# Patient Record
Sex: Male | Born: 1935 | Race: Black or African American | Hispanic: No | Smoking: Never smoker
Health system: Southern US, Community
[De-identification: ages and names within clinical notes are randomized; demographics above are authoritative.]

## PROBLEM LIST (undated history)

## (undated) ENCOUNTER — Emergency Department (HOSPITAL_COMMUNITY): Admission: EM | Payer: Self-pay | Source: Home / Self Care

## (undated) ENCOUNTER — Emergency Department (HOSPITAL_COMMUNITY): Payer: Non-veteran care | Source: Home / Self Care

## (undated) DIAGNOSIS — J189 Pneumonia, unspecified organism: Secondary | ICD-10-CM

## (undated) DIAGNOSIS — I509 Heart failure, unspecified: Secondary | ICD-10-CM

## (undated) DIAGNOSIS — E7800 Pure hypercholesterolemia, unspecified: Secondary | ICD-10-CM

## (undated) DIAGNOSIS — I10 Essential (primary) hypertension: Secondary | ICD-10-CM

## (undated) DIAGNOSIS — N200 Calculus of kidney: Secondary | ICD-10-CM

## (undated) DIAGNOSIS — C8590 Non-Hodgkin lymphoma, unspecified, unspecified site: Secondary | ICD-10-CM

## (undated) DIAGNOSIS — I499 Cardiac arrhythmia, unspecified: Secondary | ICD-10-CM

## (undated) HISTORY — PX: BACK SURGERY: SHX140

## (undated) HISTORY — DX: Pneumonia, unspecified organism: J18.9

## (undated) HISTORY — PX: HERNIA REPAIR: SHX51

## (undated) HISTORY — PX: JOINT REPLACEMENT: SHX530

## (undated) HISTORY — DX: Non-Hodgkin lymphoma, unspecified, unspecified site: C85.90

---

## 1992-09-01 HISTORY — PX: PARTIAL GASTRECTOMY: SHX2172

## 1998-10-04 ENCOUNTER — Encounter (HOSPITAL_BASED_OUTPATIENT_CLINIC_OR_DEPARTMENT_OTHER): Payer: Self-pay | Admitting: General Surgery

## 1998-10-09 ENCOUNTER — Inpatient Hospital Stay (HOSPITAL_COMMUNITY): Admission: RE | Admit: 1998-10-09 | Discharge: 1998-10-14 | Payer: Self-pay | Admitting: General Surgery

## 1998-10-11 ENCOUNTER — Encounter (HOSPITAL_BASED_OUTPATIENT_CLINIC_OR_DEPARTMENT_OTHER): Payer: Self-pay | Admitting: General Surgery

## 1999-05-29 ENCOUNTER — Emergency Department (HOSPITAL_COMMUNITY): Admission: EM | Admit: 1999-05-29 | Discharge: 1999-05-29 | Payer: Self-pay | Admitting: Emergency Medicine

## 1999-05-29 ENCOUNTER — Encounter: Payer: Self-pay | Admitting: Emergency Medicine

## 2000-02-17 ENCOUNTER — Encounter: Payer: Self-pay | Admitting: Emergency Medicine

## 2000-02-17 ENCOUNTER — Emergency Department (HOSPITAL_COMMUNITY): Admission: EM | Admit: 2000-02-17 | Discharge: 2000-02-17 | Payer: Self-pay | Admitting: Emergency Medicine

## 2002-01-06 ENCOUNTER — Ambulatory Visit (HOSPITAL_COMMUNITY): Admission: RE | Admit: 2002-01-06 | Discharge: 2002-01-06 | Payer: Self-pay | Admitting: Pulmonary Disease

## 2002-01-06 ENCOUNTER — Encounter: Payer: Self-pay | Admitting: Pulmonary Disease

## 2002-02-07 ENCOUNTER — Emergency Department (HOSPITAL_COMMUNITY): Admission: EM | Admit: 2002-02-07 | Discharge: 2002-02-07 | Payer: Self-pay

## 2002-03-01 ENCOUNTER — Ambulatory Visit (HOSPITAL_COMMUNITY): Admission: RE | Admit: 2002-03-01 | Discharge: 2002-03-01 | Payer: Self-pay | Admitting: Pulmonary Disease

## 2003-02-12 ENCOUNTER — Emergency Department (HOSPITAL_COMMUNITY): Admission: EM | Admit: 2003-02-12 | Discharge: 2003-02-12 | Payer: Self-pay | Admitting: Emergency Medicine

## 2003-05-02 ENCOUNTER — Ambulatory Visit (HOSPITAL_COMMUNITY): Admission: RE | Admit: 2003-05-02 | Discharge: 2003-05-02 | Payer: Self-pay | Admitting: General Surgery

## 2003-05-02 ENCOUNTER — Encounter (HOSPITAL_BASED_OUTPATIENT_CLINIC_OR_DEPARTMENT_OTHER): Payer: Self-pay | Admitting: General Surgery

## 2003-05-21 ENCOUNTER — Encounter (HOSPITAL_BASED_OUTPATIENT_CLINIC_OR_DEPARTMENT_OTHER): Payer: Self-pay | Admitting: General Surgery

## 2003-05-21 ENCOUNTER — Inpatient Hospital Stay (HOSPITAL_COMMUNITY): Admission: RE | Admit: 2003-05-21 | Discharge: 2003-05-28 | Payer: Self-pay | Admitting: General Surgery

## 2003-05-24 ENCOUNTER — Encounter (HOSPITAL_BASED_OUTPATIENT_CLINIC_OR_DEPARTMENT_OTHER): Payer: Self-pay | Admitting: General Surgery

## 2003-05-25 ENCOUNTER — Encounter (HOSPITAL_BASED_OUTPATIENT_CLINIC_OR_DEPARTMENT_OTHER): Payer: Self-pay | Admitting: General Surgery

## 2003-06-22 ENCOUNTER — Emergency Department (HOSPITAL_COMMUNITY): Admission: EM | Admit: 2003-06-22 | Discharge: 2003-06-22 | Payer: Self-pay | Admitting: Emergency Medicine

## 2003-07-04 ENCOUNTER — Emergency Department (HOSPITAL_COMMUNITY): Admission: EM | Admit: 2003-07-04 | Discharge: 2003-07-04 | Payer: Self-pay | Admitting: Emergency Medicine

## 2003-07-11 ENCOUNTER — Encounter: Admission: RE | Admit: 2003-07-11 | Discharge: 2003-08-03 | Payer: Self-pay

## 2003-07-23 ENCOUNTER — Emergency Department (HOSPITAL_COMMUNITY): Admission: AD | Admit: 2003-07-23 | Discharge: 2003-07-24 | Payer: Self-pay | Admitting: Emergency Medicine

## 2003-08-14 ENCOUNTER — Emergency Department (HOSPITAL_COMMUNITY): Admission: EM | Admit: 2003-08-14 | Discharge: 2003-08-14 | Payer: Self-pay | Admitting: Emergency Medicine

## 2003-08-14 ENCOUNTER — Emergency Department (HOSPITAL_COMMUNITY): Admission: EM | Admit: 2003-08-14 | Discharge: 2003-08-15 | Payer: Self-pay | Admitting: *Deleted

## 2003-08-20 ENCOUNTER — Ambulatory Visit (HOSPITAL_COMMUNITY): Admission: RE | Admit: 2003-08-20 | Discharge: 2003-08-20 | Payer: Self-pay | Admitting: *Deleted

## 2003-11-16 ENCOUNTER — Emergency Department (HOSPITAL_COMMUNITY): Admission: EM | Admit: 2003-11-16 | Discharge: 2003-11-16 | Payer: Self-pay | Admitting: Emergency Medicine

## 2003-11-17 ENCOUNTER — Ambulatory Visit (HOSPITAL_COMMUNITY): Admission: RE | Admit: 2003-11-17 | Discharge: 2003-11-17 | Payer: Self-pay | Admitting: Pulmonary Disease

## 2003-11-30 ENCOUNTER — Ambulatory Visit (HOSPITAL_COMMUNITY): Admission: RE | Admit: 2003-11-30 | Discharge: 2003-11-30 | Payer: Self-pay | Admitting: *Deleted

## 2003-11-30 ENCOUNTER — Encounter (INDEPENDENT_AMBULATORY_CARE_PROVIDER_SITE_OTHER): Payer: Self-pay | Admitting: *Deleted

## 2003-12-22 ENCOUNTER — Emergency Department (HOSPITAL_COMMUNITY): Admission: EM | Admit: 2003-12-22 | Discharge: 2003-12-22 | Payer: Self-pay | Admitting: Family Medicine

## 2004-01-20 ENCOUNTER — Inpatient Hospital Stay (HOSPITAL_COMMUNITY): Admission: EM | Admit: 2004-01-20 | Discharge: 2004-01-24 | Payer: Self-pay | Admitting: *Deleted

## 2004-02-04 ENCOUNTER — Emergency Department (HOSPITAL_COMMUNITY): Admission: EM | Admit: 2004-02-04 | Discharge: 2004-02-04 | Payer: Self-pay | Admitting: *Deleted

## 2004-02-18 ENCOUNTER — Emergency Department (HOSPITAL_COMMUNITY): Admission: EM | Admit: 2004-02-18 | Discharge: 2004-02-18 | Payer: Self-pay | Admitting: Emergency Medicine

## 2004-05-06 ENCOUNTER — Emergency Department (HOSPITAL_COMMUNITY): Admission: EM | Admit: 2004-05-06 | Discharge: 2004-05-06 | Payer: Self-pay | Admitting: Family Medicine

## 2004-05-11 ENCOUNTER — Encounter: Payer: Self-pay | Admitting: Emergency Medicine

## 2004-05-11 ENCOUNTER — Inpatient Hospital Stay (HOSPITAL_COMMUNITY): Admission: AD | Admit: 2004-05-11 | Discharge: 2004-05-13 | Payer: Self-pay | Admitting: Urology

## 2004-05-14 ENCOUNTER — Ambulatory Visit (HOSPITAL_COMMUNITY): Admission: RE | Admit: 2004-05-14 | Discharge: 2004-05-14 | Payer: Self-pay | Admitting: Pulmonary Disease

## 2004-05-27 ENCOUNTER — Ambulatory Visit (HOSPITAL_COMMUNITY): Admission: RE | Admit: 2004-05-27 | Discharge: 2004-05-27 | Payer: Self-pay | Admitting: Urology

## 2004-05-27 ENCOUNTER — Ambulatory Visit (HOSPITAL_BASED_OUTPATIENT_CLINIC_OR_DEPARTMENT_OTHER): Admission: RE | Admit: 2004-05-27 | Discharge: 2004-05-27 | Payer: Self-pay | Admitting: Urology

## 2004-08-10 ENCOUNTER — Emergency Department (HOSPITAL_COMMUNITY): Admission: EM | Admit: 2004-08-10 | Discharge: 2004-08-10 | Payer: Self-pay | Admitting: Family Medicine

## 2004-09-02 ENCOUNTER — Emergency Department (HOSPITAL_COMMUNITY): Admission: EM | Admit: 2004-09-02 | Discharge: 2004-09-03 | Payer: Self-pay | Admitting: Emergency Medicine

## 2004-09-03 ENCOUNTER — Emergency Department (HOSPITAL_COMMUNITY): Admission: EM | Admit: 2004-09-03 | Discharge: 2004-09-03 | Payer: Self-pay | Admitting: Family Medicine

## 2004-10-11 ENCOUNTER — Emergency Department (HOSPITAL_COMMUNITY): Admission: EM | Admit: 2004-10-11 | Discharge: 2004-10-11 | Payer: Self-pay | Admitting: Family Medicine

## 2004-10-30 ENCOUNTER — Ambulatory Visit (HOSPITAL_COMMUNITY): Admission: RE | Admit: 2004-10-30 | Discharge: 2004-10-30 | Payer: Self-pay | Admitting: Gastroenterology

## 2004-11-11 ENCOUNTER — Emergency Department (HOSPITAL_COMMUNITY): Admission: EM | Admit: 2004-11-11 | Discharge: 2004-11-11 | Payer: Self-pay | Admitting: Family Medicine

## 2004-11-17 ENCOUNTER — Emergency Department (HOSPITAL_COMMUNITY): Admission: EM | Admit: 2004-11-17 | Discharge: 2004-11-17 | Payer: Self-pay | Admitting: Emergency Medicine

## 2004-12-30 ENCOUNTER — Emergency Department (HOSPITAL_COMMUNITY): Admission: EM | Admit: 2004-12-30 | Discharge: 2004-12-30 | Payer: Self-pay | Admitting: Family Medicine

## 2005-03-04 ENCOUNTER — Emergency Department (HOSPITAL_COMMUNITY): Admission: EM | Admit: 2005-03-04 | Discharge: 2005-03-04 | Payer: Self-pay | Admitting: Emergency Medicine

## 2005-03-05 ENCOUNTER — Emergency Department (HOSPITAL_COMMUNITY): Admission: EM | Admit: 2005-03-05 | Discharge: 2005-03-05 | Payer: Self-pay | Admitting: Family Medicine

## 2005-03-10 ENCOUNTER — Emergency Department (HOSPITAL_COMMUNITY): Admission: EM | Admit: 2005-03-10 | Discharge: 2005-03-10 | Payer: Self-pay | Admitting: Emergency Medicine

## 2005-03-13 ENCOUNTER — Emergency Department (HOSPITAL_COMMUNITY): Admission: EM | Admit: 2005-03-13 | Discharge: 2005-03-13 | Payer: Self-pay | Admitting: Emergency Medicine

## 2005-04-04 ENCOUNTER — Encounter: Admission: RE | Admit: 2005-04-04 | Discharge: 2005-04-04 | Payer: Self-pay | Admitting: Orthopedic Surgery

## 2005-04-25 ENCOUNTER — Encounter: Admission: RE | Admit: 2005-04-25 | Discharge: 2005-04-25 | Payer: Self-pay | Admitting: Orthopedic Surgery

## 2005-05-16 ENCOUNTER — Emergency Department (HOSPITAL_COMMUNITY): Admission: EM | Admit: 2005-05-16 | Discharge: 2005-05-16 | Payer: Self-pay | Admitting: Emergency Medicine

## 2005-08-15 ENCOUNTER — Encounter: Admission: RE | Admit: 2005-08-15 | Discharge: 2005-08-15 | Payer: Self-pay | Admitting: Orthopedic Surgery

## 2005-09-05 ENCOUNTER — Emergency Department (HOSPITAL_COMMUNITY): Admission: EM | Admit: 2005-09-05 | Discharge: 2005-09-05 | Payer: Self-pay | Admitting: Family Medicine

## 2005-09-12 ENCOUNTER — Emergency Department (HOSPITAL_COMMUNITY): Admission: EM | Admit: 2005-09-12 | Discharge: 2005-09-12 | Payer: Self-pay | Admitting: *Deleted

## 2005-09-15 ENCOUNTER — Emergency Department (HOSPITAL_COMMUNITY): Admission: EM | Admit: 2005-09-15 | Discharge: 2005-09-15 | Payer: Self-pay | Admitting: Emergency Medicine

## 2005-09-16 ENCOUNTER — Emergency Department (HOSPITAL_COMMUNITY): Admission: EM | Admit: 2005-09-16 | Discharge: 2005-09-16 | Payer: Self-pay | Admitting: Family Medicine

## 2005-10-13 ENCOUNTER — Emergency Department (HOSPITAL_COMMUNITY): Admission: EM | Admit: 2005-10-13 | Discharge: 2005-10-13 | Payer: Self-pay | Admitting: Family Medicine

## 2005-10-18 ENCOUNTER — Emergency Department (HOSPITAL_COMMUNITY): Admission: EM | Admit: 2005-10-18 | Discharge: 2005-10-18 | Payer: Self-pay | Admitting: Emergency Medicine

## 2005-10-25 ENCOUNTER — Emergency Department (HOSPITAL_COMMUNITY): Admission: EM | Admit: 2005-10-25 | Discharge: 2005-10-25 | Payer: Self-pay | Admitting: Emergency Medicine

## 2005-10-31 ENCOUNTER — Emergency Department (HOSPITAL_COMMUNITY): Admission: EM | Admit: 2005-10-31 | Discharge: 2005-10-31 | Payer: Self-pay | Admitting: Family Medicine

## 2005-11-16 ENCOUNTER — Emergency Department (HOSPITAL_COMMUNITY): Admission: EM | Admit: 2005-11-16 | Discharge: 2005-11-16 | Payer: Self-pay | Admitting: Emergency Medicine

## 2005-11-17 ENCOUNTER — Encounter: Admission: RE | Admit: 2005-11-17 | Discharge: 2005-11-17 | Payer: Self-pay | Admitting: Orthopedic Surgery

## 2005-12-01 ENCOUNTER — Encounter: Admission: RE | Admit: 2005-12-01 | Discharge: 2005-12-01 | Payer: Self-pay | Admitting: Orthopedic Surgery

## 2005-12-15 ENCOUNTER — Encounter: Admission: RE | Admit: 2005-12-15 | Discharge: 2005-12-15 | Payer: Self-pay | Admitting: Orthopedic Surgery

## 2005-12-25 ENCOUNTER — Emergency Department (HOSPITAL_COMMUNITY): Admission: EM | Admit: 2005-12-25 | Discharge: 2005-12-25 | Payer: Self-pay | Admitting: Emergency Medicine

## 2006-01-17 ENCOUNTER — Emergency Department (HOSPITAL_COMMUNITY): Admission: EM | Admit: 2006-01-17 | Discharge: 2006-01-17 | Payer: Self-pay | Admitting: Family Medicine

## 2006-01-19 ENCOUNTER — Emergency Department (HOSPITAL_COMMUNITY): Admission: EM | Admit: 2006-01-19 | Discharge: 2006-01-19 | Payer: Self-pay | Admitting: Emergency Medicine

## 2006-01-29 ENCOUNTER — Ambulatory Visit (HOSPITAL_COMMUNITY): Admission: RE | Admit: 2006-01-29 | Discharge: 2006-01-29 | Payer: Self-pay | Admitting: Pulmonary Disease

## 2006-03-27 ENCOUNTER — Emergency Department (HOSPITAL_COMMUNITY): Admission: EM | Admit: 2006-03-27 | Discharge: 2006-03-27 | Payer: Self-pay | Admitting: Emergency Medicine

## 2006-03-28 ENCOUNTER — Emergency Department (HOSPITAL_COMMUNITY): Admission: EM | Admit: 2006-03-28 | Discharge: 2006-03-28 | Payer: Self-pay | Admitting: Emergency Medicine

## 2006-04-01 ENCOUNTER — Emergency Department (HOSPITAL_COMMUNITY): Admission: EM | Admit: 2006-04-01 | Discharge: 2006-04-01 | Payer: Self-pay | Admitting: Emergency Medicine

## 2006-04-11 ENCOUNTER — Emergency Department (HOSPITAL_COMMUNITY): Admission: EM | Admit: 2006-04-11 | Discharge: 2006-04-11 | Payer: Self-pay | Admitting: Family Medicine

## 2006-05-04 ENCOUNTER — Emergency Department (HOSPITAL_COMMUNITY): Admission: EM | Admit: 2006-05-04 | Discharge: 2006-05-04 | Payer: Self-pay | Admitting: Emergency Medicine

## 2006-05-21 ENCOUNTER — Encounter: Admission: RE | Admit: 2006-05-21 | Discharge: 2006-05-21 | Payer: Self-pay | Admitting: Family Medicine

## 2006-06-05 ENCOUNTER — Emergency Department (HOSPITAL_COMMUNITY): Admission: EM | Admit: 2006-06-05 | Discharge: 2006-06-05 | Payer: Self-pay | Admitting: Family Medicine

## 2006-06-13 ENCOUNTER — Emergency Department (HOSPITAL_COMMUNITY): Admission: EM | Admit: 2006-06-13 | Discharge: 2006-06-14 | Payer: Self-pay | Admitting: Emergency Medicine

## 2006-06-22 ENCOUNTER — Encounter: Admission: RE | Admit: 2006-06-22 | Discharge: 2006-06-22 | Payer: Self-pay | Admitting: Family Medicine

## 2006-06-30 ENCOUNTER — Emergency Department (HOSPITAL_COMMUNITY): Admission: EM | Admit: 2006-06-30 | Discharge: 2006-06-30 | Payer: Self-pay | Admitting: Emergency Medicine

## 2006-08-04 ENCOUNTER — Emergency Department (HOSPITAL_COMMUNITY): Admission: EM | Admit: 2006-08-04 | Discharge: 2006-08-04 | Payer: Self-pay | Admitting: Emergency Medicine

## 2006-08-08 ENCOUNTER — Emergency Department (HOSPITAL_COMMUNITY): Admission: EM | Admit: 2006-08-08 | Discharge: 2006-08-08 | Payer: Self-pay | Admitting: Emergency Medicine

## 2006-08-22 ENCOUNTER — Emergency Department (HOSPITAL_COMMUNITY): Admission: EM | Admit: 2006-08-22 | Discharge: 2006-08-22 | Payer: Self-pay | Admitting: Emergency Medicine

## 2006-08-25 ENCOUNTER — Emergency Department (HOSPITAL_COMMUNITY): Admission: EM | Admit: 2006-08-25 | Discharge: 2006-08-25 | Payer: Self-pay | Admitting: Emergency Medicine

## 2006-09-17 ENCOUNTER — Ambulatory Visit (HOSPITAL_COMMUNITY): Admission: RE | Admit: 2006-09-17 | Discharge: 2006-09-17 | Payer: Self-pay | Admitting: Gastroenterology

## 2006-10-05 ENCOUNTER — Emergency Department (HOSPITAL_COMMUNITY): Admission: EM | Admit: 2006-10-05 | Discharge: 2006-10-05 | Payer: Self-pay | Admitting: Family Medicine

## 2006-10-22 ENCOUNTER — Emergency Department (HOSPITAL_COMMUNITY): Admission: EM | Admit: 2006-10-22 | Discharge: 2006-10-22 | Payer: Self-pay | Admitting: Family Medicine

## 2006-10-28 ENCOUNTER — Emergency Department (HOSPITAL_COMMUNITY): Admission: EM | Admit: 2006-10-28 | Discharge: 2006-10-28 | Payer: Self-pay | Admitting: Family Medicine

## 2006-11-01 ENCOUNTER — Emergency Department (HOSPITAL_COMMUNITY): Admission: EM | Admit: 2006-11-01 | Discharge: 2006-11-01 | Payer: Self-pay | Admitting: Family Medicine

## 2006-11-10 ENCOUNTER — Emergency Department (HOSPITAL_COMMUNITY): Admission: EM | Admit: 2006-11-10 | Discharge: 2006-11-10 | Payer: Self-pay | Admitting: Emergency Medicine

## 2006-11-25 ENCOUNTER — Emergency Department (HOSPITAL_COMMUNITY): Admission: EM | Admit: 2006-11-25 | Discharge: 2006-11-25 | Payer: Self-pay | Admitting: *Deleted

## 2006-12-16 ENCOUNTER — Emergency Department (HOSPITAL_COMMUNITY): Admission: EM | Admit: 2006-12-16 | Discharge: 2006-12-16 | Payer: Self-pay | Admitting: Family Medicine

## 2007-01-17 ENCOUNTER — Emergency Department (HOSPITAL_COMMUNITY): Admission: EM | Admit: 2007-01-17 | Discharge: 2007-01-17 | Payer: Self-pay | Admitting: Family Medicine

## 2007-02-01 ENCOUNTER — Emergency Department (HOSPITAL_COMMUNITY): Admission: EM | Admit: 2007-02-01 | Discharge: 2007-02-01 | Payer: Self-pay | Admitting: Emergency Medicine

## 2007-02-12 ENCOUNTER — Emergency Department (HOSPITAL_COMMUNITY): Admission: EM | Admit: 2007-02-12 | Discharge: 2007-02-12 | Payer: Self-pay | Admitting: Family Medicine

## 2007-02-18 ENCOUNTER — Encounter: Admission: RE | Admit: 2007-02-18 | Discharge: 2007-02-18 | Payer: Self-pay | Admitting: Orthopedic Surgery

## 2007-03-09 ENCOUNTER — Encounter: Admission: RE | Admit: 2007-03-09 | Discharge: 2007-03-09 | Payer: Self-pay | Admitting: Orthopedic Surgery

## 2007-04-30 ENCOUNTER — Emergency Department (HOSPITAL_COMMUNITY): Admission: EM | Admit: 2007-04-30 | Discharge: 2007-04-30 | Payer: Self-pay | Admitting: Emergency Medicine

## 2007-05-19 ENCOUNTER — Ambulatory Visit (HOSPITAL_COMMUNITY): Admission: RE | Admit: 2007-05-19 | Discharge: 2007-05-19 | Payer: Self-pay | Admitting: Gastroenterology

## 2007-06-10 ENCOUNTER — Emergency Department (HOSPITAL_COMMUNITY): Admission: EM | Admit: 2007-06-10 | Discharge: 2007-06-10 | Payer: Self-pay | Admitting: Emergency Medicine

## 2007-06-12 ENCOUNTER — Emergency Department (HOSPITAL_COMMUNITY): Admission: EM | Admit: 2007-06-12 | Discharge: 2007-06-12 | Payer: Self-pay | Admitting: Emergency Medicine

## 2007-07-30 ENCOUNTER — Emergency Department (HOSPITAL_COMMUNITY): Admission: EM | Admit: 2007-07-30 | Discharge: 2007-07-31 | Payer: Self-pay | Admitting: Emergency Medicine

## 2007-08-19 ENCOUNTER — Emergency Department (HOSPITAL_COMMUNITY): Admission: EM | Admit: 2007-08-19 | Discharge: 2007-08-19 | Payer: Self-pay | Admitting: Emergency Medicine

## 2007-09-14 ENCOUNTER — Ambulatory Visit (HOSPITAL_COMMUNITY): Admission: RE | Admit: 2007-09-14 | Discharge: 2007-09-14 | Payer: Self-pay | Admitting: Gastroenterology

## 2007-10-02 ENCOUNTER — Emergency Department (HOSPITAL_COMMUNITY): Admission: EM | Admit: 2007-10-02 | Discharge: 2007-10-02 | Payer: Self-pay | Admitting: Emergency Medicine

## 2007-10-05 ENCOUNTER — Encounter: Admission: RE | Admit: 2007-10-05 | Discharge: 2007-10-05 | Payer: Self-pay | Admitting: Family Medicine

## 2007-10-06 ENCOUNTER — Encounter: Admission: RE | Admit: 2007-10-06 | Discharge: 2007-10-06 | Payer: Self-pay | Admitting: Family Medicine

## 2007-12-21 ENCOUNTER — Emergency Department (HOSPITAL_COMMUNITY): Admission: EM | Admit: 2007-12-21 | Discharge: 2007-12-21 | Payer: Self-pay | Admitting: Family Medicine

## 2008-01-03 ENCOUNTER — Emergency Department (HOSPITAL_COMMUNITY): Admission: EM | Admit: 2008-01-03 | Discharge: 2008-01-03 | Payer: Self-pay | Admitting: Emergency Medicine

## 2008-02-23 ENCOUNTER — Emergency Department (HOSPITAL_COMMUNITY): Admission: EM | Admit: 2008-02-23 | Discharge: 2008-02-23 | Payer: Self-pay | Admitting: Emergency Medicine

## 2008-03-03 ENCOUNTER — Emergency Department (HOSPITAL_COMMUNITY): Admission: EM | Admit: 2008-03-03 | Discharge: 2008-03-03 | Payer: Self-pay | Admitting: Emergency Medicine

## 2008-03-13 ENCOUNTER — Emergency Department (HOSPITAL_COMMUNITY): Admission: EM | Admit: 2008-03-13 | Discharge: 2008-03-14 | Payer: Self-pay | Admitting: Emergency Medicine

## 2008-05-21 ENCOUNTER — Emergency Department (HOSPITAL_COMMUNITY): Admission: EM | Admit: 2008-05-21 | Discharge: 2008-05-21 | Payer: Self-pay | Admitting: Emergency Medicine

## 2008-05-25 ENCOUNTER — Ambulatory Visit (HOSPITAL_COMMUNITY): Admission: AD | Admit: 2008-05-25 | Discharge: 2008-05-25 | Payer: Self-pay | Admitting: Gastroenterology

## 2008-06-14 ENCOUNTER — Ambulatory Visit (HOSPITAL_COMMUNITY): Admission: RE | Admit: 2008-06-14 | Discharge: 2008-06-14 | Payer: Self-pay | Admitting: Gastroenterology

## 2008-07-30 ENCOUNTER — Emergency Department (HOSPITAL_COMMUNITY): Admission: EM | Admit: 2008-07-30 | Discharge: 2008-07-30 | Payer: Self-pay | Admitting: Emergency Medicine

## 2008-08-13 ENCOUNTER — Emergency Department (HOSPITAL_COMMUNITY): Admission: EM | Admit: 2008-08-13 | Discharge: 2008-08-13 | Payer: Self-pay | Admitting: *Deleted

## 2008-09-22 ENCOUNTER — Emergency Department (HOSPITAL_COMMUNITY): Admission: EM | Admit: 2008-09-22 | Discharge: 2008-09-22 | Payer: Self-pay | Admitting: Emergency Medicine

## 2008-09-27 ENCOUNTER — Emergency Department (HOSPITAL_COMMUNITY): Admission: EM | Admit: 2008-09-27 | Discharge: 2008-09-27 | Payer: Self-pay | Admitting: Emergency Medicine

## 2009-01-18 ENCOUNTER — Ambulatory Visit (HOSPITAL_COMMUNITY): Admission: RE | Admit: 2009-01-18 | Discharge: 2009-01-18 | Payer: Self-pay | Admitting: Podiatry

## 2009-02-10 ENCOUNTER — Emergency Department (HOSPITAL_COMMUNITY): Admission: EM | Admit: 2009-02-10 | Discharge: 2009-02-10 | Payer: Self-pay | Admitting: Family Medicine

## 2009-02-24 ENCOUNTER — Emergency Department (HOSPITAL_COMMUNITY): Admission: EM | Admit: 2009-02-24 | Discharge: 2009-02-24 | Payer: Self-pay | Admitting: Emergency Medicine

## 2009-02-25 ENCOUNTER — Emergency Department (HOSPITAL_COMMUNITY): Admission: EM | Admit: 2009-02-25 | Discharge: 2009-02-25 | Payer: Self-pay | Admitting: Emergency Medicine

## 2009-06-09 ENCOUNTER — Emergency Department (HOSPITAL_COMMUNITY): Admission: EM | Admit: 2009-06-09 | Discharge: 2009-06-09 | Payer: Self-pay | Admitting: Family Medicine

## 2009-07-08 ENCOUNTER — Emergency Department (HOSPITAL_COMMUNITY): Admission: EM | Admit: 2009-07-08 | Discharge: 2009-07-08 | Payer: Self-pay | Admitting: Emergency Medicine

## 2009-07-11 ENCOUNTER — Encounter: Admission: RE | Admit: 2009-07-11 | Discharge: 2009-07-11 | Payer: Self-pay | Admitting: Orthopedic Surgery

## 2009-07-26 ENCOUNTER — Emergency Department (HOSPITAL_COMMUNITY): Admission: EM | Admit: 2009-07-26 | Discharge: 2009-07-26 | Payer: Self-pay | Admitting: Family Medicine

## 2009-07-29 ENCOUNTER — Emergency Department (HOSPITAL_COMMUNITY): Admission: EM | Admit: 2009-07-29 | Discharge: 2009-07-30 | Payer: Self-pay | Admitting: Emergency Medicine

## 2009-08-29 ENCOUNTER — Emergency Department (HOSPITAL_COMMUNITY): Admission: EM | Admit: 2009-08-29 | Discharge: 2009-08-30 | Payer: Self-pay | Admitting: Emergency Medicine

## 2009-09-04 ENCOUNTER — Emergency Department (HOSPITAL_COMMUNITY): Admission: EM | Admit: 2009-09-04 | Discharge: 2009-09-04 | Payer: Self-pay | Admitting: Emergency Medicine

## 2009-09-09 ENCOUNTER — Emergency Department (HOSPITAL_COMMUNITY): Admission: EM | Admit: 2009-09-09 | Discharge: 2009-09-09 | Payer: Self-pay | Admitting: Emergency Medicine

## 2009-09-28 ENCOUNTER — Emergency Department (HOSPITAL_COMMUNITY): Admission: EM | Admit: 2009-09-28 | Discharge: 2009-09-28 | Payer: Self-pay | Admitting: Emergency Medicine

## 2009-10-19 ENCOUNTER — Emergency Department (HOSPITAL_COMMUNITY): Admission: EM | Admit: 2009-10-19 | Discharge: 2009-10-19 | Payer: Self-pay | Admitting: Family Medicine

## 2009-10-23 ENCOUNTER — Encounter (HOSPITAL_BASED_OUTPATIENT_CLINIC_OR_DEPARTMENT_OTHER): Admission: RE | Admit: 2009-10-23 | Discharge: 2009-11-19 | Payer: Self-pay | Admitting: General Surgery

## 2009-10-31 ENCOUNTER — Emergency Department (HOSPITAL_COMMUNITY): Admission: EM | Admit: 2009-10-31 | Discharge: 2009-10-31 | Payer: Self-pay | Admitting: Emergency Medicine

## 2009-11-05 ENCOUNTER — Ambulatory Visit (HOSPITAL_COMMUNITY): Admission: RE | Admit: 2009-11-05 | Discharge: 2009-11-05 | Payer: Self-pay | Admitting: Gastroenterology

## 2009-11-16 ENCOUNTER — Ambulatory Visit: Payer: Self-pay | Admitting: Cardiology

## 2009-11-16 ENCOUNTER — Inpatient Hospital Stay (HOSPITAL_COMMUNITY): Admission: EM | Admit: 2009-11-16 | Discharge: 2009-11-17 | Payer: Self-pay | Admitting: Emergency Medicine

## 2009-11-16 ENCOUNTER — Encounter (INDEPENDENT_AMBULATORY_CARE_PROVIDER_SITE_OTHER): Payer: Self-pay | Admitting: Internal Medicine

## 2009-11-18 ENCOUNTER — Observation Stay (HOSPITAL_COMMUNITY): Admission: EM | Admit: 2009-11-18 | Discharge: 2009-11-20 | Payer: Self-pay | Admitting: Emergency Medicine

## 2009-11-24 ENCOUNTER — Emergency Department (HOSPITAL_COMMUNITY): Admission: EM | Admit: 2009-11-24 | Discharge: 2009-11-24 | Payer: Self-pay | Admitting: Family Medicine

## 2010-01-26 ENCOUNTER — Emergency Department (HOSPITAL_COMMUNITY): Admission: EM | Admit: 2010-01-26 | Discharge: 2010-01-26 | Payer: Self-pay | Admitting: Family Medicine

## 2010-04-01 ENCOUNTER — Emergency Department (HOSPITAL_COMMUNITY): Admission: EM | Admit: 2010-04-01 | Discharge: 2010-04-01 | Payer: Self-pay | Admitting: Family Medicine

## 2010-06-11 ENCOUNTER — Inpatient Hospital Stay (HOSPITAL_COMMUNITY): Admission: EM | Admit: 2010-06-11 | Discharge: 2010-06-13 | Payer: Self-pay | Admitting: Emergency Medicine

## 2010-09-22 ENCOUNTER — Encounter: Payer: Self-pay | Admitting: Pulmonary Disease

## 2010-10-25 ENCOUNTER — Inpatient Hospital Stay (INDEPENDENT_AMBULATORY_CARE_PROVIDER_SITE_OTHER)
Admission: RE | Admit: 2010-10-25 | Discharge: 2010-10-25 | Disposition: A | Discharge status: Home / Self Care | Payer: Medicare Other | Source: Ambulatory Visit | Attending: Family Medicine | Admitting: Family Medicine

## 2010-10-25 ENCOUNTER — Emergency Department (HOSPITAL_COMMUNITY): Payer: Medicare Other

## 2010-10-25 ENCOUNTER — Observation Stay (HOSPITAL_COMMUNITY)
Admission: EM | Admit: 2010-10-25 | Discharge: 2010-10-26 | DRG: 313 | Disposition: A | Discharge status: Home / Self Care | Payer: Medicare Other | Attending: Family Medicine | Admitting: Family Medicine

## 2010-10-25 DIAGNOSIS — R079 Chest pain, unspecified: Secondary | ICD-10-CM

## 2010-10-25 DIAGNOSIS — M109 Gout, unspecified: Secondary | ICD-10-CM | POA: Diagnosis present

## 2010-10-25 DIAGNOSIS — G8929 Other chronic pain: Secondary | ICD-10-CM | POA: Diagnosis present

## 2010-10-25 DIAGNOSIS — Z9181 History of falling: Secondary | ICD-10-CM

## 2010-10-25 DIAGNOSIS — N189 Chronic kidney disease, unspecified: Secondary | ICD-10-CM | POA: Diagnosis present

## 2010-10-25 DIAGNOSIS — I129 Hypertensive chronic kidney disease with stage 1 through stage 4 chronic kidney disease, or unspecified chronic kidney disease: Secondary | ICD-10-CM | POA: Diagnosis present

## 2010-10-25 DIAGNOSIS — E119 Type 2 diabetes mellitus without complications: Secondary | ICD-10-CM | POA: Diagnosis present

## 2010-10-25 DIAGNOSIS — Z7982 Long term (current) use of aspirin: Secondary | ICD-10-CM

## 2010-10-25 DIAGNOSIS — E785 Hyperlipidemia, unspecified: Secondary | ICD-10-CM | POA: Diagnosis present

## 2010-10-25 DIAGNOSIS — R0789 Other chest pain: Principal | ICD-10-CM | POA: Diagnosis present

## 2010-10-25 DIAGNOSIS — M1990 Unspecified osteoarthritis, unspecified site: Secondary | ICD-10-CM | POA: Diagnosis present

## 2010-10-25 LAB — BASIC METABOLIC PANEL
CO2: 23 mEq/L (ref 19–32)
Chloride: 100 mEq/L (ref 96–112)
GFR calc Af Amer: 44 mL/min — ABNORMAL LOW (ref >60)
Potassium: 4 mEq/L (ref 3.5–5.1)
Sodium: 132 mEq/L — ABNORMAL LOW (ref 135–145)

## 2010-10-25 LAB — POCT CARDIAC MARKERS: Troponin i, poc: <0.05 ng/mL (ref 0.00–0.09)

## 2010-10-25 LAB — DIFFERENTIAL
Eosinophils Relative: 3 % (ref 0–5)
Lymphocytes Relative: 38 % (ref 12–46)
Lymphs Abs: 1.9 10*3/uL (ref 0.7–4.0)
Monocytes Relative: 11 % (ref 3–12)
Neutrophils Relative %: 48 % (ref 43–77)

## 2010-10-25 LAB — CBC
HCT: 39 % (ref 39.0–52.0)
MCH: 32.7 pg (ref 26.0–34.0)
MCV: 94.4 fL (ref 78.0–100.0)
RBC: 4.13 MIL/uL — ABNORMAL LOW (ref 4.22–5.81)
WBC: 5 10*3/uL (ref 4.0–10.5)

## 2010-10-25 LAB — URINALYSIS, ROUTINE W REFLEX MICROSCOPIC
Hgb urine dipstick: NEGATIVE
Specific Gravity, Urine: 1.013 (ref 1.005–1.030)
Urine Glucose, Fasting: NEGATIVE mg/dL
Urobilinogen, UA: 0.2 mg/dL (ref 0.0–1.0)
pH: 5.5 (ref 5.0–8.0)

## 2010-10-25 LAB — URINE MICROSCOPIC-ADD ON

## 2010-10-26 ENCOUNTER — Encounter: Payer: Self-pay | Admitting: Family Medicine

## 2010-10-26 DIAGNOSIS — R0789 Other chest pain: Secondary | ICD-10-CM

## 2010-10-26 DIAGNOSIS — R1310 Dysphagia, unspecified: Secondary | ICD-10-CM | POA: Diagnosis present

## 2010-10-26 DIAGNOSIS — G8929 Other chronic pain: Secondary | ICD-10-CM | POA: Diagnosis present

## 2010-10-26 DIAGNOSIS — N183 Chronic kidney disease, stage 3 unspecified: Secondary | ICD-10-CM | POA: Diagnosis present

## 2010-10-26 DIAGNOSIS — M1990 Unspecified osteoarthritis, unspecified site: Secondary | ICD-10-CM | POA: Diagnosis present

## 2010-10-26 DIAGNOSIS — I10 Essential (primary) hypertension: Secondary | ICD-10-CM | POA: Diagnosis present

## 2010-10-26 DIAGNOSIS — M159 Polyosteoarthritis, unspecified: Secondary | ICD-10-CM

## 2010-10-26 DIAGNOSIS — M109 Gout, unspecified: Secondary | ICD-10-CM | POA: Diagnosis present

## 2010-10-26 DIAGNOSIS — E119 Type 2 diabetes mellitus without complications: Secondary | ICD-10-CM | POA: Diagnosis present

## 2010-10-26 DIAGNOSIS — D649 Anemia, unspecified: Secondary | ICD-10-CM

## 2010-10-26 DIAGNOSIS — M549 Dorsalgia, unspecified: Secondary | ICD-10-CM | POA: Diagnosis present

## 2010-10-26 LAB — BASIC METABOLIC PANEL
CO2: 24 mEq/L (ref 19–32)
Calcium: 9.1 mg/dL (ref 8.4–10.5)
Creatinine, Ser: 1.68 mg/dL — ABNORMAL HIGH (ref 0.4–1.5)
GFR calc Af Amer: 48 mL/min — ABNORMAL LOW (ref >60)
Sodium: 137 mEq/L (ref 135–145)

## 2010-10-26 LAB — GLUCOSE, CAPILLARY: Glucose-Capillary: 91 mg/dL (ref 70–99)

## 2010-10-26 LAB — CARDIAC PANEL(CRET KIN+CKTOT+MB+TROPI): Troponin I: 0.03 ng/mL (ref 0.00–0.06)

## 2010-10-26 LAB — TROPONIN I: Troponin I: 0.04 ng/mL (ref 0.00–0.06)

## 2010-10-26 LAB — CK TOTAL AND CKMB (NOT AT ARMC): Total CK: 267 U/L — ABNORMAL HIGH (ref 7–232)

## 2010-10-26 NOTE — H&P (Signed)
Family Medicine Teaching [facility] Admission History and Physical  Patient name: [name] Medical record number: [id number] Date of birth: 1932-02-12 Age: 77 y.o. Gender: male  Primary Care Provider: Mina Marble, MD at the [facility]  Chief Complaint: Chest Pain History of Present Illness: Larey Seat about 9 days ago, went to the [place] to be evaluated.  Was sent home with instructions to use cane and be careful.  Then, about three days ago began having some problems with pins and needles across both shoulders and down his left arm.  Called the [place] emergency line who told him to go to the hospital.  Micah Flesher to urgent care and they sent him to the ED.  Espouses burning pain in back, knees, both shoulders, and chest.  Says it is similar to his typical arthritis pain.  Patient Active Problem List  Diagnoses  . HTN (hypertension)  . DM (dermatomyositis)  . Anemia  . Arthritis  . Chronic back pain  . Gout  . CKD (chronic kidney disease) stage 3, GFR 30-59 ml/min  . Osteoarthritis  . Dysphagia, unspecified   Past Medical History: Past Medical History  Diagnosis Date  . Pneumonia   . Lymphoma     s/p partial gastrectomy    Past Surgical History: Past Surgical History  Procedure Date  . Partial gastrectomy 1994    for lymphoma    Social History: History   Social History  . Marital Status: Widowed    Spouse Name: N/A    Number of Children: N/A  . Years of Education: N/A   Social History Main Topics  . Smoking status: Never Smoker   . Smokeless tobacco: Never Used  . Alcohol Use: No  . Drug Use: No  . Sexually Active: No   Other Topics Concern  . Not on file   Social History Narrative   Widowed, lives alone.  Has family in the area.  Most of his medical care comes through the [place] in [place].    Family History: No family history on file.  Allergies: Allergies no known allergies  Current Outpatient Prescriptions  Medication Sig Dispense Refill  . allopurinol  (ZYLOPRIM) 100 MG tablet Take 100 mg by mouth daily.        Marland Kitchen aspirin 81 MG tablet Take 81 mg by mouth daily.        . carvedilol (COREG) 12.5 MG tablet Take 12.5 mg by mouth 2 (two) times daily with a meal.        . cyanocobalamin 100 MCG tablet Take 100 mcg by mouth daily.        . cyclobenzaprine (FLEXERIL) 10 MG tablet Take 10 mg by mouth 3 (three) times daily as needed.        . ergocalciferol (VITAMIN D2) 50000 UNITS capsule Take 50,000 Units by mouth every 14 (fourteen) days.        . ferrous sulfate 325 (65 FE) MG tablet Take 325 mg by mouth daily with breakfast.        . furosemide (LASIX) 20 MG tablet Take 20 mg by mouth daily.        Marland Kitchen glipiZIDE (GLUCOTROL) 10 MG tablet Take 10 mg by mouth daily.        Marland Kitchen HYDROcodone-acetaminophen (NORCO) 5-325 MG per tablet Take 1-2 tablets by mouth every 6 (six) hours as needed.        Marland Kitchen lisinopril (PRINIVIL,ZESTRIL) 20 MG tablet Take 20 mg by mouth daily.        Marland Kitchen  miconazole (MICOTIN) 2 % powder Apply topically as needed.        . niacin 500 MG tablet Take 500 mg by mouth 2 (two) times daily with a meal.        . pantoprazole (PROTONIX) 40 MG tablet Take 40 mg by mouth daily.        . pioglitazone (ACTOS) 30 MG tablet Take 30 mg by mouth daily.        . polyethylene glycol (MIRALAX / GLYCOLAX) packet Take 17 g by mouth daily.        . ranitidine (ZANTAC) 150 MG capsule Take 150 mg by mouth 2 (two) times daily.        Marland Kitchen senna (SENOKOT) 8.6 MG tablet Take 2 tablets by mouth 2 (two) times daily.        . sertraline (ZOLOFT) 100 MG tablet Take 200 mg by mouth daily.        . simvastatin (ZOCOR) 10 MG tablet Take 10 mg by mouth at bedtime.         Review Of Systems: Per HPI with the following additions: complains of pain, as previously stated, chronic foot drop in right foot, increased falls, chronic constipation but no n/v/d or grossly heme positive stools, no diaphoresis. Otherwise 12 point review of systems was performed and was  unremarkable.  Physical Exam:  General: NAD, interactive and appropriate HEENT: PERRL, EOMI, pharynx non-erythematous, MM mildly dry Heart: regular rate, no murmurs appreciated Lungs: Normal respiratory effort. Lungs CTABL, no crackles or wheezes. Abdomen: SNTND, no guarding.  Large left sided abdominal hernia, easily reducible.  Bowel sounds present.  Midline scar. Back: Midline scar in the lumbar spine region.  No other suspicious lesions noted Extremities: trace edema, wearing support hose.  No ulcerations noted on feet.  Pulses faint but palpable. ROM grossly preserved on the left LE, cannot raise foot on the right.  Left shoulder has decreased ROM secondary to pain from arthritis. Skin: No sores or suspicious lesions or rashes or color changes Neurology: Alert and oriented, CN II-XII grossly intact.  Some decreased strength in right leg and left hand.  Labs and Imaging: CBC: 5>13.5<196 BMET: 132/4/100/23/27/1.83<95 UA: remarkable only for 30 protein, spec-grav of 1.013 CXR: no acute findings CT Ab/pel: no acute findings, cholelithiasis, diverticulosis, stable bilateral renal cysts POC Trop: neg EKG: Prolonged PR with occasional dropped QRS, RBBB, some PVCs, rate in 70s   Assessment and Plan: 77 year old male with atypical/non-cardiac chest pain, acute on chronic renal failure, and a TIMI score of >2. 1. CP: Will do a standard cardiac rule-out with enzymes, EKG in the AM, and risk-stratification labs.  Do not feel this is likely to be cardiac in origin as the patient's story more fits with MSK causes. 2. History of Falls: Pt gives history of falls and, it would appear, a fall is likely the root cause of this admission.  Will have PT/OT evaluate for OP needs. 3. Renal failure: Will hydrate with NS and recheck Cr in AM.  Feel this is likely secondary to some dehydration.  Will hold lasix and lisinopril for now as well.  Encourage PO fluid intake. 4. DM: Will hold the patient's OP DM  meds for now and will give the patient a sliding scale. 5. Pain: Will give the patient his home vicoden with PRN Morphine IV for breakthrough.  Will also continue his home flexeril. 6. FEN/GI: Carb modified diet 7. Prophylaxis: Protonix, per home meds; SQ Heparin for VTE 8.  Disposition: Would plan for home after cardiac rule-out complete

## 2010-10-28 NOTE — H&P (Signed)
NAMEBRUCE, CHURILLA              ACCOUNT NO.:  [id number]  MEDICAL RECORD NO.:  [id number]           PATIENT TYPE:  LOCATION:                                 FACILITY:  PHYSICIAN:  Santiago Bumpers. Hensel, M.D.DATE OF BIRTH:  01-26-1932  DATE OF ADMISSION: DATE OF DISCHARGE:                             HISTORY & PHYSICAL   PRIMARY CARE PROVIDER:  Mina Marble, MD, at the [facility], in [place], [place].  CHIEF COMPLAINT:  Chest pain.  HISTORY OF PRESENT ILLNESS:  The patient is a 77 year old male who presents complaining of chest pain.  The patient fell approximately 9 days ago and went to the [facility] to evaluate.  He was sent home with instructions to use his cane and be careful while walking. Since his fall 9 days ago, the patient has been having significant issues with aches and pains throughout his entire body.  Then, approximately 3 days ago, the patient began having some problems with pins and needles like burning sensation across both of his shoulders and down his left arm.  This evening, the patient got tired of dealing with this and called the [facility] Emergency Line who instructed him to go to the hospital.  The patient went to urgent care and was sent from there to the emergency department.  The patient currently experiences some burning in his back, knees, both shoulders, chest, and in his left arm.  This pain is nonexertional, is not relieved by nitroglycerin, but was relieved by IV Dilaudid administered by the ED physician.  The patient says that this pain is very similar to his typical arthritis pain in character.  PAST MEDICAL HISTORY: 1. Hypertension. 2. Diabetes. 3. Anemia. 4. Arthritis. 5. Chronic back pain. 6. Gout. 7. Chronic kidney disease, stage III. 8. Osteoarthritis. 9. Unspecified dysphagia. 10.History of pneumonia. 11.History of lymphoma status post partial gastrectomy in 1994. 12.History of lumbar back  surgery for ruptured disk many years ago     resulting in right footdrop.  SOCIAL HISTORY:  The patient is widowed, has not smoked, does not drink and does not use recreational drugs.  The patient is not sexually active.  Lives alone but has family in the area.  Most of his medical care comes through the [facility] in [place], [place].  FAMILY HISTORY:  Noncontributory.  ALLERGIES:  No known drug allergies.  CURRENT MEDICATIONS: 1. Allopurinol 100 mg by mouth daily. 2. Aspirin 81 mg by mouth daily. 3. Coreg 12.5 mg by mouth twice daily. 4. Cyanocobalamin 100 mcg by mouth daily. 5. Flexeril 10 mg by mouth three times a day. 6. Ergocalciferol 50,000 units by mouth every 14 days. 7. Ferrous sulfate 325 mg by mouth daily. 8. Lasix 20 mg by mouth daily. 9. Glipizide 10 mg by mouth daily. 10.Norco 5/325 one to two tablets by mouth every 6 hours as needed for     pain. 11.Lisinopril 20 mg by mouth daily. 12.Miconazole powder, apply topically to groin for rash. 13.Niacin 500 mg by mouth two times daily with meal. 14.Protonix 40 mg by mouth daily. 15.Actos 30 mg by mouth daily. 16.MiraLax 17  g by mouth as needed for constipation. 17.Ranitidine 150 mg by mouth twice daily. 18.Senna 8.6 mg tablet, two tablets by mouth two times a day as     needed. 19.Sertraline 200 mg by mouth daily. 20.Simvastatin 10 mg by mouth daily.  REVIEW OF SYSTEMS:  Per HPI with the following additions:  Complains of pain, as previously stated, chronic footdrop in the right foot, increased falls, chronic constipation but no nausea, vomiting, diarrhea, or grossly heme-positive stools, no diaphoresis.  Otherwise, 12-point review of systems was performed and was unremarkable except for multiple pain complaints.  PHYSICAL EXAMINATION:  VITAL SIGNS:  From the emergency department were reviewed and were stable at the time of exam. GENERAL:  In no acute distress, interactive, and appropriate. HEENT:   Pupils equal, round, and reactive to light, extraocular movements intact.  Pharynx nonerythematous, mucous membranes mildly dry. HEART:  Regular rate, no murmurs appreciated. LUNGS:  Normal respiratory effort.  Lungs clear to auscultation bilaterally.  No crackles or wheezes. ABDOMEN:  Soft, nontender.  No guarding.  Large right-sided abdominal hernia, easily reducible.  Bowel sounds present.  Midline scar. BACK:  Midline scar in the lumbar spine region.  No other suspicious lesions noted. EXTREMITIES:  Trace edema, wearing support hose.  No ulcerations noted on feet.  Pulses faint but palpable.  Range of motion is grossly preserved on the left lower extremity, cannot raise right foot.  Left shoulder has decreased range of motion secondary to pain from arthritis. SKIN:  No sores or suspicious lesions or rashes or color changes. NEUROLOGY:  Alert and oriented, cranial nerves II through XII grossly intact.  Some decreased strength in the right leg and left hand.  LABORATORY DATA AND IMAGING:  CBC notable for white count of 5, hemoglobin 13.5.  BMET was relatively unremarkable except for an elevated creatinine of 1.83.  Urinalysis was remarkable only for 30 protein and specific gravity of 1.013. Chest x-ray demonstrated no acute findings. CT of the abdomen and pelvis demonstrated no acute findings, cholelithiasis, diverticulosis, and stable bilateral renal cysts. Point-of-care troponins were negative. EKG demonstrates a prolonged PR with occasional dropped QRS, right bundle-branch block with some PVCs, rate in the 70s.  ASSESSMENT AND PLAN:  This is a 77 year old male with atypical/noncardiac chest pain, acute-on-chronic renal failure, and a TIMI score of greater than 2. 1. Chest pain:  We will do a standard cardiac rule out with enzymes,     EKG in the morning, and risk stratification, and labs.  I feel that     this is likely to be cardiac in origin and the patient started it      more with musculoskeletal cause. 2. History of falls:  The patient gives a history of falls, and it     would appear, a fall is likely the root cause of this current     admission.  We will have PT and OT evaluate for outpatient needs. 3. Renal failure:  We will hydrate with normal saline and recheck     creatinine in the morning.  I feel this is likely secondary to some     dehydration.  We will also hold the patient's Lasix and lisinopril     for now.  Encourage good p.o. fluid intake. 4. Diabetes:  We will hold the patient's outpatient diabetes medicines     for now, and we will give the patient sliding scale. 5. Pain:  We will continue the patient's home Vicodin with p.r.n. IV  morphine for breakthrough.  We will continue the patient's home     Flexeril. 6. Fluids, electrolytes, and nutrition/gastrointestinal:  Carb-     modified diet. 7. Prophylaxis:  Protonix per home medications, subcu heparin for     venous thromboembolism. 8. Disposition:  We will plan for sending the patient home after     cardiac rule out is complete.    ______________________________ Majel Homer, MD   ______________________________ Santiago Bumpers. Leveda Anna, M.D.    ER/MEDQ  D:  10/26/2010  T:  10/26/2010  Job:  [id number]  Electronically Signed by Manuela Neptune MD on 10/28/2010 12:32:12 PM Electronically Signed by Doralee Albino M.D. on 10/28/2010 04:23:50 PM

## 2010-10-30 ENCOUNTER — Emergency Department (HOSPITAL_COMMUNITY)
Admission: EM | Admit: 2010-10-30 | Discharge: 2010-10-30 | Disposition: A | Discharge status: Home / Self Care | Payer: Medicare Other | Attending: Emergency Medicine | Admitting: Emergency Medicine

## 2010-10-30 DIAGNOSIS — Z79899 Other long term (current) drug therapy: Secondary | ICD-10-CM | POA: Insufficient documentation

## 2010-10-30 DIAGNOSIS — Z87898 Personal history of other specified conditions: Secondary | ICD-10-CM | POA: Insufficient documentation

## 2010-10-30 DIAGNOSIS — M545 Low back pain, unspecified: Secondary | ICD-10-CM | POA: Insufficient documentation

## 2010-10-30 DIAGNOSIS — M129 Arthropathy, unspecified: Secondary | ICD-10-CM | POA: Insufficient documentation

## 2010-10-30 DIAGNOSIS — I10 Essential (primary) hypertension: Secondary | ICD-10-CM | POA: Insufficient documentation

## 2010-10-30 DIAGNOSIS — G8929 Other chronic pain: Secondary | ICD-10-CM | POA: Insufficient documentation

## 2010-10-30 DIAGNOSIS — E119 Type 2 diabetes mellitus without complications: Secondary | ICD-10-CM | POA: Insufficient documentation

## 2010-10-31 ENCOUNTER — Emergency Department (HOSPITAL_COMMUNITY)
Admission: EM | Admit: 2010-10-31 | Discharge: 2010-10-31 | Disposition: A | Discharge status: Home / Self Care | Payer: Medicare Other | Attending: Emergency Medicine | Admitting: Emergency Medicine

## 2010-10-31 ENCOUNTER — Emergency Department (HOSPITAL_COMMUNITY): Payer: Medicare Other

## 2010-10-31 DIAGNOSIS — C8589 Other specified types of non-Hodgkin lymphoma, extranodal and solid organ sites: Secondary | ICD-10-CM | POA: Insufficient documentation

## 2010-10-31 DIAGNOSIS — S8000XA Contusion of unspecified knee, initial encounter: Secondary | ICD-10-CM | POA: Insufficient documentation

## 2010-10-31 DIAGNOSIS — M129 Arthropathy, unspecified: Secondary | ICD-10-CM | POA: Insufficient documentation

## 2010-10-31 DIAGNOSIS — R51 Headache: Secondary | ICD-10-CM | POA: Insufficient documentation

## 2010-10-31 DIAGNOSIS — S0003XA Contusion of scalp, initial encounter: Secondary | ICD-10-CM | POA: Insufficient documentation

## 2010-10-31 DIAGNOSIS — S0180XA Unspecified open wound of other part of head, initial encounter: Secondary | ICD-10-CM | POA: Insufficient documentation

## 2010-10-31 DIAGNOSIS — E119 Type 2 diabetes mellitus without complications: Secondary | ICD-10-CM | POA: Insufficient documentation

## 2010-10-31 DIAGNOSIS — W010XXA Fall on same level from slipping, tripping and stumbling without subsequent striking against object, initial encounter: Secondary | ICD-10-CM | POA: Insufficient documentation

## 2010-10-31 DIAGNOSIS — I10 Essential (primary) hypertension: Secondary | ICD-10-CM | POA: Insufficient documentation

## 2010-10-31 LAB — GLUCOSE, CAPILLARY: Glucose-Capillary: 82 mg/dL (ref 70–99)

## 2010-11-01 ENCOUNTER — Emergency Department (HOSPITAL_COMMUNITY)
Admission: EM | Admit: 2010-11-01 | Discharge: 2010-11-01 | Disposition: A | Discharge status: Home / Self Care | Payer: Medicare Other | Attending: Emergency Medicine | Admitting: Emergency Medicine

## 2010-11-01 DIAGNOSIS — I10 Essential (primary) hypertension: Secondary | ICD-10-CM | POA: Insufficient documentation

## 2010-11-01 DIAGNOSIS — M109 Gout, unspecified: Secondary | ICD-10-CM | POA: Insufficient documentation

## 2010-11-01 DIAGNOSIS — W19XXXA Unspecified fall, initial encounter: Secondary | ICD-10-CM | POA: Insufficient documentation

## 2010-11-01 DIAGNOSIS — H1130 Conjunctival hemorrhage, unspecified eye: Secondary | ICD-10-CM | POA: Insufficient documentation

## 2010-11-01 DIAGNOSIS — S0510XA Contusion of eyeball and orbital tissues, unspecified eye, initial encounter: Secondary | ICD-10-CM | POA: Insufficient documentation

## 2010-11-01 DIAGNOSIS — C8589 Other specified types of non-Hodgkin lymphoma, extranodal and solid organ sites: Secondary | ICD-10-CM | POA: Insufficient documentation

## 2010-11-01 DIAGNOSIS — E119 Type 2 diabetes mellitus without complications: Secondary | ICD-10-CM | POA: Insufficient documentation

## 2010-11-01 DIAGNOSIS — R51 Headache: Secondary | ICD-10-CM | POA: Insufficient documentation

## 2010-11-01 DIAGNOSIS — M129 Arthropathy, unspecified: Secondary | ICD-10-CM | POA: Insufficient documentation

## 2010-11-02 ENCOUNTER — Inpatient Hospital Stay (INDEPENDENT_AMBULATORY_CARE_PROVIDER_SITE_OTHER)
Admission: RE | Admit: 2010-11-02 | Discharge: 2010-11-02 | Disposition: A | Discharge status: Home / Self Care | Payer: Medicare Other | Source: Ambulatory Visit | Attending: Family Medicine | Admitting: Family Medicine

## 2010-11-02 DIAGNOSIS — H1130 Conjunctival hemorrhage, unspecified eye: Secondary | ICD-10-CM

## 2010-11-05 ENCOUNTER — Inpatient Hospital Stay (INDEPENDENT_AMBULATORY_CARE_PROVIDER_SITE_OTHER)
Admission: RE | Admit: 2010-11-05 | Discharge: 2010-11-05 | Disposition: A | Discharge status: Home / Self Care | Payer: Medicare Other | Source: Ambulatory Visit

## 2010-11-05 DIAGNOSIS — Z4802 Encounter for removal of sutures: Secondary | ICD-10-CM

## 2010-11-13 LAB — LIPID PANEL
HDL: 45 mg/dL (ref >39)
LDL Cholesterol: 53 mg/dL (ref 0–99)
Total CHOL/HDL Ratio: 2.4 RATIO
Triglycerides: 56 mg/dL (ref <150)
VLDL: 11 mg/dL (ref 0–40)

## 2010-11-13 LAB — CBC
HCT: 41.2 % (ref 39.0–52.0)
HCT: 41.4 % (ref 39.0–52.0)
MCH: 32.2 pg (ref 26.0–34.0)
MCHC: 33.7 g/dL (ref 30.0–36.0)
MCV: 95.6 fL (ref 78.0–100.0)
MCV: 96 fL (ref 78.0–100.0)
Platelets: 156 10*3/uL (ref 150–400)
Platelets: 162 10*3/uL (ref 150–400)
Platelets: 175 10*3/uL (ref 150–400)
RBC: 4.29 MIL/uL (ref 4.22–5.81)
RDW: 13.9 % (ref 11.5–15.5)
RDW: 14.1 % (ref 11.5–15.5)
WBC: 5.1 10*3/uL (ref 4.0–10.5)
WBC: 5.6 10*3/uL (ref 4.0–10.5)

## 2010-11-13 LAB — DIFFERENTIAL
Eosinophils Relative: 3 % (ref 0–5)
Lymphocytes Relative: 29 % (ref 12–46)
Lymphs Abs: 1.7 10*3/uL (ref 0.7–4.0)

## 2010-11-13 LAB — BASIC METABOLIC PANEL
BUN: 24 mg/dL — ABNORMAL HIGH (ref 6–23)
CO2: 21 mEq/L (ref 19–32)
Calcium: 8.8 mg/dL (ref 8.4–10.5)
Chloride: 106 mEq/L (ref 96–112)
Chloride: 106 mEq/L (ref 96–112)
Creatinine, Ser: 1.45 mg/dL (ref 0.4–1.5)
Creatinine, Ser: 1.93 mg/dL — ABNORMAL HIGH (ref 0.4–1.5)
GFR calc Af Amer: 41 mL/min — ABNORMAL LOW (ref >60)
GFR calc Af Amer: 57 mL/min — ABNORMAL LOW (ref >60)
GFR calc non Af Amer: 44 mL/min — ABNORMAL LOW (ref >60)
GFR calc non Af Amer: 47 mL/min — ABNORMAL LOW (ref >60)
Potassium: 4 mEq/L (ref 3.5–5.1)
Potassium: 4.2 mEq/L (ref 3.5–5.1)
Potassium: 4.3 mEq/L (ref 3.5–5.1)
Sodium: 134 mEq/L — ABNORMAL LOW (ref 135–145)

## 2010-11-13 LAB — CLOSTRIDIUM DIFFICILE EIA
C difficile Toxins A+B, EIA: NEGATIVE
C difficile Toxins A+B, EIA: NEGATIVE

## 2010-11-13 LAB — STOOL CULTURE

## 2010-11-13 LAB — COMPREHENSIVE METABOLIC PANEL
Albumin: 3.1 g/dL — ABNORMAL LOW (ref 3.5–5.2)
BUN: 30 mg/dL — ABNORMAL HIGH (ref 6–23)
Calcium: 8.7 mg/dL (ref 8.4–10.5)
Creatinine, Ser: 1.53 mg/dL — ABNORMAL HIGH (ref 0.4–1.5)
Total Bilirubin: 0.8 mg/dL (ref 0.3–1.2)
Total Protein: 6.2 g/dL (ref 6.0–8.3)

## 2010-11-13 LAB — CK TOTAL AND CKMB (NOT AT ARMC)
CK, MB: 5.6 ng/mL — ABNORMAL HIGH (ref 0.3–4.0)
Relative Index: 3.9 — ABNORMAL HIGH (ref 0.0–2.5)

## 2010-11-13 LAB — GLUCOSE, CAPILLARY
Glucose-Capillary: 111 mg/dL — ABNORMAL HIGH (ref 70–99)
Glucose-Capillary: 124 mg/dL — ABNORMAL HIGH (ref 70–99)
Glucose-Capillary: 179 mg/dL — ABNORMAL HIGH (ref 70–99)
Glucose-Capillary: 90 mg/dL (ref 70–99)

## 2010-11-13 LAB — URINALYSIS, ROUTINE W REFLEX MICROSCOPIC
Bilirubin Urine: NEGATIVE
Glucose, UA: 100 mg/dL — AB
Hgb urine dipstick: NEGATIVE
Specific Gravity, Urine: 1.017 (ref 1.005–1.030)
Urobilinogen, UA: 0.2 mg/dL (ref 0.0–1.0)
pH: 6 (ref 5.0–8.0)

## 2010-11-13 LAB — SEDIMENTATION RATE: Sed Rate: 2 mm/hr (ref 0–16)

## 2010-11-13 LAB — CK: Total CK: 174 U/L (ref 7–232)

## 2010-11-13 LAB — SODIUM, URINE, RANDOM: Sodium, Ur: 74 mEq/L

## 2010-11-13 LAB — CREATININE, URINE, RANDOM: Creatinine, Urine: 94.7 mg/dL

## 2010-11-13 LAB — CARDIAC PANEL(CRET KIN+CKTOT+MB+TROPI): Relative Index: 3.1 — ABNORMAL HIGH (ref 0.0–2.5)

## 2010-11-13 LAB — TROPONIN I: Troponin I: 0.03 ng/mL (ref 0.00–0.06)

## 2010-11-13 LAB — URINE MICROSCOPIC-ADD ON

## 2010-11-17 LAB — BASIC METABOLIC PANEL WITH GFR
BUN: 21 mg/dL (ref 6–23)
CO2: 28 meq/L (ref 19–32)
Calcium: 9 mg/dL (ref 8.4–10.5)
Chloride: 102 meq/L (ref 96–112)
Creatinine, Ser: 1.51 mg/dL — ABNORMAL HIGH (ref 0.4–1.5)
GFR calc non Af Amer: 45 mL/min — ABNORMAL LOW
Glucose, Bld: 144 mg/dL — ABNORMAL HIGH (ref 70–99)
Potassium: 4 meq/L (ref 3.5–5.1)
Sodium: 138 meq/L (ref 135–145)

## 2010-11-17 LAB — GLUCOSE, CAPILLARY
Glucose-Capillary: 176 mg/dL — ABNORMAL HIGH (ref 70–99)
Glucose-Capillary: 83 mg/dL (ref 70–99)

## 2010-11-18 LAB — POCT I-STAT, CHEM 8
Chloride: 104 mEq/L (ref 96–112)
Creatinine, Ser: 1.4 mg/dL (ref 0.4–1.5)
Hemoglobin: 15.3 g/dL (ref 13.0–17.0)
Potassium: 3.9 mEq/L (ref 3.5–5.1)
Sodium: 140 mEq/L (ref 135–145)

## 2010-11-18 NOTE — Discharge Summary (Signed)
[name], [name]              ACCOUNT NO.:  [id number]  MEDICAL RECORD NO.:  [id number]           PATIENT TYPE:  E  LOCATION:  [facility]                         FACILITY:  [facility]  PHYSICIAN:  Santiago Bumpers. Tayli Buch, M.D.DATE OF BIRTH:  07-Oct-1931  DATE OF ADMISSION:  10/25/2010 DATE OF DISCHARGE:  10/26/2010                              DISCHARGE SUMMARY   DISCHARGE DIAGNOSES: 1. Musculoskeletal chest pain. 2. Osteoarthritis. 3. Chronic pain. 4. Hypertension. 5. Hyperlipidemia. 6. Diabetes. 7. Chronic kidney disease. 8. Gout. 9. History of antrectomy for peptic ulcer disease.  DISCHARGE MEDICATIONS:  New medications:  None.  Home medications continued: 1. Acetaminophen 325 mg 1 tablet p.o. q.6 as needed for pain. 2. Actos 30 mg 1 tab p.o. q.a.m. 3. Aspirin 81 mg 1 tab p.o. q.a.m. 4. Carvedilol 12.5 mg 1 tab p.o. twice daily. 5. Cyclobenzaprine 10 mg 1 tab p.o. 4 times a day as needed for muscle    spasm. 6. Ferrous sulfate 325 mg 1 tab p.o. q.a.m. 7. Lasix 20 mg 1 tab p.o. q.a.m. 8. Glipizide 10 mg 1 tablet p.o. q.a.m. 9. Percocet 5/500 one tab p.o. t.i.d. as needed for pain. 10.Lisinopril 10 mg p.o. daily. 11.MiraLax 17 g p.o. q.a.m. 12.Niacin 500 mg 2 tabs p.o. daily at bedtime. 13.Ranitidine 150 mg 1 tab p.o. twice daily. 14.Sennosides 8.6 mg 2 tabs p.o. twice daily. 15.Sertraline 100 mg 2 tabs p.o. daily at bedtime. 16.Simvastatin 20 mg half tab p.o. daily at bedtime. 17.Vitamin D2 of 50,000 units 1 capsule p.o. every 2 weeks. 18.Herbal supplement 1 tab p.o. q.a.m.  PERTINENT LABORATORY DATA AT DISCHARGE:  The patient's cardiac enzymes negative x2 sets.  The patient's creatinine was 1.68.  His UA and urine micro negative.  PERTINENT STUDIES AT DISCHARGE:  Repeat EKG shows sinus rhythm, first- degree AV block.  CONSULTS:  None.  BRIEF HOSPITAL COURSE:  The patient is a 77 year old male admitted for chest pain, rule out acute MI. Chest pain.  On further  questioning, it was determined that the patient's chest pain was just one of multiple pains all over his body. He recently had a fall and started having pain in his chest as well as his bilateral shoulders and left arm.  During this hospital admission, cardiac enzymes were cycled and they were negative.  EKG initially showed first-degree AV block and repeat EKG showed the same.  The patient's chest pain was improved by the morning of October 26, 2010 and it was determined that he was not having  acute MI and he was therefore ruled out and was stable for discharge.  The patient was continued on his home medications.  No medications were changed.  DISCHARGE INSTRUCTIONS:  The patient was discharged to home with instructions to follow up with his doctor in [place].  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  He was instructed to increase activity slowly and to consume a heart-healthy diet and no wound care instructions were necessary.  FOLLOWUP ISSUES AND RECOMMENDATIONS:  None.  DISCHARGE CONDITION:  The patient was discharged to home in stable medical condition.    ______________________________ Gerilyn Nestle  Funches, MD   ______________________________ Santiago Bumpers Leveda Anna, M.D.    JF/MEDQ  D:  10/26/2010  T:  10/27/2010  Job:  [id number]  Electronically Signed by Dessa Phi MD on 11/13/2010 04:20:52 PM Electronically Signed by Doralee Albino M.D. on 11/18/2010 02:09:53 PM

## 2010-11-24 LAB — CBC
HCT: 35 % — ABNORMAL LOW (ref 39.0–52.0)
HCT: 35 % — ABNORMAL LOW (ref 39.0–52.0)
HCT: 39.1 % (ref 39.0–52.0)
Hemoglobin: 11.7 g/dL — ABNORMAL LOW (ref 13.0–17.0)
Hemoglobin: 11.7 g/dL — ABNORMAL LOW (ref 13.0–17.0)
Hemoglobin: 12.1 g/dL — ABNORMAL LOW (ref 13.0–17.0)
Hemoglobin: 13.3 g/dL (ref 13.0–17.0)
MCHC: 33.3 g/dL (ref 30.0–36.0)
MCHC: 33.3 g/dL (ref 30.0–36.0)
MCHC: 33.5 g/dL (ref 30.0–36.0)
MCHC: 34 g/dL (ref 30.0–36.0)
MCHC: 34.2 g/dL (ref 30.0–36.0)
MCHC: 34.6 g/dL (ref 30.0–36.0)
MCV: 98.9 fL (ref 78.0–100.0)
MCV: 99.3 fL (ref 78.0–100.0)
MCV: 99.4 fL (ref 78.0–100.0)
Platelets: 212 K/uL (ref 150–400)
Platelets: 213 10*3/uL (ref 150–400)
Platelets: 216 10*3/uL (ref 150–400)
Platelets: 218 K/uL (ref 150–400)
Platelets: 228 10*3/uL (ref 150–400)
RBC: 3.52 MIL/uL — ABNORMAL LOW (ref 4.22–5.81)
RBC: 3.53 MIL/uL — ABNORMAL LOW (ref 4.22–5.81)
RBC: 3.54 MIL/uL — ABNORMAL LOW (ref 4.22–5.81)
RDW: 13.5 % (ref 11.5–15.5)
RDW: 13.5 % (ref 11.5–15.5)
RDW: 13.6 % (ref 11.5–15.5)
RDW: 13.7 % (ref 11.5–15.5)
RDW: 13.8 % (ref 11.5–15.5)
WBC: 3.8 10*3/uL — ABNORMAL LOW (ref 4.0–10.5)
WBC: 4.4 K/uL (ref 4.0–10.5)

## 2010-11-24 LAB — BASIC METABOLIC PANEL
BUN: 12 mg/dL (ref 6–23)
BUN: 14 mg/dL (ref 6–23)
CO2: 27 mEq/L (ref 19–32)
CO2: 29 mEq/L (ref 19–32)
Calcium: 9.2 mg/dL (ref 8.4–10.5)
Calcium: 9.4 mg/dL (ref 8.4–10.5)
Chloride: 107 mEq/L (ref 96–112)
Chloride: 109 mEq/L (ref 96–112)
Creatinine, Ser: 1.34 mg/dL (ref 0.4–1.5)
Creatinine, Ser: 1.44 mg/dL (ref 0.4–1.5)
GFR calc Af Amer: 58 mL/min — ABNORMAL LOW (ref >60)
GFR calc Af Amer: >60 mL/min (ref >60)
GFR calc Af Amer: >60 mL/min (ref >60)
GFR calc non Af Amer: 53 mL/min — ABNORMAL LOW (ref >60)
Glucose, Bld: 100 mg/dL — ABNORMAL HIGH (ref 70–99)
Glucose, Bld: 96 mg/dL (ref 70–99)
Potassium: 3.1 mEq/L — ABNORMAL LOW (ref 3.5–5.1)

## 2010-11-24 LAB — DIFFERENTIAL
Basophils Absolute: 0 K/uL (ref 0.0–0.1)
Basophils Absolute: 0 K/uL (ref 0.0–0.1)
Basophils Relative: 0 % (ref 0–1)
Basophils Relative: 0 % (ref 0–1)
Basophils Relative: 0 % (ref 0–1)
Eosinophils Absolute: 0.1 10*3/uL (ref 0.0–0.7)
Eosinophils Absolute: 0.1 10*3/uL (ref 0.0–0.7)
Eosinophils Relative: 2 % (ref 0–5)
Eosinophils Relative: 3 % (ref 0–5)
Lymphocytes Relative: 29 % (ref 12–46)
Lymphocytes Relative: 32 % (ref 12–46)
Lymphocytes Relative: 33 % (ref 12–46)
Lymphs Abs: 1.2 10*3/uL (ref 0.7–4.0)
Lymphs Abs: 1.3 10*3/uL (ref 0.7–4.0)
Lymphs Abs: 1.4 10*3/uL (ref 0.7–4.0)
Lymphs Abs: 1.5 10*3/uL (ref 0.7–4.0)
Monocytes Absolute: 0.4 K/uL (ref 0.1–1.0)
Monocytes Absolute: 0.5 10*3/uL (ref 0.1–1.0)
Monocytes Absolute: 0.7 10*3/uL (ref 0.1–1.0)
Monocytes Relative: 10 % (ref 3–12)
Monocytes Relative: 11 % (ref 3–12)
Monocytes Relative: 12 % (ref 3–12)
Monocytes Relative: 13 % — ABNORMAL HIGH (ref 3–12)
Neutro Abs: 2.1 10*3/uL (ref 1.7–7.7)
Neutro Abs: 2.1 K/uL (ref 1.7–7.7)
Neutro Abs: 2.4 K/uL (ref 1.7–7.7)
Neutro Abs: 2.8 10*3/uL (ref 1.7–7.7)
Neutrophils Relative %: 53 % (ref 43–77)
Neutrophils Relative %: 54 % (ref 43–77)
Neutrophils Relative %: 55 % (ref 43–77)
Neutrophils Relative %: 55 % (ref 43–77)

## 2010-11-24 LAB — COMPREHENSIVE METABOLIC PANEL
ALT: 25 U/L (ref 0–53)
ALT: 31 U/L (ref 0–53)
AST: 33 U/L (ref 0–37)
AST: 38 U/L — ABNORMAL HIGH (ref 0–37)
Albumin: 3.4 g/dL — ABNORMAL LOW (ref 3.5–5.2)
Albumin: 3.5 g/dL (ref 3.5–5.2)
Alkaline Phosphatase: 131 U/L — ABNORMAL HIGH (ref 39–117)
BUN: 14 mg/dL (ref 6–23)
CO2: 25 mEq/L (ref 19–32)
CO2: 26 mEq/L (ref 19–32)
Calcium: 8.7 mg/dL (ref 8.4–10.5)
Calcium: 8.8 mg/dL (ref 8.4–10.5)
Calcium: 9.2 mg/dL (ref 8.4–10.5)
GFR calc Af Amer: 49 mL/min — ABNORMAL LOW (ref >60)
GFR calc Af Amer: 49 mL/min — ABNORMAL LOW (ref >60)
GFR calc non Af Amer: 41 mL/min — ABNORMAL LOW (ref >60)
Glucose, Bld: 106 mg/dL — ABNORMAL HIGH (ref 70–99)
Glucose, Bld: 79 mg/dL (ref 70–99)
Potassium: 3.5 mEq/L (ref 3.5–5.1)
Sodium: 137 mEq/L (ref 135–145)
Sodium: 137 mEq/L (ref 135–145)
Sodium: 138 mEq/L (ref 135–145)
Total Protein: 6.6 g/dL (ref 6.0–8.3)
Total Protein: 6.7 g/dL (ref 6.0–8.3)
Total Protein: 7 g/dL (ref 6.0–8.3)

## 2010-11-24 LAB — URINALYSIS, ROUTINE W REFLEX MICROSCOPIC
Bilirubin Urine: NEGATIVE
Bilirubin Urine: NEGATIVE
Bilirubin Urine: NEGATIVE
Glucose, UA: NEGATIVE mg/dL
Glucose, UA: NEGATIVE mg/dL
Hgb urine dipstick: NEGATIVE
Hgb urine dipstick: NEGATIVE
Ketones, ur: NEGATIVE mg/dL
Ketones, ur: NEGATIVE mg/dL
Leukocytes, UA: NEGATIVE
Leukocytes, UA: NEGATIVE
Nitrite: NEGATIVE
Nitrite: NEGATIVE
Nitrite: NEGATIVE
Protein, ur: 100 mg/dL — AB
Protein, ur: 100 mg/dL — AB
Protein, ur: 30 mg/dL — AB
Specific Gravity, Urine: 1.011 (ref 1.005–1.030)
Specific Gravity, Urine: 1.012 (ref 1.005–1.030)
Specific Gravity, Urine: 1.014 (ref 1.005–1.030)
Urobilinogen, UA: 1 mg/dL (ref 0.0–1.0)
Urobilinogen, UA: 1 mg/dL (ref 0.0–1.0)
pH: 6 (ref 5.0–8.0)
pH: 6 (ref 5.0–8.0)

## 2010-11-24 LAB — PROTIME-INR: INR: 1.2 (ref 0.00–1.49)

## 2010-11-24 LAB — CLOSTRIDIUM DIFFICILE EIA

## 2010-11-24 LAB — GLUCOSE, CAPILLARY
Glucose-Capillary: 104 mg/dL — ABNORMAL HIGH (ref 70–99)
Glucose-Capillary: 108 mg/dL — ABNORMAL HIGH (ref 70–99)
Glucose-Capillary: 116 mg/dL — ABNORMAL HIGH (ref 70–99)
Glucose-Capillary: 131 mg/dL — ABNORMAL HIGH (ref 70–99)
Glucose-Capillary: 134 mg/dL — ABNORMAL HIGH (ref 70–99)
Glucose-Capillary: 154 mg/dL — ABNORMAL HIGH (ref 70–99)
Glucose-Capillary: 177 mg/dL — ABNORMAL HIGH (ref 70–99)
Glucose-Capillary: 180 mg/dL — ABNORMAL HIGH (ref 70–99)
Glucose-Capillary: 64 mg/dL — ABNORMAL LOW (ref 70–99)
Glucose-Capillary: 80 mg/dL (ref 70–99)
Glucose-Capillary: 85 mg/dL (ref 70–99)
Glucose-Capillary: 93 mg/dL (ref 70–99)
Glucose-Capillary: 99 mg/dL (ref 70–99)

## 2010-11-24 LAB — COMPREHENSIVE METABOLIC PANEL WITH GFR
Alkaline Phosphatase: 137 U/L — ABNORMAL HIGH (ref 39–117)
BUN: 21 mg/dL (ref 6–23)
Chloride: 107 meq/L (ref 96–112)
Creatinine, Ser: 1.65 mg/dL — ABNORMAL HIGH (ref 0.4–1.5)
GFR calc non Af Amer: 41 mL/min — ABNORMAL LOW (ref >60)
Glucose, Bld: 92 mg/dL (ref 70–99)
Potassium: 3.3 meq/L — ABNORMAL LOW (ref 3.5–5.1)
Total Bilirubin: 0.8 mg/dL (ref 0.3–1.2)

## 2010-11-24 LAB — POCT CARDIAC MARKERS
CKMB, poc: 3.9 ng/mL (ref 1.0–8.0)
Myoglobin, poc: 174 ng/mL (ref 12–200)
Myoglobin, poc: 221 ng/mL (ref 12–200)
Troponin i, poc: <0.05 ng/mL (ref 0.00–0.09)

## 2010-11-24 LAB — URINE CULTURE

## 2010-11-24 LAB — BASIC METABOLIC PANEL WITH GFR
BUN: 17 mg/dL (ref 6–23)
CO2: 25 meq/L (ref 19–32)
Calcium: 8.5 mg/dL (ref 8.4–10.5)
Creatinine, Ser: 1.32 mg/dL (ref 0.4–1.5)
Glucose, Bld: 98 mg/dL (ref 70–99)
Sodium: 138 meq/L (ref 135–145)

## 2010-11-24 LAB — LIPID PANEL
Cholesterol: 96 mg/dL (ref 0–200)
HDL: 31 mg/dL — ABNORMAL LOW (ref >39)
LDL Cholesterol: 47 mg/dL (ref 0–99)
Total CHOL/HDL Ratio: 3.1 RATIO
Triglycerides: 88 mg/dL (ref <150)
VLDL: 18 mg/dL (ref 0–40)

## 2010-11-24 LAB — URINE MICROSCOPIC-ADD ON

## 2010-11-24 LAB — LIPASE, BLOOD
Lipase: 30 U/L (ref 11–59)
Lipase: 40 U/L (ref 11–59)

## 2010-11-24 LAB — HEMOGLOBIN A1C
Hgb A1c MFr Bld: 6.9 % — ABNORMAL HIGH (ref 4.6–6.1)
Mean Plasma Glucose: 151 mg/dL

## 2010-11-24 LAB — APTT: aPTT: 34 seconds (ref 24–37)

## 2010-11-24 LAB — T4, FREE: Free T4: 1.18 ng/dL (ref 0.80–1.80)

## 2010-11-24 LAB — MAGNESIUM
Magnesium: 1.8 mg/dL (ref 1.5–2.5)
Magnesium: 2 mg/dL (ref 1.5–2.5)

## 2010-11-24 LAB — TSH: TSH: 1.624 u[IU]/mL (ref 0.350–4.500)

## 2010-12-02 LAB — URINE MICROSCOPIC-ADD ON

## 2010-12-02 LAB — URINALYSIS, ROUTINE W REFLEX MICROSCOPIC
Leukocytes, UA: NEGATIVE
Nitrite: NEGATIVE
Specific Gravity, Urine: 1.016 (ref 1.005–1.030)
pH: 6 (ref 5.0–8.0)

## 2010-12-02 LAB — BASIC METABOLIC PANEL
BUN: 32 mg/dL — ABNORMAL HIGH (ref 6–23)
Chloride: 105 mEq/L (ref 96–112)
Creatinine, Ser: 1.51 mg/dL — ABNORMAL HIGH (ref 0.4–1.5)
GFR calc non Af Amer: 45 mL/min — ABNORMAL LOW (ref >60)
Glucose, Bld: 68 mg/dL — ABNORMAL LOW (ref 70–99)

## 2010-12-02 LAB — CBC
MCV: 98.1 fL (ref 78.0–100.0)
Platelets: 191 10*3/uL (ref 150–400)
RDW: 15.3 % (ref 11.5–15.5)
WBC: 5.1 10*3/uL (ref 4.0–10.5)

## 2010-12-02 LAB — GLUCOSE, CAPILLARY: Glucose-Capillary: 53 mg/dL — ABNORMAL LOW (ref 70–99)

## 2010-12-02 LAB — DIFFERENTIAL
Basophils Absolute: 0 10*3/uL (ref 0.0–0.1)
Basophils Relative: 0 % (ref 0–1)
Eosinophils Absolute: 0.1 10*3/uL (ref 0.0–0.7)
Lymphs Abs: 1.2 10*3/uL (ref 0.7–4.0)
Neutrophils Relative %: 65 % (ref 43–77)

## 2010-12-02 LAB — POCT CARDIAC MARKERS: Troponin i, poc: <0.05 ng/mL (ref 0.00–0.09)

## 2010-12-04 LAB — COMPREHENSIVE METABOLIC PANEL
BUN: 35 mg/dL — ABNORMAL HIGH (ref 6–23)
CO2: 27 mEq/L (ref 19–32)
Calcium: 8.5 mg/dL (ref 8.4–10.5)
Creatinine, Ser: 1.88 mg/dL — ABNORMAL HIGH (ref 0.4–1.5)
GFR calc non Af Amer: 35 mL/min — ABNORMAL LOW (ref >60)
Glucose, Bld: 96 mg/dL (ref 70–99)
Total Protein: 6.1 g/dL (ref 6.0–8.3)

## 2010-12-04 LAB — CBC
HCT: 40.2 % (ref 39.0–52.0)
Hemoglobin: 13.7 g/dL (ref 13.0–17.0)
MCHC: 34 g/dL (ref 30.0–36.0)
MCV: 99.3 fL (ref 78.0–100.0)
RBC: 4.05 MIL/uL — ABNORMAL LOW (ref 4.22–5.81)
RDW: 15.1 % (ref 11.5–15.5)

## 2010-12-04 LAB — DIFFERENTIAL
Eosinophils Absolute: 0 10*3/uL (ref 0.0–0.7)
Lymphs Abs: 0.7 10*3/uL (ref 0.7–4.0)
Monocytes Relative: 10 % (ref 3–12)
Neutro Abs: 4.8 10*3/uL (ref 1.7–7.7)
Neutrophils Relative %: 78 % — ABNORMAL HIGH (ref 43–77)

## 2010-12-04 LAB — LIPASE, BLOOD: Lipase: 63 U/L — ABNORMAL HIGH (ref 11–59)

## 2010-12-09 LAB — POCT I-STAT, CHEM 8
BUN: 30 mg/dL — ABNORMAL HIGH (ref 6–23)
BUN: 32 mg/dL — ABNORMAL HIGH (ref 6–23)
Chloride: 106 mEq/L (ref 96–112)
Creatinine, Ser: 2 mg/dL — ABNORMAL HIGH (ref 0.4–1.5)
HCT: 40 % (ref 39.0–52.0)
Hemoglobin: 13.9 g/dL (ref 13.0–17.0)
Potassium: 3.6 mEq/L (ref 3.5–5.1)
Potassium: 3.9 mEq/L (ref 3.5–5.1)
Sodium: 136 mEq/L (ref 135–145)
TCO2: 22 mmol/L (ref 0–100)

## 2010-12-09 LAB — GLUCOSE, CAPILLARY: Glucose-Capillary: 70 mg/dL (ref 70–99)

## 2010-12-09 LAB — POCT CARDIAC MARKERS
CKMB, poc: 2.4 ng/mL (ref 1.0–8.0)
CKMB, poc: 3 ng/mL (ref 1.0–8.0)
CKMB, poc: 4.1 ng/mL (ref 1.0–8.0)
Troponin i, poc: <0.05 ng/mL (ref 0.00–0.09)
Troponin i, poc: <0.05 ng/mL (ref 0.00–0.09)
Troponin i, poc: <0.05 ng/mL (ref 0.00–0.09)

## 2010-12-16 LAB — POCT I-STAT, CHEM 8
Calcium, Ion: 1.2 mmol/L (ref 1.12–1.32)
Creatinine, Ser: 1.7 mg/dL — ABNORMAL HIGH (ref 0.4–1.5)
Glucose, Bld: 116 mg/dL — ABNORMAL HIGH (ref 70–99)
HCT: 44 % (ref 39.0–52.0)
Hemoglobin: 15 g/dL (ref 13.0–17.0)

## 2010-12-16 LAB — CBC
HCT: 41.6 % (ref 39.0–52.0)
Hemoglobin: 14.2 g/dL (ref 13.0–17.0)
MCHC: 34 g/dL (ref 30.0–36.0)
MCV: 93.7 fL (ref 78.0–100.0)
RBC: 4.44 MIL/uL (ref 4.22–5.81)
WBC: 5.9 10*3/uL (ref 4.0–10.5)

## 2010-12-16 LAB — DIFFERENTIAL
Basophils Relative: 1 % (ref 0–1)
Eosinophils Absolute: 0.2 10*3/uL (ref 0.0–0.7)
Monocytes Absolute: 0.7 10*3/uL (ref 0.1–1.0)
Monocytes Relative: 11 % (ref 3–12)

## 2011-01-08 ENCOUNTER — Emergency Department (HOSPITAL_COMMUNITY)
Admission: EM | Admit: 2011-01-08 | Discharge: 2011-01-09 | Disposition: A | Discharge status: Home / Self Care | Payer: Medicare Other | Attending: Emergency Medicine | Admitting: Emergency Medicine

## 2011-01-08 ENCOUNTER — Inpatient Hospital Stay (INDEPENDENT_AMBULATORY_CARE_PROVIDER_SITE_OTHER)
Admission: RE | Admit: 2011-01-08 | Discharge: 2011-01-08 | Disposition: A | Discharge status: Home / Self Care | Payer: Medicare Other | Source: Ambulatory Visit | Attending: Family Medicine | Admitting: Family Medicine

## 2011-01-08 DIAGNOSIS — M129 Arthropathy, unspecified: Secondary | ICD-10-CM | POA: Insufficient documentation

## 2011-01-08 DIAGNOSIS — G8918 Other acute postprocedural pain: Secondary | ICD-10-CM | POA: Insufficient documentation

## 2011-01-08 DIAGNOSIS — Z862 Personal history of diseases of the blood and blood-forming organs and certain disorders involving the immune mechanism: Secondary | ICD-10-CM | POA: Insufficient documentation

## 2011-01-08 DIAGNOSIS — IMO0002 Reserved for concepts with insufficient information to code with codable children: Secondary | ICD-10-CM | POA: Insufficient documentation

## 2011-01-08 DIAGNOSIS — E119 Type 2 diabetes mellitus without complications: Secondary | ICD-10-CM | POA: Insufficient documentation

## 2011-01-08 DIAGNOSIS — Z87898 Personal history of other specified conditions: Secondary | ICD-10-CM | POA: Insufficient documentation

## 2011-01-08 DIAGNOSIS — I10 Essential (primary) hypertension: Secondary | ICD-10-CM | POA: Insufficient documentation

## 2011-01-08 DIAGNOSIS — Z8639 Personal history of other endocrine, nutritional and metabolic disease: Secondary | ICD-10-CM | POA: Insufficient documentation

## 2011-01-08 DIAGNOSIS — Z9889 Other specified postprocedural states: Secondary | ICD-10-CM | POA: Insufficient documentation

## 2011-01-08 DIAGNOSIS — Y849 Medical procedure, unspecified as the cause of abnormal reaction of the patient, or of later complication, without mention of misadventure at the time of the procedure: Secondary | ICD-10-CM | POA: Insufficient documentation

## 2011-01-09 LAB — CBC
Hemoglobin: 13.5 g/dL (ref 13.0–17.0)
MCH: 31.9 pg (ref 26.0–34.0)
MCV: 92.7 fL (ref 78.0–100.0)
RBC: 4.23 MIL/uL (ref 4.22–5.81)

## 2011-01-09 LAB — BASIC METABOLIC PANEL
BUN: 29 mg/dL — ABNORMAL HIGH (ref 6–23)
CO2: 23 mEq/L (ref 19–32)
Chloride: 102 mEq/L (ref 96–112)
Creatinine, Ser: 1.49 mg/dL (ref 0.4–1.5)

## 2011-01-09 LAB — DIFFERENTIAL
Eosinophils Absolute: 0.2 10*3/uL (ref 0.0–0.7)
Lymphs Abs: 1.8 10*3/uL (ref 0.7–4.0)
Monocytes Absolute: 0.7 10*3/uL (ref 0.1–1.0)
Monocytes Relative: 9 % (ref 3–12)
Neutro Abs: 4.6 10*3/uL (ref 1.7–7.7)
Neutrophils Relative %: 63 % (ref 43–77)

## 2011-01-14 NOTE — Op Note (Signed)
[name], [name]              ACCOUNT NO.:  [id number]   MEDICAL RECORD NO.:  [id number]          PATIENT TYPE:  AMB   LOCATION:  ENDO                         FACILITY:  [facility]   PHYSICIAN:  Graylin Shiver, M.D.   DATE OF BIRTH:  04/07/1932   DATE OF PROCEDURE:  DATE OF DISCHARGE:  05/25/2008                               OPERATIVE REPORT   PROCEDURE:  Upper gastrointestinal endoscopy with foreign body removal.   INDICATIONS FOR PROCEDURE:  The patient is a 77 year old male who called  our office today with complaints of dysphagia.  He had been eating  chicken and a couple of hours ago while eating the chicken got stuck.  After that, he could not swallow anything.  He could not even swallow  his saliva.  He was told to come to the endoscopy unit at [facility].  The patient has had esophageal stenosis in the past with dilatation.   Informed consent was obtained after explanation of the risks of  bleeding, infection and perforation.   PREMEDICATIONS:  Fentanyl 50 mcg IV, Versed 7 mg IV.   DESCRIPTION OF PROCEDURE:  With the patient in the left lateral  decubitus position, the Pentax gastroscope was inserted into the  oropharynx and passed into the esophagus.  Upon entering the esophagus,  the esophagus was fluid filled with what appeared to be soupy type  material with pieces of food stuck in it.  I could see some strips of  chicken.  I suctioned the fluid as I passed the scope down the  esophagus.  At the level of the distal esophagus at the esophagogastric  junction, food was stuck and with gentle pressure and air insufflation,  it was able to be passed down into the stomach with clearance of the  esophagus.  The esophagus was then re-inspected.  I flushed it with  water and got the rest of the pieces of food out of it.  The stomach was  then entered.  There was a Billroth II.  The gastric mucosa looked  erythematous.  The intestinal limb looked okay.  The esophagus  revealed  a distal ring, but it did not actually look stenotic.  The rest of the  esophagus looked normal.  He tolerated the procedure well without  complications.   IMPRESSION:  Food impaction, which was cleared.   PLAN:  The patient will be discharged home from the endoscopy unit.           ______________________________  Graylin Shiver, M.D.     SFG/MEDQ  D:  05/25/2008  T:  05/27/2008  Job:  [id number]   cc:   Petra Kuba, M.D.  Fax: [phone]

## 2011-01-14 NOTE — Op Note (Signed)
[name], [name]              ACCOUNT NO.:  [id number]   MEDICAL RECORD NO.:  [id number]          PATIENT TYPE:  AMB   LOCATION:  ENDO                         FACILITY:  [facility]   PHYSICIAN:  Petra Kuba, M.D.    DATE OF BIRTH:  Sep 23, 1931   DATE OF PROCEDURE:  09/14/2007  DATE OF DISCHARGE:                               OPERATIVE REPORT   PROCEDURE:  EGD with Savary dilatation.   INDICATIONS FOR PROCEDURE:  Recurrent dysphagia.  Consent was signed  after risks, benefits, methods, and options were thoroughly discussed  multiple times in the past.   MEDICINES USED:  Fentanyl 75 mcg, Versed 7.5 mg.   DESCRIPTION OF PROCEDURE:  The video endoscope was inserted by direct  vision.  The esophagus was normal.  He did have a tiny to small hiatal  hernia and a widely patent fibrous ring at the GE junction.  The scope  passed into the stomach and some bilious material was suctioned.  He did  have a Billroth II anatomy with bile reflux gastritis.  Stomach was  evaluated on straightened and retroflexed visualization.  We did advance  a short ways down both of his jejunal limbs.  No abnormalities were  seen.  He did have more spasm than in the past.  Once we were fairly far  down one of the jejunal limbs under fluoroscopy guidance, the Savary  wire was advanced and the scope was removed making sure to keep the wire  in the proper location.  We did identify the GE junction on withdrawal  under fluoroscopy, returned to the distal end of the wire under  fluoroscopy.  Scope was removed and in succession the Savary 16 and then  18 mm dilators were advanced in the customary fashion.  Both were  confirmed in the proper position under fluoroscopy.  In the stomach,  there was no resistance on either dilator and no heme on either dilator.  Once the 18 was confirmed in the stomach, the wire was withdrawn back  into the dilator.  Both were removed in tandem.  The patient tolerated  the procedure  well.  There was no obvious immediate complication.   ENDOSCOPIC ASSESSMENT:  1. Small hiatal hernia with a widely patent fibrous thin ring.  2. Billroth II anastomosis with bile reflux gastritis.  3. Normal examination a short ways down both jejunal limbs.   THERAPY:  Savary dilatation to 18 mm without resistance or heme under  fluoroscopy.   PLAN:  See how the dilation works.  See how long he goes between  dilations.  Possibly consider balloon dilation in the future, although,  he seems to do well with the Savary.           ______________________________  Petra Kuba, M.D.     MEM/MEDQ  D:  09/14/2007  T:  09/14/2007  Job:  [id number]   cc:   Mina Marble, M.D.

## 2011-01-14 NOTE — Op Note (Signed)
NAMEJOSPEH, [name]              ACCOUNT NO.:  [id number]   MEDICAL RECORD NO.:  [id number]          PATIENT TYPE:  AMB   LOCATION:  ENDO                         FACILITY:  [facility]   PHYSICIAN:  Petra Kuba, M.D.    DATE OF BIRTH:  [birth year]   DATE OF PROCEDURE:  06/14/2008  DATE OF DISCHARGE:                               OPERATIVE REPORT   PROCEDURE:  Esophagogastroduodenoscopy with Savary dilatation.   INDICATIONS FOR PROCEDURE:  The patient with recurrent food impaction  with dysphagia, helped with dilation multiple times in the past.   MEDICINES USED:  75 mcg fentanyl, Versed 7.5 mg.   PROCEDURE:  The video endoscope was inserted by direct vision.  The  esophagus was somewhat tortuous.  In the distal esophagus was a tiny  small hiatal hernia and a widely patent fibrous ring.  We did have a  moderate amount of spasm at the GE junction.  Scope passed into the  stomach where the Billroth II anastomosis and bile reflux gastritis was  seen.  We did advance the scope down both limbs which were widely  patent.  There was some increased spasm in both duodenal limbs, we could  not advanced __________  either limb.  The scope was withdrawn back in  the stomach and evaluated on straight and retroflex visualization.  High  in the cardia, the hiatal hernia was confirmed.  No other abnormalities  __________  gastritis was seen.  The scope was initially withdrawn back  to 20 cm which confirmed above esophageal findings.  Scope was then  advanced a short ways back into the stomach and the duodenum.  The  Savary wire was advanced.  The scope was removed making sure to keep the  wire in the proper position under fluoro. We then proceeded with the  Savary 16 and 18 mm dilators.  Both were confirmed in the proper  position in the stomach under fluoro.  There was no heme on either  dilator and minimal resistance in passing both dilators.  Once the 18  was confirmed in the stomach under fluoro,  the wire was withdrawn back  in the dilator.  Both were removed in tandem.  The procedure was  terminated at that juncture.  The patient tolerated the procedure well.  There was no obvious immediate complication.   ENDOSCOPIC DIAGNOSES:  1. Tiny to small hiatal hernia with a widely patent thin fibrous ring.  2. Billroth II anastomosis and gastritis.  3. Otherwise normal esophagogastroduodenoscopy down both limbs      __________ increased duodenal spasm and gastroesophageal junction      spasm.   THERAPY:  Savary dilatation under fluoroscopy to 18 mm with minimal  resistance. __________   PLAN:  See how the dilation works.  Happy to see back p.r.n.  Otherwise  continue present management.           ______________________________  Petra Kuba, M.D.     MEM/MEDQ  D:  06/14/2008  T:  06/14/2008  Job:  [id number]   cc:   Mina Marble, M.D.  Fax: [phone]

## 2011-01-14 NOTE — Op Note (Signed)
[name], [name]              ACCOUNT NO.:  [id number]   MEDICAL RECORD NO.:  [id number]          PATIENT TYPE:  AMB   LOCATION:  ENDO                         FACILITY:  [facility]   PHYSICIAN:  Petra Kuba, M.D.    DATE OF BIRTH:  [birth year]   DATE OF PROCEDURE:  05/19/2007  DATE OF DISCHARGE:                               OPERATIVE REPORT   PROCEDURE:  Esophagogastroduodenoscopy with Savary dilatation.   INDICATION:  Recurrent dysphagia helped with dilation in the past.   CONSENT:  Signed after risks, benefits, methods, and options thoroughly  discussed multiple times in the past.   MEDICINES USED:  Fentanyl 75 mcg, Versed 5 mg.   DESCRIPTION OF PROCEDURE:  The video endoscope was inserted by direct  vision.  The proximal and mid esophagus were normal.  He did have a  small hiatal hernia and a very thin, widely patent ring in the distal  esophagus.  There was some tortuosity and spasm.  The scope passed into  the gastric remnant where some obvious bile reflux gastritis was seen.  The remnant was evaluated on straight-and-retroflex visualization.  High  in the cardia, a small hiatal hernia was confirmed.   We did advance the scope a short ways down both limbs which were normal.  We did not get to the end of either limb.  He did have a lot of spasm  and in one limb lots of bilious secretions.  The scope was then  withdrawn back to the stomach which was reevaluated.  The scope was  slowly withdrawn back to 20 cm which confirmed the previous esophagus.   FINDINGS:  The scope was re-advanced a very short ways down one of the  limbs; and fluoro-guidance the Savary wire was advanced and withdrawn  under fluoro, making sure to keep the wire in the proper location.  We  proceeded with Savary 15 and 17 mm dilators, both were advanced without  resistance, and confirmed in the proper position under fluoro.   There was no heme on the 15, and only trace on the 17.  The wire was  withdrawn back into the 17 dilator, both were removed in tandem.  The  procedure was terminated at that junction.  The patient tolerated the  procedure well.  There was no obvious immediate complication.   ENDOSCOPIC DIAGNOSES:  1. Small hiatal hernia with a thin widely patent ring, and some the GE      junction spasm.  2. Billroth II anastomosis with some bile reflux and gastritis, and      normal endoscopy down both limbs a short      way.  3. Status post Savary dilatation to 17 mm under fluoro.   PLAN:  See how the dilation works.  Follow up p.r.n. or in 2 months.  Slowly advance diet.           ______________________________  Petra Kuba, M.D.     MEM/MEDQ  D:  05/19/2007  T:  05/19/2007  Job:  [id number]   cc:   Mina Marble, M.D.  Fax: [phone]

## 2011-01-17 NOTE — Op Note (Signed)
[name], [name]              ACCOUNT NO.:  [id number]   MEDICAL RECORD NO.:  [id number]          PATIENT TYPE:  EMS   LOCATION:  MAJO                         FACILITY:  [facility]   PHYSICIAN:  Petra Kuba, M.D.    DATE OF BIRTH:  09/04/1931   DATE OF PROCEDURE:  10/11/2004  DATE OF DISCHARGE:  10/11/2004                                 OPERATIVE REPORT   PROCEDURE:  Esophagogastroduodenoscopy with food disimpaction.   PHYSICIAN:  Petra Kuba, M.D.   INDICATIONS:  Food impaction.   Consent was signed after risks, benefits, methods and options thoroughly  discussed prior to any premeds given.   MEDICINES USED:  Demerol 100, Versed 10.   DESCRIPTION OF PROCEDURE:  The video endoscope was inserted by direct  vision.  The proximal esophagus was dilated.  Beginning in the mid esophagus  there was lots of fluid with lots of stringy meats.  It required at least 10  Roth retrieval baskets pulling out a basketful of stringy meats in the  basket and removing the scope and then reintubating the esophagus before we  were able to see the distal esophagus and suction the majority of the fluid.  Two more foreign pieces were in the esophagus, but we could move past them  through a medium size fibrous ring into the stomach where some bilious  material was suctioned.  He did have a Billroth II anastomosis.  The stomach  was quickly evaluated on straight and retroflexed visualization.  The cardia  was normal except for the hiatal hernia.  No mass lesion was seen.  We did  not advance down both limbs but just looked at the anastomosis.  On slow  withdrawal through the esophagus, there was a small tear in the distal  esophagus.  A clot could not be washed off or made to bleed.  The rest of  the esophagus was free of food and debris.  It was dilated __________.  The  scope was removed after air was suctioned.  The patient tolerated the  procedure adequately.  There was no obvious significant  complication.   ENDOSCOPIC DIAGNOSES:  1.  Significant food impaction.  2.  Required 10-12 Roth retrieval basket removals full of stringy meats.  3.  Small hiatal hernia with a moderately tight fibrous ring.  4.  Billroth II gastritis and anastomosis.  5.  Distal esophagus with a distal small tear and clot, unable to make bleed      or wash off.   PLAN:  1.  Clear liquids before he leaves the ER.  If doing okay, can slowly      advance diet tomorrow.  2.  Will need to increase his Zantac or a trial of pump inhibitors.  3.  Chew his foods well.  4.  I am happy to see back and set him up for a dilatation or he can go back      to his previous gastroenterologist who has dilated him in the past,      although I do not know who that is, maybe Dr. Petra Kuba knows.  MEM/MEDQ  D:  10/11/2004  T:  10/13/2004  Job:  [id number]   cc:   Eino Farber., M.D.  [address] [place]  [place] [address]  Fax: [phone]   Quita Skye. Artis Flock, M.D.  [address], [address]  [place]  [place] [address]  Fax: [phone]

## 2011-01-17 NOTE — Op Note (Signed)
[name], [name]                        ACCOUNT NO.:  [id number]   MEDICAL RECORD NO.:  [id number]                   PATIENT TYPE:  OBV   LOCATION:  0373                                 FACILITY:  [facility]   PHYSICIAN:  Valetta Fuller, M.D.               DATE OF BIRTH:  1931-10-17   DATE OF PROCEDURE:  05/11/2004  DATE OF DISCHARGE:                                 OPERATIVE REPORT   PREOPERATIVE DIAGNOSES:  1.  Left distal ureteral stone, 11 mm.  2.  Left hydronephrosis.  3.  Acute renal failure.  4.  Febrile urinary tract infection.   POSTOPERATIVE DIAGNOSES:  1.  Left distal ureteral stone, 11 mm.  2.  Left hydronephrosis.  3.  Acute renal failure.  4.  Febrile urinary tract infection.   PROCEDURES PERFORMED:  1.  Cystoscopy.  2.  Left retrograde pyelography  3.  Left double-J stent placement.   SURGEON:  Valetta Fuller, M.D.   ANESTHESIA:  General.   INDICATIONS:  [name] is a 77 year old male with a history of  nephrolithiasis.  He presented to the ER with severe left-sided abdominal  pain.  There, he was noted to be febrile with a temperature of 101 degrees.  His urine was relatively unremarkable.  On stone protocol CT, the patient  had an 11 mm stone in his distal left ureter causing significant obstruction  and left hydronephrosis.  He was felt to have potential high-grade  obstruction of his left kidney with fever and apparent urinary source.  We  felt that intervention we required, especially given the fact that this  stone has virtually zero chance of spontaneous passage.  The patient  appeared to understand the advantages and disadvantages of this approach.  It was felt that given the febrile status and his acute renal failure, that  manipulation of the stone would not be indicated and that we ought to really  bypass the obstruction and deal with the stone definitively on another date.   TECHNIQUE AND FINDINGS:  The patient was brought to the  operating room where  he had successful induction of general anesthesia.  He was placed in  lithotomy position and prepped and draped in the usual manner.  Cystoscopy  revealed some mild trilobar hyperplasia and otherwise unremarkable bladder.  Retrograde pyelogram confirmed a very large filling defect in the distal  ureter.  This was consistent with an 11-12 mm stone.  There was  marked ureteral and collecting system dilation.  It took some time to get a  wire beyond the stone which seemed to be impacted.  Once the wire was placed  to the renal pelvis, we were able to place a 26 cm 7 French double-J stent  without difficulty.  The patient appeared to tolerate the procedure well,  and there were no obvious complications.  Valetta Fuller, M.D.    DSG/MEDQ  D:  05/11/2004  T:  05/12/2004  Job:  [id number]

## 2011-01-17 NOTE — Discharge Summary (Signed)
[name], [name]              ACCOUNT NO.:  [id number]   MEDICAL RECORD NO.:  [id number]          PATIENT TYPE:  INP   LOCATION:  0373                         FACILITY:  [facility]   PHYSICIAN:  Valetta Fuller, M.D.  DATE OF BIRTH:  03/20/1932   DATE OF ADMISSION:  05/11/2004  DATE OF DISCHARGE:  05/13/2004                                 DISCHARGE SUMMARY   DISCHARGE DIAGNOSES:  1.  Ureteral calculus.  2.  Acute renal failure.  3.  Hydronephrosis.  4.  Febrile urinary tract infection.  5.  Diabetes mellitus type 2.  6.  Hypertension.   PROCEDURE:  Cystoscopy, retrograde pyelography and double J stent placement  on May 11, 2004.   HOSPITAL COURSE:  [name] is a 77 year old African-American male. He has  had a history of nephrolithiasis in the past although it has been several  years since he has been seen and evaluated by Dr. Su Grand.  The patient  was known to have a 10-12 mm stone in his left kidney based on a CT scan  done earlier this year. The patient presented on May 10, 2004 to the  [facility] with fever, chills and left sided back discomfort.  In the emergency room, he was noted to be febrile to a little over 101  degrees. His white blood cell count was not elevated. The patient's  creatinine was noted to be 4.1 although we did not have a baseline  creatinine on him.  Based on protocol, a noncontrast CT was performed which  revealed a 10-12 mm distal left ureteral calculus causing significant  hydronephrosis.  The patient was felt to have an obstructed left kidney with  renal insufficiency and a febrile urinary tract infection.  We felt that the  patient did require hospitalization for supportive care and a procedure to  unobstruct the left kidney. We elected to transfer the patient to [facility] for surgery and hospitalization.  On May 11, 2004, the patient  underwent uneventful cystoscopy with left retrograde pyelogram. This  confirmed a large filling defect in the distal left ureter with significant  obstruction. A double J stent was placed.   On postoperative day one, the patient's situation had improved. His pain was  better.  His Tmax was still 101.3 degrees. His creatinine had decreased  slightly to 39 and overall urine output was good.  His white blood cell  count was 15,000 at that time.  We continued him on IV Cipro.  By  postoperative day two, he was feeling better but still febrile.  Creatinine  had decreased slightly to 3.8.  His initial blood cultures were negative x2.  The patient was felt to be a candidate for outpatient management. For that  reason, he was discharged at that time on Levaquin 1 every day. He was also  sent home with some pain medication. Our plan will be follow him up as an  outpatient in a few days to chick his clinical status as well as to reassess  his renal function. He will eventually require definitive procedure for the  ureteral calculus. Of note, after the patient was discharged, we were called  with a blood culture that was positive for enterococcus.  For that reason,  his antibiotics were changed to amoxicillin as an outpatient.    DSG/MEDQ  D:  06/13/2004  T:  06/13/2004  Job:  [id number]

## 2011-01-17 NOTE — Discharge Summary (Signed)
   NAME:  [name], [name]                        ACCOUNT NO.:  [id number]   MEDICAL RECORD NO.:  [id number]                   PATIENT TYPE:  INP   LOCATION:  0450                                 FACILITY:  [facility]   PHYSICIAN:  Leonie Man, M.D.                DATE OF BIRTH:  [birth year]   DATE OF ADMISSION:  05/21/2003  DATE OF DISCHARGE:  05/28/2003                                 DISCHARGE SUMMARY   ADMISSION DIAGNOSES:  1. Intermittently incarcerating incisional hernia.  2. Diabetes mellitus type 2.  3. Hypertension.  4. Gastroesophageal reflux disease.  5. Past history of gastric lymphoma.   HISTORY OF PRESENT ILLNESS:  [name] is a 77 year old man who was status  post repair of his ventral hernia twice in the past and most recently with a  Gore-Tex mesh in 2000.  Prior to that, he had had Marlex mesh repairs.  He  comes into the hospital now with recurrences in the left upper quadrant and  the right sided mid abdomen.  The mid abdominal wound is tender and has  incarcerated colon with it as seen on CT scan.  The upper hernia shows some  fat incarcerated and this is just mildly tender. The patient is admitted for  repair of his hernia due to intermittent incarcerations.   HOSPITAL COURSE:  Following admission to the hospital the patient was taken  to the operating room on February 19, 2003, after completion of a bowel prep and  on that day he underwent repair of his hernias.  His postoperative course  has been benign with resumption of his diet and activity on the fifth  postoperative day.  He was passing gas and having normal bowel movements and  tolerating his diet well.  On postoperative day #6, he was discharged to be  followed up in the office in two weeks.   DISCHARGE MEDICATIONS:  Vicodin one to two tablets every four to six hours  as needed. He is to resume his home medications as prescribed.   During the course of his hospitalization, his CBG stayed well within  normal  limits while on a sliding scale subcutaneous insulin protocol with his  highest CBG being 215.  Electrolyte panel and liver function studies were  all within normal limits.   FOLLOW UP:  [name] is to follow up with me in the office in  approximately two weeks.   ACTIVITY:  As tolerated.   DIET:  A 2000 calorie diabetic diet.                                               Leonie Man, M.D.    PB/MEDQ  D:  06/20/2003  T:  06/20/2003  Job:  [id number]

## 2011-01-17 NOTE — Op Note (Signed)
NAMEVALDEMAR, [name]              ACCOUNT NO.:  [id number]   MEDICAL RECORD NO.:  [id number]          PATIENT TYPE:  AMB   LOCATION:  ENDO                         FACILITY:  [facility]   PHYSICIAN:  Petra Kuba, M.D.    DATE OF BIRTH:  [birth year]   DATE OF PROCEDURE:  10/30/2004  DATE OF DISCHARGE:                                 OPERATIVE REPORT   PROCEDURE:  Esophagogastroduodenoscopy with Savary dilatation.   INDICATION:  Dysphagia, esophageal stricture, recent food impaction.   Consent was signed after risks, benefits, methods, options thoroughly  discussed in the office.   MEDICINES USED:  1.  Demerol 75.  2.  Versed 7.5.   PROCEDURE:  Video endoscope was inserted by direct vision.  The proximal  esophagus was dilated, mid esophagus was normal.  In the distal esophagus  was a small hiatal hernia and a small fibrous ring.  There was no signs of  inflammation or Barrett's esophagus.  The scope passed into the stomach and  some green bilious material was suctioned and advanced to the anastomosis,  which was a Billroth II anastomosis.  There was some bile reflux gastritis.  The scope was inserted a short ways into both limbs.  It cannot be advanced  to the end of either limb.  The scope was withdrawn back to the stomach and  retroflexed high in the cardia and the hiatal hernia was confirmed.  The  residual stomach was evaluated on straight and retroflexed visualization and  other than some edematous folds in the bile reflux gastritis, no additional  findings were seen.  The scope was slowly withdrawn back to about 20 cm.  The above-mentioned esophageal findings were confirmed.  We then went down  one of the limbs a short ways and advanced the Savary wire into that limb  and withdrew under fluoro guidance, making sure to keep the wire in the  proper location, and in succession the Savary 12, 14, 16 and 17 mm dilators  were all advanced over the wire, confirming the proper position  in the  stomach under fluoro.  There was no heme on any of the dilators.  Minimal  resistance mostly in the posterior pharynx with advancing the 16 and the 17.  Once the 17 was confirmed in the stomach, wire was withdrawn back into the  dilator, both were removed in tandem.  Procedure terminated at this  junction.  Patient tolerated the procedure well.  There was no obvious  immediate complication.   ENDOSCOPIC DIAGNOSES:  1.  Distal esophageal ring fairly widely patent.  2.  Small hiatal hernia.  3.  Proximal esophagus was dilated.  4.  Billroth II anastomosis with patency through both limbs but not advanced      to either end.  5.  Bile reflux gastritis therapy, Savary dilatation under fluoroscopy to 17      mm.   PLAN:  Slowly advance diet, will watch for signs of complications.  Redilation or increased dilation p.r.n.  For followup in 2 months, recheck  symptoms and make sure no further workup plans are needed.  MEM/MEDQ  D:  10/30/2004  T:  10/30/2004  Job:  [id number]   cc:   Eino Farber., M.D.  [address] [place]  [place] [address]  Fax: [phone]

## 2011-01-17 NOTE — H&P (Signed)
NAME:  [name], [name]                        ACCOUNT NO.:  [id number]   MEDICAL RECORD NO.:  [id number]                   PATIENT TYPE:  INP   LOCATION:  0450                                 FACILITY:  [facility]   PHYSICIAN:  Leonie Man, M.D.                DATE OF BIRTH:  1932/08/18   DATE OF ADMISSION:  05/21/2003  DATE OF DISCHARGE:                                HISTORY & PHYSICAL   REASON FOR ADMISSION:  Recurrent ventral hernia.   HISTORY:  A 77 year old man, status post repair of ventral hernia, twice in  the past, most recently with  Gore-Tex mesh in the year 2000.  Prior to  that, he had a Marlex mesh repair in 1998.  He now comes with recurrences in  the left upper quadrant and right mid abdomen.  The mid abdominal wound is  tender and contains colon on CT scan evaluation.  The upper hernia shows  only fat evidence of incarceration.  The patient is admitted for repair of  the hernia due to intermittent incarcerations of this hernia.  It should be  noted that he has gained significant weight and has a very large and  protuberant abdomen.   PAST MEDICAL HISTORY:   SURGERIES:  1. Subtotal gastrectomy for lymphoma.  2. Repeating jejunostomy.  3. Right inguinal hernia repair.  4. Ventral hernia repairs x 2.  5. Lumbar laminectomy.  6. Lithotripsy for renal stone disease.   MEDICINE AND MEDICATIONS:  1. Diabetes mellitus.  Glucotrol 5 mg daily.  2. Avandia 8 mg daily.  3. Gastroesophageal reflux disease.  Ranitidine 150 mg daily.  4. Hypertension.  Lopressor with hydrochlorothiazide 100/25 daily.  5. Norvasc 10 mg daily.  6. Lotrel 5/10 mg daily.  7. Furosemide 40 mg daily.  8. The patient also takes aspirin 325 mg daily.   SOCIAL HISTORY:  The patient is a 77 year old retired man, lives alone.  His  next of kin is his son.  He quit smoking cigarettes some years ago.  He does  not use alcohol.   REVIEW OF SYSTEMS:  Negative in detail except as noted above and  for the  addition of multi-joint osteoarthritis.   PHYSICAL EXAMINATION:  GENERAL:  This is an obese man in no acute distress.  VITAL SIGNS:  Stable.  He is afebrile.  HEAD AND NECK:  Poor dentition.  PERRLA.  EOMI.  No carotid bruits.  No  thyromegaly.  No cervical lymphadenopathy.  LUNGS:  Clear to auscultation.  HEART:  Regular rate and rhythm without murmurs.  ABDOMEN:  Protuberant, however, soft, distended.  He is tender at the right  flank hernia.  Both the right flank hernia and the left upper quadrant  hernia are easily reducible.  There is no palpable visceromegaly.  There are  no abdominal masses.  EXTREMITIES:  No calf tenderness and no edema.  Extremity pulses are within  normal limits.  NEUROLOGIC EXAMINATION:  The patient is fully oriented, moves all four  extremities, and there are no gross sensory deficits noted.   ASSESSMENT:  1. Recurrent ventral hernias.  2. Diabetes mellitus.  3. Hypertension.  4. Gastroesophageal reflux.  5. Status post partial gastrectomy for lymphoma.   PLAN:  1. Admission.  2. Bowel prep.  3. Repair of his ventral hernias.  4. Full discussion of the risks and potential benefits of surgery have been     conducted with the patient.  All questions have been answered, consents     obtained.                                               Leonie Man, M.D.    PB/MEDQ  D:  05/21/2003  T:  05/21/2003  Job:  [id number]   cc:   Dr. Lurene Shadow (2 copies)

## 2011-01-17 NOTE — Op Note (Signed)
NAME:  [name], [name]                        ACCOUNT NO.:  [id number]   MEDICAL RECORD NO.:  [id number]                   PATIENT TYPE:  INP   LOCATION:  0450                                 FACILITY:  [facility]   PHYSICIAN:  Leonie Man, M.D.                DATE OF BIRTH:  [birth year]   DATE OF PROCEDURE:  05/22/2003  DATE OF DISCHARGE:                                 OPERATIVE REPORT   PREOPERATIVE DIAGNOSIS:  Recurrent ventral hernias.   POSTOPERATIVE DIAGNOSIS:  Recurrent ventral hernias.   OPERATION/PROCEDURE:  Repair of recurrent ventral hernias with mesh.   SURGEON:  Leonie Man, M.D.   ASSISTANT:  Nurse.   ANESTHESIA:  General.   INDICATIONS:  The patient is a 77 year old man who is status post two prior  ventral hernia repairs, most recent one done in the year 2000 in which he  had a large Gore-Tex mesh placed over this entire abdominal wall.  Prior to  that he had an operation in 1998 with Marlex mesh which was removed at the  second operation.  He comes to the operating room now having had a  recurrence in the right mid abdomen and the left upper quadrant.  The risks  and potential benefits of surgery have been fully discussed.  All questions  answered and consent obtained.  The patient comes to the operating room  after full bowel prep.   DESCRIPTION OF PROCEDURE:  Following the induction of satisfactory general  anesthesia, the patient was placed supine and the abdomen prepped and draped  in the to be included in the sterile operating field.   A long midline incision is carried down from the xiphoid to and below the  umbilicus deepening this through the skin and subcutaneous tissue, down to  the mesh.  A skin flap was raised on the right side carrying the flap over  the mesh to the hernia in the right flank.  The hernia was incised and freed  from the surrounding tissues and finger inserted into the peritoneal cavity.  At that point, two small hernias could  be felt somewhat caudad to the  original large hernia.  The fascia was then incised upon the psoas to open  up the entire defect into a single defect.  Dissection was carried down to  the flank muscles to secure tissue.  I used a mesh patch over this defect  which was approximately 8 x 4 inches size.  This was sewn in with running  suture of #1 Novofil.  The repair was then fully inspected and noted to be  intact and sponge and instruments were verified for this hernia.   Attention was then turned to the left upper quadrant where I raised the knob  of the skin flap over the mesh, carrying it laterally to the hernial sac.  This was dissected free on all sides and freed up adhesions.  I used  a  large, 2 inch x 2 inch disk, inserted this into the peritoneal cavity and  sutured the tapes down to the wound edge as well as the mesh.  Sponge,  instrument and sharp counts were then verified from the second wound.  The  abdominal wound was  irrigated with saline.  The subcutaneous tissues were closed with a running  2-0 Vicryl suture.  The skin was closed with staples.  Sterile dressings  were applied, anesthetic reversed and the patient removed from the operating  room to the recovery room in stable condition.  He tolerated the procedure  well.                                                Leonie Man, M.D.    PB/MEDQ  D:  05/22/2003  T:  05/22/2003  Job:  [id number]

## 2011-01-17 NOTE — H&P (Signed)
NAME:  [name], [name]                        ACCOUNT NO.:  [id number]   MEDICAL RECORD NO.:  [id number]                   PATIENT TYPE:  OBV   LOCATION:  0101                                 FACILITY:  [facility]   PHYSICIAN:  Valetta Fuller, M.D.               DATE OF BIRTH:  April 25, 1932   DATE OF ADMISSION:  05/11/2004  DATE OF DISCHARGE:                                HISTORY & PHYSICAL   CHIEF COMPLAINT:  Left-sided abdominal flank pain with recently-diagnosed 11  mm distal left ureteral calculus and acute renal failure.   HISTORY OF PRESENT ILLNESS:  [name] is a 77 year old African-American  male.  He is only a fair historian, although he has received significant  amounts of intravenous pain medication.  He apparently has had  nephrolithiasis in the past and has been seen and evaluated by Lindaann Slough, M.D.  It is unclear when the last time he had any clinical stone  problems.  I did note that the patient had a CT scan in March of this year,  which showed a very large nonobstructing left renal calculus that appeared  to be in the order of 10-12 mm in size.  The patient presented to the [facility]  emergency room early this morning with significant fever, chills, and left-  sided back discomfort.  In the emergency room he was noted to be febrile to  101.1 degrees.  A urinalysis was actually relatively unremarkable.  White  blood cell count was not elevated.  The patient was, however, noted to have  a significantly elevated creatinine of 4.1.  The patient underwent stone  protocol, noncontrast CT scan, which appeared to reveal an 11 mm distal left  ureteral calculus causing significant obstruction of the left kidney.  There  were also significant bilateral renal cysts.  The patient also had some  evidence of previous gastric and small intestinal surgery.  The patient  appears to have  had a normal creatinine of approximately 1.6 about six  months ago.  It appears that he was in the  emergency room in June of this  year, at which point his creatinine was 2.6.  That did not appear to have  been further investigated at that time.  He tells me he has only had the  abdominal pain really for the last several days, but it is unclear how long  that stone may have been in the ureter.  He has had, again, some fever and  chills.  He tells me he has been urinating reasonably well and has had no  gross hematuria or severe dysuria.   PAST MEDICAL HISTORY:  1.  Type 2 diabetes mellitus.  2.  Hypertension.  3.  History of gout.  4.  Hyperlipidemia.  5.  Gastroesophageal reflux.   CURRENT MEDICATIONS:  Glipizide, Avandia, Zantac, allopurinol, iron, Toprol  XL, meclizine, Lotrel, Norvasc, and Lasix.  He also takes a  daily aspirin.   The patient denies any drug allergies.   The patient's family and social history are otherwise noncontributory.   REVIEW OF SYSTEMS:  Most notable for fever and chills and mild nausea and  negative for real significant voiding complaints.   PHYSICAL EXAMINATION:  GENERAL:  The patient is a well-developed, well-  nourished male.  He is in moderate distress.  His presenting temperature was  100.0 degrees.  His blood pressure was 139/75 with a pulse of 75.  He was  complaining of excruciating pain at that time.  He subsequently did have a  spike of his temperature to 101.1 degrees and now is 99.2 degrees this  morning.  NECK:  No obvious JVD.  CHEST:  Some scattered coarse breath sounds consistent with some mild  pulmonary congestion.  He does have some mild left CVA tenderness.  There is  also some mild left lower quadrant and left upper quadrant tenderness.  ABDOMEN:  Otherwise protuberant but soft without obvious palpable masses.  GENITOURINARY:  External genitalia reveals no significant abnormalities.  Digital rectal exam is deferred at this time.   LABORATORY DATA:  Notable for a white blood cell count of 9.1 and a  hemoglobin of 12.2.  His  potassium is 4.5.  The BUN and creatinine are  significantly elevated at 55 and 4.1, respectively.  Urinalysis is  relatively unremarkable.   ASSESSMENT:  An 11 mm left distal ureteral stone causing significant  obstruction with concurrent febrile illness and renal failure.  Mr. Willinger  clearly needs to have admission for supportive care and intervention to  unobstruct the left kidney.  I feel that given his current situation with  febrile illness as well as the renal insufficiency and some questionable  pulmonary congestion that it would certainly be in his best interests to  limit the amount of manipulation that we perform today.  Given the large  size of his stone, I think holmium laser lithotripsy in this particular  setting would be ill-advised at this time.  I believe ultimately he should  have a double J stent to decompress the kidney to treat the infection and  hopefully normalize or significantly improve his renal function.  He may  need some additional medical evaluation as well and careful monitoring of  his diabetes.  Once the situation is stabilized, then in the next five to 10  days we may very well be able to either perform lithotripsy via external  shock wave approach or potential ureteroscopy with holmium laser  lithotripsy.  The patient was given a dose of Cipro SAP in the ER.  He will  be transferred from [facility] to [facility] for surgery and  hospitalization.  We will check his clinical status and may ask the  hospitalist to evaluate him pending how he does with surgery.  Of note, the  patient's urine pH was 5.5 and he does have a history of gout, which does  raise the possibility that this may be uric acid nephrolithiasis and if it  is, we may consider urinary alkalinization as a treatment or adjunct to his  treatment.  He appeared to understand these issues.  He understands that he does need something done to at least unobstruct the kidney given his fever   and renal failure.  We will plan on this procedure sometime in the next few  hours.  Valetta Fuller, M.D.    DSG/MEDQ  D:  05/11/2004  T:  05/11/2004  Job:  [id number]   cc:   Lindaann Slough, M.D.  [address]  [place]  [place] [address]  Fax: [phone]   Eino Farber., M.D.  [address] [place]  [place] [address]  Fax: [phone]

## 2011-01-17 NOTE — Discharge Summary (Signed)
NAMEMICHAEL, WALRATH              ACCOUNT NO.:  [id number]   MEDICAL RECORD NO.:  [id number]          PATIENT TYPE:  INP   LOCATION:  5533                         FACILITY:  [facility]   PHYSICIAN:  Eino Farber., M.D.DATE OF BIRTH:  [birth year]   DATE OF ADMISSION:  01/20/2004  DATE OF DISCHARGE:  01/24/2004                                 DISCHARGE SUMMARY   DISCHARGE DIAGNOSES:  1.  Dehydration with severe azotemia.  2.  Acute gastroenteritis, treated and resolved.  3.  Diabetes mellitus, type 2, uncontrolled, treated and improved requiring      insulin therapy.  4.  Renal insufficiency, moderate.  5.  Hypertensive heart disease, moderate.  6.  Gastric lymphoma history, treated, controlled.  7.  Degenerative joint disease of lumbar spine.  8.  Anemia of chronic disease.  9.  Obesity, moderate.  10. Hyperuricemia.   HISTORY OF PRESENT ILLNESS/HOSPITAL COURSE:  The patient is a 77 year old  black male with a known history of multiple medical problems who presented  to the ER and was admitted with a complaint of recurrent vomiting and loose  stools.  The patient was found to have changes of dehydration as well as  gastritis-type symptoms.  His BUN was increased at 79 mg% with a serum  creatinine of 5.2 mg% but a normal serum potassium of 4.4 mg/L.  The patient  was given IV fluids for hydration and he did improve with his BUN decreasing  to 53 mg% on 01/24/04 with a serum creatinine of 3.8 mg%.  The patient also  revealed some anemia with his hemoglobin in the 10 g% to 10.1 g% range.  The  patient gradually improved with IV fluids and antacid medication as well as  other medications for his diabetes and hypertension.   DISCHARGE MEDICATIONS:  1.  Furosemide 40 mg one daily.  2.  Lotrel 5/10 capsule one daily.  3.  Norvasc  5 mg tabs one daily if additional medication needed for      systolic hypertension over 40 mmHg.  4.  Meclizine 25 mg one q.i.d. as needed for  dizziness.  5.  Toprol XL 50 mg tabs one daily.  6.  Avandia 8 mg tabs one on Monday, Wednesday, Friday and Sunday with      breakfast.  7.  Glipizide XL 10 mg tabs one daily a.c. breakfast.  8.  Zantac 150 mg tabs one every night.  9.  Flexeril 5 mg tabs one q.a.m. and at bedtime for muscle spasms.  10. Hydrocodone/APAP 5/500 mg tablets one q.4h. p.r.n. for pain.  11. Welchol 625 mg tabs two every evening with dinner for his cholesterol.  12. Allopurinol 100 mg tabs one daily for hyperuricemia.   PLAN:  The patient will ambulate as tolerated.  He will have a low salt, low  fat, no concentrated sweets diet.  The patient did receive a pneumococcal  vaccine 0.5 mL on Jan 21, 2004.  He is to have a followup visit in the  office in June in Mina Marble, M.D.'s office.       GRK/MEDQ  D:  07/12/2004  T:  07/13/2004  Job:  [id number]

## 2011-01-17 NOTE — Op Note (Signed)
NAME:  [name], [name]                        ACCOUNT NO.:  [id number]   MEDICAL RECORD NO.:  [id number]                   PATIENT TYPE:  AMB   LOCATION:  ENDO                                 FACILITY:  [facility]   PHYSICIAN:  Georgiana Spinner, M.D.                 DATE OF BIRTH:  April 11, 1932   DATE OF PROCEDURE:  DATE OF DISCHARGE:                                 OPERATIVE REPORT   PROCEDURE:  Colonoscopy.   INDICATIONS:  Hemoccult positivity.   ANESTHESIA:  Demerol 50, Versed 5 mg.   PROCEDURE:  With the patient mildly sedated in the left lateral decubitus  position, subsequently rolled to his back and to his right side, back to his  back and again to his left side, the Olympus video scopic colonoscope was  inserted in the rectum and passed under direct vision with pressure applied  and after various movements, we finally did reach the cecum as identified by  the ileocecal valve and base of the cecum which was photographed.  From this  point, after we had felt secure that we had seen the entire cecum, the  colonoscope was then slowly withdrawn, taking circumferential views of the  remaining colonic mucosa, stopping then only in the rectum which appeared  normal on direct, showed hemorrhoids on retroflexed view.  The endoscope was  straightened and withdrawn.  The patient's vital signs and pulse oximetry  remained stable.  The patient tolerated the procedure well without apparent  complication.   FINDINGS:  Internal hemorrhoids, otherwise unremarkable colonoscopic  examination.   PLAN:  See endoscopy note.                                               Georgiana Spinner, M.D.    GMO/MEDQ  D:  11/30/2003  T:  11/30/2003  Job:  [id number]

## 2011-01-17 NOTE — Op Note (Signed)
[name], [name]              ACCOUNT NO.:  [id number]   MEDICAL RECORD NO.:  [id number]          PATIENT TYPE:  EMS   LOCATION:  MAJO                         FACILITY:  [facility]   PHYSICIAN:  Petra Kuba, M.D.    DATE OF BIRTH:  September 19, 1931   DATE OF PROCEDURE:  06/14/2006  DATE OF DISCHARGE:  06/14/2006                                 OPERATIVE REPORT   PROCEDURE:  EGD with food disimpaction.   INDICATIONS:  Food impaction.  Consent was signed after risks, benefits,  methods, options thoroughly discussed prior to any sedation given.   MEDICINES USED:  Fentanyl 50, Versed 4.   PROCEDURE:  The video endoscope was inserted by direct vision.  He had a  slightly dilated esophagus.  In the distal esophagus was obviously fluid and  then food was seen.  We could not easily advanced around the food; we went  ahead and advanced a Mellody Dance and 4 times grabbed food;  withdrew the food and the scope together.  The food was recovered after the  fourth was removed.  The scope was reinserted and a widely patent ring was  seen.  There was no more food in the esophagus.  The scope passed into the  stomach; some fluid was suctioned.  We could see the Billroth II  anastomosis, but there was increased food in the stomach.  He did begin  belching at the point.  We went ahead and slowly withdrew.  On quick  retroflexion only food was seen.  With his retching and belching we elected  to stop the procedure.  Again, a good look at the esophagus showed that  there was no food remaining and no obvious mass or lesions were seen.  The  scope was removed.  The patient tolerated the procedure well.  There was no  obvious immediate complication.   ENDOSCOPIC DIAGNOSES:  1. Obvious food impaction, status post 4 Roth retrieval nets and removal      of the food and the scope together  2. Medium hiatal hernia with a widely patent fibrous ring.  3. Increased food and debris in the stomach, and a  Billroth II anastomosis      seen.   PLAN:  Hold aspirin and Advil for 3 days.  Clear liquids for 12 hours and  slowly advance diet.  Chew food well.  Call me p.r.n. for increased  swallowing problems or in 2-3 weeks.  To follow-up in the office and  probably set up repeat dilatation.           ______________________________  Petra Kuba, M.D.     MEM/MEDQ  D:  06/14/2006  T:  06/15/2006  Job:  [id number]   cc:   Dr. Petra Kuba

## 2011-01-17 NOTE — Consult Note (Signed)
[name], [name]              ACCOUNT NO.:  [id number]   MEDICAL RECORD NO.:  [id number]          PATIENT TYPE:  EMS   LOCATION:  MAJO                         FACILITY:  [facility]   PHYSICIAN:  Petra Kuba, M.D.    DATE OF BIRTH:  08-13-1932   DATE OF CONSULTATION:  06/14/2006  DATE OF DISCHARGE:  06/14/2006                                   CONSULTATION   HISTORY:  The patient known to me from a previous food impaction and  dilatation.  Supposedly had been doing fairly well since the dilatation, but  with eating meat tonight, had it get stuck and has been unable to get it up.  Presented to the emergency room.  Dr. Oletta Lamas called and asked me to come  proceed with endoscopy.   PAST MEDICAL HISTORY:  The patient's past medical history is pertinent for:  1. Hypertension.  2. Pacemaker.  3. Headaches.  4. Anemia.  5. Diabetes.  6. History of a hiatal hernia with food impactions in the past.  7. He has also had left shoulder surgery, back surgery and a Billroth II      gastrectomy.   ALLERGIES:  NONE.   FAMILY HISTORY:  Negative for a swallowing problems.   CURRENT MEDICATIONS:  Glucotrol, Lotrel, Zantac, aspirin, Tolerex, iron,  Advil, quinine, Flexeril, hydrocodone, Benicar.   REVIEW OF SYSTEMS:  Negative except for the above.   PHYSICAL EXAM:  GENERAL:  The patient with probable food impaction, spitting  into a basin.  LUNGS:  Clear.  HEART:  Regular rate and rhythm.  ABDOMEN:  Soft, nontender.   ASSESSMENT:  Probable food impaction.  The patient with multiple medical  problems and a history of food impactions and dilatations in the past.   PLAN:  Risks, benefits and methods of repeat endoscopy and attempts at food  removal were discussed, and we will proceed ASAP with further workup and  plan pending those findings.           ______________________________  Petra Kuba, M.D.    MEM/MEDQ  D:  06/14/2006  T:  06/14/2006  Job:  [id number]

## 2011-01-17 NOTE — Consult Note (Signed)
NAMETREW, SUNDE              ACCOUNT NO.:  [id number]   MEDICAL RECORD NO.:  [id number]          PATIENT TYPE:  EMS   LOCATION:                               FACILITY:  [facility]   PHYSICIAN:  Petra Kuba, M.D.    DATE OF BIRTH:  April 11, 1932   DATE OF CONSULTATION:  10/11/2004  DATE OF DISCHARGE:                                   CONSULTATION   HISTORY:  The patient is here at the request of Dr. Penni Bombard for food  impaction.  He has had these before but usually can cough it up.  Unfortunately, this time has been unsuccessfully.  Possibly he has been  dilated in the past.  He does have a history of stomach cancer followed by  [facility] and Dr. Horald Pollen.  Anytime he eats stringy meats, he has some sort  of problem particularly if he eats too fast.   PAST MEDICAL HISTORY:  1.  Heart failure.  2.  High blood pressure.  3.  Anemia.  4.  Gout.  5.  Diabetes.   CURRENT MEDICATIONS:  Lasix, glipizide, Avandia, Toprol, iron, allopurinol,  Norvasc, aspirin, and Lotrel as well as some Zantac.   He has no known allergies.   SOCIAL HISTORY:  Does not drink or smoke currently.   REVIEW OF SYSTEMS:  Negative except as above.   FAMILY HISTORY:  Noncontributory.   PHYSICAL EXAMINATION:  Vitals:  See chart.  Afebrile.  Patient spitting into  cup.  Throat is clear.  LUNGS:  Clear.  HEART:  Regular rate and rhythm.  ABDOMEN:  Soft, nontender.  Obvious hernia from his surgery.   ASSESSMENT:  Obvious food impaction.   PLAN:  Risks, benefits, methods of endoscopy and disimpaction endoscopically  were discussed. We will go ahead and proceed ASAP with further workup and  plans pending those findings.       ___________________________________________  Petra Kuba, M.D.    MEM/MEDQ  D:  10/11/2004  T:  10/13/2004  Job:  [id number]   cc:   Eino Farber., M.D.  [address] [place]  [place] [address]  Fax: [phone]   Mamie Nick

## 2011-01-17 NOTE — Op Note (Signed)
NAMEMAXXON, SCHWANKE              ACCOUNT NO.:  [id number]   MEDICAL RECORD NO.:  [id number]          PATIENT TYPE:  AMB   LOCATION:  NESC                         [facility]ACILITY:  [facility]   PHYSICIAN:  Valetta Fuller, M.D.  DATE OF BIRTH:  1931-09-22   DATE OF PROCEDURE:  05/28/2004  DATE OF DISCHARGE:                                 OPERATIVE REPORT   PREOPERATIVE DIAGNOSES:  1.  A 10-12 mm left distal ureteral calculus.  2.  History of acute renal insufficiency.  3.  History of urosepsis.   POSTOPERATIVE DIAGNOSES:  1.  A 10-12 mm left distal ureteral calculus.  2.  History of acute renal insufficiency.  3.  History of urosepsis.   PROCEDURES PERFORMED:  1.  Cystoscopy.  2.  Stent removal.  3.  Ureteroscopy.  4.  Holmium laser lithotripsy.  5.  Basketing of stone fragments.  6.  Retrograde pyelography.  7.  Replacement of double-J stent.   SURGEON:  Valetta Fuller, M.D.   ANESTHESIA:  General.   INDICATIONS:  Mr. Gacek is a 77 year old male.  He presented recently with  severe left-sided abdominal discomfort.  He was noted to be febrile.  A CT  scan showed an 11 mm stone in the distal left ureter causing significant  obstruction and left hydronephrosis.  The patient's creatinine was elevated  at approximately 4.0.  We had no previous renal function studies on the  patient.  We felt that he had acute renal failure and a febrile urinary  tract infection.  For that reason, the patient underwent cystoscopy with  retrograde pyelography and double-J stent placement.  This was done  approximately 2 weeks ago.  Subsequently, the patient's renal function has  improved, and his creatinine is now approximately 2.2, which we believe is  probably close to baseline.  He had defervesced and is no longer febrile.  He presents now for definitive management of his stone.   TECHNIQUE AND FINDINGS:  The patient was brought to the operating room where  he had successful induction of general  anesthesia.  He was placed in  lithotomy position and prepped and draped in the usual manner.  The patient  did have some moderate trilobar hyperplasia with some mild visual  obstruction.  The stent was found to be in good position and was partially  removed.  A guidewire was placed through the stent up to the left renal  pelvis.  The short ureteroscope was then easily engaged in the distal  ureter.  Several centimeters above the ureterovesical junction was a very  large and impacted 10-12 mm stone.  Holmium laser was utilized to break up  the stone.  The stone was incredibly hard, and we spent well over an hour  utilizing the laser to try to break the stone up.  The majority of the stone  was broken up into very fine particles.  Larger chunks were basket  extracted, and we removed at least 8-10 pieces in the 3-5 mm range.  There  was considerable mucosa edema where the stone was indeed impacted in the  ureter.  We felt the patient would benefit from an additional week or two of  double-J stent placement and for that reason, a 7 French 26 cm stent was  placed over the guidewire with fluoroscopic as well as visual guidance.  The  patient appeared to tolerate the procedure well, and there were no obvious  complications.      DSG/MEDQ  D:  05/28/2004  T:  05/28/2004  Job:  [id number]

## 2011-01-17 NOTE — Op Note (Signed)
[name], [name]              ACCOUNT NO.:  [id number]   MEDICAL RECORD NO.:  [id number]          PATIENT TYPE:  AMB   LOCATION:  ENDO                         FACILITY:  [facility]   PHYSICIAN:  Petra Kuba, M.D.    DATE OF BIRTH:  March 14, 1932   DATE OF PROCEDURE:  09/17/2006  DATE OF DISCHARGE:                               OPERATIVE REPORT   SURGEON:  Petra Kuba, M.D.   PROCEDURE:  Esophagogastroduodenoscopy with Savary dilatation.   INDICATIONS:  Dysphagia in a patient with a history of an esophageal  ring helped by dilation in the past.   CONSENT:  Consent was signed after risks, benefits, methods and options  were thoroughly discussed multiple times in the past.   MEDICINES USED:  Fentanyl 100 mcg, Versed 6 mg.   DESCRIPTION OF PROCEDURE:  The video endoscope was inserted by direct  vision.  His esophagus was normal.  In the distal esophagus was a small  hiatal hernia with a widely patent ring.  It seemed to be more patent  than on previous endoscopy.  The scope passed into the stomach, where  the Billroth II anastomosis was confirmed.  We were able to advance a  good way down both limbs, did not come to the end of either limb.  No  abnormalities were seen.  Once back in the stomach remnant, straight and  retroflex visualization did not reveal any additional findings, other  than the hiatal hernia being confirmed in the cardia.  Some bilious  material was suctioned.  The scope was then slowly withdrawn.  Again, a  good look at the esophagus was normal, except for the widely patent ring  and the hiatal hernia mentioned above.  The scope was then advanced to  the distal stomach.  A Savary wire was advanced.  The customary J loop  was confirmed endoscopically and under fluoro.  The scope was removed,  making sure to keep the wire in the proper location, and we proceeded  with the Savary 14, and then 16-mm dilators in the customary fashion.  When both were confirmed in the  stomach under fluoro, they were removed  in the customary fashion.  Once the 16 was confirmed, the wire was  withdrawn back into the dilator.  Both were removed in tandem.  There  was no heme on either dilator.  A minimal amount of resistance on the  16.  We elected to stop the procedure at that juncture.  The patient  tolerated the procedure well.  There was no obvious immediate  complication.   ENDOSCOPIC DIAGNOSES:  1. Small hiatal hernia with a widely patent ring.  2. Billroth II anastomosis, normal down both limbs.  3. Savary dilatation under fluoroscopy to 16 mm without any heme and      minimal resistance only.   PLAN:  See how the dilation works; continue present management; follow  up p.r.n. or in 2 months; more aggressive dilatation p.r.n.           ______________________________  Petra Kuba, M.D.     MEM/MEDQ  D:  09/17/2006  T:  09/17/2006  Job:  [id number]   cc:   Mina Marble, M.D.

## 2011-01-17 NOTE — Op Note (Signed)
NAME:  [name], [name]                        ACCOUNT NO.:  [id number]   MEDICAL RECORD NO.:  [id number]                   PATIENT TYPE:  AMB   LOCATION:  ENDO                                 FACILITY:  [facility]   PHYSICIAN:  Georgiana Spinner, M.D.                 DATE OF BIRTH:  09-04-1931   DATE OF PROCEDURE:  11/30/2003  DATE OF DISCHARGE:                                 OPERATIVE REPORT   PROCEDURE PERFORMED:  Upper endoscopy.   ENDOSCOPIST:  Georgiana Spinner, M.D.   INDICATIONS FOR PROCEDURE:  Hemoccult positivity, status post partial  gastrectomy.   ANESTHESIA:  Demerol 100 mg, Versed 7.5 mg.   DESCRIPTION OF PROCEDURE:  With the patient mildly sedated in the left  lateral decubitus position, the Olympus video endoscope was inserted in the  mouth and passed under direct vision through the esophagus which appeared  normal although there was a question of one flame of Barrett's esophagus  which was photographed and biopsied.  We entered into the stomach.  The  fundus and body appeared normal.  At the area of the junction of the stomach  and the small intestine where a large Billroth II anastomosis had been made,  the mucosa was heaped up somewhat and this was photographed and biopsied.  We traveled down both limbs of the Billroth II, afferent and efferent limb  and both appeared normal.  Photographs were taken.  From this point, the  endoscope was slowly withdrawn taking circumferential views of the small  intestinal mucosa until the endoscope was pulled back into the stomach.  It  had been placed on retroflexion to view the stomach from below.  The  endoscope was then straightened and withdrawn taking circumferential views  of the remaining gastric and esophageal mucosa.  The patient's vital signs  and pulse oximeter remained stable.  The patient tolerated the procedure  well without apparent complications.   FINDINGS:  Changes of question of Barrett's esophagus, biopsied along  with  changes of the anastomosis of the stomach to the small intestine.   PLAN:  Await biopsy report.  The patient will call me for results and follow  up with me as an outpatient, proceed to colonoscopy.                                               Georgiana Spinner, M.D.    GMO/MEDQ  D:  11/30/2003  T:  11/30/2003  Job:  [id number]   cc:   Eino Farber., M.D.  [address] [place]  [place] [address]  Fax: [phone]

## 2011-05-19 ENCOUNTER — Ambulatory Visit (HOSPITAL_COMMUNITY)
Admission: RE | Admit: 2011-05-19 | Discharge: 2011-05-19 | Disposition: A | Discharge status: Home / Self Care | Payer: Medicare Other | Source: Ambulatory Visit | Attending: Gastroenterology | Admitting: Gastroenterology

## 2011-05-19 ENCOUNTER — Emergency Department (HOSPITAL_COMMUNITY): Admission: EM | Admit: 2011-05-19 | Payer: Medicare Other | Source: Home / Self Care

## 2011-05-19 DIAGNOSIS — Z980 Intestinal bypass and anastomosis status: Secondary | ICD-10-CM | POA: Insufficient documentation

## 2011-05-19 DIAGNOSIS — IMO0002 Reserved for concepts with insufficient information to code with codable children: Secondary | ICD-10-CM | POA: Insufficient documentation

## 2011-05-19 DIAGNOSIS — I10 Essential (primary) hypertension: Secondary | ICD-10-CM | POA: Insufficient documentation

## 2011-05-19 DIAGNOSIS — T18108A Unspecified foreign body in esophagus causing other injury, initial encounter: Secondary | ICD-10-CM | POA: Insufficient documentation

## 2011-05-29 LAB — URINE CULTURE
Colony Count: NO GROWTH
Culture: NO GROWTH

## 2011-05-29 LAB — URINALYSIS, ROUTINE W REFLEX MICROSCOPIC
Bilirubin Urine: NEGATIVE
Ketones, ur: NEGATIVE
Ketones, ur: NEGATIVE
Leukocytes, UA: NEGATIVE
Nitrite: NEGATIVE
Nitrite: NEGATIVE
Protein, ur: 100 — AB
Specific Gravity, Urine: 1.021
Specific Gravity, Urine: 1.01
Urobilinogen, UA: 0.2
Urobilinogen, UA: 0.2
pH: 5

## 2011-05-29 LAB — URINE MICROSCOPIC-ADD ON

## 2011-05-29 LAB — POCT I-STAT, CHEM 8
Chloride: 106
HCT: 40
Hemoglobin: 13.6
Potassium: 4.1

## 2011-06-03 LAB — GLUCOSE, CAPILLARY

## 2011-06-03 LAB — URINE MICROSCOPIC-ADD ON

## 2011-06-03 LAB — URINALYSIS, ROUTINE W REFLEX MICROSCOPIC
Glucose, UA: NEGATIVE
Leukocytes, UA: NEGATIVE
Protein, ur: >300 — AB
pH: 6

## 2011-06-06 ENCOUNTER — Ambulatory Visit: Payer: Non-veteran care | Attending: Pulmonary Disease | Admitting: Physical Therapy

## 2011-06-06 DIAGNOSIS — M25619 Stiffness of unspecified shoulder, not elsewhere classified: Secondary | ICD-10-CM | POA: Insufficient documentation

## 2011-06-06 DIAGNOSIS — M6281 Muscle weakness (generalized): Secondary | ICD-10-CM | POA: Insufficient documentation

## 2011-06-06 DIAGNOSIS — IMO0001 Reserved for inherently not codable concepts without codable children: Secondary | ICD-10-CM | POA: Insufficient documentation

## 2011-06-06 DIAGNOSIS — M25519 Pain in unspecified shoulder: Secondary | ICD-10-CM | POA: Insufficient documentation

## 2011-06-06 LAB — POCT I-STAT, CHEM 8
BUN: 27 mg/dL — ABNORMAL HIGH (ref 6–23)
Calcium, Ion: 1.21 mmol/L (ref 1.12–1.32)
Chloride: 102 mEq/L (ref 96–112)
Creatinine, Ser: 1.8 mg/dL — ABNORMAL HIGH (ref 0.4–1.5)
Creatinine, Ser: 1.8 mg/dL — ABNORMAL HIGH (ref 0.4–1.5)
Glucose, Bld: 103 mg/dL — ABNORMAL HIGH (ref 70–99)
Glucose, Bld: 103 mg/dL — ABNORMAL HIGH (ref 70–99)
Hemoglobin: 15.3 g/dL (ref 13.0–17.0)
Hemoglobin: 15.3 g/dL (ref 13.0–17.0)
Potassium: 3.3 mEq/L — ABNORMAL LOW (ref 3.5–5.1)
Sodium: 140 mEq/L (ref 135–145)
TCO2: 28 mmol/L (ref 0–100)

## 2011-06-06 LAB — CBC
HCT: 42.6 % (ref 39.0–52.0)
Hemoglobin: 14.3 g/dL (ref 13.0–17.0)
MCV: 96.2 fL (ref 78.0–100.0)
Platelets: 209 10*3/uL (ref 150–400)
RBC: 4.43 MIL/uL (ref 4.22–5.81)
WBC: 3.9 10*3/uL — ABNORMAL LOW (ref 4.0–10.5)

## 2011-06-06 LAB — URINALYSIS, ROUTINE W REFLEX MICROSCOPIC
Bilirubin Urine: NEGATIVE
Glucose, UA: NEGATIVE mg/dL
Hgb urine dipstick: NEGATIVE
Specific Gravity, Urine: 1.009 (ref 1.005–1.030)
pH: 6 (ref 5.0–8.0)

## 2011-06-06 LAB — DIFFERENTIAL
Eosinophils Absolute: 0.1 10*3/uL (ref 0.0–0.7)
Eosinophils Relative: 4 % (ref 0–5)
Lymphocytes Relative: 29 % (ref 12–46)
Lymphs Abs: 1.1 10*3/uL (ref 0.7–4.0)
Monocytes Relative: 11 % (ref 3–12)

## 2011-06-06 LAB — URINE MICROSCOPIC-ADD ON: Urine-Other: NONE SEEN

## 2011-06-11 ENCOUNTER — Ambulatory Visit: Payer: Non-veteran care | Admitting: Physical Therapy

## 2011-06-12 LAB — STOOL CULTURE

## 2011-06-12 LAB — CLOSTRIDIUM DIFFICILE EIA

## 2011-06-13 ENCOUNTER — Ambulatory Visit: Payer: Non-veteran care | Admitting: Physical Therapy

## 2011-06-18 ENCOUNTER — Ambulatory Visit: Payer: Non-veteran care | Admitting: Physical Therapy

## 2011-06-19 LAB — CBC
HCT: 41.6
Hemoglobin: 14
MCHC: 33.6
Platelets: 216
RDW: 13.5

## 2011-06-19 LAB — URINALYSIS, ROUTINE W REFLEX MICROSCOPIC
Leukocytes, UA: NEGATIVE
Nitrite: NEGATIVE
Protein, ur: 100 — AB
Specific Gravity, Urine: 1.018
Urobilinogen, UA: 0.2

## 2011-06-19 LAB — URINE MICROSCOPIC-ADD ON

## 2011-06-19 LAB — DIFFERENTIAL
Basophils Relative: 0
Lymphocytes Relative: 6 — ABNORMAL LOW
Lymphs Abs: 0.5 — ABNORMAL LOW
Monocytes Relative: 9
Neutro Abs: 6.5
Neutrophils Relative %: 84 — ABNORMAL HIGH

## 2011-06-19 LAB — COMPREHENSIVE METABOLIC PANEL
BUN: 36 — ABNORMAL HIGH
Calcium: 9.3
Creatinine, Ser: 1.75 — ABNORMAL HIGH
Glucose, Bld: 186 — ABNORMAL HIGH
Total Protein: 7.2

## 2011-06-20 ENCOUNTER — Ambulatory Visit: Payer: Non-veteran care | Admitting: Physical Therapy

## 2011-06-24 ENCOUNTER — Ambulatory Visit: Payer: Non-veteran care | Admitting: Physical Therapy

## 2011-06-24 NOTE — Op Note (Signed)
  NAMEGRAEDEN, BITNER              ACCOUNT NO.:  [id number]  MEDICAL RECORD NO.:  [id number]  LOCATION:  ENDO                         FACILITY:  [facility]  PHYSICIAN:  Graylin Shiver, M.D.   DATE OF BIRTH:  1932-05-08  DATE OF PROCEDURE:  05/19/2011 DATE OF DISCHARGE:  05/19/2011                              OPERATIVE REPORT   INDICATION:  Meat impaction in the esophagus.  The patient is a 77 year old male who was eating ham about 3 hours ago. He could not swallow after this and was told to come to the hospital.  Informed consent was obtained after explanation of the risks of bleeding, infection, and perforation.  PREMEDICATION: 1. Fentanyl 50 mcg IV. 2. Versed 5 mg IV.  PROCEDURE:  With the patient in the left lateral decubitus position, the Pentax gastroscope was inserted into the oropharynx and passed into the esophagus.  It was advanced down the esophagus which was full of semi- solid food and a lot of liquid.  The scope was able to be negotiated down to the distal esophagus where a meat impaction was present with some gentle maneuvering of the scope tip and passage of a probe down the scope.  This was dislodged.  I was then able to enter into the stomach which was a Billroth II.  The patient had been retching so much prior to the procedure that the gastrointestinal anastomosis looked very irritated and friable.  Both limbs of the intestines looked fine.  The gastric pouch, otherwise looked fine.  The scope was then brought back up into the esophagus where I cleared it out from the smaller pieces of food that were present.  He tolerated the procedure well without complications.  IMPRESSION:  Meat impaction in distal esophagus.          ______________________________ Graylin Shiver, M.D.     SFG/MEDQ  D:  05/19/2011  T:  05/20/2011  Job:  [id number]  Electronically Signed by Herbert Moors MD on 06/24/2011 02:59:20 PM

## 2011-06-26 ENCOUNTER — Emergency Department (HOSPITAL_COMMUNITY)
Admission: EM | Admit: 2011-06-26 | Discharge: 2011-06-26 | Disposition: A | Discharge status: Home / Self Care | Payer: Non-veteran care | Attending: Emergency Medicine | Admitting: Emergency Medicine

## 2011-06-26 DIAGNOSIS — T18108A Unspecified foreign body in esophagus causing other injury, initial encounter: Secondary | ICD-10-CM | POA: Insufficient documentation

## 2011-06-26 DIAGNOSIS — IMO0002 Reserved for concepts with insufficient information to code with codable children: Secondary | ICD-10-CM | POA: Insufficient documentation

## 2011-06-26 DIAGNOSIS — Z85028 Personal history of other malignant neoplasm of stomach: Secondary | ICD-10-CM | POA: Insufficient documentation

## 2011-07-01 ENCOUNTER — Encounter: Payer: No Typology Code available for payment source | Admitting: Rehabilitation

## 2011-07-02 ENCOUNTER — Ambulatory Visit: Payer: Non-veteran care | Admitting: Physical Therapy

## 2011-07-03 ENCOUNTER — Encounter: Payer: No Typology Code available for payment source | Admitting: Rehabilitation

## 2011-07-04 ENCOUNTER — Ambulatory Visit: Payer: Non-veteran care | Attending: Pulmonary Disease | Admitting: Physical Therapy

## 2011-07-04 ENCOUNTER — Emergency Department (HOSPITAL_COMMUNITY)
Admission: EM | Admit: 2011-07-04 | Discharge: 2011-07-04 | Disposition: A | Discharge status: Home / Self Care | Payer: Non-veteran care | Attending: Emergency Medicine | Admitting: Emergency Medicine

## 2011-07-04 DIAGNOSIS — E119 Type 2 diabetes mellitus without complications: Secondary | ICD-10-CM | POA: Insufficient documentation

## 2011-07-04 DIAGNOSIS — IMO0001 Reserved for inherently not codable concepts without codable children: Secondary | ICD-10-CM | POA: Insufficient documentation

## 2011-07-04 DIAGNOSIS — M6281 Muscle weakness (generalized): Secondary | ICD-10-CM | POA: Insufficient documentation

## 2011-07-04 DIAGNOSIS — N498 Inflammatory disorders of other specified male genital organs: Secondary | ICD-10-CM | POA: Insufficient documentation

## 2011-07-04 DIAGNOSIS — Z7982 Long term (current) use of aspirin: Secondary | ICD-10-CM | POA: Insufficient documentation

## 2011-07-04 DIAGNOSIS — M25519 Pain in unspecified shoulder: Secondary | ICD-10-CM | POA: Insufficient documentation

## 2011-07-04 DIAGNOSIS — M25619 Stiffness of unspecified shoulder, not elsewhere classified: Secondary | ICD-10-CM | POA: Insufficient documentation

## 2011-07-04 DIAGNOSIS — N5089 Other specified disorders of the male genital organs: Secondary | ICD-10-CM | POA: Insufficient documentation

## 2011-07-04 DIAGNOSIS — Z85028 Personal history of other malignant neoplasm of stomach: Secondary | ICD-10-CM | POA: Insufficient documentation

## 2011-07-04 DIAGNOSIS — N509 Disorder of male genital organs, unspecified: Secondary | ICD-10-CM | POA: Insufficient documentation

## 2011-07-04 DIAGNOSIS — I10 Essential (primary) hypertension: Secondary | ICD-10-CM | POA: Insufficient documentation

## 2011-07-04 DIAGNOSIS — Z79899 Other long term (current) drug therapy: Secondary | ICD-10-CM | POA: Insufficient documentation

## 2011-07-08 ENCOUNTER — Encounter: Payer: No Typology Code available for payment source | Admitting: Physical Therapy

## 2011-07-11 ENCOUNTER — Ambulatory Visit: Payer: Non-veteran care | Admitting: Physical Therapy

## 2011-07-16 ENCOUNTER — Ambulatory Visit: Payer: Non-veteran care | Admitting: Physical Therapy

## 2011-07-18 ENCOUNTER — Ambulatory Visit: Payer: Non-veteran care | Admitting: Physical Therapy

## 2011-07-21 ENCOUNTER — Ambulatory Visit: Payer: Non-veteran care | Admitting: Physical Therapy

## 2011-07-23 ENCOUNTER — Ambulatory Visit: Payer: Non-veteran care | Admitting: Rehabilitation

## 2011-07-26 ENCOUNTER — Emergency Department (HOSPITAL_COMMUNITY)
Admission: EM | Admit: 2011-07-26 | Discharge: 2011-07-27 | Disposition: A | Discharge status: Home / Self Care | Payer: Non-veteran care | Attending: Emergency Medicine | Admitting: Emergency Medicine

## 2011-07-26 DIAGNOSIS — Z87898 Personal history of other specified conditions: Secondary | ICD-10-CM | POA: Insufficient documentation

## 2011-07-26 DIAGNOSIS — M25569 Pain in unspecified knee: Secondary | ICD-10-CM | POA: Insufficient documentation

## 2011-07-26 DIAGNOSIS — M25469 Effusion, unspecified knee: Secondary | ICD-10-CM | POA: Insufficient documentation

## 2011-07-26 DIAGNOSIS — Z79899 Other long term (current) drug therapy: Secondary | ICD-10-CM | POA: Insufficient documentation

## 2011-07-26 DIAGNOSIS — M25461 Effusion, right knee: Secondary | ICD-10-CM

## 2011-07-26 HISTORY — DX: Essential (primary) hypertension: I10

## 2011-07-26 HISTORY — DX: Pure hypercholesterolemia, unspecified: E78.00

## 2011-07-26 HISTORY — DX: Cardiac arrhythmia, unspecified: I49.9

## 2011-07-27 ENCOUNTER — Emergency Department (HOSPITAL_COMMUNITY): Payer: Non-veteran care

## 2011-07-27 ENCOUNTER — Encounter (HOSPITAL_COMMUNITY): Payer: Self-pay | Admitting: *Deleted

## 2011-07-27 LAB — BODY FLUID CELL COUNT WITH DIFFERENTIAL
Monocyte-Macrophage-Serous Fluid: 0 % — ABNORMAL LOW (ref 50–90)
Total Nucleated Cell Count, Fluid: 581 cu mm (ref 0–1000)

## 2011-07-27 MED ORDER — TRIAMCINOLONE ACETONIDE 40 MG/ML IJ SUSP
2.5000 mg | Freq: Once | INTRAMUSCULAR | Status: AC
  Administered 2011-07-27: 2.4 mg via INTRA_ARTICULAR
  Filled 2011-07-27: qty 0.06

## 2011-07-27 MED ORDER — HYDROCODONE-ACETAMINOPHEN 5-325 MG PO TABS
ORAL_TABLET | ORAL | Status: AC
  Filled 2011-07-27: qty 1

## 2011-07-27 MED ORDER — LIDOCAINE-EPINEPHRINE 2 %-1:100000 IJ SOLN
20.0000 mL | Freq: Once | INTRAMUSCULAR | Status: DC
  Filled 2011-07-27: qty 1

## 2011-07-27 MED ORDER — HYDROCODONE-ACETAMINOPHEN 5-325 MG PO TABS
1.0000 | ORAL_TABLET | Freq: Once | ORAL | Status: AC
  Administered 2011-07-27: 1 via ORAL

## 2011-07-27 NOTE — ED Notes (Signed)
Pt c/o exacerbation of chronic bilateral knee pain not controlled by usual pain medications.

## 2011-07-27 NOTE — ED Notes (Signed)
Patient is alert and oriented x3.  He has been given DC instructions with follow up care at the [facility] V/S stable.  He is not showing any signs of distress on DC He is being DC via walker with granddaughter

## 2011-07-27 NOTE — ED Notes (Signed)
Continued: has begun to hurt without relief from pain medication, ice or heat. Patient states that he is diabetic and he worries about the long term problems he could sustain from a possible infection in his knee.

## 2011-07-27 NOTE — ED Provider Notes (Signed)
History     CSN: [id number] Arrival date & time: 07/26/2011 11:48 PM   First MD Initiated Contact with Patient 07/26/11 2352      No chief complaint on file.   (Consider location/radiation/quality/duration/timing/severity/associated sxs/prior treatment) HPI Comments: [name] reports several falls in the last 4 months landing on his right knee.  Now he has medial right knee pain with bruising and some swelling.  He does have an appointment at the [facility] with his doctors December 12, but does not feel he can wait until then.  He is currently taking tramadol and an unknown narcotic on a daily basis for pain control to to his severe arthritis.  He does use a walker for ambulation and balance, he wears a shin brace on his right lower leg.  He does report increased pain with ambulation, with twisting of the, this is worse in the morning upon arising or after sitting for a prolonged period of time.  He has tried ice, heat, compression and wearing an immobilizer, which has not helped his discomfort.  The history is provided by the patient.    Past Medical History  Diagnosis Date  . Pneumonia   . Lymphoma     s/p partial gastrectomy    Past Surgical History  Procedure Date  . Partial gastrectomy 1994    for lymphoma    No family history on file.  History  Substance Use Topics  . Smoking status: Never Smoker   . Smokeless tobacco: Never Used  . Alcohol Use: No      Review of Systems  Constitutional: Negative.   HENT: Negative.   Eyes: Negative.   Respiratory: Negative.   Cardiovascular: Negative.   Gastrointestinal: Negative.   Genitourinary: Negative.   Musculoskeletal: Positive for back pain. Negative for myalgias and joint swelling.  Neurological: Negative.   Hematological: Negative.   Psychiatric/Behavioral: Negative.     Allergies  Review of patient's allergies indicates no known allergies.  Home Medications   Current Outpatient Rx  Name Route Sig Dispense  Refill  . ALLOPURINOL 100 MG PO TABS Oral Take 100 mg by mouth daily.      . ASPIRIN 81 MG PO TABS Oral Take 81 mg by mouth daily.      Marland Kitchen CARVEDILOL 12.5 MG PO TABS Oral Take 12.5 mg by mouth 2 (two) times daily with a meal.      . CYANOCOBALAMIN 100 MCG PO TABS Oral Take 100 mcg by mouth daily.      . CYCLOBENZAPRINE HCL 10 MG PO TABS Oral Take 10 mg by mouth 3 (three) times daily as needed.      . ERGOCALCIFEROL 50000 UNITS PO CAPS Oral Take 50,000 Units by mouth every 14 (fourteen) days.      . FERROUS SULFATE 325 (65 FE) MG PO TABS Oral Take 325 mg by mouth daily with breakfast.      . FUROSEMIDE 20 MG PO TABS Oral Take 20 mg by mouth daily.      Marland Kitchen GLIPIZIDE 10 MG PO TABS Oral Take 10 mg by mouth daily.      Marland Kitchen HYDROCODONE-ACETAMINOPHEN 5-325 MG PO TABS Oral Take 1-2 tablets by mouth every 6 (six) hours as needed.      Marland Kitchen LISINOPRIL 20 MG PO TABS Oral Take 20 mg by mouth daily.      Marland Kitchen MICONAZOLE NITRATE 2 % EX POWD Topical Apply topically as needed.      Marland Kitchen NIACIN 500 MG PO TABS Oral Take  500 mg by mouth 2 (two) times daily with a meal.      . PANTOPRAZOLE SODIUM 40 MG PO TBEC Oral Take 40 mg by mouth daily.      Marland Kitchen PIOGLITAZONE HCL 30 MG PO TABS Oral Take 30 mg by mouth daily.      Marland Kitchen POLYETHYLENE GLYCOL 3350 PO PACK Oral Take 17 g by mouth daily.      Marland Kitchen RANITIDINE HCL 150 MG PO CAPS Oral Take 150 mg by mouth 2 (two) times daily.      . SENNOSIDES 8.6 MG PO TABS Oral Take 2 tablets by mouth 2 (two) times daily.      . SERTRALINE HCL 100 MG PO TABS Oral Take 200 mg by mouth daily.      Marland Kitchen SIMVASTATIN 10 MG PO TABS Oral Take 10 mg by mouth at bedtime.        There were no vitals taken for this visit.  Physical Exam  Constitutional: He is oriented to person, place, and time. He appears well-developed and well-nourished.  HENT:  Head: Normocephalic.  Eyes: EOM are normal.  Pulmonary/Chest: Effort normal.  Musculoskeletal:       Right shoulder: He exhibits tenderness.       Right knee: He  exhibits ecchymosis. He exhibits no swelling.       Legs:      Small ecchymotic area medial aspect   Neurological: He is oriented to person, place, and time.  Skin: Skin is warm.  Psychiatric: He has a normal mood and affect.    ED Course  Apiration of blood/fluid Date/Time: 07/27/2011 2:49 AM Performed by: Sunnie Nielsen Authorized by: Sunnie Nielsen Consent: Verbal consent obtained. Risks and benefits: risks, benefits and alternatives were discussed Consent given by: patient Patient understanding: patient states understanding of the procedure being performed Patient consent: the patient's understanding of the procedure matches consent given Procedure consent: procedure consent matches procedure scheduled Patient identity confirmed: verbally with patient Time out: Immediately prior to procedure a "time out" was called to verify the correct patient, procedure, equipment, support staff and site/side marked as required. Preparation: Patient was prepped and draped in the usual sterile fashion. Local anesthesia used: yes Anesthesia: local infiltration Local anesthetic: lidocaine 1% with epinephrine Anesthetic total: 1 ml Patient sedated: no Patient tolerance: Patient tolerated the procedure well with no immediate complications. Comments: Right knee joint aspiration. Indication pain and effusion. Patient prepped and draped in a sterile manner. After lidocaine with epi anesthesia, a 20-gauge needle used to obtaining 40 cc of clear joint fluid. No purulent or blood noted. Joint fluid sent to lab for Gram stain and culture and cell count. Pain improved.  Injection of joint Date/Time: 07/27/2011 2:51 AM Performed by: Sunnie Nielsen Authorized by: Sunnie Nielsen Consent: Verbal consent obtained. Risks and benefits: risks, benefits and alternatives were discussed Consent given by: patient Patient understanding: patient states understanding of the procedure being performed Patient consent: the  patient's understanding of the procedure matches consent given Procedure consent: procedure consent matches procedure scheduled Patient identity confirmed: verbally with patient Time out: Immediately prior to procedure a "time out" was called to verify the correct patient, procedure, equipment, support staff and site/side marked as required. Preparation: Patient was prepped and draped in the usual sterile fashion. Local anesthesia used: yes Local anesthetic: lidocaine 1% without epinephrine Anesthetic total: 1 ml Patient tolerance: Patient tolerated the procedure well with no immediate complications. Comments: After consent, right knee joint injected with 2.5 mg of Kenalog  after aspiration as above. Patient tolerated well and pain improved.   Labs Reviewed - No data to display No results found.   Results for orders placed during the hospital encounter of 01/08/11  DIFFERENTIAL      Component Value Range   Neutrophils Relative 63  43 - 77 (%)   Neutro Abs 4.6  1.7 - 7.7 (K/uL)   Lymphocytes Relative 25  12 - 46 (%)   Lymphs Abs 1.8  0.7 - 4.0 (K/uL)   Monocytes Relative 9  3 - 12 (%)   Monocytes Absolute 0.7  0.1 - 1.0 (K/uL)   Eosinophils Relative 3  0 - 5 (%)   Eosinophils Absolute 0.2  0.0 - 0.7 (K/uL)   Basophils Relative 0  0 - 1 (%)   Basophils Absolute 0.0  0.0 - 0.1 (K/uL)  CBC      Component Value Range   WBC 7.2  4.0 - 10.5 (K/uL)   RBC 4.23  4.22 - 5.81 (MIL/uL)   Hemoglobin 13.5  13.0 - 17.0 (g/dL)   HCT 16.1  09.6 - 04.5 (%)   MCV 92.7  78.0 - 100.0 (fL)   MCH 31.9  26.0 - 34.0 (pg)   MCHC 34.4  30.0 - 36.0 (g/dL)   RDW 40.9  81.1 - 91.4 (%)   Platelets 189  150 - 400 (K/uL)  BASIC METABOLIC PANEL      Component Value Range   Sodium 135  135 - 145 (mEq/L)   Potassium 3.9  3.5 - 5.1 (mEq/L)   Chloride 102  96 - 112 (mEq/L)   CO2 23  19 - 32 (mEq/L)   Glucose, Bld 156 (*) 70 - 99 (mg/dL)   BUN 29 (*) 6 - 23 (mg/dL)   Creatinine, Ser 7.82  0.4 - 1.5 (mg/dL)    Calcium 9.7  8.4 - 10.5 (mg/dL)   GFR calc non Af Amer 46 (*) >60 (mL/min)   GFR calc Af Amer   (*) >60 (mL/min)   Value: 55            The eGFR has been calculated     using the MDRD equation.     This calculation has not been     validated in all clinical     situations.     eGFR's persistently     <60 mL/min signify     possible Chronic Kidney Disease.   Dg Knee Complete 4 Views Right  07/27/2011  *RADIOLOGY REPORT*  Clinical Data: Right knee pain and swelling.  RIGHT KNEE - COMPLETE 4+ VIEW  Comparison: Right knee radiographs performed 10/31/2010  Findings: There is no evidence of fracture or dislocation. Worsening degenerative change is noted at the medial compartment, with cortical irregularity and subcortical cyst formation along the medial femoral condyle, and mild associated sclerosis.  Marginal osteophytes are seen at the medial and patellofemoral compartments. Prominent enthesophytes are seen arising at the superior and inferior poles of the patella.  A fabella is noted.  A large knee joint effusion is noted, mildly increased in size from the prior study.  Mild scattered vascular calcifications are seen.   IMPRESSION:  1.  No evidence of fracture or dislocation. 2.  Large knee joint effusion, mildly increased in size from the prior study. 3.  Worsening degenerative change at the medial compartment; stable degenerative change at the patellofemoral compartment.  Original Report Authenticated By: Tonia Ghent, M.D.       MDM  Contusion Fracture  Septic  Joint Knee effusion   Arman Filter, NP 07/27/11 0011  Arman Filter, NP 07/27/11 0140    MD evaluated patient. PT is requesting steroid injection to his knee and joint aspiration. X-ray reviewed as above. for large effusion with no obvious fracture, I performed joint aspiration and steroid injection as above. Patient had good pain relief. There was no obvious blood or purulence and joint aspiration.  Prelim Cell count  reviewed. Culture pending. [facility] and agrees to d/c and f/u instructions  Medical screening examination/treatment/procedure(s) were conducted as a shared visit with non-physician practitioner(s) and myself.  I personally evaluated the patient during the encounter  Sunnie Nielsen, MD 07/27/11 [phone]

## 2011-07-30 ENCOUNTER — Ambulatory Visit: Payer: Non-veteran care | Admitting: Physical Therapy

## 2011-07-30 LAB — BODY FLUID CULTURE: Culture: NO GROWTH

## 2011-07-31 ENCOUNTER — Ambulatory Visit: Payer: Non-veteran care | Admitting: Rehabilitation

## 2011-08-05 ENCOUNTER — Ambulatory Visit: Payer: Non-veteran care | Attending: Pulmonary Disease | Admitting: Physical Therapy

## 2011-08-05 ENCOUNTER — Encounter: Payer: No Typology Code available for payment source | Admitting: Physical Therapy

## 2011-08-05 DIAGNOSIS — M6281 Muscle weakness (generalized): Secondary | ICD-10-CM | POA: Insufficient documentation

## 2011-08-05 DIAGNOSIS — M25519 Pain in unspecified shoulder: Secondary | ICD-10-CM | POA: Insufficient documentation

## 2011-08-05 DIAGNOSIS — IMO0001 Reserved for inherently not codable concepts without codable children: Secondary | ICD-10-CM | POA: Insufficient documentation

## 2011-08-05 DIAGNOSIS — M25619 Stiffness of unspecified shoulder, not elsewhere classified: Secondary | ICD-10-CM | POA: Insufficient documentation

## 2011-08-08 ENCOUNTER — Encounter: Payer: Non-veteran care | Admitting: Physical Therapy

## 2011-08-11 ENCOUNTER — Encounter (HOSPITAL_COMMUNITY): Payer: Self-pay | Admitting: Adult Health

## 2011-08-11 ENCOUNTER — Emergency Department (HOSPITAL_COMMUNITY)
Admission: EM | Admit: 2011-08-11 | Discharge: 2011-08-12 | Disposition: A | Discharge status: Home / Self Care | Payer: Medicare Other | Attending: Emergency Medicine | Admitting: Emergency Medicine

## 2011-08-11 DIAGNOSIS — E119 Type 2 diabetes mellitus without complications: Secondary | ICD-10-CM | POA: Insufficient documentation

## 2011-08-11 DIAGNOSIS — H579 Unspecified disorder of eye and adnexa: Secondary | ICD-10-CM | POA: Insufficient documentation

## 2011-08-11 DIAGNOSIS — I10 Essential (primary) hypertension: Secondary | ICD-10-CM | POA: Insufficient documentation

## 2011-08-11 DIAGNOSIS — R51 Headache: Secondary | ICD-10-CM

## 2011-08-11 NOTE — ED Notes (Signed)
C/o burning eyes and headache. Pt states he has no difficulty seeing and denies blurred vision. Alert and oriented. Answers all questions appropriately. Equal grip bilaterally, no drift or droop.

## 2011-08-12 ENCOUNTER — Ambulatory Visit: Payer: Non-veteran care | Admitting: Physical Therapy

## 2011-08-12 LAB — GLUCOSE, CAPILLARY: Glucose-Capillary: 193 mg/dL — ABNORMAL HIGH (ref 70–99)

## 2011-08-12 MED ORDER — IBUPROFEN 200 MG PO TABS: 600.0000 mg | ORAL_TABLET | Freq: Once | ORAL | Status: DC

## 2011-08-12 MED ORDER — HYDROCODONE-ACETAMINOPHEN 5-500 MG PO TABS: 1.0000 | ORAL_TABLET | Freq: Four times a day (QID) | ORAL | Status: DC | PRN

## 2011-08-12 NOTE — ED Provider Notes (Signed)
History     CSN: [id number] Arrival date & time: 08/11/2011  7:49 PM   First MD Initiated Contact with Patient 08/12/11 0151      Chief Complaint  Patient presents with  . Burning Eyes  . Headache    (Consider location/radiation/quality/duration/timing/severity/associated sxs/prior treatment) HPI The patient reports that his eyes have been burning for several days.  He has an ophthalmologist that has been following him for dry eyes.  He has a followup appointment with him in 2 days at the [place] in [place] however he reports that the burning became more severe this evening.  He has no changes in his vision.  He's had new no new rash to his face.  He denies nausea and vomiting.  He does report mild headache for several days.  His had no unilateral arm or leg weakness.  He has had no recent trauma to his head.  He is not on anticoagulants.   Past Medical History  Diagnosis Date  . Pneumonia   . Lymphoma     s/p partial gastrectomy  . Hypertension   . Hypercholesteremia   . Diabetes mellitus   . Gout   . Irregular heart beat     Past Surgical History  Procedure Date  . Partial gastrectomy 1994    for lymphoma  . Below knee leg amputation     R leg  . Hernia repair   . Joint replacement   . Back surgery     History reviewed. No pertinent family history.  History  Substance Use Topics  . Smoking status: Never Smoker   . Smokeless tobacco: Never Used  . Alcohol Use: No      Review of Systems  All other systems reviewed and are negative.    Allergies  Review of patient's allergies indicates no known allergies.  Home Medications   Current Outpatient Rx  Name Route Sig Dispense Refill  . ALLOPURINOL 100 MG PO TABS Oral Take 100 mg by mouth daily.      . ASPIRIN 81 MG PO TABS Oral Take 81 mg by mouth daily.      Marland Kitchen CARVEDILOL 12.5 MG PO TABS Oral Take 12.5 mg by mouth 2 (two) times daily with a meal.      . CYANOCOBALAMIN 100 MCG PO TABS Oral Take 100 mcg by mouth  daily.      . CYCLOBENZAPRINE HCL 10 MG PO TABS Oral Take 10 mg by mouth 3 (three) times daily as needed.      . ERGOCALCIFEROL 50000 UNITS PO CAPS Oral Take 50,000 Units by mouth every 14 (fourteen) days.      . FERROUS SULFATE 325 (65 FE) MG PO TABS Oral Take 325 mg by mouth daily with breakfast.      . FUROSEMIDE 20 MG PO TABS Oral Take 20 mg by mouth daily.      Marland Kitchen GLIPIZIDE 10 MG PO TABS Oral Take 10 mg by mouth daily.      Marland Kitchen HYDROCODONE-ACETAMINOPHEN 5-325 MG PO TABS Oral Take 1-2 tablets by mouth every 6 (six) hours as needed.      Marland Kitchen LISINOPRIL 20 MG PO TABS Oral Take 20 mg by mouth daily.      Marland Kitchen NIACIN 500 MG PO TABS Oral Take 500 mg by mouth 2 (two) times daily with a meal.      . PANTOPRAZOLE SODIUM 40 MG PO TBEC Oral Take 40 mg by mouth daily.      Marland Kitchen PIOGLITAZONE HCL 30  MG PO TABS Oral Take 30 mg by mouth daily.      Marland Kitchen POLYETHYLENE GLYCOL 3350 PO PACK Oral Take 17 g by mouth daily.      Marland Kitchen RANITIDINE HCL 150 MG PO CAPS Oral Take 150 mg by mouth 2 (two) times daily.      . SENNOSIDES 8.6 MG PO TABS Oral Take 2 tablets by mouth 2 (two) times daily.      . SERTRALINE HCL 100 MG PO TABS Oral Take 200 mg by mouth daily.      Marland Kitchen SIMVASTATIN 10 MG PO TABS Oral Take 10 mg by mouth at bedtime.      Marland Kitchen HYDROCODONE-ACETAMINOPHEN 5-500 MG PO TABS Oral Take 1 tablet by mouth every 6 (six) hours as needed for pain. 15 tablet 0  . MICONAZOLE NITRATE 2 % EX POWD Topical Apply topically as needed.        BP 149/82  Pulse 76  Temp(Src) 99 F (37.2 C) (Oral)  Resp 20  SpO2 100%  Physical Exam  Nursing note and vitals reviewed. Constitutional: He is oriented to person, place, and time. He appears well-developed and well-nourished.  HENT:  Head: Normocephalic and atraumatic.  Eyes: Conjunctivae, EOM and lids are normal. Pupils are equal, round, and reactive to light. Right eye exhibits no chemosis, no discharge, no exudate and no hordeolum. Left eye exhibits no chemosis, no discharge, no exudate  and no hordeolum.  Neck: Normal range of motion.  Cardiovascular: Normal rate, regular rhythm, normal heart sounds and intact distal pulses.   Pulmonary/Chest: Effort normal and breath sounds normal. No respiratory distress.  Abdominal: Soft. He exhibits no distension. There is no tenderness.  Musculoskeletal: Normal range of motion.  Neurological: He is alert and oriented to person, place, and time.  Skin: Skin is warm and dry.  Psychiatric: He has a normal mood and affect. Judgment normal.    ED Course  Procedures (including critical care time)  Labs Reviewed  GLUCOSE, CAPILLARY - Abnormal; Notable for the following:    Glucose-Capillary 193 (*)    All other components within normal limits  POCT CBG MONITORING   No results found.   1. Headache   2. Dry eyes    MDM   Is unclear what the burning in his eyes are.  I suspect this is worsening dry.  I will defer his ongoing treatment to his ophthalmologist.  His vision is normal.  He has a normal neurologic exam.  No indication for head CT.  Doubt stroke or intracranial abnormality.  Given a prescription for Vicodin helped his headache as the patient is driving home.       Lyanne Co, MD 08/12/11 [phone]

## 2011-08-14 ENCOUNTER — Ambulatory Visit: Payer: Non-veteran care | Admitting: Rehabilitation

## 2011-08-15 ENCOUNTER — Encounter: Payer: Non-veteran care | Admitting: Physical Therapy

## 2011-08-16 ENCOUNTER — Emergency Department (HOSPITAL_COMMUNITY)
Admission: EM | Admit: 2011-08-16 | Discharge: 2011-08-17 | Disposition: A | Discharge status: Home / Self Care | Payer: Medicare Other | Attending: Emergency Medicine | Admitting: Emergency Medicine

## 2011-08-16 DIAGNOSIS — E119 Type 2 diabetes mellitus without complications: Secondary | ICD-10-CM | POA: Insufficient documentation

## 2011-08-16 DIAGNOSIS — Z79899 Other long term (current) drug therapy: Secondary | ICD-10-CM | POA: Insufficient documentation

## 2011-08-16 DIAGNOSIS — S8001XA Contusion of right knee, initial encounter: Secondary | ICD-10-CM

## 2011-08-16 DIAGNOSIS — W010XXA Fall on same level from slipping, tripping and stumbling without subsequent striking against object, initial encounter: Secondary | ICD-10-CM | POA: Insufficient documentation

## 2011-08-16 DIAGNOSIS — E7800 Pure hypercholesterolemia, unspecified: Secondary | ICD-10-CM | POA: Insufficient documentation

## 2011-08-16 DIAGNOSIS — I10 Essential (primary) hypertension: Secondary | ICD-10-CM | POA: Insufficient documentation

## 2011-08-16 DIAGNOSIS — S8000XA Contusion of unspecified knee, initial encounter: Secondary | ICD-10-CM | POA: Insufficient documentation

## 2011-08-16 DIAGNOSIS — S8002XA Contusion of left knee, initial encounter: Secondary | ICD-10-CM

## 2011-08-16 DIAGNOSIS — Z87898 Personal history of other specified conditions: Secondary | ICD-10-CM | POA: Insufficient documentation

## 2011-08-16 DIAGNOSIS — S60519A Abrasion of unspecified hand, initial encounter: Secondary | ICD-10-CM

## 2011-08-16 DIAGNOSIS — IMO0002 Reserved for concepts with insufficient information to code with codable children: Secondary | ICD-10-CM | POA: Insufficient documentation

## 2011-08-17 ENCOUNTER — Emergency Department (HOSPITAL_COMMUNITY): Payer: Non-veteran care

## 2011-08-17 ENCOUNTER — Emergency Department (HOSPITAL_COMMUNITY): Payer: Medicare Other

## 2011-08-17 ENCOUNTER — Encounter (HOSPITAL_COMMUNITY): Payer: Self-pay | Admitting: Adult Health

## 2011-08-17 ENCOUNTER — Emergency Department (HOSPITAL_COMMUNITY)
Admission: EM | Admit: 2011-08-17 | Discharge: 2011-08-17 | Disposition: A | Discharge status: Home / Self Care | Payer: Non-veteran care | Attending: Emergency Medicine | Admitting: Emergency Medicine

## 2011-08-17 ENCOUNTER — Encounter (HOSPITAL_COMMUNITY): Payer: Self-pay | Admitting: *Deleted

## 2011-08-17 DIAGNOSIS — Z87898 Personal history of other specified conditions: Secondary | ICD-10-CM | POA: Insufficient documentation

## 2011-08-17 DIAGNOSIS — I10 Essential (primary) hypertension: Secondary | ICD-10-CM | POA: Insufficient documentation

## 2011-08-17 DIAGNOSIS — K439 Ventral hernia without obstruction or gangrene: Secondary | ICD-10-CM

## 2011-08-17 DIAGNOSIS — R109 Unspecified abdominal pain: Secondary | ICD-10-CM | POA: Insufficient documentation

## 2011-08-17 DIAGNOSIS — E119 Type 2 diabetes mellitus without complications: Secondary | ICD-10-CM | POA: Insufficient documentation

## 2011-08-17 LAB — GLUCOSE, CAPILLARY: Glucose-Capillary: 124 mg/dL — ABNORMAL HIGH (ref 70–99)

## 2011-08-17 LAB — URINALYSIS, ROUTINE W REFLEX MICROSCOPIC
Bilirubin Urine: NEGATIVE
Nitrite: NEGATIVE
Protein, ur: 100 mg/dL — AB
Urobilinogen, UA: 0.2 mg/dL (ref 0.0–1.0)

## 2011-08-17 MED ORDER — OXYCODONE-ACETAMINOPHEN 5-325 MG PO TABS
1.0000 | ORAL_TABLET | Freq: Once | ORAL | Status: AC
  Administered 2011-08-17: 1 via ORAL

## 2011-08-17 MED ORDER — OXYCODONE-ACETAMINOPHEN 5-325 MG PO TABS
2.0000 | ORAL_TABLET | Freq: Once | ORAL | Status: AC
  Administered 2011-08-17: 2 via ORAL
  Filled 2011-08-17: qty 2

## 2011-08-17 MED ORDER — OXYCODONE-ACETAMINOPHEN 5-325 MG PO TABS
ORAL_TABLET | ORAL | Status: AC
  Filled 2011-08-17: qty 1

## 2011-08-17 MED ORDER — HYDROCODONE-ACETAMINOPHEN 5-500 MG PO TABS: 1.0000 | ORAL_TABLET | Freq: Four times a day (QID) | ORAL | Status: DC | PRN

## 2011-08-17 NOTE — ED Notes (Signed)
Pt states he was walking too fast and he fell on cement. Pt presents w/ abrasions to bilateral knees, L 5th digit and pain to entire body.

## 2011-08-17 NOTE — ED Provider Notes (Signed)
History     CSN: [id number] Arrival date & time: 08/17/2011  1:57 PM   First MD Initiated Contact with Patient 08/17/11 1514      Chief Complaint  Patient presents with  . Abdominal Pain    (Consider location/radiation/quality/duration/timing/severity/associated sxs/prior treatment) Patient is a 77 y.o. male presenting with abdominal pain. The history is provided by the patient.  Abdominal Pain The primary symptoms of the illness include abdominal pain.    Pt presents to the Emergency  Department with complaints of abdominal pain. Pt was in the ED last night for a fall and diagnosed with knee abrasions and finger injury. Pt has a known Ventral Hernia that has been scanned multiple times. The pain is the same as his normal pain. The patient says that he was unable to get his Vicodin from the pharmacy. The reason for that is unclear. The pts last bowel movement was yesterday. NO vomiting, no diarrhea, no dizziness, no bowel incontinence.  Past Medical History  Diagnosis Date  . Pneumonia   . Lymphoma     s/p partial gastrectomy  . Hypertension   . Hypercholesteremia   . Diabetes mellitus   . Gout   . Irregular heart beat     Past Surgical History  Procedure Date  . Partial gastrectomy 1994    for lymphoma  . Below knee leg amputation     R leg  . Hernia repair   . Joint replacement   . Back surgery     History reviewed. No pertinent family history.  History  Substance Use Topics  . Smoking status: Never Smoker   . Smokeless tobacco: Never Used  . Alcohol Use: No      Review of Systems  Gastrointestinal: Positive for abdominal pain.  All other systems reviewed and are negative.    Allergies  Review of patient's allergies indicates no known allergies.  Home Medications   Current Outpatient Rx  Name Route Sig Dispense Refill  . ALLOPURINOL 100 MG PO TABS Oral Take 100 mg by mouth daily.      . ASPIRIN 81 MG PO TABS Oral Take 81 mg by mouth daily.        Marland Kitchen CARVEDILOL 12.5 MG PO TABS Oral Take 12.5 mg by mouth 2 (two) times daily with a meal.      . CYANOCOBALAMIN 100 MCG PO TABS Oral Take 100 mcg by mouth daily.      . CYCLOBENZAPRINE HCL 10 MG PO TABS Oral Take 10 mg by mouth 3 (three) times daily as needed. Muscle spasm    . ERGOCALCIFEROL 50000 UNITS PO CAPS Oral Take 50,000 Units by mouth every 14 (fourteen) days.      . FERROUS SULFATE 325 (65 FE) MG PO TABS Oral Take 325 mg by mouth daily with breakfast.      . FUROSEMIDE 20 MG PO TABS Oral Take 20 mg by mouth daily.      Marland Kitchen GLIPIZIDE 10 MG PO TABS Oral Take 10 mg by mouth daily.      Marland Kitchen HYDROCODONE-ACETAMINOPHEN 5-325 MG PO TABS Oral Take 1-2 tablets by mouth every 6 (six) hours as needed. pain    . LISINOPRIL 20 MG PO TABS Oral Take 20 mg by mouth daily.      . METHYLCELLULOSE 1 % OP SOLN Both Eyes Place 1 drop into both eyes daily.      Marland Kitchen MICONAZOLE NITRATE 2 % EX POWD Topical Apply topically as needed.      Marland Kitchen  NIACIN 500 MG PO TABS Oral Take 500 mg by mouth 2 (two) times daily with a meal.     . PANTOPRAZOLE SODIUM 40 MG PO TBEC Oral Take 40 mg by mouth daily.      Marland Kitchen PIOGLITAZONE HCL 30 MG PO TABS Oral Take 30 mg by mouth daily.      Marland Kitchen POLYETHYLENE GLYCOL 3350 PO PACK Oral Take 17 g by mouth daily.      Marland Kitchen RANITIDINE HCL 150 MG PO CAPS Oral Take 150 mg by mouth 2 (two) times daily.      . SENNOSIDES 8.6 MG PO TABS Oral Take 2 tablets by mouth 2 (two) times daily.      . SERTRALINE HCL 100 MG PO TABS Oral Take 200 mg by mouth daily.      Marland Kitchen SIMVASTATIN 10 MG PO TABS Oral Take 10 mg by mouth at bedtime.        BP 154/83  Pulse 72  Temp(Src) 98.2 F (36.8 C) (Oral)  Resp 20  SpO2 98%  Physical Exam  Constitutional: He is oriented to person, place, and time. He appears well-developed and well-nourished.  HENT:  Head: Normocephalic and atraumatic.  Eyes: Pupils are equal, round, and reactive to light.  Neck: Normal range of motion.  Cardiovascular: Normal rate and regular rhythm.    Pulmonary/Chest: Effort normal and breath sounds normal.  Abdominal: Soft. He exhibits no distension and no mass. There is tenderness (tenderness to Ventral Hernia, however, the hernia is reducible.). There is no rebound and no guarding.  Musculoskeletal: Normal range of motion.  Neurological: He is alert and oriented to person, place, and time.  Skin: Skin is warm and dry.    ED Course  Procedures (including critical care time)   Labs Reviewed  URINALYSIS, ROUTINE W REFLEX MICROSCOPIC   Dg Knee Complete 4 Views Left  08/17/2011  *RADIOLOGY REPORT*  Clinical Data: Fall  LEFT KNEE - COMPLETE 4+ VIEW  Comparison: 10/02/2007  Findings: Mild degenerative change in the medial and lateral compartment.  Moderate patellar spurring.  Joint effusion is present.  Negative for fracture.  IMPRESSION: No acute abnormality.  Original Report Authenticated By: Camelia Phenes, M.D.   Dg Knee Complete 4 Views Right  08/17/2011  *RADIOLOGY REPORT*  Clinical Data: Fall.  Knee pain  RIGHT KNEE - COMPLETE 4+ VIEW  Comparison: 07/27/2011  Findings: Moderate degenerative change.  Joint space narrowing and mild spurring of the medial compartment.  Extensive spurring of the patella.  There is a joint effusion.  Negative for fracture.  IMPRESSION: Moderate degenerative change.  Negative for fracture.  Original Report Authenticated By: Camelia Phenes, M.D.     No diagnosis found.  Ventral Hernia w/o signs of obstruction.  MDM  Pt here yesterday for fall. Dr. Juleen China examined patient at well. Will start work-up and manage pain. KUB xray shows no obstructive gas pattern or free air. U/A is normal.       Dorthula Matas, PA 08/17/11 [phone]

## 2011-08-17 NOTE — ED Notes (Signed)
C/o of abdominal pain from recent fall yesterday.

## 2011-08-17 NOTE — ED Provider Notes (Signed)
History     CSN: [id number] Arrival date & time: 08/16/2011 11:28 PM   First MD Initiated Contact with Patient 08/17/11 0043      Chief Complaint  Patient presents with  . Fall  . Abrasion    (Consider location/radiation/quality/duration/timing/severity/associated sxs/prior treatment) HPI Comments: Pt has hx of fall X 3 hours ago when walking too fast, tripped and fell on his knees and L hand.  Sx were acute in onset, constant, worse with ambulation and associated with hematoma to the L knee.  Denies head injury - states hurts all over since fall - "made my arthritis flare"  Patient is a 77 y.o. male presenting with fall. The history is provided by the patient and medical records.  Fall    Past Medical History  Diagnosis Date  . Pneumonia   . Lymphoma     s/p partial gastrectomy  . Hypertension   . Hypercholesteremia   . Diabetes mellitus   . Gout   . Irregular heart beat     Past Surgical History  Procedure Date  . Partial gastrectomy 1994    for lymphoma  . Below knee leg amputation     R leg  . Hernia repair   . Joint replacement   . Back surgery     History reviewed. No pertinent family history.  History  Substance Use Topics  . Smoking status: Never Smoker   . Smokeless tobacco: Never Used  . Alcohol Use: No      Review of Systems  Respiratory: Negative for cough and shortness of breath.   Musculoskeletal: Positive for joint swelling and gait problem.  Skin: Positive for wound.    Allergies  Review of patient's allergies indicates no known allergies.  Home Medications   Current Outpatient Rx  Name Route Sig Dispense Refill  . ALLOPURINOL 100 MG PO TABS Oral Take 100 mg by mouth daily.      . ASPIRIN 81 MG PO TABS Oral Take 81 mg by mouth daily.      Marland Kitchen CARVEDILOL 12.5 MG PO TABS Oral Take 12.5 mg by mouth 2 (two) times daily with a meal.      . CYANOCOBALAMIN 100 MCG PO TABS Oral Take 100 mcg by mouth daily.      . CYCLOBENZAPRINE HCL 10  MG PO TABS Oral Take 10 mg by mouth 3 (three) times daily as needed.      . ERGOCALCIFEROL 50000 UNITS PO CAPS Oral Take 50,000 Units by mouth every 14 (fourteen) days.      . FERROUS SULFATE 325 (65 FE) MG PO TABS Oral Take 325 mg by mouth daily with breakfast.      . FUROSEMIDE 20 MG PO TABS Oral Take 20 mg by mouth daily.      Marland Kitchen GLIPIZIDE 10 MG PO TABS Oral Take 10 mg by mouth daily.      Marland Kitchen HYDROCODONE-ACETAMINOPHEN 5-325 MG PO TABS Oral Take 1-2 tablets by mouth every 6 (six) hours as needed.      Marland Kitchen HYDROCODONE-ACETAMINOPHEN 5-500 MG PO TABS Oral Take 1 tablet by mouth every 6 (six) hours as needed for pain. 15 tablet 0  . LISINOPRIL 20 MG PO TABS Oral Take 20 mg by mouth daily.      Marland Kitchen MICONAZOLE NITRATE 2 % EX POWD Topical Apply topically as needed.      Marland Kitchen NIACIN 500 MG PO TABS Oral Take 500 mg by mouth 2 (two) times daily with a meal.     .  PANTOPRAZOLE SODIUM 40 MG PO TBEC Oral Take 40 mg by mouth daily.      Marland Kitchen PIOGLITAZONE HCL 30 MG PO TABS Oral Take 30 mg by mouth daily.      Marland Kitchen POLYETHYLENE GLYCOL 3350 PO PACK Oral Take 17 g by mouth daily.      Marland Kitchen RANITIDINE HCL 150 MG PO CAPS Oral Take 150 mg by mouth 2 (two) times daily.      . SENNOSIDES 8.6 MG PO TABS Oral Take 2 tablets by mouth 2 (two) times daily.      . SERTRALINE HCL 100 MG PO TABS Oral Take 200 mg by mouth daily.      Marland Kitchen SIMVASTATIN 10 MG PO TABS Oral Take 10 mg by mouth at bedtime.      Marland Kitchen HYDROCODONE-ACETAMINOPHEN 5-500 MG PO TABS Oral Take 1-2 tablets by mouth every 6 (six) hours as needed for pain. 15 tablet 0    BP 151/88  Pulse 70  Temp(Src) 98.5 F (36.9 C) (Oral)  Resp 18  Ht 6\' 2"  (1.88 m)  Wt 235 lb (106.595 kg)  BMI 30.17 kg/m2  SpO2 100%  Physical Exam  Nursing note and vitals reviewed. Constitutional: He appears well-developed and well-nourished. No distress.  HENT:  Head: Normocephalic and atraumatic.  Mouth/Throat: Oropharynx is clear and moist. No oropharyngeal exudate.  Eyes: Conjunctivae and EOM  are normal. Pupils are equal, round, and reactive to light. Right eye exhibits no discharge. Left eye exhibits no discharge. No scleral icterus.  Neck: Normal range of motion. Neck supple. No JVD present. No thyromegaly present.  Cardiovascular: Normal rate, regular rhythm, normal heart sounds and intact distal pulses.  Exam reveals no gallop and no friction rub.   No murmur heard. Pulmonary/Chest: Effort normal and breath sounds normal. No respiratory distress. He has no wheezes. He has no rales. He exhibits no tenderness.  Abdominal: Soft. Bowel sounds are normal. He exhibits no distension and no mass. There is no tenderness.       Large ventral wall hernia - soft and non tender  Musculoskeletal: Normal range of motion. He exhibits tenderness ( ttp over the bil knees - hematoma over teh L patella.  FROM of same knees.  Dec ROM of the bil shoudlers - hx of bil rotator cuff injury). He exhibits no edema.  Lymphadenopathy:    He has no cervical adenopathy.  Neurological: He is alert. Coordination normal.  Skin: Skin is warm and dry.       Abrasion to the L PIP joints fingers.  No lac  Psychiatric: He has a normal mood and affect. His behavior is normal.    ED Course  Procedures (including critical care time)  Labs Reviewed  GLUCOSE, CAPILLARY - Abnormal; Notable for the following:    Glucose-Capillary 124 (*)    All other components within normal limits  POCT CBG MONITORING   Dg Knee Complete 4 Views Left  08/17/2011  *RADIOLOGY REPORT*  Clinical Data: Fall  LEFT KNEE - COMPLETE 4+ VIEW  Comparison: 10/02/2007  Findings: Mild degenerative change in the medial and lateral compartment.  Moderate patellar spurring.  Joint effusion is present.  Negative for fracture.  IMPRESSION: No acute abnormality.  Original Report Authenticated By: Camelia Phenes, M.D.   Dg Knee Complete 4 Views Right  08/17/2011  *RADIOLOGY REPORT*  Clinical Data: Fall.  Knee pain  RIGHT KNEE - COMPLETE 4+ VIEW   Comparison: 07/27/2011  Findings: Moderate degenerative change.  Joint space narrowing and  mild spurring of the medial compartment.  Extensive spurring of the patella.  There is a joint effusion.  Negative for fracture.  IMPRESSION: Moderate degenerative change.  Negative for fracture.  Original Report Authenticated By: Camelia Phenes, M.D.     1. Contusion of knee, left   2. Contusion of right knee   3. Abrasion of hand       MDM  bil knee films, pain meds, reeval - otherwise no signs of head trauma and no ttp over the back / neck.      X-rays negative, vital signs normal, pain medication given  Vida Roller, MD 08/17/11 [phone]

## 2011-08-18 ENCOUNTER — Ambulatory Visit: Payer: Non-veteran care | Admitting: Rehabilitation

## 2011-08-21 ENCOUNTER — Emergency Department (HOSPITAL_COMMUNITY)
Admission: EM | Admit: 2011-08-21 | Discharge: 2011-08-22 | Disposition: A | Discharge status: Home / Self Care | Payer: Non-veteran care | Attending: Emergency Medicine | Admitting: Emergency Medicine

## 2011-08-21 ENCOUNTER — Encounter (HOSPITAL_COMMUNITY): Payer: Self-pay | Admitting: Emergency Medicine

## 2011-08-21 DIAGNOSIS — R079 Chest pain, unspecified: Secondary | ICD-10-CM | POA: Insufficient documentation

## 2011-08-21 DIAGNOSIS — L089 Local infection of the skin and subcutaneous tissue, unspecified: Secondary | ICD-10-CM | POA: Insufficient documentation

## 2011-08-21 DIAGNOSIS — W19XXXA Unspecified fall, initial encounter: Secondary | ICD-10-CM | POA: Insufficient documentation

## 2011-08-21 DIAGNOSIS — M25569 Pain in unspecified knee: Secondary | ICD-10-CM | POA: Insufficient documentation

## 2011-08-21 NOTE — ED Provider Notes (Signed)
Medical screening examination/treatment/procedure(s) were conducted as a shared visit with non-physician practitioner(s) and myself.  I personally evaluated the patient during the encounter.  79yM with abdominal pain. Pt with hx of chronic pain and no appreciable change from previous. Mild diffuse abodminal tenderness without peritoneal signs. Well healed surgical scars. Pt requesting pain medications. Doubt acute abdominal surgical process. Plan prn pain meds and outp fu.  Raeford Razor, MD 08/21/11 [phone]

## 2011-08-21 NOTE — ED Notes (Signed)
Pt presented to the Er with multiple complaints, states that he fell on Saturday, was seen, at that time xray is done, no Fx noted, however states that "rib" pain still present, also pt c/o left knee pain, states possible fluid collection, per pt very painful and swollen, noted abrasion from the fall, wound in the process of healing, however noted small drainage with clear fluid, no malodor noted at this time, dressing taken off and wound left to air. Bleeding controled.

## 2011-08-22 ENCOUNTER — Emergency Department (HOSPITAL_COMMUNITY): Payer: Non-veteran care

## 2011-08-22 ENCOUNTER — Ambulatory Visit: Payer: Non-veteran care | Admitting: Rehabilitation

## 2011-08-22 MED ORDER — CEPHALEXIN 500 MG PO CAPS: 500.0000 mg | ORAL_CAPSULE | Freq: Four times a day (QID) | ORAL | Status: DC

## 2011-08-22 MED ORDER — BACITRACIN ZINC 500 UNIT/GM EX OINT
TOPICAL_OINTMENT | CUTANEOUS | Status: AC
  Administered 2011-08-22: 05:00:00
  Filled 2011-08-22: qty 3.6

## 2011-08-22 MED ORDER — BACITRACIN ZINC 500 UNIT/GM EX OINT
TOPICAL_OINTMENT | CUTANEOUS | Status: AC
  Administered 2011-08-22: 05:00:00
  Filled 2011-08-22: qty 1.8

## 2011-08-22 MED ORDER — CEPHALEXIN 500 MG PO CAPS
ORAL_CAPSULE | ORAL | Status: AC
  Filled 2011-08-22: qty 1

## 2011-08-22 NOTE — ED Provider Notes (Signed)
PT SEEN WITH MIDLEVEL REPORTS RECENT FALL HE HAS FULL ROM OF LEFT KNEE BP 158/84  Pulse 73  Temp(Src) 97.8 F (36.6 C) (Oral)  Resp 19  SpO2 100%   Joya Gaskins, MD 08/22/11 [phone]

## 2011-08-22 NOTE — ED Notes (Signed)
Pt ambulating to BR with walker without difficutly

## 2011-08-24 ENCOUNTER — Encounter (HOSPITAL_COMMUNITY): Payer: Self-pay

## 2011-08-24 ENCOUNTER — Emergency Department (HOSPITAL_COMMUNITY)
Admission: EM | Admit: 2011-08-24 | Discharge: 2011-08-24 | Disposition: A | Discharge status: Home / Self Care | Payer: Medicare Other | Attending: Emergency Medicine | Admitting: Emergency Medicine

## 2011-08-24 DIAGNOSIS — Z5189 Encounter for other specified aftercare: Secondary | ICD-10-CM

## 2011-08-24 NOTE — ED Provider Notes (Signed)
History     CSN: [id number]  Arrival date & time 08/24/11  1724   First MD Initiated Contact with Patient 08/24/11 2024      Chief Complaint  Patient presents with  . Wound Check    (Consider location/radiation/quality/duration/timing/severity/associated sxs/prior treatment) HPI Comments: Was here 3 days ago after a fall causing abrasions to both knees and the 4th and 5th fingers of the left hand.  He is here for a wound check.  No problems or concerns.  Patient is a 77 y.o. male presenting with wound check. The history is provided by the patient.  Wound Check  He was treated in the ED 3 to 5 days ago.    Past Medical History  Diagnosis Date  . Pneumonia   . Lymphoma     s/p partial gastrectomy  . Hypertension   . Hypercholesteremia   . Diabetes mellitus   . Gout   . Irregular heart beat     Past Surgical History  Procedure Date  . Partial gastrectomy 1994    for lymphoma  . Hernia repair   . Joint replacement   . Back surgery     History reviewed. No pertinent family history.  History  Substance Use Topics  . Smoking status: Never Smoker   . Smokeless tobacco: Never Used  . Alcohol Use: No      Review of Systems  Constitutional: Negative for fever and chills.  Musculoskeletal: Negative for back pain and joint swelling.  Skin: Positive for wound. Negative for rash.    Allergies  Review of patient's allergies indicates no known allergies.  Home Medications   Current Outpatient Rx  Name Route Sig Dispense Refill  . ALLOPURINOL 100 MG PO TABS Oral Take 100 mg by mouth daily.      . ASPIRIN 81 MG PO TABS Oral Take 81 mg by mouth daily.      Marland Kitchen CARVEDILOL 12.5 MG PO TABS Oral Take 12.5 mg by mouth 2 (two) times daily with a meal.      . CEPHALEXIN 500 MG PO CAPS Oral Take 1 capsule (500 mg total) by mouth 4 (four) times daily. 28 capsule 0  . CYANOCOBALAMIN 100 MCG PO TABS Oral Take 100 mcg by mouth daily.      . CYCLOBENZAPRINE HCL 10 MG PO TABS  Oral Take 10 mg by mouth 3 (three) times daily as needed. Muscle spasm    . ERGOCALCIFEROL 50000 UNITS PO CAPS Oral Take 50,000 Units by mouth every 14 (fourteen) days.      . FERROUS SULFATE 325 (65 FE) MG PO TABS Oral Take 325 mg by mouth daily with breakfast.      . FUROSEMIDE 20 MG PO TABS Oral Take 20 mg by mouth daily.      Marland Kitchen GLIPIZIDE 10 MG PO TABS Oral Take 10 mg by mouth daily.      Marland Kitchen HYDROCODONE-ACETAMINOPHEN 5-325 MG PO TABS Oral Take 1-2 tablets by mouth every 6 (six) hours as needed. pain    . LISINOPRIL 20 MG PO TABS Oral Take 20 mg by mouth daily.      . METHYLCELLULOSE 1 % OP SOLN Both Eyes Place 1 drop into both eyes daily.      Marland Kitchen MICONAZOLE NITRATE 2 % EX POWD Topical Apply topically as needed.      Marland Kitchen NIACIN 500 MG PO TABS Oral Take 500 mg by mouth 2 (two) times daily with a meal.     . PANTOPRAZOLE SODIUM  40 MG PO TBEC Oral Take 40 mg by mouth daily.      Marland Kitchen PIOGLITAZONE HCL 30 MG PO TABS Oral Take 30 mg by mouth daily.      Marland Kitchen POLYETHYLENE GLYCOL 3350 PO PACK Oral Take 17 g by mouth daily.      Marland Kitchen RANITIDINE HCL 150 MG PO CAPS Oral Take 150 mg by mouth 2 (two) times daily.      . SENNOSIDES 8.6 MG PO TABS Oral Take 2 tablets by mouth 2 (two) times daily.      . SERTRALINE HCL 100 MG PO TABS Oral Take 200 mg by mouth daily.      Marland Kitchen SIMVASTATIN 10 MG PO TABS Oral Take 10 mg by mouth at bedtime.        BP 145/72  Pulse 67  Temp(Src) 98.1 F (36.7 C) (Oral)  Resp 18  SpO2 95%  Physical Exam  Nursing note and vitals reviewed. Constitutional: He is oriented to person, place, and time. He appears well-developed and well-nourished.  HENT:  Head: Normocephalic and atraumatic.  Neck: Normal range of motion. Neck supple.  Neurological: He is alert and oriented to person, place, and time.  Skin:       The abrasions all appear to be healing well. There is granulation tissue and looks fine.  No redness, purulence, or streaks.    ED Course  Procedures (including critical care  time)  Labs Reviewed - No data to display No results found.   No diagnosis found.    MDM          Geoffery Lyons, MD 08/24/11 2031

## 2011-08-24 NOTE — ED Notes (Signed)
Patient stable upon discharge.  

## 2011-08-24 NOTE — ED Notes (Signed)
Pt here to have wounds rechecked on his legs and fingers

## 2011-08-27 ENCOUNTER — Encounter (HOSPITAL_COMMUNITY): Payer: Self-pay | Admitting: Emergency Medicine

## 2011-08-27 ENCOUNTER — Emergency Department (HOSPITAL_COMMUNITY)
Admission: EM | Admit: 2011-08-27 | Discharge: 2011-08-27 | Disposition: A | Discharge status: Home / Self Care | Payer: Medicare Other | Attending: Emergency Medicine | Admitting: Emergency Medicine

## 2011-08-27 DIAGNOSIS — M25569 Pain in unspecified knee: Secondary | ICD-10-CM

## 2011-08-27 DIAGNOSIS — E7800 Pure hypercholesterolemia, unspecified: Secondary | ICD-10-CM | POA: Insufficient documentation

## 2011-08-27 DIAGNOSIS — Z79899 Other long term (current) drug therapy: Secondary | ICD-10-CM | POA: Insufficient documentation

## 2011-08-27 DIAGNOSIS — E119 Type 2 diabetes mellitus without complications: Secondary | ICD-10-CM | POA: Insufficient documentation

## 2011-08-27 DIAGNOSIS — I10 Essential (primary) hypertension: Secondary | ICD-10-CM | POA: Insufficient documentation

## 2011-08-27 DIAGNOSIS — M7989 Other specified soft tissue disorders: Secondary | ICD-10-CM | POA: Insufficient documentation

## 2011-08-27 NOTE — ED Provider Notes (Signed)
Medical screening examination/treatment/procedure(s) were conducted as a shared visit with non-physician practitioner(s) and myself.  I personally evaluated the patient during the encounter  Left knee, swollen, with over-lying healing abrasion. Decreased AROM d/t pain; grossly stable, no sig. Effusion.  Flint Melter, MD 08/27/11 [phone]

## 2011-08-27 NOTE — ED Provider Notes (Signed)
History     CSN: [id number]  Arrival date & time 08/27/11  1355   First MD Initiated Contact with Patient 08/27/11 [phone]      Chief Complaint  Patient presents with  . Knee Pain    (Consider location/radiation/quality/duration/timing/severity/associated sxs/prior treatment) HPI Comments: Patient had a recent fall on 08/16/11.  He was seen in the ED and xrays were done on his left knee which were negative for a fracture, but did show DJD.  He has been seen for left knee pain on 12/15, 12/16, and again on 12/20.  He comes in today requesting a cortisone shot and wanting his knee tapped.  He is able to move his knee and is able to ambulate.  No injury since 08/16/11.  Patient is a 77 y.o. male presenting with knee pain. The history is provided by the patient.  Knee Pain Associated symptoms include joint swelling. Pertinent negatives include no chills, diaphoresis, fever, numbness, rash or weakness. The symptoms are aggravated by walking.    Past Medical History  Diagnosis Date  . Pneumonia   . Lymphoma     s/p partial gastrectomy  . Hypertension   . Hypercholesteremia   . Diabetes mellitus   . Gout   . Irregular heart beat     Past Surgical History  Procedure Date  . Partial gastrectomy 1994    for lymphoma  . Hernia repair   . Joint replacement   . Back surgery     History reviewed. No pertinent family history.  History  Substance Use Topics  . Smoking status: Never Smoker   . Smokeless tobacco: Never Used  . Alcohol Use: No      Review of Systems  Constitutional: Negative for fever, chills and diaphoresis.  Musculoskeletal: Positive for joint swelling. Negative for gait problem.  Skin: Negative for rash.  Neurological: Negative for weakness and numbness.  Psychiatric/Behavioral: Negative for confusion.    Allergies  Review of patient's allergies indicates no known allergies.  Home Medications   Current Outpatient Rx  Name Route Sig Dispense Refill    . ALLOPURINOL 100 MG PO TABS Oral Take 100 mg by mouth daily.      . ASPIRIN 81 MG PO TABS Oral Take 81 mg by mouth daily.      Marland Kitchen CARVEDILOL 12.5 MG PO TABS Oral Take 12.5 mg by mouth 2 (two) times daily with a meal.      . CEPHALEXIN 500 MG PO CAPS Oral Take 500 mg by mouth 4 (four) times daily. Pt started on 08-22-11 for 7 days     . CYANOCOBALAMIN 100 MCG PO TABS Oral Take 100 mcg by mouth daily.      . CYCLOBENZAPRINE HCL 10 MG PO TABS Oral Take 10 mg by mouth 3 (three) times daily as needed. Muscle spasm    . ERGOCALCIFEROL 50000 UNITS PO CAPS Oral Take 50,000 Units by mouth every 14 (fourteen) days.      . FERROUS SULFATE 325 (65 FE) MG PO TABS Oral Take 325 mg by mouth daily with breakfast.      . FUROSEMIDE 20 MG PO TABS Oral Take 20 mg by mouth daily.      Marland Kitchen GLIPIZIDE 10 MG PO TABS Oral Take 10 mg by mouth daily.      Marland Kitchen HYDROCODONE-ACETAMINOPHEN 5-325 MG PO TABS Oral Take 1-2 tablets by mouth every 6 (six) hours as needed. pain    . LISINOPRIL 20 MG PO TABS Oral Take 20 mg by  mouth daily.      . METHYLCELLULOSE 1 % OP SOLN Both Eyes Place 1 drop into both eyes daily.      Marland Kitchen MICONAZOLE NITRATE 2 % EX POWD Topical Apply topically as needed.      Marland Kitchen NIACIN 500 MG PO TABS Oral Take 500 mg by mouth 2 (two) times daily with a meal.     . PANTOPRAZOLE SODIUM 40 MG PO TBEC Oral Take 40 mg by mouth daily.      Marland Kitchen PIOGLITAZONE HCL 30 MG PO TABS Oral Take 30 mg by mouth daily.      Marland Kitchen POLYETHYLENE GLYCOL 3350 PO PACK Oral Take 17 g by mouth daily.      Marland Kitchen RANITIDINE HCL 150 MG PO CAPS Oral Take 150 mg by mouth 2 (two) times daily.      . SENNOSIDES 8.6 MG PO TABS Oral Take 2 tablets by mouth 2 (two) times daily.      . SERTRALINE HCL 100 MG PO TABS Oral Take 200 mg by mouth daily.      Marland Kitchen SIMVASTATIN 10 MG PO TABS Oral Take 10 mg by mouth at bedtime.        BP 129/63  Pulse 69  Resp 20  SpO2 100%  Physical Exam  Nursing note and vitals reviewed. Constitutional: He is oriented to person,  place, and time. He appears well-developed and well-nourished. No distress.  HENT:  Head: Normocephalic and atraumatic.  Neck: Normal range of motion. Neck supple.  Cardiovascular: Normal rate, regular rhythm and normal heart sounds.   Pulmonary/Chest: Effort normal and breath sounds normal. No respiratory distress. He has no wheezes.  Musculoskeletal: Normal range of motion.       Left knee: He exhibits swelling. He exhibits no effusion, no ecchymosis and no erythema.       Tenderness to palpation and mild swelling of the medial portion of the left knee.   Full ROM Abrasion on the left knee and right knee appears to be healing.  Granulation tissue  Neurological: He is alert and oriented to person, place, and time.  Skin: Skin is warm and dry. He is not diaphoretic.  Psychiatric: He has a normal mood and affect.    ED Course  Procedures (including critical care time)  Labs Reviewed  GLUCOSE, CAPILLARY - Abnormal; Notable for the following:    Glucose-Capillary 62 (*)    All other components within normal limits   No results found.   1. Knee pain       MDM  Patient with past negative xrays.  No injury since xrays were done.  Knee pain no different today.  No obvious effusion.  Therefore, feel that tapping the knee is not indicated.  Instructed patient to follow up with PCP.        Pascal Lux Wingen 08/28/11 0201

## 2011-08-27 NOTE — ED Notes (Signed)
Fell 2 weeks ago,

## 2011-08-28 NOTE — ED Provider Notes (Signed)
Medical screening examination/treatment/procedure(s) were conducted as a shared visit with non-physician practitioner(s) and myself.  I personally evaluated the patient during the encounter  Flint Melter, MD 08/28/11 1220

## 2011-10-12 ENCOUNTER — Emergency Department (HOSPITAL_COMMUNITY)
Admission: EM | Admit: 2011-10-12 | Discharge: 2011-10-12 | Disposition: A | Discharge status: Home / Self Care | Payer: Medicare Other | Attending: Emergency Medicine | Admitting: Emergency Medicine

## 2011-10-12 ENCOUNTER — Encounter (HOSPITAL_COMMUNITY): Payer: Self-pay | Admitting: *Deleted

## 2011-10-12 DIAGNOSIS — E7800 Pure hypercholesterolemia, unspecified: Secondary | ICD-10-CM | POA: Insufficient documentation

## 2011-10-12 DIAGNOSIS — Z8639 Personal history of other endocrine, nutritional and metabolic disease: Secondary | ICD-10-CM | POA: Insufficient documentation

## 2011-10-12 DIAGNOSIS — H6120 Impacted cerumen, unspecified ear: Secondary | ICD-10-CM | POA: Insufficient documentation

## 2011-10-12 DIAGNOSIS — K625 Hemorrhage of anus and rectum: Secondary | ICD-10-CM

## 2011-10-12 DIAGNOSIS — K6289 Other specified diseases of anus and rectum: Secondary | ICD-10-CM | POA: Insufficient documentation

## 2011-10-12 DIAGNOSIS — Z862 Personal history of diseases of the blood and blood-forming organs and certain disorders involving the immune mechanism: Secondary | ICD-10-CM | POA: Insufficient documentation

## 2011-10-12 DIAGNOSIS — K644 Residual hemorrhoidal skin tags: Secondary | ICD-10-CM | POA: Insufficient documentation

## 2011-10-12 DIAGNOSIS — K921 Melena: Secondary | ICD-10-CM | POA: Insufficient documentation

## 2011-10-12 DIAGNOSIS — Z79899 Other long term (current) drug therapy: Secondary | ICD-10-CM | POA: Insufficient documentation

## 2011-10-12 DIAGNOSIS — K439 Ventral hernia without obstruction or gangrene: Secondary | ICD-10-CM | POA: Insufficient documentation

## 2011-10-12 DIAGNOSIS — E119 Type 2 diabetes mellitus without complications: Secondary | ICD-10-CM | POA: Insufficient documentation

## 2011-10-12 DIAGNOSIS — I10 Essential (primary) hypertension: Secondary | ICD-10-CM | POA: Insufficient documentation

## 2011-10-12 LAB — CBC
HCT: 35.8 % — ABNORMAL LOW (ref 39.0–52.0)
MCH: 31.9 pg (ref 26.0–34.0)
MCV: 94.5 fL (ref 78.0–100.0)
RBC: 3.79 MIL/uL — ABNORMAL LOW (ref 4.22–5.81)
WBC: 4.2 10*3/uL (ref 4.0–10.5)

## 2011-10-12 LAB — DIFFERENTIAL
Eosinophils Relative: 4 % (ref 0–5)
Lymphocytes Relative: 42 % (ref 12–46)
Lymphs Abs: 1.8 10*3/uL (ref 0.7–4.0)
Monocytes Absolute: 0.5 10*3/uL (ref 0.1–1.0)
Monocytes Relative: 12 % (ref 3–12)

## 2011-10-12 LAB — BASIC METABOLIC PANEL
BUN: 35 mg/dL — ABNORMAL HIGH (ref 6–23)
CO2: 25 mEq/L (ref 19–32)
Calcium: 9.2 mg/dL (ref 8.4–10.5)
Creatinine, Ser: 1.81 mg/dL — ABNORMAL HIGH (ref 0.50–1.35)
Glucose, Bld: 92 mg/dL (ref 70–99)

## 2011-10-12 LAB — PROTIME-INR: INR: 1.03 (ref 0.00–1.49)

## 2011-10-12 MED ORDER — OXYCODONE-ACETAMINOPHEN 5-325 MG PO TABS
1.0000 | ORAL_TABLET | Freq: Once | ORAL | Status: AC
  Administered 2011-10-12: 1 via ORAL
  Filled 2011-10-12: qty 1

## 2011-10-12 MED ORDER — BISACODYL 5 MG PO TBEC: 5.0000 mg | DELAYED_RELEASE_TABLET | Freq: Two times a day (BID) | ORAL | Status: AC

## 2011-10-12 NOTE — ED Provider Notes (Signed)
Medical screening examination/treatment/procedure(s) were conducted as a shared visit with non-physician practitioner(s) and myself.  I personally evaluated the patient during the encounter  Patient is small amount of rectal bleeding today makes him with his stool. This bright red blood. He said similar episodes in the past associated with hemorrhoids. He denies associated lightheadedness, chest pain, shortness of breath, abdominal pain. He is on MiraLAX and senna  No abd tenderness. Regular rate and rhythm. Bilateral cerumen impaction  CBC, coags. Irrigate ears.  Dc home with increased stool softeners and instructions to follow up with Rowan Blase, MD 10/12/11 [phone]

## 2011-10-12 NOTE — ED Provider Notes (Signed)
History     CSN: [id number]  Arrival date & time 10/12/11  4098   First MD Initiated Contact with Patient 10/12/11 [phone]      Chief Complaint  Patient presents with  . Rectal Bleeding    (Consider location/radiation/quality/duration/timing/severity/associated sxs/prior treatment) HPI Comments: Patient comes in today complaining of rectal bleeding.  He reports that he noticed a small amount of bright red blood with his bowel movement this morning.  He reports that he has had rectal bleeding in the past, which have been associated with hemorrhoids.  Patient takes a daily stool softener and daily laxative.  He reports that he notice a hemorrhoid starting to form yesterday.   He denies any melena.  No nausea or vomiting.  Denies abdominal pain.  Patient takes daily aspirin, but does not take any other blood thinners.  Patient is a 78 y.o. male presenting with hematochezia. The history is provided by the patient.  Rectal Bleeding  The current episode started today. The pain is mild. Associated symptoms include rectal pain. Pertinent negatives include no fever, no abdominal pain, no diarrhea, no hematemesis, no nausea, no vomiting, no hematuria and no chest pain.    Past Medical History  Diagnosis Date  . Pneumonia   . Lymphoma     s/p partial gastrectomy  . Hypertension   . Hypercholesteremia   . Diabetes mellitus   . Gout   . Irregular heart beat     Past Surgical History  Procedure Date  . Partial gastrectomy 1994    for lymphoma  . Hernia repair   . Joint replacement   . Back surgery     History reviewed. No pertinent family history.  History  Substance Use Topics  . Smoking status: Never Smoker   . Smokeless tobacco: Never Used  . Alcohol Use: No      Review of Systems  Constitutional: Negative for fever and chills.  Eyes: Negative for visual disturbance.  Respiratory: Negative for shortness of breath.   Cardiovascular: Negative for chest pain.    Gastrointestinal: Positive for hematochezia and rectal pain. Negative for nausea, vomiting, abdominal pain, diarrhea, constipation and hematemesis.  Genitourinary: Negative for dysuria and hematuria.  Skin: Negative for color change.  Neurological: Negative for dizziness, syncope and light-headedness.    Allergies  Review of patient's allergies indicates no known allergies.  Home Medications   Current Outpatient Rx  Name Route Sig Dispense Refill  . ALLOPURINOL 100 MG PO TABS Oral Take 100 mg by mouth daily.      . ASPIRIN 81 MG PO TABS Oral Take 81 mg by mouth daily.      Marland Kitchen CARVEDILOL 12.5 MG PO TABS Oral Take 12.5 mg by mouth 2 (two) times daily with a meal.      . CYANOCOBALAMIN 100 MCG PO TABS Oral Take 100 mcg by mouth daily.      . CYCLOBENZAPRINE HCL 10 MG PO TABS Oral Take 10 mg by mouth 3 (three) times daily as needed. Muscle spasm    . FERROUS SULFATE 325 (65 FE) MG PO TABS Oral Take 325 mg by mouth daily with breakfast.      . FUROSEMIDE 20 MG PO TABS Oral Take 20 mg by mouth daily.      Marland Kitchen GLIPIZIDE 10 MG PO TABS Oral Take 10 mg by mouth daily.      Marland Kitchen HYDROCODONE-ACETAMINOPHEN 5-325 MG PO TABS Oral Take 1-2 tablets by mouth every 6 (six) hours as needed. pain    .  LISINOPRIL 20 MG PO TABS Oral Take 20 mg by mouth daily.      . METHYLCELLULOSE 1 % OP SOLN Both Eyes Place 1 drop into both eyes daily.      Marland Kitchen MICONAZOLE NITRATE 2 % EX POWD Topical Apply topically as needed.      Marland Kitchen NIACIN 500 MG PO TABS Oral Take 500 mg by mouth 2 (two) times daily with a meal.     . PANTOPRAZOLE SODIUM 40 MG PO TBEC Oral Take 40 mg by mouth daily.      Marland Kitchen PIOGLITAZONE HCL 30 MG PO TABS Oral Take 30 mg by mouth daily.      Marland Kitchen POLYETHYLENE GLYCOL 3350 PO PACK Oral Take 17 g by mouth daily.      Marland Kitchen RANITIDINE HCL 150 MG PO CAPS Oral Take 150 mg by mouth 2 (two) times daily.      . SENNOSIDES 8.6 MG PO TABS Oral Take 2 tablets by mouth 2 (two) times daily.      . SERTRALINE HCL 100 MG PO TABS Oral  Take 200 mg by mouth daily.      Marland Kitchen SIMVASTATIN 10 MG PO TABS Oral Take 10 mg by mouth at bedtime.      Marland Kitchen BISACODYL 5 MG PO TBEC Oral Take 1 tablet (5 mg total) by mouth 2 (two) times daily. 14 tablet 0  . ERGOCALCIFEROL 50000 UNITS PO CAPS Oral Take 50,000 Units by mouth every 14 (fourteen) days. Usually on Monday      BP 149/85  Pulse 63  Temp(Src) 97.9 F (36.6 C) (Oral)  Resp 16  SpO2 100%  Physical Exam  Nursing note and vitals reviewed. Constitutional: He is oriented to person, place, and time. He appears well-developed and well-nourished. No distress.  HENT:  Head: Normocephalic and atraumatic.  Right Ear: External ear normal.  Left Ear: External ear normal.       Large amount of cerumen in both ears without complete occlusion.  Neck: Normal range of motion. Neck supple.  Cardiovascular: Normal rate, regular rhythm and normal heart sounds.   Pulmonary/Chest: Effort normal and breath sounds normal. No respiratory distress.  Abdominal: Soft.         Large abdominal scar from previous surgery Abdominal hernia   Genitourinary: Rectal exam shows external hemorrhoid and tenderness. Rectal exam shows no fissure and no mass. Guaiac positive stool.       Non thrombosed external hemorrhoid located at 3 o clock position.  Neurological: He is alert and oriented to person, place, and time.  Skin: Skin is warm and dry. He is not diaphoretic. No erythema.  Psychiatric: He has a normal mood and affect.    ED Course  Procedures (including critical care time)  Labs Reviewed  CBC - Abnormal; Notable for the following:    RBC 3.79 (*)    Hemoglobin 12.1 (*)    HCT 35.8 (*)    All other components within normal limits  DIFFERENTIAL - Abnormal; Notable for the following:    Neutrophils Relative 42 (*)    All other components within normal limits  BASIC METABOLIC PANEL - Abnormal; Notable for the following:    BUN 35 (*)    Creatinine, Ser 1.81 (*)    GFR calc non Af Amer 34 (*)     GFR calc Af Amer 40 (*)    All other components within normal limits  OCCULT BLOOD, POC DEVICE  PROTIME-INR  OCCULT BLOOD X 1 CARD TO LAB,  STOOL   No results found.   1. External hemorrhoid   2. Rectal bleeding       MDM  Patient with small amount of bright red rectal bleeding that he first noticed today.  Patient with external hemorrhoid visualized on physical exam.  No gross blood on rectal exam.  Hgb 12.1 today.  Therefore, feel that patient can be discharged home.  Patient has an appointment with PCP scheduled in 3 days.  Patient given referral to GI.  Patient recommended to take Miralax twice a day instead of once a day and to take Dulcolax daily.  Patient verbalizes understanding and is in agreement with the plan.        Pascal Lux [place], PA-C 10/12/11 [phone]

## 2011-10-12 NOTE — ED Notes (Signed)
Per pt: woke up this morning and noticed bright red blood in his stool. Pt unable to fully describe whether toilet was full of blood or whether it was a faint amount of blood. Pt does use stool softeners for constipation related to [place] use. Pt states that he has a history of hemoriods. Pt denies straining to use bathroom. Pt denies any pain while sitting any itching or burning currently. Pt is alert and oriented and able to move all extremities. Pt also complaining of ears being stuffy and unable to hear as well as he usually does.  Pt denies blood on his pants, pt still wearing underwear.

## 2011-10-12 NOTE — ED Notes (Signed)
Removal of ear wax successful; luke warm peroxide and water used to irrigate ears. PA removed ear wax with adult curettes.

## 2011-10-22 ENCOUNTER — Emergency Department (HOSPITAL_COMMUNITY)
Admission: EM | Admit: 2011-10-22 | Discharge: 2011-10-23 | Disposition: A | Discharge status: Home / Self Care | Payer: Non-veteran care | Attending: Emergency Medicine | Admitting: Emergency Medicine

## 2011-10-22 DIAGNOSIS — M25569 Pain in unspecified knee: Secondary | ICD-10-CM | POA: Insufficient documentation

## 2011-10-22 DIAGNOSIS — Z87898 Personal history of other specified conditions: Secondary | ICD-10-CM | POA: Insufficient documentation

## 2011-10-22 DIAGNOSIS — K5732 Diverticulitis of large intestine without perforation or abscess without bleeding: Secondary | ICD-10-CM | POA: Insufficient documentation

## 2011-10-22 DIAGNOSIS — I2510 Atherosclerotic heart disease of native coronary artery without angina pectoris: Secondary | ICD-10-CM | POA: Insufficient documentation

## 2011-10-22 DIAGNOSIS — R109 Unspecified abdominal pain: Secondary | ICD-10-CM | POA: Insufficient documentation

## 2011-10-22 DIAGNOSIS — K439 Ventral hernia without obstruction or gangrene: Secondary | ICD-10-CM | POA: Insufficient documentation

## 2011-10-22 DIAGNOSIS — K5792 Diverticulitis of intestine, part unspecified, without perforation or abscess without bleeding: Secondary | ICD-10-CM

## 2011-10-22 DIAGNOSIS — M109 Gout, unspecified: Secondary | ICD-10-CM | POA: Insufficient documentation

## 2011-10-22 DIAGNOSIS — I10 Essential (primary) hypertension: Secondary | ICD-10-CM | POA: Insufficient documentation

## 2011-10-22 DIAGNOSIS — E119 Type 2 diabetes mellitus without complications: Secondary | ICD-10-CM | POA: Insufficient documentation

## 2011-10-22 HISTORY — DX: Calculus of kidney: N20.0

## 2011-10-23 ENCOUNTER — Encounter (HOSPITAL_COMMUNITY): Payer: Self-pay | Admitting: Emergency Medicine

## 2011-10-23 ENCOUNTER — Emergency Department (HOSPITAL_COMMUNITY): Payer: Non-veteran care

## 2011-10-23 DIAGNOSIS — N200 Calculus of kidney: Secondary | ICD-10-CM | POA: Insufficient documentation

## 2011-10-23 LAB — CBC
HCT: 37.1 % — ABNORMAL LOW (ref 39.0–52.0)
Hemoglobin: 13 g/dL (ref 13.0–17.0)
MCH: 32.7 pg (ref 26.0–34.0)
MCHC: 35 g/dL (ref 30.0–36.0)
MCV: 93.2 fL (ref 78.0–100.0)

## 2011-10-23 LAB — DIFFERENTIAL
Lymphs Abs: 2.1 10*3/uL (ref 0.7–4.0)
Monocytes Absolute: 0.5 10*3/uL (ref 0.1–1.0)
Monocytes Relative: 10 % (ref 3–12)
Neutro Abs: 2.4 10*3/uL (ref 1.7–7.7)
Neutrophils Relative %: 46 % (ref 43–77)

## 2011-10-23 LAB — COMPREHENSIVE METABOLIC PANEL
Albumin: 3.6 g/dL (ref 3.5–5.2)
Alkaline Phosphatase: 142 U/L — ABNORMAL HIGH (ref 39–117)
BUN: 28 mg/dL — ABNORMAL HIGH (ref 6–23)
CO2: 26 mEq/L (ref 19–32)
Chloride: 101 mEq/L (ref 96–112)
Creatinine, Ser: 1.54 mg/dL — ABNORMAL HIGH (ref 0.50–1.35)
GFR calc Af Amer: 48 mL/min — ABNORMAL LOW (ref >90)
GFR calc non Af Amer: 42 mL/min — ABNORMAL LOW (ref >90)
Glucose, Bld: 103 mg/dL — ABNORMAL HIGH (ref 70–99)
Potassium: 5 mEq/L (ref 3.5–5.1)
Total Bilirubin: 0.5 mg/dL (ref 0.3–1.2)

## 2011-10-23 LAB — URINALYSIS, ROUTINE W REFLEX MICROSCOPIC
Glucose, UA: NEGATIVE mg/dL
Ketones, ur: NEGATIVE mg/dL
Leukocytes, UA: NEGATIVE
Protein, ur: 100 mg/dL — AB
Urobilinogen, UA: 0.2 mg/dL (ref 0.0–1.0)

## 2011-10-23 LAB — URINE MICROSCOPIC-ADD ON

## 2011-10-23 MED ORDER — ONDANSETRON HCL 4 MG/2ML IJ SOLN
4.0000 mg | Freq: Once | INTRAMUSCULAR | Status: AC
  Administered 2011-10-23: 4 mg via INTRAVENOUS
  Filled 2011-10-23: qty 2

## 2011-10-23 MED ORDER — MORPHINE SULFATE 4 MG/ML IJ SOLN
4.0000 mg | Freq: Once | INTRAMUSCULAR | Status: AC
  Administered 2011-10-23: 4 mg via INTRAVENOUS
  Filled 2011-10-23: qty 1

## 2011-10-23 MED ORDER — CIPROFLOXACIN HCL 500 MG PO TABS
500.0000 mg | ORAL_TABLET | Freq: Once | ORAL | Status: AC
Start: 2011-10-23 — End: 2011-10-23
  Administered 2011-10-23: 500 mg via ORAL
  Filled 2011-10-23: qty 1

## 2011-10-23 MED ORDER — HYDROCODONE-ACETAMINOPHEN 5-325 MG PO TABS: 1.0000 | ORAL_TABLET | Freq: Four times a day (QID) | ORAL | Status: AC | PRN

## 2011-10-23 MED ORDER — SODIUM CHLORIDE 0.9 % IV SOLN
20.0000 mL | INTRAVENOUS | Status: DC
  Administered 2011-10-23: 20 mL via INTRAVENOUS

## 2011-10-23 MED ORDER — METRONIDAZOLE 500 MG PO TABS
500.0000 mg | ORAL_TABLET | Freq: Once | ORAL | Status: AC
  Administered 2011-10-23: 500 mg via ORAL
  Filled 2011-10-23: qty 1

## 2011-10-23 MED ORDER — CIPROFLOXACIN HCL 500 MG PO TABS: 500.0000 mg | ORAL_TABLET | Freq: Two times a day (BID) | ORAL | Status: AC

## 2011-10-23 MED ORDER — METRONIDAZOLE 500 MG PO TABS: 500.0000 mg | ORAL_TABLET | Freq: Three times a day (TID) | ORAL | Status: AC

## 2011-10-23 NOTE — ED Notes (Signed)
Pt. Changed into gown and given warm blanket for comfort.

## 2011-10-23 NOTE — Discharge Instructions (Signed)
Arthritis - Gout Gout is caused by uric acid crystals forming in the joint. The big toe, foot, ankle, and knee are the joints most often affected.Often uric acid levels in the blood are also elevated. Symptoms develop rapidly, usually over hours. A joint fluid exam may be needed to prove the diagnosis. Acute gout episodes may follow a minor injury, surgery, illness or medication change. Gout occurs more often in men. It tends to be an inherited (passed down from a family member) condition. Your chance of having gout is increased if you take certain medications. These include water pills (diuretics), aspirin, niacin, and cyclosporine. Kidney disease and alcohol consumption may also increase your chances of getting gout. Months or years can pass between gout attacks.  Treatment includes ice, rest and raising the affected limb until the swelling and pain are better.Anti-inflammatory medicine usually brings about dramatic relief of pain, redness and swelling within 2 to 3 days. Other medications can also be effective. Increase your fluid intake. Do not drink alcohol or eat liver, sweetbreads, or sardines. Long-term gout management may require medicine to lower blood uric acid levels, or changing or stopping diuretics. Please see your caregiver if your condition is not better after 1 to 2 days of treatment.  SEEK IMMEDIATE MEDICAL CARE IF:  You have more serious symptoms such as a fever, skin rash, diarrhea, vomiting, or other joint pains. Document Released: 09/25/2004 Document Revised: 04/30/2011 Document Reviewed: 10/09/2008 [facility] Patient Information 2012 [place], [place].Diverticulitis A diverticulum is a small pouch or sac on the colon. Diverticulosis is the presence of these diverticula on the colon. Diverticulitis is the irritation (inflammation) or infection of diverticula. CAUSES  The colon and its diverticula contain bacteria. If food particles block the tiny opening to a diverticulum, the bacteria  inside can grow and cause an increase in pressure. This leads to infection and inflammation and is called diverticulitis. SYMPTOMS   Abdominal pain and tenderness. Usually, the pain is located on the left side of your abdomen. However, it could be located elsewhere.   Fever.   Bloating.   Feeling sick to your stomach (nausea).   Throwing up (vomiting).   Abnormal stools.  DIAGNOSIS  Your caregiver will take a history and perform a physical exam. Since many things can cause abdominal pain, other tests may be necessary. Tests may include:  Blood tests.   Urine tests.   X-ray of the abdomen.   CT scan of the abdomen.  Sometimes, surgery is needed to determine if diverticulitis or other conditions are causing your symptoms. TREATMENT  Most of the time, you can be treated without surgery. Treatment includes:  Resting the bowels by only having liquids for a few days. As you improve, you will need to eat a low-fiber diet.   Intravenous (IV) fluids if you are losing body fluids (dehydrated).   Antibiotic medicines that treat infections may be given.   Pain and nausea medicine, if needed.   Surgery if the inflamed diverticulum has burst.  HOME CARE INSTRUCTIONS   Try a clear liquid diet (broth, tea, or water for as long as directed by your caregiver). You may then gradually begin a low-fiber diet as tolerated. A low-fiber diet is a diet with less than 10 grams of fiber. Choose the foods below to reduce fiber in the diet:   White breads, cereals, rice, and pasta.   Cooked fruits and vegetables or soft fresh fruits and vegetables without the skin.   Ground or well-cooked tender beef, ham,  veal, lamb, pork, or poultry.   Eggs and seafood.   After your diverticulitis symptoms have improved, your caregiver may put you on a high-fiber diet. A high-fiber diet includes 14 grams of fiber for every 1000 calories consumed. For a standard 2000 calorie diet, you would need 28 grams of  fiber. Follow these diet guidelines to help you increase the fiber in your diet. It is important to slowly increase the amount fiber in your diet to avoid gas, constipation, and bloating.   Choose whole-grain breads, cereals, pasta, and brown rice.   Choose fresh fruits and vegetables with the skin on. Do not overcook vegetables because the more vegetables are cooked, the more fiber is lost.   Choose more nuts, seeds, legumes, dried peas, beans, and lentils.   Look for food products that have greater than 3 grams of fiber per serving on the Nutrition Facts label.   Take all medicine as directed by your caregiver.   If your caregiver has given you a follow-up appointment, it is very important that you go. Not going could result in lasting (chronic) or permanent injury, pain, and disability. If there is any problem keeping the appointment, call to reschedule.  SEEK MEDICAL CARE IF:   Your pain does not improve.   You have a hard time advancing your diet beyond clear liquids.   Your bowel movements do not return to normal.  SEEK IMMEDIATE MEDICAL CARE IF:   Your pain becomes worse.   You have an oral temperature above 102 F (38.9 C), not controlled by medicine.   You have repeated vomiting.   You have bloody or black, tarry stools.   Symptoms that brought you to your caregiver become worse or are not getting better.  MAKE SURE YOU:   Understand these instructions.   Will watch your condition.   Will get help right away if you are not doing well or get worse.  Document Released: 05/28/2005 Document Revised: 04/30/2011 Document Reviewed: 09/23/2010 [facility] Patient Information 2012 [place], [place].

## 2011-10-23 NOTE — ED Notes (Signed)
Patient requesting something for pain. MD notified.

## 2011-10-23 NOTE — ED Notes (Signed)
Patient c/o lower back pain, urine clear sent to lab, has had kidney stones in the past

## 2011-10-23 NOTE — ED Notes (Signed)
Patient complaining of left sided flank pain for the past two to three days; patient states that he has a history of kidney stones and that this feels similar.  Denies nausea and vomiting.

## 2011-10-23 NOTE — ED Provider Notes (Signed)
History     CSN: [id number]  Arrival date & time 10/22/11  2314   First MD Initiated Contact with Patient 10/23/11 0138      Chief Complaint  Patient presents with  . Flank Pain    (Consider location/radiation/quality/duration/timing/severity/associated sxs/prior treatment) Patient is a 78 y.o. male presenting with flank pain. The history is provided by the patient.  Flank Pain This is a recurrent problem. The current episode started more than 2 days ago. The problem occurs constantly. The problem has not changed since onset.Pertinent negatives include no chest pain, no abdominal pain, no headaches and no shortness of breath. Exacerbated by: certain movements. The symptoms are relieved by nothing. He has tried nothing for the symptoms.  Pt has had kidney stones before and wonders if this is what is going on.  He does have pain with certain positions and he has had this problem before with weather changes.  He also has some right knee pain and swelling without acute injury.  Past Medical History  Diagnosis Date  . Pneumonia   . Lymphoma     s/p partial gastrectomy  . Hypertension   . Hypercholesteremia   . Diabetes mellitus   . Gout   . Irregular heart beat   . Kidney stone     Past Surgical History  Procedure Date  . Partial gastrectomy 1994    for lymphoma  . Hernia repair   . Joint replacement   . Back surgery     History reviewed. No pertinent family history.  History  Substance Use Topics  . Smoking status: Never Smoker   . Smokeless tobacco: Never Used  . Alcohol Use: No      Review of Systems  Constitutional: Negative for fever and appetite change.  Respiratory: Negative for shortness of breath.   Cardiovascular: Negative for chest pain.  Gastrointestinal: Negative for nausea, vomiting, abdominal pain, diarrhea and constipation.  Genitourinary: Positive for flank pain. Negative for hematuria.  Musculoskeletal: Positive for back pain.  Neurological:  Negative for headaches.    Allergies  Review of patient's allergies indicates no known allergies.  Home Medications   Current Outpatient Rx  Name Route Sig Dispense Refill  . ALLOPURINOL 100 MG PO TABS Oral Take 100 mg by mouth daily.      . ASPIRIN 81 MG PO TABS Oral Take 81 mg by mouth daily.      Marland Kitchen BISACODYL 5 MG PO TBEC Oral Take 1 tablet (5 mg total) by mouth 2 (two) times daily. 14 tablet 0  . CARVEDILOL 12.5 MG PO TABS Oral Take 12.5 mg by mouth 2 (two) times daily with a meal.      . CYANOCOBALAMIN 100 MCG PO TABS Oral Take 100 mcg by mouth daily.      . CYCLOBENZAPRINE HCL 10 MG PO TABS Oral Take 10 mg by mouth 3 (three) times daily as needed. Muscle spasm    . ERGOCALCIFEROL 50000 UNITS PO CAPS Oral Take 50,000 Units by mouth every 14 (fourteen) days. Usually on Monday    . FERROUS SULFATE 325 (65 FE) MG PO TABS Oral Take 325 mg by mouth daily with breakfast.      . FUROSEMIDE 20 MG PO TABS Oral Take 20 mg by mouth daily.      Marland Kitchen GLIPIZIDE 10 MG PO TABS Oral Take 10 mg by mouth daily.      Marland Kitchen HYDROCODONE-ACETAMINOPHEN 5-325 MG PO TABS Oral Take 1-2 tablets by mouth every 6 (six) hours as  needed. pain    . LISINOPRIL 20 MG PO TABS Oral Take 20 mg by mouth daily.      . METHYLCELLULOSE 1 % OP SOLN Both Eyes Place 1 drop into both eyes daily.      Marland Kitchen MICONAZOLE NITRATE 2 % EX POWD Topical Apply topically as needed.      Marland Kitchen NIACIN 500 MG PO TABS Oral Take 500 mg by mouth 2 (two) times daily with a meal.     . PANTOPRAZOLE SODIUM 40 MG PO TBEC Oral Take 40 mg by mouth daily.      Marland Kitchen PIOGLITAZONE HCL 30 MG PO TABS Oral Take 30 mg by mouth daily.      Marland Kitchen POLYETHYLENE GLYCOL 3350 PO PACK Oral Take 17 g by mouth daily.      Marland Kitchen RANITIDINE HCL 150 MG PO CAPS Oral Take 150 mg by mouth 2 (two) times daily.      . SENNOSIDES 8.6 MG PO TABS Oral Take 2 tablets by mouth 2 (two) times daily.      . SERTRALINE HCL 100 MG PO TABS Oral Take 200 mg by mouth daily.      Marland Kitchen SIMVASTATIN 10 MG PO TABS Oral  Take 10 mg by mouth at bedtime.        BP 153/80  Pulse 68  Temp(Src) 97.6 F (36.4 C) (Oral)  Resp 20  SpO2 98%  Physical Exam  Nursing note and vitals reviewed. Constitutional: He appears well-developed and well-nourished. No distress.  HENT:  Head: Normocephalic and atraumatic.  Right Ear: External ear normal.  Left Ear: External ear normal.  Eyes: Conjunctivae are normal. Right eye exhibits no discharge. Left eye exhibits no discharge. No scleral icterus.  Neck: Neck supple. No tracheal deviation present.  Cardiovascular: Normal rate, regular rhythm and intact distal pulses.   Pulmonary/Chest: Effort normal and breath sounds normal. No stridor. No respiratory distress. He has no wheezes. He has no rales.  Abdominal: Soft. Bowel sounds are normal. He exhibits no distension. There is no tenderness. There is CVA tenderness (left sided). There is no rebound and no guarding. A hernia is present. Hernia confirmed positive in the ventral area.  Musculoskeletal: He exhibits no edema and no tenderness.       Right knee: He exhibits swelling and effusion. He exhibits no erythema.  Neurological: He is alert. He has normal strength. No sensory deficit. Cranial nerve deficit:  no gross defecits noted. He exhibits normal muscle tone. He displays no seizure activity. Coordination normal.  Skin: Skin is warm and dry. No rash noted.  Psychiatric: He has a normal mood and affect.    ED Course  Procedures (including critical care time)  Labs Reviewed  CBC - Abnormal; Notable for the following:    RBC 3.98 (*)    HCT 37.1 (*)    All other components within normal limits  COMPREHENSIVE METABOLIC PANEL - Abnormal; Notable for the following:    Sodium 133 (*)    Glucose, Bld 103 (*)    BUN 28 (*)    Creatinine, Ser 1.54 (*)    AST 43 (*)    Alkaline Phosphatase 142 (*)    GFR calc non Af Amer 42 (*)    GFR calc Af Amer 48 (*)    All other components within normal limits  URINALYSIS,  ROUTINE W REFLEX MICROSCOPIC - Abnormal; Notable for the following:    Protein, ur 100 (*)    All other components within normal limits  DIFFERENTIAL  LIPASE, BLOOD  URINE MICROSCOPIC-ADD ON   Ct Abdomen Pelvis Wo Contrast  10/23/2011  *RADIOLOGY REPORT*  Clinical Data: Left-sided flank pain for 3 days; history of renal stones and lymphoma.  CT ABDOMEN AND PELVIS WITHOUT CONTRAST  Technique:  Multidetector CT imaging of the abdomen and pelvis was performed following the standard protocol without intravenous contrast.  Comparison: CT of the abdomen and pelvis performed 10/25/2010  Findings: Minimal bibasilar atelectasis is noted.  Diffuse coronary artery calcifications are noted.  A tiny hiatal hernia is noted.  A tiny calcified granuloma is noted within the right hepatic lobe. The liver is otherwise unremarkable in appearance.  The spleen is normal in appearance.  The pancreas demonstrates mild fatty infiltration, but remains within normal limits.  The gallbladder is somewhat contracted; apparent gallbladder wall thickening appears stable from the prior study, and likely reflects the patient's baseline.  The adrenal glands are unremarkable, aside from minimal calcification at the right adrenal gland.  Numerous large bilateral renal cysts are again noted, essentially unchanged from the prior study, with surrounding perinephric stranding.  There is no evidence of hydronephrosis.  No renal or ureteral stones are identified.  No definite suspicious masses are seen.  No free fluid is identified.  There appears to be a small focal Richter hernia along the right mid abdomen, likely involving the mid ileum; there is no evidence of obstruction; the visualized small bowel is otherwise grossly unremarkable.  The patient is status post partial gastrectomy, with gastrojejunostomy.  The anastomosis is unremarkable in appearance. No acute vascular abnormalities are seen.  Mild scattered calcification is noted along the  abdominal aorta and its branches.  The appendix is normal in caliber, without evidence for appendicitis.  Scattered diverticulosis is noted along the entirety of the colon.  There is suggestion of minimal associated soft tissue inflammation along the mid descending colon, raising question for minimal diverticulitis.  There is question of minimal associated free fluid tracking along Gerota's fascia on the left side.  There is no evidence of perforation or abscess formation.  The bladder is mildly distended and grossly unremarkable in appearance.  The prostate remains normal in size.  Scattered mildly enlarged bilateral inguinal nodes are seen, measuring up to 1.2 cm in size.  There is also a mildly enlarged, 1.2 cm node at the right mid abdomen adjacent to the small Richter hernia.  An anterior abdominal wall mesh is noted; there is no evidence of recurrent hernia at this location.  No acute osseous abnormalities are identified.  Facet hypertrophy and prominent anterior and lateral osteophytes are noted along the lumbar spine.  IMPRESSION:  1.  Suggestion of minimal acute diverticulitis involving the mid descending colon, with question of minimal associated free fluid. No evidence for perforation or abscess formation. 2.  Scattered diverticulosis involves the entirety of the colon. 3.  Small Richter hernia noted likely at the level of the mid ileum, along the right mid abdomen; associated mildly enlarged 1.2 cm node seen in the soft tissues of the abdominal wall.  No evidence for bowel obstruction. 4.  Status post partial gastrectomy with gastrojejunostomy; anastomosis is unremarkable in appearance. 5.  Mildly enlarged bilateral inguinal nodes seen, measuring up to 1.2 cm in size. 6.  Stable appearance to multiple large renal cysts; no suspicious dominant mass seen. 7.  Tiny hiatal hernia noted. 8.  Diffuse coronary artery calcifications seen. 9.  Scattered calcification along the abdominal aorta and its branches.  10.  Apparent areas  of gallbladder wall thickening seem to reflect the patient's baseline, and are grossly stable from the prior study, taking decompression into account.  Original Report Authenticated By: Tonia Ghent, M.D.      MDM  The patient's CT scan suggests diverticulitis. He does have pain in the left flank area which would be consistent with this finding. There is no evidence of kidney stone or perforation.  The patient also is having some pain associated with his gout. He has been given medications for that. I discussed inpatient versus outpatient treatment. This appears to be a simple and complicated diverticulitis and the patient is not having any trouble with nausea vomiting or fever. I will discharge him home on oral antibiotics and pain medications. Patient is encouraged to followup with his doctor in the next 2 days to be rechecked.        Celene Kras, MD 10/23/11 [phone]

## 2011-10-26 ENCOUNTER — Emergency Department (HOSPITAL_COMMUNITY)
Admission: EM | Admit: 2011-10-26 | Discharge: 2011-10-26 | Disposition: A | Discharge status: Home Health | Payer: Medicare Other | Attending: Emergency Medicine | Admitting: Emergency Medicine

## 2011-10-26 ENCOUNTER — Encounter (HOSPITAL_COMMUNITY): Payer: Self-pay | Admitting: Emergency Medicine

## 2011-10-26 DIAGNOSIS — E119 Type 2 diabetes mellitus without complications: Secondary | ICD-10-CM | POA: Insufficient documentation

## 2011-10-26 DIAGNOSIS — I10 Essential (primary) hypertension: Secondary | ICD-10-CM | POA: Insufficient documentation

## 2011-10-26 DIAGNOSIS — M25461 Effusion, right knee: Secondary | ICD-10-CM

## 2011-10-26 DIAGNOSIS — M25469 Effusion, unspecified knee: Secondary | ICD-10-CM | POA: Insufficient documentation

## 2011-10-26 DIAGNOSIS — R609 Edema, unspecified: Secondary | ICD-10-CM | POA: Insufficient documentation

## 2011-10-26 MED ORDER — HYDROMORPHONE HCL PF 2 MG/ML IJ SOLN
INTRAMUSCULAR | Status: AC
  Administered 2011-10-26: 2 mg via INTRAMUSCULAR
  Filled 2011-10-26: qty 1

## 2011-10-26 MED ORDER — BISACODYL 5 MG PO TBEC: 5.0000 mg | DELAYED_RELEASE_TABLET | Freq: Two times a day (BID) | ORAL | Status: DC

## 2011-10-26 MED ORDER — HYDROMORPHONE HCL PF 2 MG/ML IJ SOLN: 2.0000 mg | Freq: Once | INTRAMUSCULAR | Status: DC

## 2011-10-26 MED ORDER — LIDOCAINE-EPINEPHRINE 1 %-1:100000 IJ SOLN
INTRAMUSCULAR | Status: AC
  Filled 2011-10-26: qty 1

## 2011-10-26 NOTE — ED Notes (Signed)
Patient is AOx4 and comfortable with his discharge instructions. 

## 2011-10-26 NOTE — ED Notes (Signed)
Dr. Fleet Contras in to assess patient.

## 2011-10-26 NOTE — ED Notes (Signed)
Here 2/20 for Diverticulitis and gout flare up.  Rt knee no better.

## 2011-10-26 NOTE — ED Notes (Signed)
Patient also c/o 'my back pain, I think the medicine you all put me on has made things worse'.

## 2011-10-26 NOTE — Discharge Instructions (Signed)
Please call the specialty orthopedist listed above if you do not have an orthopedist. You should be seen by a knee specialist within one week if her symptoms do not continue to improve. If you should develop severe or worsening pain, fever, redness or warmth to the knee, return to the emergency department immediately

## 2011-10-26 NOTE — ED Provider Notes (Signed)
History     CSN: [id number]  Arrival date & time 10/26/11  4132   First MD Initiated Contact with Patient 10/26/11 [phone]      Chief Complaint  Patient presents with  . Joint Swelling    (Consider location/radiation/quality/duration/timing/severity/associated sxs/prior treatment) HPI Comments: Pain and swelling in the R knee - chronic - has gout - is worse since seen several days ago for diverticulitis - denies redness, fevers, vomiting.  Has been on chronic gout meds.  Sx are constant, and is worse with ambulation.  The history is provided by the patient and medical records.    Past Medical History  Diagnosis Date  . Pneumonia   . Lymphoma     s/p partial gastrectomy  . Hypertension   . Hypercholesteremia   . Diabetes mellitus   . Gout   . Irregular heart beat   . Kidney stone     Past Surgical History  Procedure Date  . Partial gastrectomy 1994    for lymphoma  . Hernia repair   . Joint replacement   . Back surgery     No family history on file.  History  Substance Use Topics  . Smoking status: Never Smoker   . Smokeless tobacco: Never Used  . Alcohol Use: No      Review of Systems  Constitutional: Negative for fever and chills.  Gastrointestinal: Negative for nausea and vomiting.  Musculoskeletal: Positive for joint swelling and gait problem. Negative for back pain.  Skin: Negative for rash.    Allergies  Review of patient's allergies indicates no known allergies.  Home Medications   Current Outpatient Rx  Name Route Sig Dispense Refill  . ACETAMINOPHEN 325 MG PO TABS Oral Take 325 mg by mouth every 6 (six) hours as needed.    . ACYCLOVIR 5 % EX OINT Topical Apply 1 application topically 3 (three) times daily. For 5 days started 10/15/2011    . ALLOPURINOL 100 MG PO TABS Oral Take 100 mg by mouth daily.      . ASPIRIN 81 MG PO TABS Oral Take 81 mg by mouth daily.      Marland Kitchen BISACODYL 5 MG PO TBEC Oral Take 1 tablet (5 mg total) by mouth 2 (two) times  daily. 14 tablet 0  . CARVEDILOL 12.5 MG PO TABS Oral Take 12.5 mg by mouth 2 (two) times daily with a meal.     . CIPROFLOXACIN HCL 500 MG PO TABS Oral Take 1 tablet (500 mg total) by mouth 2 (two) times daily. 20 tablet 0  . CYANOCOBALAMIN 100 MCG PO TABS Oral Take 100 mcg by mouth daily.      . CYCLOBENZAPRINE HCL 10 MG PO TABS Oral Take 10 mg by mouth 3 (three) times daily as needed. Muscle spasm    . ERGOCALCIFEROL 50000 UNITS PO CAPS Oral Take 50,000 Units by mouth every 14 (fourteen) days. Usually on Monday      . FERROUS SULFATE 325 (65 FE) MG PO TABS Oral Take 325 mg by mouth daily with breakfast.      . FUROSEMIDE 20 MG PO TABS Oral Take 20 mg by mouth daily.      Marland Kitchen GLIPIZIDE 10 MG PO TABS Oral Take 10 mg by mouth daily.      Marland Kitchen HYDROCODONE-ACETAMINOPHEN 5-325 MG PO TABS Oral Take 1 tablet by mouth every 6 (six) hours as needed for pain. 20 tablet 0  . HYDROCORTISONE ACETATE 25 MG RE SUPP Rectal Place 25 mg rectally  2 (two) times daily.    Marland Kitchen LISINOPRIL 20 MG PO TABS Oral Take 20 mg by mouth daily.      . METHYLCELLULOSE 1 % OP SOLN Both Eyes Place 1 drop into both eyes daily.      Marland Kitchen METRONIDAZOLE 500 MG PO TABS Oral Take 1 tablet (500 mg total) by mouth 3 (three) times daily. 30 tablet 0  . MICONAZOLE NITRATE 2 % EX POWD Topical Apply topically as needed.      Marland Kitchen NIACIN 500 MG PO TABS Oral Take 500 mg by mouth 2 (two) times daily with a meal.     . PANTOPRAZOLE SODIUM 40 MG PO TBEC Oral Take 40 mg by mouth daily.      Marland Kitchen PIOGLITAZONE HCL 30 MG PO TABS Oral Take 30 mg by mouth daily.      Marland Kitchen POLYETHYLENE GLYCOL 3350 PO PACK Oral Take 17 g by mouth daily.      Marland Kitchen RANITIDINE HCL 150 MG PO CAPS Oral Take 150 mg by mouth 2 (two) times daily.      . SENNOSIDES 8.6 MG PO TABS Oral Take 2 tablets by mouth 2 (two) times daily.      . SERTRALINE HCL 100 MG PO TABS Oral Take 200 mg by mouth daily.      Marland Kitchen SIMVASTATIN 10 MG PO TABS Oral Take 10 mg by mouth at bedtime.      . AMOXICILLIN 500 MG PO  CAPS Oral Take 2,000 mg by mouth once. Before dental appointment    . BISACODYL 5 MG PO TBEC Oral Take 1 tablet (5 mg total) by mouth 2 (two) times daily. 14 tablet 0    BP 155/77  Pulse 63  Temp(Src) 98.8 F (37.1 C) (Oral)  Resp 18  SpO2 98%  Physical Exam  Nursing note and vitals reviewed. Constitutional: He appears well-developed and well-nourished. No distress.  HENT:  Head: Normocephalic and atraumatic.  Mouth/Throat: Oropharynx is clear and moist. No oropharyngeal exudate.  Eyes: Conjunctivae and EOM are normal. Pupils are equal, round, and reactive to light. Right eye exhibits no discharge. Left eye exhibits no discharge. No scleral icterus.  Neck: Normal range of motion. Neck supple. No JVD present. No thyromegaly present.  Cardiovascular: Normal rate, regular rhythm, normal heart sounds and intact distal pulses.  Exam reveals no gallop and no friction rub.   No murmur heard. Pulmonary/Chest: Effort normal and breath sounds normal. No respiratory distress. He has no wheezes. He has no rales.  Abdominal: Soft. Bowel sounds are normal. He exhibits no distension and no mass. There is no tenderness.  Musculoskeletal: Normal range of motion. He exhibits edema ( tttp in the R Knee with dec ROM secondiary to swelling and pain. no rash, redness, warmth) and tenderness.  Lymphadenopathy:    He has no cervical adenopathy.  Neurological: He is alert. Coordination normal.  Skin: Skin is warm and dry. No rash noted. No erythema.  Psychiatric: He has a normal mood and affect. His behavior is normal.    ED Course  Procedures (including critical care time)  Procedure note  Arthrocentesis, indication therapeutic  Patient informed of risks benefits and alternatives of the procedure, verbal consent was obtained, patient was laid supine, medial approach taken, skin prepped with Betadine and sterile gauze, sterile conditions maintained throughout.  Local anesthesia with 1% lidocaine with  epinephrine, approximately 2 cc local  With 18-gauge needle, approximately 70 cc of clear yellow viscous fluid removed from the right knee joint  Patient tolerated the procedure without any complaints, sterile dressing placed    Labs Reviewed - No data to display No results found.   1. Effusion of right knee joint       MDM  No signs of erythema or infection, no fever, no tachy, no hypotension.  Has likely severe gout - will do therapeutic tap.  IM dilaudid given.  Pain significantly improved, swelling improved, patient able to move the knee more than once before, again vital signs appear stable and patient is stable for outpatient followup. We'll give orthopedic followup the patient if does not have specialty followup           Vida Roller, MD 10/26/11 0630

## 2011-10-27 ENCOUNTER — Encounter (HOSPITAL_COMMUNITY): Payer: Self-pay | Admitting: *Deleted

## 2011-10-27 ENCOUNTER — Emergency Department (HOSPITAL_COMMUNITY)
Admission: EM | Admit: 2011-10-27 | Discharge: 2011-10-27 | Disposition: A | Discharge status: Home / Self Care | Payer: Non-veteran care | Attending: Emergency Medicine | Admitting: Emergency Medicine

## 2011-10-27 ENCOUNTER — Emergency Department (HOSPITAL_COMMUNITY): Payer: Non-veteran care

## 2011-10-27 DIAGNOSIS — R197 Diarrhea, unspecified: Secondary | ICD-10-CM | POA: Insufficient documentation

## 2011-10-27 DIAGNOSIS — E119 Type 2 diabetes mellitus without complications: Secondary | ICD-10-CM | POA: Insufficient documentation

## 2011-10-27 DIAGNOSIS — R609 Edema, unspecified: Secondary | ICD-10-CM | POA: Insufficient documentation

## 2011-10-27 DIAGNOSIS — Z87442 Personal history of urinary calculi: Secondary | ICD-10-CM | POA: Insufficient documentation

## 2011-10-27 DIAGNOSIS — Z8639 Personal history of other endocrine, nutritional and metabolic disease: Secondary | ICD-10-CM | POA: Insufficient documentation

## 2011-10-27 DIAGNOSIS — Z79899 Other long term (current) drug therapy: Secondary | ICD-10-CM | POA: Insufficient documentation

## 2011-10-27 DIAGNOSIS — E7800 Pure hypercholesterolemia, unspecified: Secondary | ICD-10-CM | POA: Insufficient documentation

## 2011-10-27 DIAGNOSIS — R10819 Abdominal tenderness, unspecified site: Secondary | ICD-10-CM | POA: Insufficient documentation

## 2011-10-27 DIAGNOSIS — Z862 Personal history of diseases of the blood and blood-forming organs and certain disorders involving the immune mechanism: Secondary | ICD-10-CM | POA: Insufficient documentation

## 2011-10-27 DIAGNOSIS — R109 Unspecified abdominal pain: Secondary | ICD-10-CM | POA: Insufficient documentation

## 2011-10-27 DIAGNOSIS — I10 Essential (primary) hypertension: Secondary | ICD-10-CM | POA: Insufficient documentation

## 2011-10-27 LAB — COMPREHENSIVE METABOLIC PANEL
ALT: 20 U/L (ref 0–53)
AST: 32 U/L (ref 0–37)
Albumin: 3.4 g/dL — ABNORMAL LOW (ref 3.5–5.2)
Alkaline Phosphatase: 141 U/L — ABNORMAL HIGH (ref 39–117)
GFR calc Af Amer: 41 mL/min — ABNORMAL LOW (ref >90)
Glucose, Bld: 72 mg/dL (ref 70–99)
Potassium: 3.8 mEq/L (ref 3.5–5.1)
Sodium: 137 mEq/L (ref 135–145)
Total Protein: 6.7 g/dL (ref 6.0–8.3)

## 2011-10-27 LAB — URINALYSIS, ROUTINE W REFLEX MICROSCOPIC
Bilirubin Urine: NEGATIVE
Glucose, UA: NEGATIVE mg/dL
Hgb urine dipstick: NEGATIVE
Specific Gravity, Urine: 1.012 (ref 1.005–1.030)
pH: 6 (ref 5.0–8.0)

## 2011-10-27 LAB — CBC
HCT: 36.1 % — ABNORMAL LOW (ref 39.0–52.0)
Hemoglobin: 12.5 g/dL — ABNORMAL LOW (ref 13.0–17.0)
MCHC: 34.6 g/dL (ref 30.0–36.0)
MCV: 93.3 fL (ref 78.0–100.0)

## 2011-10-27 LAB — DIFFERENTIAL
Basophils Absolute: 0 10*3/uL (ref 0.0–0.1)
Eosinophils Absolute: 0.2 10*3/uL (ref 0.0–0.7)
Lymphs Abs: 1.7 10*3/uL (ref 0.7–4.0)
Neutrophils Relative %: 49 % (ref 43–77)

## 2011-10-27 LAB — URINE MICROSCOPIC-ADD ON

## 2011-10-27 NOTE — ED Notes (Signed)
Pt stated having ABD pain and diarrhea since yesterday. ABD tender to touch, ABD distended with post BS all 4Q.

## 2011-10-27 NOTE — ED Notes (Signed)
The pt has some abd cramping and diarrhea since yesterday.

## 2011-10-27 NOTE — ED Notes (Signed)
Dc instructions given and explained to pt, stated understanding, grand daughter will pick pt up.

## 2011-10-27 NOTE — ED Provider Notes (Signed)
History     CSN: [id number]  Arrival date & time 10/27/11  0059   First MD Initiated Contact with Patient 10/27/11 0109      Chief Complaint  Patient presents with  . Abdominal Pain    (Consider location/radiation/quality/duration/timing/severity/associated sxs/prior treatment) HPI Pt recently started on abx for diverticulits and began having diarrhea and abd cramping yesterday. Pt states he has diffuse baseline pain that is unchanged. No fever chills, vomiting, nausea. No blood in stool.  Past Medical History  Diagnosis Date  . Pneumonia   . Lymphoma     s/p partial gastrectomy  . Hypertension   . Hypercholesteremia   . Diabetes mellitus   . Gout   . Irregular heart beat   . Kidney stone     Past Surgical History  Procedure Date  . Partial gastrectomy 1994    for lymphoma  . Hernia repair   . Joint replacement   . Back surgery     History reviewed. No pertinent family history.  History  Substance Use Topics  . Smoking status: Never Smoker   . Smokeless tobacco: Never Used  . Alcohol Use: No      Review of Systems  Constitutional: Negative for fever and chills.  Respiratory: Negative for cough and shortness of breath.   Gastrointestinal: Positive for diarrhea. Negative for nausea, vomiting, blood in stool and abdominal distention. Abdominal pain: Pain improved for initial diagnosis of diverticulitis.  Neurological: Negative for weakness and numbness.    Allergies  Review of patient's allergies indicates no known allergies.  Home Medications   Current Outpatient Rx  Name Route Sig Dispense Refill  . ACETAMINOPHEN 325 MG PO TABS Oral Take 325 mg by mouth every 6 (six) hours as needed. For pain    . ALLOPURINOL 100 MG PO TABS Oral Take 100 mg by mouth daily.      . ASPIRIN 81 MG PO TABS Oral Take 81 mg by mouth daily.      Marland Kitchen BISACODYL 5 MG PO TBEC Oral Take 1 tablet (5 mg total) by mouth 2 (two) times daily. 14 tablet 0  . BISACODYL 5 MG PO TBEC Oral  Take 1 tablet (5 mg total) by mouth 2 (two) times daily. 14 tablet 0  . CARVEDILOL 12.5 MG PO TABS Oral Take 12.5 mg by mouth 2 (two) times daily with a meal.     . CYANOCOBALAMIN 100 MCG PO TABS Oral Take 100 mcg by mouth daily.      . CYCLOBENZAPRINE HCL 10 MG PO TABS Oral Take 10 mg by mouth 3 (three) times daily as needed. Muscle spasm    . ERGOCALCIFEROL 50000 UNITS PO CAPS Oral Take 50,000 Units by mouth every 14 (fourteen) days. Usually on Monday      . FERROUS SULFATE 325 (65 FE) MG PO TABS Oral Take 325 mg by mouth daily with breakfast.      . FUROSEMIDE 20 MG PO TABS Oral Take 20 mg by mouth daily.      Marland Kitchen GLIPIZIDE 10 MG PO TABS Oral Take 10 mg by mouth daily.      Marland Kitchen HYDROCODONE-ACETAMINOPHEN 5-325 MG PO TABS Oral Take 1 tablet by mouth every 6 (six) hours as needed for pain. 20 tablet 0  . HYDROCORTISONE ACETATE 25 MG RE SUPP Rectal Place 25 mg rectally 2 (two) times daily as needed. For hemorrhoids    . LISINOPRIL 20 MG PO TABS Oral Take 20 mg by mouth daily.      Marland Kitchen  METHYLCELLULOSE 1 % OP SOLN Both Eyes Place 1 drop into both eyes daily.      Marland Kitchen NIACIN 500 MG PO TABS Oral Take 500 mg by mouth 2 (two) times daily with a meal.     . PANTOPRAZOLE SODIUM 40 MG PO TBEC Oral Take 40 mg by mouth daily.      Marland Kitchen PIOGLITAZONE HCL 30 MG PO TABS Oral Take 30 mg by mouth daily.      Marland Kitchen POLYETHYLENE GLYCOL 3350 PO PACK Oral Take 17 g by mouth daily.      Marland Kitchen RANITIDINE HCL 150 MG PO CAPS Oral Take 150 mg by mouth 2 (two) times daily.      . SENNOSIDES 8.6 MG PO TABS Oral Take 2 tablets by mouth 2 (two) times daily.      . SERTRALINE HCL 100 MG PO TABS Oral Take 200 mg by mouth daily.      Marland Kitchen SIMVASTATIN 10 MG PO TABS Oral Take 10 mg by mouth at bedtime.      . ACYCLOVIR 5 % EX OINT Topical Apply 1 application topically 3 (three) times daily. For 5 days started 10/15/2011    . CIPROFLOXACIN HCL 500 MG PO TABS Oral Take 1 tablet (500 mg total) by mouth 2 (two) times daily. 20 tablet 0  . METRONIDAZOLE  500 MG PO TABS Oral Take 1 tablet (500 mg total) by mouth 3 (three) times daily. 30 tablet 0    BP 143/76  Pulse 84  Temp(Src) 98.2 F (36.8 C) (Oral)  Resp 10  SpO2 97%  Physical Exam  Nursing note and vitals reviewed. Constitutional: He is oriented to person, place, and time. He appears well-developed and well-nourished. No distress.  HENT:  Head: Normocephalic and atraumatic.  Mouth/Throat: Oropharynx is clear and moist.  Eyes: EOM are normal. Pupils are equal, round, and reactive to light.  Neck: Normal range of motion. Neck supple.  Cardiovascular: Normal rate and regular rhythm.   Pulmonary/Chest: Effort normal and breath sounds normal. No respiratory distress. He has no wheezes. He has no rales.  Abdominal: Soft. Bowel sounds are normal. There is tenderness (diffusely). There is no rebound and no guarding.  Musculoskeletal: Normal range of motion. He exhibits edema (1+ edema bl LE). He exhibits no tenderness.  Neurological: He is alert and oriented to person, place, and time.  Skin: Skin is warm and dry. No rash noted. No erythema.  Psychiatric: He has a normal mood and affect. His behavior is normal.    ED Course  Procedures (including critical care time)  Labs Reviewed  CBC - Abnormal; Notable for the following:    RBC 3.87 (*)    Hemoglobin 12.5 (*)    HCT 36.1 (*)    All other components within normal limits  DIFFERENTIAL - Abnormal; Notable for the following:    Monocytes Relative 13 (*)    All other components within normal limits  COMPREHENSIVE METABOLIC PANEL - Abnormal; Notable for the following:    BUN 25 (*)    Creatinine, Ser 1.77 (*)    Albumin 3.4 (*)    Alkaline Phosphatase 141 (*)    GFR calc non Af Amer 35 (*)    GFR calc Af Amer 41 (*)    All other components within normal limits  URINALYSIS, ROUTINE W REFLEX MICROSCOPIC - Abnormal; Notable for the following:    Protein, ur 100 (*)    All other components within normal limits  LIPASE, BLOOD  URINE MICROSCOPIC-ADD ON  CLOSTRIDIUM DIFFICILE BY PCR   Ct Abdomen Pelvis Wo Contrast  10/27/2011  *RADIOLOGY REPORT*  Clinical Data: Abdominal pain and diarrhea; abdomen tender to touch.  Abdominal distension.  CT ABDOMEN AND PELVIS WITHOUT CONTRAST  Technique:  Multidetector CT imaging of the abdomen and pelvis was performed following the standard protocol without intravenous contrast.  Comparison: CT of the abdomen and pelvis performed 06/11/2010  Findings: Minimal bibasilar atelectasis is again noted.  Diffuse coronary artery calcifications are seen.  A tiny hiatal hernia is again seen.  A tiny calcified granuloma is noted within the right hepatic lobe; the liver is otherwise unremarkable in appearance.  The spleen is within normal limits.  The pancreas demonstrates mild fatty infiltration, as on the prior study.  Minimal calcification is again noted at the right adrenal gland; the adrenal glands are otherwise unremarkable.  The contracted appearance of the gallbladder is stable in appearance; mild apparent gallbladder wall thickening is unchanged, and likely reflects the patient's baseline.  Numerous large bilateral renal cysts are again noted, with surrounding perinephric stranding, stable from 2011.  There is no evidence of hydronephrosis.  There is mild asymmetric prominence of the left renal pelvis, with mild apparent wall thickening; this also appears stable from 2011.  No renal or ureteral stones are seen.  No suspicious masses are identified.  No free fluid is seen.  A small focal Richter hernia is again noted along the right mid abdomen, likely involving the mid ileum.  There is no evidence of obstruction; the visualized small bowel is otherwise unremarkable.  The patient is status post partial gastrectomy, with gastrojejunostomy.  The anastomosis is unremarkable in appearance. No acute vascular abnormalities are identified.  Mild scattered calcification is again seen along the abdominal aorta  and its branches.  The appendix remains normal in caliber and contains minimal high- density material, without evidence for appendicitis.  Scattered diverticulosis is again noted along the entirety of the colon. Previously suggested minimal diverticulitis along the descending colon has improved.  There is no definite evidence of diverticulitis.  No abscess is identified.  There is no evidence for perforation.  The bladder remains mildly distended and is grossly unremarkable in appearance.  The prostate is normal in size.  Inguinal nodes are borderline normal in size, without significant inguinal lymphadenopathy.  The previously noted node adjacent to the small Richter hernia is also less apparent on the current study.  The anterior abdominal wall mesh is noted; there is no evidence of recurrent hernia at this location.  No acute osseous abnormalities are seen.  Facet hypertrophy, and prominent anterior and lateral osteophytes, are again noted along the lumbar spine.  IMPRESSION:  1.  Previously suggested minimal diverticulitis along the descending colon has improved; no definite evidence of diverticulitis at this time.  No evidence of perforation or abscess formation. 2.  Previously noted mild inguinal lymphadenopathy has resolved. 3.  Previously noted node adjacent to the small Richter hernia is now less apparent; the small Richter hernia is stable from multiple prior studies. 4.  Otherwise no significant change from the recent prior study.  Original Report Authenticated By: Tonia Ghent, M.D.     1. Diarrhea       MDM  PCP f/u. Return for concerns        Loren Racer, MD 10/27/11 [phone]

## 2011-11-22 ENCOUNTER — Emergency Department (HOSPITAL_COMMUNITY): Payer: Medicare Other

## 2011-11-22 ENCOUNTER — Encounter (HOSPITAL_COMMUNITY): Payer: Self-pay | Admitting: *Deleted

## 2011-11-22 ENCOUNTER — Emergency Department (HOSPITAL_COMMUNITY)
Admission: EM | Admit: 2011-11-22 | Discharge: 2011-11-22 | Disposition: A | Discharge status: Home / Self Care | Payer: Medicare Other | Attending: Emergency Medicine | Admitting: Emergency Medicine

## 2011-11-22 DIAGNOSIS — Z7982 Long term (current) use of aspirin: Secondary | ICD-10-CM | POA: Insufficient documentation

## 2011-11-22 DIAGNOSIS — W19XXXA Unspecified fall, initial encounter: Secondary | ICD-10-CM

## 2011-11-22 DIAGNOSIS — S0093XA Contusion of unspecified part of head, initial encounter: Secondary | ICD-10-CM

## 2011-11-22 DIAGNOSIS — E7800 Pure hypercholesterolemia, unspecified: Secondary | ICD-10-CM | POA: Insufficient documentation

## 2011-11-22 DIAGNOSIS — M549 Dorsalgia, unspecified: Secondary | ICD-10-CM | POA: Insufficient documentation

## 2011-11-22 DIAGNOSIS — I10 Essential (primary) hypertension: Secondary | ICD-10-CM | POA: Insufficient documentation

## 2011-11-22 DIAGNOSIS — S0990XA Unspecified injury of head, initial encounter: Secondary | ICD-10-CM | POA: Insufficient documentation

## 2011-11-22 DIAGNOSIS — Z862 Personal history of diseases of the blood and blood-forming organs and certain disorders involving the immune mechanism: Secondary | ICD-10-CM | POA: Insufficient documentation

## 2011-11-22 DIAGNOSIS — S1093XA Contusion of unspecified part of neck, initial encounter: Secondary | ICD-10-CM | POA: Insufficient documentation

## 2011-11-22 DIAGNOSIS — Z8639 Personal history of other endocrine, nutritional and metabolic disease: Secondary | ICD-10-CM | POA: Insufficient documentation

## 2011-11-22 DIAGNOSIS — S0003XA Contusion of scalp, initial encounter: Secondary | ICD-10-CM | POA: Insufficient documentation

## 2011-11-22 DIAGNOSIS — Z79899 Other long term (current) drug therapy: Secondary | ICD-10-CM | POA: Insufficient documentation

## 2011-11-22 DIAGNOSIS — R42 Dizziness and giddiness: Secondary | ICD-10-CM | POA: Insufficient documentation

## 2011-11-22 DIAGNOSIS — R51 Headache: Secondary | ICD-10-CM | POA: Insufficient documentation

## 2011-11-22 DIAGNOSIS — E119 Type 2 diabetes mellitus without complications: Secondary | ICD-10-CM | POA: Insufficient documentation

## 2011-11-22 DIAGNOSIS — W010XXA Fall on same level from slipping, tripping and stumbling without subsequent striking against object, initial encounter: Secondary | ICD-10-CM | POA: Insufficient documentation

## 2011-11-22 MED ORDER — HYDROCODONE-ACETAMINOPHEN 5-325 MG PO TABS
2.0000 | ORAL_TABLET | Freq: Once | ORAL | Status: AC
  Administered 2011-11-22: 2 via ORAL
  Filled 2011-11-22: qty 2

## 2011-11-22 MED ORDER — METHOCARBAMOL 500 MG PO TABS
500.0000 mg | ORAL_TABLET | Freq: Once | ORAL | Status: AC
  Administered 2011-11-22: 500 mg via ORAL
  Filled 2011-11-22: qty 1

## 2011-11-22 MED ORDER — CYCLOBENZAPRINE HCL 10 MG PO TABS: 10.0000 mg | ORAL_TABLET | Freq: Three times a day (TID) | ORAL | Status: AC | PRN

## 2011-11-22 MED ORDER — HYDROCODONE-ACETAMINOPHEN 5-325 MG PO TABS: 2.0000 | ORAL_TABLET | Freq: Four times a day (QID) | ORAL | Status: AC | PRN

## 2011-11-22 NOTE — Discharge Instructions (Signed)
Ice packs to the injured areas. Take the medications for pain. Return to the emergency department for any problems on the head injury sheet. Have Dr. Petra Kuba recheck if you aren't improving in the next week.

## 2011-11-22 NOTE — ED Notes (Addendum)
To ed for eval after falling backward off a wall and hit head on a pole. No LOC, but c/o dizziness. AAOx4. C/O nausea, no vomiting.

## 2011-11-22 NOTE — ED Notes (Signed)
Patient transported to CT 

## 2011-11-22 NOTE — ED Provider Notes (Signed)
History     CSN: [id number]  Arrival date & time 11/22/11  1850   First MD Initiated Contact with Patient 11/22/11 1948      Chief Complaint  Patient presents with  . Head Injury    (Consider location/radiation/quality/duration/timing/severity/associated sxs/prior treatment) HPI  Patient relates about 5 PM he was coming out of the drugstore and he thought he saw something under his car he turned to take a look and became off balance and fell back against a pole. He relates the back of his head hit the pole and then he slid down the pole on to the ground. He states his whole spine hurts in the back of his head hurts. He denies any loss of consciousness. He denies nausea, vomiting, blurred vision, dizziness, numbness or tingling in his arms or legs but he does feel little lightheaded. He relates his granddaughter brought into the emergency department.  PCP Dr Petra Kuba [facility]  Past Medical History  Diagnosis Date  . Pneumonia   . Lymphoma     s/p partial gastrectomy  . Hypertension   . Hypercholesteremia   . Diabetes mellitus   . Gout   . Irregular heart beat   . Kidney stone     Past Surgical History  Procedure Date  . Partial gastrectomy 1994    for lymphoma  . Hernia repair   . Joint replacement   . Back surgery     History reviewed. No pertinent family history.  History  Substance Use Topics  . Smoking status: Never Smoker   . Smokeless tobacco: Never Used  . Alcohol Use: No  lives alone, GD lives next door widowed    Review of Systems  All other systems reviewed and are negative.    Allergies  Review of patient's allergies indicates no known allergies.  Home Medications   Current Outpatient Rx  Name Route Sig Dispense Refill  . ACETAMINOPHEN 325 MG PO TABS Oral Take 325 mg by mouth every 6 (six) hours as needed. For pain    . ALLOPURINOL 100 MG PO TABS Oral Take 100 mg by mouth daily.      . ASPIRIN 81 MG PO TABS Oral Take 81 mg by mouth daily.       Marland Kitchen BISACODYL 5 MG PO TBEC Oral Take 5 mg by mouth 2 (two) times daily.    Marland Kitchen CARVEDILOL 12.5 MG PO TABS Oral Take 12.5 mg by mouth 2 (two) times daily with a meal.     . CYANOCOBALAMIN 100 MCG PO TABS Oral Take 100 mcg by mouth daily.      . CYCLOBENZAPRINE HCL 10 MG PO TABS Oral Take 10 mg by mouth 3 (three) times daily as needed. Muscle spasm    . ERGOCALCIFEROL 50000 UNITS PO CAPS Oral Take 50,000 Units by mouth every 14 (fourteen) days. Usually on Monday      . FERROUS SULFATE 325 (65 FE) MG PO TABS Oral Take 325 mg by mouth daily with breakfast.      . FUROSEMIDE 20 MG PO TABS Oral Take 20 mg by mouth daily.      Marland Kitchen GLIPIZIDE 10 MG PO TABS Oral Take 10 mg by mouth daily.      Marland Kitchen LISINOPRIL 20 MG PO TABS Oral Take 20 mg by mouth daily.      . METHYLCELLULOSE 1 % OP SOLN Both Eyes Place 1 drop into both eyes daily.      Marland Kitchen NIACIN 500 MG PO TABS Oral Take  500 mg by mouth 2 (two) times daily with a meal.     . PANTOPRAZOLE SODIUM 40 MG PO TBEC Oral Take 40 mg by mouth daily.      Marland Kitchen PIOGLITAZONE HCL 30 MG PO TABS Oral Take 30 mg by mouth daily.      Marland Kitchen POLYETHYLENE GLYCOL 3350 PO PACK Oral Take 17 g by mouth daily.      Marland Kitchen RANITIDINE HCL 150 MG PO CAPS Oral Take 150 mg by mouth 2 (two) times daily.      . SENNOSIDES 8.6 MG PO TABS Oral Take 2 tablets by mouth 2 (two) times daily.      . SERTRALINE HCL 100 MG PO TABS Oral Take 200 mg by mouth daily.      Marland Kitchen SIMVASTATIN 10 MG PO TABS Oral Take 10 mg by mouth at bedtime.        BP 138/82  Pulse 69  Temp(Src) 98.6 F (37 C) (Oral)  Resp 18  SpO2 99%  Vital signs normal    Physical Exam  Constitutional: He is oriented to person, place, and time. He appears well-developed and well-nourished.  Non-toxic appearance. He does not appear ill. No distress.  HENT:  Head: Normocephalic and atraumatic.  Right Ear: External ear normal.  Left Ear: External ear normal.  Nose: Nose normal. No mucosal edema or rhinorrhea.  Mouth/Throat: Oropharynx is  clear and moist and mucous membranes are normal. No dental abscesses or uvula swelling.       There's no abrasions, swelling, or lacerations to the back of his head where he states he was hit.  Eyes: Conjunctivae and EOM are normal. Pupils are equal, round, and reactive to light.  Neck: Full passive range of motion without pain.       Patient has a c-collar in place  Cardiovascular: Normal rate, regular rhythm and normal heart sounds.  Exam reveals no gallop and no friction rub.   No murmur heard. Pulmonary/Chest: Effort normal and breath sounds normal. No respiratory distress. He has no wheezes. He has no rhonchi. He has no rales. He exhibits no tenderness and no crepitus.  Abdominal: Soft. Normal appearance and bowel sounds are normal. He exhibits no distension. There is no tenderness. There is no rebound and no guarding.  Musculoskeletal: Normal range of motion. He exhibits no edema and no tenderness.       Moves all extremities well. Patient was unable to sit up for me to examine him but when the nurse asked him to sit up the take off for sure if he sat up he's tender diffusely of his whole back.  Neurological: He is alert and oriented to person, place, and time. He has normal strength. No cranial nerve deficit.  Skin: Skin is warm, dry and intact. No rash noted. No erythema. No pallor.  Psychiatric: He has a normal mood and affect. His speech is normal and behavior is normal. His mood appears not anxious.       Patient very excited he keeps repeating what happened over and over again    ED Course  Procedures (including critical care time)   Medications  cyclobenzaprine (FLEXERIL) 10 MG tablet (not administered)  HYDROcodone-acetaminophen (NORCO) 5-325 MG per tablet (not administered)  methocarbamol (ROBAXIN) tablet 500 mg (500 mg Oral Given 11/22/11 2027)  HYDROcodone-acetaminophen (NORCO) 5-325 MG per tablet 2 tablet (2 tablet Oral Given 11/22/11 2027)   Patient requesting more muscle  relaxer "put me to sleep tonight"    Dg  Chest 2 View  11/22/2011  *RADIOLOGY REPORT*  Clinical Data: Cough.  Lymphoma.  Gout.  CHEST - 2 VIEW  Comparison: 08/22/2011  Findings: Heart size is stable and at the upper limits of normal in size.  Both lungs are clear.  No evidence of pleural effusion.  No mass or lymphadenopathy identified.  Right shoulder prosthesis again noted.  IMPRESSION: Stable exam.  No active cardiopulmonary disease.  Original Report Authenticated By: Danae Orleans, M.D.   Dg Thoracic Spine W/swimmers  11/22/2011  *RADIOLOGY REPORT*  Clinical Data: Fall.  Thoracic back pain.  THORACIC SPINE - 2 VIEW + SWIMMERS  Comparison: None.  Findings: No evidence of acute fracture or subluxation.  Diffuse idiopathic skeletal hyperostosis noted.  Intervertebral disc spaces are maintained.  No focal lytic or sclerotic bone lesions are identified.  IMPRESSION:  1.  No acute findings. 2.  Diffuse idiopathic skeletal hyperostosis incidentally noted.  Original Report Authenticated By: Danae Orleans, M.D.   Dg Lumbar Spine Complete  11/22/2011  *RADIOLOGY REPORT*  Clinical Data: Fall.  Low back injury and pain.  LUMBAR SPINE - COMPLETE 4+ VIEW  Comparison: None.  Findings: No evidence of acute fracture, spondylolysis, or spondylolisthesis.  Diffuse idiopathic skeletal hyperostosis is noted.  Intervertebral disc spaces are maintained. No other significant bone abnormality identified.  IMPRESSION:  1.  No acute findings. 2.  Diffuse idiopathic skeletal hyperostosis incidentally noted.  Original Report Authenticated By: Danae Orleans, M.D.   Ct Head Wo Contrast  11/22/2011  *RADIOLOGY REPORT*  Clinical Data:  Headache, head, neck and back pain  CT HEAD WITHOUT CONTRAST CT CERVICAL SPINE WITHOUT CONTRAST  Technique:  Multidetector CT imaging of the head and cervical spine was performed following the standard protocol without intravenous contrast.  Multiplanar CT image reconstructions of the cervical spine  were also generated.  Comparison:  10/31/2010  CT HEAD  Findings: Generalized atrophy. Normal ventricular morphology. No midline shift or mass effect. Small vessel chronic ischemic changes of deep cerebral white matter. No intracranial hemorrhage, mass lesion, or acute infarction. Visualized paranasal sinuses and mastoid air cells clear. Bones unremarkable.  IMPRESSION: Atrophy with minimal small vessel chronic ischemic changes of deep cerebral white matter. No acute intracranial abnormalities.  CT CERVICAL SPINE  Findings: Visualized skull base intact. Mild osseous demineralization. Lung apices clear. Large bridging anterior osteophytes with fusion from the inferior C2 through C6. Calcification of the anterior longitudinal ligament. Additional endplate spur formation seen at C6-C7 and 10/1969. Mild encroachment upon left C4-C5 and right C3-C4 neural foramina by uncovertebral spurs. Vertebral body heights maintained without fracture or subluxation. Prevertebral soft tissues normal thickness.  IMPRESSION: Multilevel degenerative disc disease changes of the cervical spine with bridging anterior osteophytes, calcification of the anterior longitudinal ligament, and ankylosis from C2-C6. No acute cervical spine abnormalities.  Original Report Authenticated By: Lollie Marrow, M.D.     1. Fall   2. Contusion of head   3. Back pain    New Prescriptions   CYCLOBENZAPRINE (FLEXERIL) 10 MG TABLET    Take 1 tablet (10 mg total) by mouth 3 (three) times daily as needed for muscle spasms.   HYDROCODONE-ACETAMINOPHEN (NORCO) 5-325 MG PER TABLET    Take 2 tablets by mouth every 6 (six) hours as needed for pain.   Plan discharge    MDM          Ward Givens, MD 11/22/11 2220

## 2011-12-08 ENCOUNTER — Emergency Department (HOSPITAL_COMMUNITY)
Admission: EM | Admit: 2011-12-08 | Discharge: 2011-12-09 | Disposition: A | Discharge status: Home / Self Care | Payer: Medicare Other | Attending: Emergency Medicine | Admitting: Emergency Medicine

## 2011-12-08 ENCOUNTER — Emergency Department (HOSPITAL_COMMUNITY): Payer: Medicare Other

## 2011-12-08 ENCOUNTER — Encounter (HOSPITAL_COMMUNITY): Payer: Self-pay | Admitting: *Deleted

## 2011-12-08 DIAGNOSIS — R29898 Other symptoms and signs involving the musculoskeletal system: Secondary | ICD-10-CM | POA: Insufficient documentation

## 2011-12-08 DIAGNOSIS — E119 Type 2 diabetes mellitus without complications: Secondary | ICD-10-CM | POA: Insufficient documentation

## 2011-12-08 DIAGNOSIS — Z8639 Personal history of other endocrine, nutritional and metabolic disease: Secondary | ICD-10-CM | POA: Insufficient documentation

## 2011-12-08 DIAGNOSIS — I10 Essential (primary) hypertension: Secondary | ICD-10-CM | POA: Insufficient documentation

## 2011-12-08 DIAGNOSIS — Z79899 Other long term (current) drug therapy: Secondary | ICD-10-CM | POA: Insufficient documentation

## 2011-12-08 DIAGNOSIS — R209 Unspecified disturbances of skin sensation: Secondary | ICD-10-CM | POA: Insufficient documentation

## 2011-12-08 DIAGNOSIS — G8929 Other chronic pain: Secondary | ICD-10-CM

## 2011-12-08 DIAGNOSIS — Z862 Personal history of diseases of the blood and blood-forming organs and certain disorders involving the immune mechanism: Secondary | ICD-10-CM | POA: Insufficient documentation

## 2011-12-08 DIAGNOSIS — Z7982 Long term (current) use of aspirin: Secondary | ICD-10-CM | POA: Insufficient documentation

## 2011-12-08 DIAGNOSIS — E7800 Pure hypercholesterolemia, unspecified: Secondary | ICD-10-CM | POA: Insufficient documentation

## 2011-12-08 LAB — CBC
Platelets: 190 10*3/uL (ref 150–400)
RBC: 3.86 MIL/uL — ABNORMAL LOW (ref 4.22–5.81)
WBC: 5.6 10*3/uL (ref 4.0–10.5)

## 2011-12-08 LAB — DIFFERENTIAL
Eosinophils Absolute: 0.2 10*3/uL (ref 0.0–0.7)
Lymphocytes Relative: 33 % (ref 12–46)
Lymphs Abs: 1.9 10*3/uL (ref 0.7–4.0)
Neutrophils Relative %: 53 % (ref 43–77)

## 2011-12-08 LAB — GLUCOSE, CAPILLARY: Glucose-Capillary: 141 mg/dL — ABNORMAL HIGH (ref 70–99)

## 2011-12-08 LAB — BASIC METABOLIC PANEL
Calcium: 9.1 mg/dL (ref 8.4–10.5)
GFR calc non Af Amer: 41 mL/min — ABNORMAL LOW (ref >90)
Sodium: 137 mEq/L (ref 135–145)

## 2011-12-08 NOTE — ED Notes (Signed)
To CT via stretcher. No changes, alert, NAD, calm, interactive.

## 2011-12-08 NOTE — ED Notes (Signed)
C/o R arm numbness, onset 1700, onset while pushing a grocery cart, was using cart to balance himself, but lost balance, states, (in order), "had sharp pain on L side of head, lost balance and went to L, used cart to balance himself and felt R arm numbness, from hand to just above elbow", (difficult to obtain story), HA gone, numbness remains. Alert, NAD, calm interactive, skin W&D, resps e/u, speech clear with some dysarthria "at baseline" per family x2 at [facility] MAEx4, PERRL 3mm brisk. No h/o CVA. H/o swallowing difficulty & special diet. Swallow screen failed. H/o DM, last cbg PTA "87", "no ETOH use". H/o back/disc surgery wears RLE brace, also R knee slips.

## 2011-12-08 NOTE — ED Notes (Signed)
CBG checked by EMT R Jennette [id number]

## 2011-12-08 NOTE — ED Notes (Signed)
EDP notified of pt, HPI with timeframe, hx & sx.

## 2011-12-09 MED ORDER — OXYCODONE-ACETAMINOPHEN 5-325 MG PO TABS: 2.0000 | ORAL_TABLET | ORAL | Status: AC | PRN

## 2011-12-09 MED ORDER — OXYCODONE-ACETAMINOPHEN 5-325 MG PO TABS
1.0000 | ORAL_TABLET | Freq: Once | ORAL | Status: AC
  Administered 2011-12-09: 1 via ORAL
  Filled 2011-12-09: qty 1

## 2011-12-09 NOTE — Discharge Instructions (Signed)
Your CAT scan does not show signs of a stroke.  Your blood tests do not show any signs of severe illness.  Use Percocet for pain.  Follow up with your Dr. for reevaluation.  If your symptoms persist.  Return for worse or uncontrolled symptoms

## 2011-12-09 NOTE — ED Notes (Signed)
No changes, alert, NAD, calm, interactive, dressing self, to be d/c'd, initially failed swallow screen d/t hx, pain med given at d/c, swallowed w/o difficulty, called family for ride home, message left on voice mail.

## 2011-12-09 NOTE — ED Provider Notes (Signed)
History     CSN: [id number]  Arrival date & time 12/08/11  2039   First MD Initiated Contact with Patient 12/08/11 2145      Chief Complaint  Patient presents with  . Numbness    (Consider location/radiation/quality/duration/timing/severity/associated sxs/prior treatment) The history is provided by the patient and medical records.   the patient is a 78 year old, male, who presents emergency department complaining of right arm, numbness, after he stumbled at the [facility].  He did not fall to the ground.  He has chronic right-sided weakness in his arm after he surgery, as well as chronic right leg weakness.  He denies headache.  He denies nausea, vomiting, fevers, chills, cough, difficulty breathing, or any recent illnesses.  Past Medical History  Diagnosis Date  . Pneumonia   . Lymphoma     s/p partial gastrectomy  . Hypertension   . Hypercholesteremia   . Diabetes mellitus   . Gout   . Irregular heart beat   . Kidney stone     Past Surgical History  Procedure Date  . Partial gastrectomy 1994    for lymphoma  . Hernia repair   . Joint replacement   . Back surgery     No family history on file.  History  Substance Use Topics  . Smoking status: Never Smoker   . Smokeless tobacco: Never Used  . Alcohol Use: No      Review of Systems  Constitutional: Negative for fever and chills.  HENT: Negative for nosebleeds.   Respiratory: Negative for cough, chest tightness and shortness of breath.   Cardiovascular: Negative for chest pain.  Gastrointestinal: Negative for nausea, vomiting and abdominal pain.  Musculoskeletal: Negative for back pain.  Neurological: Positive for weakness and numbness. Negative for headaches.  Psychiatric/Behavioral: Negative for confusion.  All other systems reviewed and are negative.    Allergies  Review of patient's allergies indicates no known allergies.  Home Medications   Current Outpatient Rx  Name Route Sig Dispense  Refill  . ACETAMINOPHEN 325 MG PO TABS Oral Take 325 mg by mouth every 6 (six) hours as needed. For pain    . ALLOPURINOL 100 MG PO TABS Oral Take 100 mg by mouth daily.      . ASPIRIN 81 MG PO TABS Oral Take 81 mg by mouth daily.      Marland Kitchen CARVEDILOL 12.5 MG PO TABS Oral Take 12.5 mg by mouth 2 (two) times daily with a meal.     . CYANOCOBALAMIN 100 MCG PO TABS Oral Take 100 mcg by mouth daily.      . CYCLOBENZAPRINE HCL 10 MG PO TABS Oral Take 10 mg by mouth 3 (three) times daily as needed. Muscle spasm    . ERGOCALCIFEROL 50000 UNITS PO CAPS Oral Take 50,000 Units by mouth every 14 (fourteen) days. Usually on Monday      . FERROUS SULFATE 325 (65 FE) MG PO TABS Oral Take 325 mg by mouth daily with breakfast.      . FUROSEMIDE 20 MG PO TABS Oral Take 20 mg by mouth daily.      Marland Kitchen GLIPIZIDE 10 MG PO TABS Oral Take 10 mg by mouth daily.      Marland Kitchen LISINOPRIL 20 MG PO TABS Oral Take 20 mg by mouth daily.      . METHYLCELLULOSE 1 % OP SOLN Both Eyes Place 1 drop into both eyes daily.     Marland Kitchen NIACIN 500 MG PO TABS Oral Take 500 mg  by mouth 2 (two) times daily with a meal.     . PANTOPRAZOLE SODIUM 40 MG PO TBEC Oral Take 40 mg by mouth daily.      Marland Kitchen PIOGLITAZONE HCL 30 MG PO TABS Oral Take 30 mg by mouth daily.      Marland Kitchen POLYETHYLENE GLYCOL 3350 PO PACK Oral Take 17 g by mouth at bedtime.     Marland Kitchen RANITIDINE HCL 150 MG PO CAPS Oral Take 150 mg by mouth 2 (two) times daily.      . SENNOSIDES 8.6 MG PO TABS Oral Take 2 tablets by mouth 2 (two) times daily.     . SERTRALINE HCL 100 MG PO TABS Oral Take 200 mg by mouth daily.      Marland Kitchen SIMVASTATIN 10 MG PO TABS Oral Take 10 mg by mouth at bedtime.        BP 135/62  Pulse 64  Temp(Src) 97.8 F (36.6 C) (Oral)  Resp 16  SpO2 93%  Physical Exam  Vitals reviewed. Constitutional: He is oriented to person, place, and time. He appears well-developed and well-nourished.  HENT:  Head: Normocephalic and atraumatic.  Eyes: Conjunctivae are normal. Pupils are equal,  round, and reactive to light.  Neck: Normal range of motion. Neck supple.  Cardiovascular: Normal rate.   No murmur heard. Pulmonary/Chest: Effort normal. No respiratory distress.  Abdominal: Soft. There is no tenderness.  Musculoskeletal:       Right shoulder has an old scar on it.  He has decreased range of motion at the right shoulder, which he says is chronic.  He has normal strength at his elbow, and is his hand on the right side.  He has decreased strength on the right lower extremity, which he says is chronic, as well.  The left side is completely normal with 5 over 5 strength  Neurological: He is alert and oriented to person, place, and time.  Skin: Skin is warm and dry.  Psychiatric: He has a normal mood and affect. Thought content normal.    ED Course  Procedures (including critical care time)  Labs Reviewed  BASIC METABOLIC PANEL - Abnormal; Notable for the following:    Glucose, Bld 147 (*)    BUN 28 (*)    Creatinine, Ser 1.57 (*)    GFR calc non Af Amer 41 (*)    GFR calc Af Amer 47 (*)    All other components within normal limits  CBC - Abnormal; Notable for the following:    RBC 3.86 (*)    Hemoglobin 12.4 (*)    HCT 36.1 (*)    All other components within normal limits  GLUCOSE, CAPILLARY - Abnormal; Notable for the following:    Glucose-Capillary 141 (*)    All other components within normal limits  DIFFERENTIAL   Ct Head Wo Contrast  12/08/2011  *RADIOLOGY REPORT*  Clinical Data: Numbness right arm.  CT HEAD WITHOUT CONTRAST  Technique:  Contiguous axial images were obtained from the base of the skull through the vertex without contrast.  Comparison: 11/22/2011.  Findings: No intracranial hemorrhage.  Mild small vessel disease type changes without CT evidence of large acute infarct.  Mild atrophy without hydrocephalus.  No intracranial mass lesion detected on this unenhanced exam.  IMPRESSION: No intracranial hemorrhage or CT evidence of large acute infarct.   Original Report Authenticated By: Fuller Canada, M.D.     No diagnosis found.    MDM  Chronic pain, and right-sided weakness.  No  evidence of stroke or severe illness.        Cheri Guppy, MD 12/09/11 0010

## 2012-01-04 ENCOUNTER — Encounter (HOSPITAL_COMMUNITY): Payer: Self-pay | Admitting: Emergency Medicine

## 2012-01-04 ENCOUNTER — Emergency Department (HOSPITAL_COMMUNITY)
Admission: EM | Admit: 2012-01-04 | Discharge: 2012-01-04 | Disposition: A | Discharge status: Home / Self Care | Payer: Medicare Other | Attending: Emergency Medicine | Admitting: Emergency Medicine

## 2012-01-04 DIAGNOSIS — E119 Type 2 diabetes mellitus without complications: Secondary | ICD-10-CM | POA: Insufficient documentation

## 2012-01-04 DIAGNOSIS — Z87898 Personal history of other specified conditions: Secondary | ICD-10-CM | POA: Insufficient documentation

## 2012-01-04 DIAGNOSIS — R609 Edema, unspecified: Secondary | ICD-10-CM | POA: Insufficient documentation

## 2012-01-04 DIAGNOSIS — I10 Essential (primary) hypertension: Secondary | ICD-10-CM | POA: Insufficient documentation

## 2012-01-04 DIAGNOSIS — Z87442 Personal history of urinary calculi: Secondary | ICD-10-CM | POA: Insufficient documentation

## 2012-01-04 DIAGNOSIS — Z8701 Personal history of pneumonia (recurrent): Secondary | ICD-10-CM | POA: Insufficient documentation

## 2012-01-04 LAB — GLUCOSE, CAPILLARY: Glucose-Capillary: 84 mg/dL (ref 70–99)

## 2012-01-04 NOTE — ED Provider Notes (Signed)
History     CSN: [id number]  Arrival date & time 01/04/12  1210   First MD Initiated Contact with Patient 01/04/12 1222      Chief Complaint  Patient presents with  . Leg Swelling     The history is provided by the patient.  lower extremity swelling - onset - unknown time ago Course - stable Worsened by - nothing Improved by - nothing Denies cp/sob Denies new trauma No fever/chills/vomiting Pt reports he feels "shaky" due to low blood glucose  Pt reports more swelling his legs.  He reports it seems worse with some mild pain.  He is concerned for DVT No other new complaints He wears orthotic splint on right LE and uses walker at baseline   Past Medical History  Diagnosis Date  . Pneumonia   . Lymphoma     s/p partial gastrectomy  . Hypertension   . Hypercholesteremia   . Diabetes mellitus   . Gout   . Irregular heart beat   . Kidney stone     Past Surgical History  Procedure Date  . Partial gastrectomy 1994    for lymphoma  . Hernia repair   . Joint replacement   . Back surgery     History reviewed. No pertinent family history.  History  Substance Use Topics  . Smoking status: Never Smoker   . Smokeless tobacco: Never Used  . Alcohol Use: No      Review of Systems  Constitutional: Negative for fever.  Respiratory: Negative for shortness of breath.   All other systems reviewed and are negative.    Allergies  Review of patient's allergies indicates no known allergies.  Home Medications   Current Outpatient Rx  Name Route Sig Dispense Refill  . ACETAMINOPHEN 325 MG PO TABS Oral Take 325 mg by mouth every 6 (six) hours as needed. For pain    . ALLOPURINOL 100 MG PO TABS Oral Take 100 mg by mouth daily.      . ASPIRIN EC 81 MG PO TBEC Oral Take 81 mg by mouth daily.    Marland Kitchen CARVEDILOL 12.5 MG PO TABS Oral Take 12.5 mg by mouth 2 (two) times daily with a meal.     . VITAMIN B 12 PO Oral Take 1 tablet by mouth at bedtime.    . CYCLOBENZAPRINE HCL  10 MG PO TABS Oral Take 10 mg by mouth 3 (three) times daily as needed. Muscle spasm    . FUROSEMIDE 20 MG PO TABS Oral Take 20 mg by mouth daily.      Marland Kitchen GLIPIZIDE 10 MG PO TABS Oral Take 10 mg by mouth daily.      Marland Kitchen LISINOPRIL 20 MG PO TABS Oral Take 20 mg by mouth daily.      Marland Kitchen NIACIN 500 MG PO TABS Oral Take 500 mg by mouth at bedtime.     Marland Kitchen PANTOPRAZOLE SODIUM 40 MG PO TBEC Oral Take 40 mg by mouth daily.      Marland Kitchen PIOGLITAZONE HCL 30 MG PO TABS Oral Take 30 mg by mouth daily.      Marland Kitchen RANITIDINE HCL 150 MG PO CAPS Oral Take 150 mg by mouth 2 (two) times daily.      . SENNOSIDES 8.6 MG PO TABS Oral Take 2 tablets by mouth 2 (two) times daily.     . SERTRALINE HCL 100 MG PO TABS Oral Take 200 mg by mouth daily.      Marland Kitchen SIMVASTATIN 10 MG PO  TABS Oral Take 10 mg by mouth at bedtime.        BP 128/114  Pulse 65  Temp(Src) 97.9 F (36.6 C) (Oral)  Resp 16  SpO2 96%  Physical Exam CONSTITUTIONAL: Well developed/well nourished HEAD AND FACE: Normocephalic/atraumatic EYES: EOMI/PERRL ENMT: Mucous membranes moist NECK: supple no meningeal signs SPINE:entire spine nontender CV:  no murmurs/rubs/gallops noted LUNGS: Lungs are clear to auscultation bilaterally, no apparent distress ABDOMEN: soft, nontender, no rebound or guarding GU:no cva tenderness NEURO: Pt is awake/alert, no distress EXTREMITIES: pulses normal, full ROM.  Symmetric pitting edema noted to bilateral LE.  No erythema/streaking noted.  No significant calf tenderness noted.  No signs of trauma.  No signs of septic joint in either ankle/knee.  He has full ROM of each leg.  No open skin is noted to either foot SKIN: warm, color normal PSYCH: no abnormalities of mood noted  ED Course  Procedures   Labs Reviewed  GLUCOSE, CAPILLARY   1. Peripheral edema     Review of chart reveals that patient has had this edema previously, doubt DVT or acute CHF at this time (last echo normal) recent evaluation of his renal function is at  baseline, and did have slight hypoalbuminemia on previous labs  The patient appears reasonably screened and/or stabilized for discharge and I doubt any other medical condition or other [facility] requiring further screening, evaluation, or treatment in the ED at this time prior to discharge.   MDM  Nursing notes reviewed and considered in documentation All labs/vitals reviewed and considered Previous records reviewed and considered         Joya Gaskins, MD 01/04/12 1439

## 2012-01-04 NOTE — ED Notes (Signed)
NAD noted at time of d/c home. D/C inst given by MD and pt verbalized understanding. 

## 2012-01-04 NOTE — Discharge Instructions (Signed)
RETURN FOR WORSENED PAIN/SWELLING OR NEW CHEST PAIN OR SHORTNESS OF BREATH OVER THE NEXT 24 HOURS  Edema Edema is a buildup of fluids. It is most common in the feet, ankles, and legs. This happens more as a person ages. It may affect one or both legs. HOME CARE   Raise (elevate) the legs or ankles above the level of the heart while lying down.   Avoid sitting or standing still for a long time.   Exercise the legs to help the puffiness (swelling) go down.   A low-salt diet may help lessen the puffiness.   Only take medicine as told by your doctor.  GET HELP RIGHT AWAY IF:   You develop shortness of breath or chest pain.   You cannot breathe when you lie down.   You have more puffiness that does not go away with treatment.   You develop pain or redness in the areas that are puffy.   You have a temperature by mouth above 102 F (38.9 C), not controlled by medicine.   You gain 3 lb/1.4 kg or more in 1 day or 5 lb/2.3 kg in a week.  MAKE SURE YOU:   Understand these instructions.   Will watch your condition.   Will get help right away if you are not doing well or get worse.  Document Released: 02/04/2008 Document Revised: 08/07/2011 Document Reviewed: 02/04/2008 [facility] Patient Information 2012 [place], [place].

## 2012-01-04 NOTE — ED Notes (Signed)
Pt called for ride home. Waiting for ride to arrive.

## 2012-01-04 NOTE — ED Notes (Addendum)
Pt c/o left ankle pain and right knee pain. Pt states pain is "worse than usual". Pt has orthotic splint on RLE, usually uses a walker for ambulation. States BLE swelling is normal and goes down with his fluid pill and elevation. Pt has hx of gout

## 2012-01-04 NOTE — ED Notes (Signed)
Pt c/o bilateral leg swelling x several days more than normal; pt sts felt lightheaded today and had to "eat a cookie"; pt poor historian

## 2012-01-30 ENCOUNTER — Encounter (HOSPITAL_COMMUNITY): Payer: Self-pay

## 2012-01-30 ENCOUNTER — Emergency Department (HOSPITAL_COMMUNITY): Payer: Medicare Other

## 2012-01-30 ENCOUNTER — Emergency Department (HOSPITAL_COMMUNITY)
Admission: EM | Admit: 2012-01-30 | Discharge: 2012-01-30 | Disposition: A | Discharge status: Home / Self Care | Payer: Medicare Other | Attending: Emergency Medicine | Admitting: Emergency Medicine

## 2012-01-30 DIAGNOSIS — E119 Type 2 diabetes mellitus without complications: Secondary | ICD-10-CM | POA: Insufficient documentation

## 2012-01-30 DIAGNOSIS — M549 Dorsalgia, unspecified: Secondary | ICD-10-CM | POA: Insufficient documentation

## 2012-01-30 DIAGNOSIS — S0003XA Contusion of scalp, initial encounter: Secondary | ICD-10-CM | POA: Insufficient documentation

## 2012-01-30 DIAGNOSIS — I10 Essential (primary) hypertension: Secondary | ICD-10-CM | POA: Insufficient documentation

## 2012-01-30 DIAGNOSIS — S0083XA Contusion of other part of head, initial encounter: Secondary | ICD-10-CM | POA: Insufficient documentation

## 2012-01-30 DIAGNOSIS — IMO0002 Reserved for concepts with insufficient information to code with codable children: Secondary | ICD-10-CM | POA: Insufficient documentation

## 2012-01-30 DIAGNOSIS — W19XXXA Unspecified fall, initial encounter: Secondary | ICD-10-CM

## 2012-01-30 DIAGNOSIS — S0081XA Abrasion of other part of head, initial encounter: Secondary | ICD-10-CM

## 2012-01-30 DIAGNOSIS — G8929 Other chronic pain: Secondary | ICD-10-CM | POA: Insufficient documentation

## 2012-01-30 DIAGNOSIS — W010XXA Fall on same level from slipping, tripping and stumbling without subsequent striking against object, initial encounter: Secondary | ICD-10-CM | POA: Insufficient documentation

## 2012-01-30 NOTE — ED Provider Notes (Signed)
History     CSN: [id number]  Arrival date & time 01/30/12  1134   First MD Initiated Contact with Patient 01/30/12 1140      Chief Complaint  Patient presents with  . Head Injury    (Consider location/radiation/quality/duration/timing/severity/associated sxs/prior treatment) Patient is a 78 y.o. male presenting with head injury. The history is provided by the patient.  Head Injury  The incident occurred less than 1 hour ago. Pertinent negatives include no numbness and no weakness.   patient tripped and fell and hit his head on a concrete surface. No loss this. He does have swelling and bleeding of his left forehead. He states his pain in his head and pain in his back. He states he always back pain and gets worse whenever he falls. No numbness weakness. No loss of consciousness. He is on any blood thinners. No chest or abdominal pain. He thinks that his tetanus is up-to-date.  Past Medical History  Diagnosis Date  . Pneumonia   . Lymphoma     s/p partial gastrectomy  . Hypertension   . Hypercholesteremia   . Diabetes mellitus   . Gout   . Irregular heart beat   . Kidney stone     Past Surgical History  Procedure Date  . Partial gastrectomy 1994    for lymphoma  . Hernia repair   . Joint replacement   . Back surgery     History reviewed. No pertinent family history.  History  Substance Use Topics  . Smoking status: Never Smoker   . Smokeless tobacco: Never Used  . Alcohol Use: No      Review of Systems  Constitutional: Negative for activity change and appetite change.  HENT: Negative for neck stiffness.   Eyes: Negative for pain.  Respiratory: Negative for chest tightness and shortness of breath.   Cardiovascular: Negative for chest pain and leg swelling.  Gastrointestinal: Negative for nausea and abdominal pain.  Genitourinary: Negative for flank pain.  Musculoskeletal: Positive for back pain.  Skin: Positive for wound. Negative for rash.  Neurological:  Negative for weakness, numbness and headaches.  Psychiatric/Behavioral: Negative for behavioral problems.    Allergies  Review of patient's allergies indicates no known allergies.  Home Medications   Current Outpatient Rx  Name Route Sig Dispense Refill  . ACETAMINOPHEN 325 MG PO TABS Oral Take 325 mg by mouth every 6 (six) hours as needed. For pain    . ALLOPURINOL 100 MG PO TABS Oral Take 100 mg by mouth daily.      . ASPIRIN EC 81 MG PO TBEC Oral Take 81 mg by mouth daily.    Marland Kitchen CARVEDILOL 12.5 MG PO TABS Oral Take 12.5 mg by mouth 2 (two) times daily with a meal.     . VITAMIN B 12 PO Oral Take 1 tablet by mouth every morning.     . CYCLOBENZAPRINE HCL 10 MG PO TABS Oral Take 10 mg by mouth 3 (three) times daily as needed. Muscle spasm    . FUROSEMIDE 20 MG PO TABS Oral Take 20 mg by mouth daily as needed. Water gain    . GLIPIZIDE 10 MG PO TABS Oral Take 10 mg by mouth daily.      Marland Kitchen LISINOPRIL 20 MG PO TABS Oral Take 20 mg by mouth daily.      Marland Kitchen NIACIN 500 MG PO TABS Oral Take 500 mg by mouth at bedtime.     Marland Kitchen PANTOPRAZOLE SODIUM 40 MG PO TBEC Oral  Take 40 mg by mouth daily.      Marland Kitchen PIOGLITAZONE HCL 30 MG PO TABS Oral Take 15 mg by mouth daily.     Marland Kitchen RANITIDINE HCL 150 MG PO CAPS Oral Take 150 mg by mouth 2 (two) times daily.      . SENNOSIDES 8.6 MG PO TABS Oral Take 2 tablets by mouth 2 (two) times daily.     . SERTRALINE HCL 100 MG PO TABS Oral Take 200 mg by mouth daily.      Marland Kitchen SIMVASTATIN 10 MG PO TABS Oral Take 10 mg by mouth at bedtime.        BP 156/88  Pulse 68  Temp(Src) 98 F (36.7 C) (Oral)  Resp 18  SpO2 100%  Physical Exam  Constitutional: He appears well-developed.  HENT:       Moderate size hematoma in the left forehead above left eyebrow. There is abrasions of skin and a deeper superficial laceration inferiorly.  Eyes: Pupils are equal, round, and reactive to light.  Neck: Normal range of motion. Neck supple.  Cardiovascular: Normal rate and regular  rhythm.   Pulmonary/Chest: Effort normal. He exhibits no tenderness.  Abdominal: Soft. He exhibits no distension. There is no tenderness.  Musculoskeletal: Normal range of motion.       Mild inferior lumbar tenderness no crepitance or deformity  Skin: Skin is warm.    ED Course  Procedures (including critical care time)  Labs Reviewed - No data to display Ct Head Wo Contrast  01/30/2012  *RADIOLOGY REPORT*  Clinical Data: Larey Seat and hit head  CT HEAD WITHOUT CONTRAST  Technique:  Contiguous axial images were obtained from the base of the skull through the vertex without contrast.  Comparison: CT 12/08/2011  Findings: Generalized atrophy.  No acute infarct, mass, or hemorrhage.  Left frontal scalp hematoma.  Negative for skull fracture  IMPRESSION: No acute intracranial abnormality.  Original Report Authenticated By: Camelia Phenes, M.D.     1. Fall   2. Traumatic hematoma of forehead   3. Facial abrasion   4. Chronic back pain       MDM  Patient with mechanical fall. Hematoma forehead without a suturable laceration. CT was negative for fracture or bleed. He'll be discharged home. I doubt spinal injury. C-spine cleared by next period. He has chronic back pain and will followup with Dr.        Juliet Rude. Rubin Payor, MD 01/30/12 [id number]

## 2012-01-30 NOTE — ED Notes (Signed)
Patient transported to CT 

## 2012-01-30 NOTE — ED Notes (Signed)
Returns from CT 

## 2012-01-30 NOTE — Discharge Instructions (Signed)
Abrasions Abrasions are skin scrapes. Their treatment depends on how large and deep the abrasion is. Abrasions do not extend through all layers of the skin. A cut or lesion through all skin layers is called a laceration. HOME CARE INSTRUCTIONS   If you were given a dressing, change it at least once a day or as instructed by your caregiver. If the bandage sticks, soak it off with a solution of water or hydrogen peroxide.   Twice a day, wash the area with soap and water to remove all the cream/ointment. You may do this in a sink, under a tub faucet, or in a shower. Rinse off the soap and pat dry with a clean towel. Look for signs of infection (see below).   Reapply cream/ointment according to your caregiver's instruction. This will help prevent infection and keep the bandage from sticking. Telfa or gauze over the wound and under the dressing or wrap will also help keep the bandage from sticking.   If the bandage becomes wet, dirty, or develops a foul smell, change it as soon as possible.   Only take over-the-counter or prescription medicines for pain, discomfort, or fever as directed by your caregiver.  SEEK IMMEDIATE MEDICAL CARE IF:   Increasing pain in the wound.   Signs of infection develop: redness, swelling, surrounding area is tender to touch, or pus coming from the wound.   You have a fever.   Any foul smell coming from the wound or dressing.  Most skin wounds heal within ten days. Facial wounds heal faster. However, an infection may occur despite proper treatment. You should have the wound checked for signs of infection within 24 to 48 hours or sooner if problems arise. If you were not given a wound-check appointment, look closely at the wound yourself on the second day for early signs of infection listed above. MAKE SURE YOU:   Understand these instructions.   Will watch your condition.   Will get help right away if you are not doing well or get worse.  Document Released:  05/28/2005 Document Revised: 08/07/2011 Document Reviewed: 07/22/2011 [facility] Patient Information 2012 [place], [place].Back Pain, Adult Low back pain is very common. About 1 in 5 people have back pain.The cause of low back pain is rarely dangerous. The pain often gets better over time.About half of people with a sudden onset of back pain feel better in just 2 weeks. About 8 in 10 people feel better by 6 weeks.  CAUSES Some common causes of back pain include:  Strain of the muscles or ligaments supporting the spine.   Wear and tear (degeneration) of the spinal discs.   Arthritis.   Direct injury to the back.  DIAGNOSIS Most of the time, the direct cause of low back pain is not known.However, back pain can be treated effectively even when the exact cause of the pain is unknown.Answering your caregiver's questions about your overall health and symptoms is one of the most accurate ways to make sure the cause of your pain is not dangerous. If your caregiver needs more information, he or she may order lab work or imaging tests (X-rays or MRIs).However, even if imaging tests show changes in your back, this usually does not require surgery. HOME CARE INSTRUCTIONS For many people, back pain returns.Since low back pain is rarely dangerous, it is often a condition that people can learn to [facility] their own.   Remain active. It is stressful on the back to sit or stand in one place.  Do not sit, drive, or stand in one place for more than 30 minutes at a time. Take short walks on level surfaces as soon as pain allows.Try to increase the length of time you walk each day.   Do not stay in bed.Resting more than 1 or 2 days can delay your recovery.   Do not avoid exercise or work.Your body is made to move.It is not dangerous to be active, even though your back may hurt.Your back will likely heal faster if you return to being active before your pain is gone.   Pay attention to your body when you  bend and lift. Many people have less discomfortwhen lifting if they bend their knees, keep the load close to their bodies,and avoid twisting. Often, the most comfortable positions are those that put less stress on your recovering back.   Find a comfortable position to sleep. Use a firm mattress and lie on your side with your knees slightly bent. If you lie on your back, put a pillow under your knees.   Only take over-the-counter or prescription medicines as directed by your caregiver. Over-the-counter medicines to reduce pain and inflammation are often the most helpful.Your caregiver may prescribe muscle relaxant drugs.These medicines help dull your pain so you can more quickly return to your normal activities and healthy exercise.   Put ice on the injured area.   Put ice in a plastic bag.   Place a towel between your skin and the bag.   Leave the ice on for 15 to 20 minutes, 3 to 4 times a day for the first 2 to 3 days. After that, ice and heat may be alternated to reduce pain and spasms.   Ask your caregiver about trying back exercises and gentle massage. This may be of some benefit.   Avoid feeling anxious or stressed.Stress increases muscle tension and can worsen back pain.It is important to recognize when you are anxious or stressed and learn ways to manage it.Exercise is a great option.  SEEK MEDICAL CARE IF:  You have pain that is not relieved with rest or medicine.   You have pain that does not improve in 1 week.   You have new symptoms.   You are generally not feeling well.  SEEK IMMEDIATE MEDICAL CARE IF:   You have pain that radiates from your back into your legs.   You develop new bowel or bladder control problems.   You have unusual weakness or numbness in your arms or legs.   You develop nausea or vomiting.   You develop abdominal pain.   You feel faint.  Document Released: 08/18/2005 Document Revised: 08/07/2011 Document Reviewed: 01/06/2011 [facility]  Patient Information 2012 [place], [place].Facial and Scalp Contusions You have a contusion (bruise) on your face or scalp. Injuries around the face and head generally cause a lot of swelling, especially around the eyes. This is because the blood supply to this area is good and tissues are loose. Swelling from a contusion is usually better in 2-3 days. It may take a week or longer for a "black eye" to clear up completely. HOME CARE INSTRUCTIONS   Apply ice packs to the injured area for about 15 to 20 minutes, 3 to 4 times a day, for the first couple days. This helps keep swelling down.   Use mild pain medicine as needed or instructed by your caregiver.   You may have a mild headache, slight dizziness, nausea, and weakness for a few days. This usually clears  up with bed rest and mild pain medications.   Contact your caregiver if you are concerned about facial defects or have any difficulty with your bite or develop pain with chewing.  SEEK IMMEDIATE MEDICAL CARE IF:  You develop severe pain or a headache, unrelieved by medication.   You develop unusual sleepiness, confusion, personality changes, or vomiting.   You have a persistent nosebleed, double or blurred vision, or drainage from the nose or ear.   You have difficulty walking or using your arms or legs.  MAKE SURE YOU:   Understand these instructions.   Will watch your condition.   Will get help right away if you are not doing well or get worse.  Document Released: 09/25/2004 Document Revised: 08/07/2011 Document Reviewed: 07/04/2011 [facility] Patient Information 2012 [place], [place].

## 2012-01-30 NOTE — ED Notes (Signed)
EMS reports pt from home, pt tripped and fell on concrete surface, hematoma to the right forehead, bleeding controlled at this time pain 8/10

## 2012-01-30 NOTE — ED Notes (Signed)
Ice pack to the right forehead

## 2012-01-31 ENCOUNTER — Encounter (HOSPITAL_COMMUNITY): Payer: Self-pay

## 2012-01-31 ENCOUNTER — Emergency Department (HOSPITAL_COMMUNITY)
Admission: EM | Admit: 2012-01-31 | Discharge: 2012-01-31 | Disposition: A | Discharge status: Home / Self Care | Payer: Medicare Other | Attending: Emergency Medicine | Admitting: Emergency Medicine

## 2012-01-31 DIAGNOSIS — E119 Type 2 diabetes mellitus without complications: Secondary | ICD-10-CM | POA: Insufficient documentation

## 2012-01-31 DIAGNOSIS — M109 Gout, unspecified: Secondary | ICD-10-CM | POA: Insufficient documentation

## 2012-01-31 DIAGNOSIS — Z4801 Encounter for change or removal of surgical wound dressing: Secondary | ICD-10-CM | POA: Insufficient documentation

## 2012-01-31 DIAGNOSIS — Z4800 Encounter for change or removal of nonsurgical wound dressing: Secondary | ICD-10-CM

## 2012-01-31 DIAGNOSIS — IMO0001 Reserved for inherently not codable concepts without codable children: Secondary | ICD-10-CM

## 2012-01-31 DIAGNOSIS — I10 Essential (primary) hypertension: Secondary | ICD-10-CM | POA: Insufficient documentation

## 2012-01-31 DIAGNOSIS — Z79899 Other long term (current) drug therapy: Secondary | ICD-10-CM | POA: Insufficient documentation

## 2012-01-31 DIAGNOSIS — E7800 Pure hypercholesterolemia, unspecified: Secondary | ICD-10-CM | POA: Insufficient documentation

## 2012-01-31 NOTE — ED Notes (Signed)
Pt received to RM 8 with complaint of bleeding from the abrasion to his lt eyebrow and skin tear to rt hand. Pt was seen here in the ER yesterday. Seen and examined by Dr. Rosalia Hammers.

## 2012-01-31 NOTE — ED Provider Notes (Signed)
History   This chart was scribed for Hilario Quarry, MD by Toya Smothers. The patient was seen in room STRE8/STRE8. Patient's care was started at 1131.  CSN: [id number]  Arrival date & time 01/31/12  1131   First MD Initiated Contact with Patient 01/31/12 1143      Chief Complaint  Patient presents with  . Dressing Change   The history is provided by the patient. No language interpreter was used.    [name] is a 78 y.o. male who presents to the Emergency Department for a dressing change and wound check. Pt was seen yesterday forPt states that he was in his yard working and he fell. Pt has h/o Diabetes and irregular heart beat.  Past Medical History  Diagnosis Date  . Pneumonia   . Lymphoma     s/p partial gastrectomy  . Hypertension   . Hypercholesteremia   . Diabetes mellitus   . Gout   . Irregular heart beat   . Kidney stone    Past Surgical History  Procedure Date  . Partial gastrectomy 1994    for lymphoma  . Hernia repair   . Joint replacement   . Back surgery     History reviewed. No pertinent family history.  History  Substance Use Topics  . Smoking status: Never Smoker   . Smokeless tobacco: Never Used  . Alcohol Use: No     Review of Systems  Respiratory: Negative for shortness of breath.   Gastrointestinal: Negative for nausea.  Skin: Positive for wound (R froehand. L fore head.).  Neurological: Negative for weakness.    Allergies  Review of patient's allergies indicates no known allergies.  Home Medications   Current Outpatient Rx  Name Route Sig Dispense Refill  . ACETAMINOPHEN 325 MG PO TABS Oral Take 325 mg by mouth every 6 (six) hours as needed. For pain    . ALLOPURINOL 100 MG PO TABS Oral Take 100 mg by mouth daily.      . ASPIRIN EC 81 MG PO TBEC Oral Take 81 mg by mouth daily.    Marland Kitchen CARVEDILOL 12.5 MG PO TABS Oral Take 12.5 mg by mouth 2 (two) times daily with a meal.     . VITAMIN B 12 PO Oral Take 1 tablet by mouth every  morning.     . CYCLOBENZAPRINE HCL 10 MG PO TABS Oral Take 10 mg by mouth 3 (three) times daily as needed. Muscle spasm    . FUROSEMIDE 20 MG PO TABS Oral Take 20 mg by mouth daily as needed. Water gain    . GLIPIZIDE 10 MG PO TABS Oral Take 10 mg by mouth daily.      Marland Kitchen LISINOPRIL 20 MG PO TABS Oral Take 20 mg by mouth daily.      Marland Kitchen NIACIN 500 MG PO TABS Oral Take 500 mg by mouth at bedtime.     Marland Kitchen PANTOPRAZOLE SODIUM 40 MG PO TBEC Oral Take 40 mg by mouth daily.      Marland Kitchen PIOGLITAZONE HCL 30 MG PO TABS Oral Take 15 mg by mouth daily.     Marland Kitchen RANITIDINE HCL 150 MG PO CAPS Oral Take 150 mg by mouth 2 (two) times daily.      . SENNOSIDES 8.6 MG PO TABS Oral Take 2 tablets by mouth 2 (two) times daily.     . SERTRALINE HCL 100 MG PO TABS Oral Take 200 mg by mouth daily.      Marland Kitchen  SIMVASTATIN 10 MG PO TABS Oral Take 10 mg by mouth at bedtime.        BP 113/60  Pulse 73  Temp(Src) 97.3 F (36.3 C) (Oral)  Resp 18  SpO2 98%  Physical Exam  Nursing note and vitals reviewed. Constitutional: He is oriented to person, place, and time. He appears well-developed and well-nourished. No distress.  HENT:  Head: Normocephalic and atraumatic.  Eyes: EOM are normal.       Abrasion to the L forehead, not actively bleeding.  Neck: Neck supple. No tracheal deviation present.  Cardiovascular: Normal rate.   Pulmonary/Chest: Effort normal. No respiratory distress.  Musculoskeletal: Normal range of motion.       Abrasion to the R hand, not actively bleeding.  Neurological: He is alert and oriented to person, place, and time.  Skin: Skin is warm and dry.  Psychiatric: He has a normal mood and affect. His behavior is normal.    ED Course  Procedures (including critical care time)  DIAGNOSTIC STUDIES: Oxygen Saturation is 98% on room air, normal by my interpretation.    COORDINATION OF CARE: 11:51PM- Checked wounds  Labs Reviewed - No data to display Ct Head Wo Contrast  01/30/2012  *RADIOLOGY REPORT*   Clinical Data: Larey Seat and hit head  CT HEAD WITHOUT CONTRAST  Technique:  Contiguous axial images were obtained from the base of the skull through the vertex without contrast.  Comparison: CT 12/08/2011  Findings: Generalized atrophy.  No acute infarct, mass, or hemorrhage.  Left frontal scalp hematoma.  Negative for skull fracture  IMPRESSION: No acute intracranial abnormality.  Original Report Authenticated By: Camelia Phenes, M.D.   No diagnosis found.   MDM  I personally performed the services described in this documentation, which was scribed in my presence. The recorded information has been reviewed and considered.    Hilario Quarry, MD 01/31/12 1344

## 2012-01-31 NOTE — ED Notes (Signed)
Pt here for dressing change to left fore head, sts seen yesterday for same

## 2012-01-31 NOTE — Discharge Instructions (Signed)
Wound Check Your wound appears healthy today. Your wound will heal gradually over time. Eventually a scar will form that will fade with time. FACTORS THAT AFFECT SCAR FORMATION:  People differ in the severity in which they scar.   Scar severity varies according to location, size, and the traits you inherited from your parents (genetic predisposition).   Irritation to the wound from infection, rubbing, or chemical exposure will increase the amount of scar formation.  HOME CARE INSTRUCTIONS   If you were given a dressing, you should change it at least once a day or as instructed by your caregiver. If the bandage sticks, soak it off with a solution of hydrogen peroxide.   If the bandage becomes wet, dirty, or develops a bad smell, change it as soon as possible.   Look for signs of infection.   Only take over-the-counter or prescription medicines for pain, discomfort, or fever as directed by your caregiver.  SEEK IMMEDIATE MEDICAL CARE IF:   You have redness, swelling, or increasing pain in the wound.   You notice pus coming from the wound.   You have a fever.   You notice a bad smell coming from the wound or dressing.  Document Released: 05/24/2004 Document Revised: 08/07/2011 Document Reviewed: 08/18/2005 [facility] Patient Information 2012 [place], [place].Skin Tear Laceration Care A skin tear is when the top layer of skin has been peeled off. This is a common problem with aging as the skin becomes thinner and more fragile. This is often repaired with tape or skin adhesive strips. This keeps the skin that has been peeled off in contact with the healthier skin beneath. Sometimes the peeled skin will grow back on but often it turns black and dies. Even when this happens, the torn skin acts as a good dressing until the skin underneath gets healthier and repairs itself. HOME CARE INSTRUCTIONS  Only take over-the-counter or prescription medicines for pain, discomfort or fever as directed by  your caregiver.  A dressing, depending on the location of the laceration, may have been applied over the tape or skin adhesive strips. This may be changed once per day or as instructed. If the dressing sticks, it may be soaked off with warm soapy water.  You might need a tetanus shot now if: You have no idea when you had the last one.  You have never had a tetanus shot before.  Your wound had dirt in it.  If you need a tetanus shot, and you choose not to get one, there is a rare chance of getting tetanus. Sickness from tetanus can be serious. If you got a tetanus shot, your arm may swell, get red and warm to the touch at the shot site. This is common and not a problem. SEEK IMMEDIATE MEDICAL CARE IF:  There is redness, swelling or increasing pain in the wound.  Pus is coming from wound.  An unexplained oral temperature above 102 F (38.9 C) develops.  You notice a bad smell coming from the wound or dressing.  Document Released: 05/13/2001 Document Revised: 08/07/2011 Document Reviewed: 11/20/2008 [facility] Patient Information 2012 [place], [place].

## 2012-03-05 ENCOUNTER — Encounter (HOSPITAL_COMMUNITY): Payer: Self-pay | Admitting: Emergency Medicine

## 2012-03-05 ENCOUNTER — Emergency Department (HOSPITAL_COMMUNITY): Payer: Non-veteran care

## 2012-03-05 ENCOUNTER — Emergency Department (HOSPITAL_COMMUNITY)
Admission: EM | Admit: 2012-03-05 | Discharge: 2012-03-06 | Disposition: A | Discharge status: Home / Self Care | Payer: Non-veteran care | Attending: Emergency Medicine | Admitting: Emergency Medicine

## 2012-03-05 DIAGNOSIS — R51 Headache: Secondary | ICD-10-CM

## 2012-03-05 DIAGNOSIS — M109 Gout, unspecified: Secondary | ICD-10-CM | POA: Insufficient documentation

## 2012-03-05 DIAGNOSIS — E7800 Pure hypercholesterolemia, unspecified: Secondary | ICD-10-CM | POA: Insufficient documentation

## 2012-03-05 DIAGNOSIS — E119 Type 2 diabetes mellitus without complications: Secondary | ICD-10-CM | POA: Insufficient documentation

## 2012-03-05 DIAGNOSIS — Z7982 Long term (current) use of aspirin: Secondary | ICD-10-CM | POA: Insufficient documentation

## 2012-03-05 DIAGNOSIS — Z79899 Other long term (current) drug therapy: Secondary | ICD-10-CM | POA: Insufficient documentation

## 2012-03-05 DIAGNOSIS — I10 Essential (primary) hypertension: Secondary | ICD-10-CM | POA: Insufficient documentation

## 2012-03-05 DIAGNOSIS — K5900 Constipation, unspecified: Secondary | ICD-10-CM | POA: Insufficient documentation

## 2012-03-05 DIAGNOSIS — M25469 Effusion, unspecified knee: Secondary | ICD-10-CM

## 2012-03-05 LAB — CBC
Hemoglobin: 13.3 g/dL (ref 13.0–17.0)
MCHC: 34.5 g/dL (ref 30.0–36.0)
Platelets: 154 10*3/uL (ref 150–400)
RBC: 4.14 MIL/uL — ABNORMAL LOW (ref 4.22–5.81)

## 2012-03-05 LAB — BASIC METABOLIC PANEL
GFR calc non Af Amer: 38 mL/min — ABNORMAL LOW (ref >90)
Glucose, Bld: 74 mg/dL (ref 70–99)
Potassium: 4.5 mEq/L (ref 3.5–5.1)
Sodium: 132 mEq/L — ABNORMAL LOW (ref 135–145)

## 2012-03-05 LAB — POCT I-STAT TROPONIN I: Troponin i, poc: 0.01 ng/mL (ref 0.00–0.08)

## 2012-03-05 MED ORDER — HYDROMORPHONE HCL PF 1 MG/ML IJ SOLN
1.0000 mg | Freq: Once | INTRAMUSCULAR | Status: AC
  Administered 2012-03-05: 1 mg via INTRAMUSCULAR
  Filled 2012-03-05: qty 1

## 2012-03-05 NOTE — ED Notes (Signed)
C/o constipation and headache since yesterday.  Pt states he believes constipation is related to overcooked hamburger he ate yesterday.

## 2012-03-05 NOTE — ED Notes (Signed)
Patient now complaing of chest pain.  Patient moved back into triage for EKG.

## 2012-03-05 NOTE — ED Provider Notes (Signed)
History     CSN: [id number]  Arrival date & time 03/05/12  1943   First MD Initiated Contact with Patient 03/05/12 2316      Chief Complaint  Patient presents with  . Constipation  . Headache    (Consider location/radiation/quality/duration/timing/severity/associated sxs/prior treatment) HPI Comments: 78 year old male with a history of lymphoma, hypertension, diabetes, gout and chronic knee effusions. He presents with a complaint of knee swelling and pain as well as constipation and headache. The patient has chronic symptoms related to these complaints, they have seemed to gotten worse the last couple of days but are not associated with fevers, chills, nausea, vomiting, back pain, rashes. He has been having loose stools most recently earlier this morning but states that he feels like he has to have a bowel movement but has not had them all day long. He also admits to having a headache which is poorly described, persistent, not associated with numbness weakness blurred vision slurred speech or difficulty walking. He walks with a walker or a cane because of his chronic knee effusions which causes chronic pain in his knee. He is currently being seen by the [employer] and planning a knee replacement to be done in the next 2 months. He states that he last had a arthrocentesis of his right knee one month ago.  The symptoms are mild to moderate, persistent, gradually worsening.  Patient is a 78 y.o. male presenting with constipation and headaches. The history is provided by the patient, medical records and a relative.  Constipation  Associated symptoms include headaches.  Headache     Past Medical History  Diagnosis Date  . Pneumonia   . Lymphoma     s/p partial gastrectomy  . Hypertension   . Hypercholesteremia   . Diabetes mellitus   . Gout   . Irregular heart beat   . Kidney stone     Past Surgical History  Procedure Date  . Partial gastrectomy 1994    for lymphoma    . Hernia repair   . Joint replacement   . Back surgery     No family history on file.  History  Substance Use Topics  . Smoking status: Never Smoker   . Smokeless tobacco: Never Used  . Alcohol Use: No      Review of Systems  Gastrointestinal: Positive for constipation.  Neurological: Positive for headaches.  All other systems reviewed and are negative.    Allergies  Review of patient's allergies indicates no known allergies.  Home Medications   Current Outpatient Rx  Name Route Sig Dispense Refill  . ACETAMINOPHEN 325 MG PO TABS Oral Take 325 mg by mouth every 6 (six) hours as needed. For pain    . ALLOPURINOL 100 MG PO TABS Oral Take 100 mg by mouth daily.      . ASPIRIN EC 81 MG PO TBEC Oral Take 81 mg by mouth daily.    Marland Kitchen BISACODYL 5 MG PO TBEC Oral Take 5 mg by mouth daily as needed. For constipation    . ERGOCALCIFEROL 50000 UNITS PO CAPS Oral Take 50,000 Units by mouth every 14 (fourteen) days.    . FUROSEMIDE 20 MG PO TABS Oral Take 20 mg by mouth daily as needed. Water gain    . GLIPIZIDE 10 MG PO TABS Oral Take 10 mg by mouth daily.      Marland Kitchen PIOGLITAZONE HCL 30 MG PO TABS Oral Take 15 mg by mouth daily.     Marland Kitchen POLYETHYLENE GLYCOL  3350 PO PACK Oral Take 17 g by mouth daily.    . SENNOSIDES 8.6 MG PO TABS Oral Take 2 tablets by mouth 2 (two) times daily.     Marland Kitchen CARVEDILOL 12.5 MG PO TABS Oral Take 12.5 mg by mouth 2 (two) times daily with a meal.     . VITAMIN B 12 PO Oral Take 1 tablet by mouth every morning.     . CYCLOBENZAPRINE HCL 10 MG PO TABS Oral Take 10 mg by mouth 3 (three) times daily as needed. Muscle spasm    . LISINOPRIL 20 MG PO TABS Oral Take 20 mg by mouth daily.      Marland Kitchen NIACIN 500 MG PO TABS Oral Take 500 mg by mouth at bedtime.     Marland Kitchen PANTOPRAZOLE SODIUM 40 MG PO TBEC Oral Take 40 mg by mouth daily.      Marland Kitchen RANITIDINE HCL 150 MG PO CAPS Oral Take 150 mg by mouth 2 (two) times daily.      . SERTRALINE HCL 100 MG PO TABS Oral Take 200 mg by mouth  daily.      Marland Kitchen SIMVASTATIN 10 MG PO TABS Oral Take 10 mg by mouth at bedtime.        BP 134/64  Pulse 72  Temp 98.7 F (37.1 C) (Oral)  Resp 14  SpO2 100%  Physical Exam  Nursing note and vitals reviewed. Constitutional: He appears well-developed and well-nourished. No distress.  HENT:  Head: Normocephalic and atraumatic.  Mouth/Throat: Oropharynx is clear and moist. No oropharyngeal exudate.  Eyes: Conjunctivae and EOM are normal. Pupils are equal, round, and reactive to light. Right eye exhibits no discharge. Left eye exhibits no discharge. No scleral icterus.  Neck: Normal range of motion. Neck supple. No JVD present. No thyromegaly present.  Cardiovascular: Normal rate, regular rhythm and intact distal pulses.  Exam reveals no gallop and no friction rub.   Murmur ( Soft systolic murmur) heard. Pulmonary/Chest: Effort normal and breath sounds normal. No respiratory distress. He has no wheezes. He has no rales.  Abdominal: Soft. Bowel sounds are normal. He exhibits no distension and no mass. There is no tenderness. There is no rebound and no guarding.       Large mid right sided ventral hernia. Minimal diffuse abdominal tenderness, no guarding, decreased bowel sounds, soft and non-peritoneal  Musculoskeletal: Normal range of motion. He exhibits edema ( Mild bilateral symmetrical edema) and tenderness ( Tenderness with range of motion of the right knee, clinically a large joint effusion of the right knee.).       No redness or rashes or warmth overlying either knee joint.  Lymphadenopathy:    He has no cervical adenopathy.  Neurological: He is alert. Coordination normal.  Skin: Skin is warm and dry. No rash noted. No erythema.  Psychiatric: He has a normal mood and affect. His behavior is normal.    ED Course  Procedures (including critical care time)  ED ECG REPORT  I personally interpreted this EKG   Date: 03/06/2012   Rate: 76  Rhythm: normal sinus rhythm  QRS Axis: normal   Intervals: PR prolonged  ST/T Wave abnormalities: normal  Conduction Disutrbances:first-degree A-V block  and right bundle branch block  Narrative Interpretation:   Old EKG Reviewed: unchanged and Compared with 10/26/2010   Labs Reviewed  CBC - Abnormal; Notable for the following:    RBC 4.14 (*)     HCT 38.6 (*)     All other components  within normal limits  BASIC METABOLIC PANEL - Abnormal; Notable for the following:    Sodium 132 (*)     BUN 34 (*)     Creatinine, Ser 1.65 (*)     GFR calc non Af Amer 38 (*)     GFR calc Af Amer 44 (*)     All other components within normal limits  POCT I-STAT TROPONIN I   Dg Abd 1 View  03/06/2012  *RADIOLOGY REPORT*  Clinical Data: Abdominal pain and constipation; history of diabetes.  ABDOMEN - 1 VIEW  Comparison: CT of the abdomen and pelvis performed 10/27/2011  Findings: The visualized bowel gas pattern is nonspecific.  There is no evidence of significant bowel dilatation to suggest obstruction; mild small bowel distension may reflect mild small bowel dysmotility.  Stool is noted filling much of the transverse colon.  No free intra-abdominal air is identified, though evaluation for free air is limited on a single supine view.  Degenerative change is noted along the lumbar spine, and mild sclerotic change is noted at the sacroiliac joints.  IMPRESSION: Nonspecific bowel gas pattern; mild small bowel distension may reflect mild small bowel dysmotility.  No evidence for obstruction. Stool noted filling much of the transverse colon.  No definite free intra-abdominal air seen.  Original Report Authenticated By: Tonia Ghent, M.D.     1. Headache   2. Joint effusion of knee       MDM  The patient has normal vital signs,joint effusion consistent with chronic knee effusions and possible decreased bowel movements. He has had a bowel movement within the last 18 hours, will obtain a KUB x-ray to evaluate for stool load, there is no fever, tachycardia,  hypotension or difficulty breathing. He described transient chest pain while in triage however his EKG shows sinus rhythm with a right bundle branch block consistent with prior EKG from the board last year.  Abdominal x-ray shows stool in the transverse colon, no significant constipation, no obstruction, no free air. Vital signs are normal, hydromorphone given for pain.  Patient appears stable for followup.  Vida Roller, MD 03/06/12 [phone]

## 2012-03-06 NOTE — ED Notes (Signed)
Pt returned from radiology.

## 2012-03-06 NOTE — ED Notes (Signed)
No rx given, pt voiced understanding to f/u with PCP and return for worsening in condition.

## 2012-03-08 ENCOUNTER — Encounter (HOSPITAL_COMMUNITY): Payer: Self-pay | Admitting: Emergency Medicine

## 2012-03-08 ENCOUNTER — Emergency Department (HOSPITAL_COMMUNITY)
Admission: EM | Admit: 2012-03-08 | Discharge: 2012-03-08 | Disposition: A | Discharge status: Home / Self Care | Payer: Non-veteran care | Attending: Emergency Medicine | Admitting: Emergency Medicine

## 2012-03-08 DIAGNOSIS — M79609 Pain in unspecified limb: Secondary | ICD-10-CM

## 2012-03-08 DIAGNOSIS — Z7982 Long term (current) use of aspirin: Secondary | ICD-10-CM | POA: Insufficient documentation

## 2012-03-08 DIAGNOSIS — E7800 Pure hypercholesterolemia, unspecified: Secondary | ICD-10-CM | POA: Insufficient documentation

## 2012-03-08 DIAGNOSIS — Z79899 Other long term (current) drug therapy: Secondary | ICD-10-CM | POA: Insufficient documentation

## 2012-03-08 DIAGNOSIS — E119 Type 2 diabetes mellitus without complications: Secondary | ICD-10-CM | POA: Insufficient documentation

## 2012-03-08 DIAGNOSIS — M79622 Pain in left upper arm: Secondary | ICD-10-CM

## 2012-03-08 DIAGNOSIS — I10 Essential (primary) hypertension: Secondary | ICD-10-CM | POA: Insufficient documentation

## 2012-03-08 DIAGNOSIS — M7989 Other specified soft tissue disorders: Secondary | ICD-10-CM | POA: Insufficient documentation

## 2012-03-08 MED ORDER — OXYCODONE-ACETAMINOPHEN 5-325 MG PO TABS
1.0000 | ORAL_TABLET | Freq: Once | ORAL | Status: AC
  Administered 2012-03-08: 1 via ORAL
  Filled 2012-03-08: qty 1

## 2012-03-08 MED ORDER — OXYCODONE-ACETAMINOPHEN 5-325 MG PO TABS: 1.0000 | ORAL_TABLET | ORAL | Status: AC | PRN

## 2012-03-08 NOTE — ED Notes (Signed)
Pt c/o left forearm pain and swelling where had blood drawn on Saturday; pt denies injury; pt sts tender to palpation in that area; pt sts wants his sugar checked

## 2012-03-08 NOTE — ED Provider Notes (Signed)
History     CSN: [id number]  Arrival date & time 03/08/12  1140   First MD Initiated Contact with Patient 03/08/12 1550      Chief Complaint  Patient presents with  . Arm Pain    (Consider location/radiation/quality/duration/timing/severity/associated sxs/prior treatment) Patient is a 78 y.o. male presenting with arm pain. The history is provided by the patient.  Arm Pain  He had blood drawn from his left arm 2 days ago. Today, he noted swelling in the posterior lateral aspect of the left upper arm with pain in the same area. He denies other injury. Pain is moderate but is unable to replace unremarkable value on it. Nothing makes it better nothing makes it worse. He denies any weakness, numbness, tingling down.  Past Medical History  Diagnosis Date  . Pneumonia   . Lymphoma     s/p partial gastrectomy  . Hypertension   . Hypercholesteremia   . Diabetes mellitus   . Gout   . Irregular heart beat   . Kidney stone     Past Surgical History  Procedure Date  . Partial gastrectomy 1994    for lymphoma  . Hernia repair   . Joint replacement   . Back surgery     History reviewed. No pertinent family history.  History  Substance Use Topics  . Smoking status: Never Smoker   . Smokeless tobacco: Never Used  . Alcohol Use: No      Review of Systems  All other systems reviewed and are negative.    Allergies  Review of patient's allergies indicates no known allergies.  Home Medications   Current Outpatient Rx  Name Route Sig Dispense Refill  . ACETAMINOPHEN 325 MG PO TABS Oral Take 325 mg by mouth every 6 (six) hours as needed. For pain    . ALLOPURINOL 100 MG PO TABS Oral Take 100 mg by mouth daily.      . ASPIRIN EC 81 MG PO TBEC Oral Take 81 mg by mouth daily.    Marland Kitchen BISACODYL 5 MG PO TBEC Oral Take 5 mg by mouth daily as needed. For constipation    . CARVEDILOL 12.5 MG PO TABS Oral Take 12.5 mg by mouth 2 (two) times daily with a meal.     . VITAMIN B 12 PO  Oral Take 1 tablet by mouth every morning.     . CYCLOBENZAPRINE HCL 10 MG PO TABS Oral Take 10 mg by mouth 3 (three) times daily as needed. Muscle spasm    . ERGOCALCIFEROL 50000 UNITS PO CAPS Oral Take 50,000 Units by mouth every 14 (fourteen) days.    . FUROSEMIDE 20 MG PO TABS Oral Take 20 mg by mouth daily as needed. Water gain    . GLIPIZIDE 10 MG PO TABS Oral Take 10 mg by mouth daily.      Marland Kitchen LISINOPRIL 20 MG PO TABS Oral Take 20 mg by mouth daily.      Marland Kitchen NIACIN 500 MG PO TABS Oral Take 500 mg by mouth at bedtime.     Marland Kitchen PANTOPRAZOLE SODIUM 40 MG PO TBEC Oral Take 40 mg by mouth daily.      Marland Kitchen PIOGLITAZONE HCL 30 MG PO TABS Oral Take 15 mg by mouth daily.     Marland Kitchen POLYETHYLENE GLYCOL 3350 PO PACK Oral Take 17 g by mouth daily.    Marland Kitchen RANITIDINE HCL 150 MG PO CAPS Oral Take 150 mg by mouth 2 (two) times daily.      Marland Kitchen  SENNOSIDES 8.6 MG PO TABS Oral Take 2 tablets by mouth 2 (two) times daily.     . SERTRALINE HCL 100 MG PO TABS Oral Take 200 mg by mouth daily.      Marland Kitchen SIMVASTATIN 10 MG PO TABS Oral Take 10 mg by mouth at bedtime.        BP 131/76  Pulse 62  Temp 97.9 F (36.6 C) (Oral)  Resp 16  SpO2 100%  Physical Exam  Nursing note and vitals reviewed.  78 year old male who is resting comfortably and in no acute distress. Vital signs are normal. Oxygen saturation is 100% which is normal. Head is normocephalic and atraumatic. PERRLA, EOMI para thinks is clear. Neck is nontender and supple. Back is nontender. Lungs are clear without rales, wheezes, rhonchi. Heart has regular rate and rhythm without murmur. Abdomen is soft, flat, nontender without masses hepatosplenomegaly. Extremities: Left upper extremity appears normal to my inspection. It can see no abnormal areas of swelling but there is some mild tenderness of the palpation in the posterior lateral aspect of the proximal left upper arm. Full passive range of motion is present without obvious pain. I cannot feel any cord and there is no  erythema the skin. Circumference of the upper arm is equal. He has a brace on his right knee which is not removed. Skin is warm and dry without rash. Neurologic: Mental status is normal, cranial nerves are intact, there no motor or sensory deficits.  ED Course  Procedures (including critical care time)  Labs Reviewed  GLUCOSE, CAPILLARY - Abnormal; Notable for the following:    Glucose-Capillary 183 (*)     All other components within normal limits    1. Pain of left upper arm       MDM  Upper extremity pain. Venous Doppler will be obtained and if negative, he will be treated symptomatically.  Venous Doppler is read as no evidence of DVT. He is in with a prescription for Percocet for pain.    Dione Booze, MD 03/08/12 [phone]

## 2012-03-08 NOTE — Progress Notes (Signed)
VASCULAR LAB PRELIMINARY  PRELIMINARY  PRELIMINARY  PRELIMINARY  Left upper extremity venous Doppler completed.    Preliminary report:  There is no DVT or SVT noted in the left upper extremity.  Dareld Mcauliffe, 03/08/2012, 6:33 PM

## 2012-04-18 ENCOUNTER — Emergency Department (HOSPITAL_COMMUNITY)
Admission: EM | Admit: 2012-04-18 | Discharge: 2012-04-18 | Disposition: A | Discharge status: Home / Self Care | Payer: Non-veteran care | Attending: Emergency Medicine | Admitting: Emergency Medicine

## 2012-04-18 ENCOUNTER — Encounter (HOSPITAL_COMMUNITY): Payer: Self-pay | Admitting: *Deleted

## 2012-04-18 ENCOUNTER — Emergency Department (HOSPITAL_COMMUNITY): Payer: Non-veteran care

## 2012-04-18 DIAGNOSIS — E1169 Type 2 diabetes mellitus with other specified complication: Secondary | ICD-10-CM | POA: Insufficient documentation

## 2012-04-18 DIAGNOSIS — M1120 Other chondrocalcinosis, unspecified site: Secondary | ICD-10-CM | POA: Insufficient documentation

## 2012-04-18 DIAGNOSIS — M109 Gout, unspecified: Secondary | ICD-10-CM | POA: Insufficient documentation

## 2012-04-18 DIAGNOSIS — L97509 Non-pressure chronic ulcer of other part of unspecified foot with unspecified severity: Secondary | ICD-10-CM | POA: Insufficient documentation

## 2012-04-18 DIAGNOSIS — I10 Essential (primary) hypertension: Secondary | ICD-10-CM | POA: Insufficient documentation

## 2012-04-18 DIAGNOSIS — E7800 Pure hypercholesterolemia, unspecified: Secondary | ICD-10-CM | POA: Insufficient documentation

## 2012-04-18 DIAGNOSIS — E11621 Type 2 diabetes mellitus with foot ulcer: Secondary | ICD-10-CM

## 2012-04-18 LAB — COMPREHENSIVE METABOLIC PANEL
ALT: 20 U/L (ref 0–53)
Albumin: 3.8 g/dL (ref 3.5–5.2)
Alkaline Phosphatase: 134 U/L — ABNORMAL HIGH (ref 39–117)
Calcium: 9.3 mg/dL (ref 8.4–10.5)
GFR calc Af Amer: 33 mL/min — ABNORMAL LOW (ref >90)
Glucose, Bld: 146 mg/dL — ABNORMAL HIGH (ref 70–99)
Potassium: 5 mEq/L (ref 3.5–5.1)
Sodium: 137 mEq/L (ref 135–145)
Total Protein: 7.4 g/dL (ref 6.0–8.3)

## 2012-04-18 LAB — CBC WITH DIFFERENTIAL/PLATELET
Eosinophils Relative: 4 % (ref 0–5)
HCT: 39.6 % (ref 39.0–52.0)
Hemoglobin: 13.3 g/dL (ref 13.0–17.0)
Lymphocytes Relative: 38 % (ref 12–46)
MCHC: 33.6 g/dL (ref 30.0–36.0)
MCV: 94.3 fL (ref 78.0–100.0)
Monocytes Absolute: 0.3 10*3/uL (ref 0.1–1.0)
Monocytes Relative: 7 % (ref 3–12)
Neutro Abs: 2.3 10*3/uL (ref 1.7–7.7)
WBC: 4.6 10*3/uL (ref 4.0–10.5)

## 2012-04-18 LAB — URINALYSIS, ROUTINE W REFLEX MICROSCOPIC
Bilirubin Urine: NEGATIVE
Hgb urine dipstick: NEGATIVE
Ketones, ur: NEGATIVE mg/dL
Specific Gravity, Urine: 1.013 (ref 1.005–1.030)
pH: 5.5 (ref 5.0–8.0)

## 2012-04-18 MED ORDER — OXYCODONE-ACETAMINOPHEN 5-325 MG PO TABS: 1.0000 | ORAL_TABLET | Freq: Four times a day (QID) | ORAL | Status: DC | PRN

## 2012-04-18 MED ORDER — CLINDAMYCIN HCL 150 MG PO CAPS: 150.0000 mg | ORAL_CAPSULE | Freq: Four times a day (QID) | ORAL | Status: DC

## 2012-04-18 MED ORDER — OXYCODONE-ACETAMINOPHEN 5-325 MG PO TABS
1.0000 | ORAL_TABLET | Freq: Once | ORAL | Status: AC
  Administered 2012-04-18: 1 via ORAL
  Filled 2012-04-18: qty 1

## 2012-04-18 NOTE — ED Notes (Addendum)
Pt reports diarrhea since yesterday. Denies any specific abdominal pain. Pt reports right knee pain, chronic. Pt reports sore to right foot. Pt is very nonspecific in all complaints.

## 2012-04-18 NOTE — ED Provider Notes (Signed)
History     CSN: [id number]  Arrival date & time 04/18/12  1222   First MD Initiated Contact with Patient 04/18/12 1409      Chief Complaint  Patient presents with  . Diarrhea  . Knee Pain    (Consider location/radiation/quality/duration/timing/severity/associated sxs/prior treatment) HPI Patient presents with complaint of an ulceration on his right second toe. He states that he has a bunion on his right foot and hammertoe and tries to use bandages to alleviate the pressure on all of his toes caused by issues due to these conditions. He states that he noticed about 3-4 days ago that he had a irritated ulceration on the top of the second toe. He has a history of diabetes and was concerned that the lesion may be serious or spread. He also complains of pain in that area. There is no surrounding redness or drainage that he is noticed. He's had no fever or chills. He also notes that because of the pain in his foot his gait has changed which is worsening the pain is chronic arthritic right knee.  Pain is constant, sharp.  There are no other associated systemic symptoms, there are no other alleviating or modifying factors.   Past Medical History  Diagnosis Date  . Pneumonia   . Lymphoma     s/p partial gastrectomy  . Hypertension   . Hypercholesteremia   . Diabetes mellitus   . Gout   . Irregular heart beat   . Kidney stone     Past Surgical History  Procedure Date  . Partial gastrectomy 1994    for lymphoma  . Hernia repair   . Joint replacement   . Back surgery     History reviewed. No pertinent family history.  History  Substance Use Topics  . Smoking status: Never Smoker   . Smokeless tobacco: Never Used  . Alcohol Use: No      Review of Systems ROS reviewed and all otherwise negative except for mentioned in HPI  Allergies  Review of patient's allergies indicates no known allergies.  Home Medications   Current Outpatient Rx  Name Route Sig Dispense Refill  .  ACETAMINOPHEN 325 MG PO TABS Oral Take 325 mg by mouth every 6 (six) hours as needed. For pain    . ALLOPURINOL 100 MG PO TABS Oral Take 100 mg by mouth daily.      . ASPIRIN EC 81 MG PO TBEC Oral Take 81 mg by mouth daily.    Marland Kitchen BISACODYL 5 MG PO TBEC Oral Take 5 mg by mouth daily as needed. For constipation    . CARVEDILOL 12.5 MG PO TABS Oral Take 12.5 mg by mouth 2 (two) times daily with a meal.     . VITAMIN B 12 PO Oral Take 1 tablet by mouth every morning.     . CYCLOBENZAPRINE HCL 10 MG PO TABS Oral Take 10 mg by mouth 3 (three) times daily as needed. Muscle spasm    . ERGOCALCIFEROL 50000 UNITS PO CAPS Oral Take 50,000 Units by mouth every 14 (fourteen) days.    . FUROSEMIDE 20 MG PO TABS Oral Take 20 mg by mouth daily as needed. Water gain    . GLIPIZIDE 10 MG PO TABS Oral Take 10 mg by mouth daily.      Marland Kitchen LISINOPRIL 20 MG PO TABS Oral Take 20 mg by mouth daily.      Marland Kitchen NIACIN 500 MG PO TABS Oral Take 500 mg by mouth at  bedtime.     Marland Kitchen PANTOPRAZOLE SODIUM 40 MG PO TBEC Oral Take 40 mg by mouth daily.      Marland Kitchen PIOGLITAZONE HCL 30 MG PO TABS Oral Take 15 mg by mouth daily.     Marland Kitchen POLYETHYLENE GLYCOL 3350 PO PACK Oral Take 17 g by mouth daily.    Marland Kitchen RANITIDINE HCL 150 MG PO CAPS Oral Take 150 mg by mouth 2 (two) times daily.      . SENNOSIDES 8.6 MG PO TABS Oral Take 2 tablets by mouth 2 (two) times daily.     . SERTRALINE HCL 100 MG PO TABS Oral Take 200 mg by mouth daily.      Marland Kitchen SIMVASTATIN 10 MG PO TABS Oral Take 10 mg by mouth at bedtime.      Marland Kitchen CLINDAMYCIN HCL 150 MG PO CAPS Oral Take 1 capsule (150 mg total) by mouth every 6 (six) hours. 28 capsule 0  . OXYCODONE-ACETAMINOPHEN 5-325 MG PO TABS Oral Take 1-2 tablets by mouth every 6 (six) hours as needed for pain. 15 tablet 0    BP 132/73  Pulse 64  Temp 98.5 F (36.9 C) (Oral)  Resp 16  SpO2 98% Vitals reviewed Physical Exam Physical Examination: General appearance - alert, well appearing, and in no distress Mental status -  alert, oriented to person, place, and time Eyes - no conjunctival injection, no scleral icterus Mouth - mucous membranes moist, pharynx normal without lesions Chest - clear to auscultation, no wheezes, rales or rhonchi, symmetric air entry Heart - normal rate, regular rhythm, normal S1, S2, no murmurs, rubs, clicks or gallops Abdomen - soft, nontender, nondistended, no masses or organomegaly Neurological - alert, oriented, normal speech, strength 5/5 in extremities x 4, sensation intact Musculoskeletal - no joint tenderness, deformity or swelling Extremities - peripheral pulses normal, no pedal edema, no clubbing or cyanosis Skin - right foot with bunion, on dorsal surface of 2nd toe there is a 2-66mm area of erosion, no surrounding erythema no prurulent drainage  ED Course  Procedures (including critical care time)  Labs Reviewed  COMPREHENSIVE METABOLIC PANEL - Abnormal; Notable for the following:    Glucose, Bld 146 (*)     BUN 49 (*)     Creatinine, Ser 2.11 (*)     Alkaline Phosphatase 134 (*)     GFR calc non Af Amer 28 (*)     GFR calc Af Amer 33 (*)     All other components within normal limits  CBC WITH DIFFERENTIAL - Abnormal; Notable for the following:    RBC 4.20 (*)     All other components within normal limits  URINALYSIS, ROUTINE W REFLEX MICROSCOPIC - Abnormal; Notable for the following:    Protein, ur 30 (*)     All other components within normal limits  URINE MICROSCOPIC-ADD ON  LAB REPORT - SCANNED   Dg Knee Complete 4 Views Right  04/18/2012  *RADIOLOGY REPORT*  Clinical Data: 78 year old male with pain times 1 week.  History of gout, diabetes.  RIGHT KNEE - COMPLETE 4+ VIEW  Comparison: 08/17/2011.  Findings: Moderate to large joint effusion, increased from prior. Advanced tricompartmental degenerative spurring.  Severe chondrocalcinosis in the medial compartment appears increased and is now associated with discrete loose bodies.  Degenerative ossification of the  patellar tendon.  No acute fracture or dislocation.  Calcified atherosclerosis in the right lower extremity.  IMPRESSION: 1.  Moderate-to-large joint effusion, increased from prior. 2.  Advanced degenerative changes, especially  at the medial compartment where there is evidence of chondrocalcinosis (which can be seen in the setting of calcium pyrophosphate deposition disease) and now loose bodies.  Original Report Authenticated By: Harley Hallmark, M.D.   Dg Foot Complete Right  04/18/2012  *RADIOLOGY REPORT*  Clinical Data: 78 year old male with pain.  Soft tissue ulcers on the toes. Diabetes.  Gout.  RIGHT FOOT COMPLETE - 3+ VIEW  Comparison: 06/09/2009.  Findings: Chronic hallux valgus and metatarsus primus varus. Extensive calcified atherosclerosis in the right foot.  Chronic deformity of the right fifth proximal phalanx suspected.  Right first MTP and IP joint space loss and subchondral sclerosis.  No subcutaneous gas identified.  No areas of osteolysis identified. No acute fracture or dislocation identified.  Calcaneus appears intact.  IMPRESSION: Advanced chronic and degenerative changes in the right foot with no acute fracture or plain radiographic evidence of osteomyelitis identified.  Original Report Authenticated By: Harley Hallmark, M.D.     1. Diabetic foot ulcer       MDM  Pt presenting with c/o foot ulceration in the setting of known diabetes- blood sugar reasonably well controlled at 146.  No signs of active infection.  Xray shows no osteomyelitis.  Pt also c/o worsening of his chronic right knee pain due to arthritis- no overlying warmth or erythema to suggest septic joint.  Pt given clindmaycin to cover for diabetic foot ulcer- wound dressed.  Referred to PMD for further wound care.  He is agreeable with the plan for discharge and was given strict return precautions.           Ethelda Chick, MD 04/19/12 1058

## 2012-04-25 ENCOUNTER — Emergency Department (HOSPITAL_COMMUNITY)
Admission: EM | Admit: 2012-04-25 | Discharge: 2012-04-25 | Disposition: A | Discharge status: Home / Self Care | Payer: Medicare Other | Attending: Emergency Medicine | Admitting: Emergency Medicine

## 2012-04-25 ENCOUNTER — Encounter (HOSPITAL_COMMUNITY): Payer: Self-pay | Admitting: Emergency Medicine

## 2012-04-25 DIAGNOSIS — M109 Gout, unspecified: Secondary | ICD-10-CM | POA: Insufficient documentation

## 2012-04-25 DIAGNOSIS — L97509 Non-pressure chronic ulcer of other part of unspecified foot with unspecified severity: Secondary | ICD-10-CM | POA: Insufficient documentation

## 2012-04-25 DIAGNOSIS — Z79899 Other long term (current) drug therapy: Secondary | ICD-10-CM | POA: Insufficient documentation

## 2012-04-25 DIAGNOSIS — E1169 Type 2 diabetes mellitus with other specified complication: Secondary | ICD-10-CM | POA: Insufficient documentation

## 2012-04-25 DIAGNOSIS — I10 Essential (primary) hypertension: Secondary | ICD-10-CM | POA: Insufficient documentation

## 2012-04-25 DIAGNOSIS — E7800 Pure hypercholesterolemia, unspecified: Secondary | ICD-10-CM | POA: Insufficient documentation

## 2012-04-25 LAB — GLUCOSE, CAPILLARY
Glucose-Capillary: 107 mg/dL — ABNORMAL HIGH (ref 70–99)
Glucose-Capillary: 69 mg/dL — ABNORMAL LOW (ref 70–99)

## 2012-04-25 MED ORDER — OXYCODONE-ACETAMINOPHEN 5-325 MG PO TABS: 1.0000 | ORAL_TABLET | Freq: Four times a day (QID) | ORAL | Status: AC | PRN

## 2012-04-25 MED ORDER — OXYCODONE-ACETAMINOPHEN 5-325 MG PO TABS
1.0000 | ORAL_TABLET | Freq: Once | ORAL | Status: AC
  Administered 2012-04-25: 1 via ORAL
  Filled 2012-04-25: qty 1

## 2012-04-25 NOTE — ED Notes (Signed)
Pt states he also has pain in right knee, states he thinks he needs knee replacement. Pt also has bandage over left toes, but does not have any complaints. Patient states he is a diabetic.

## 2012-04-25 NOTE — ED Notes (Signed)
C/o pain to R 2nd toe x 1 week.  Pt states he had a sore on it and was seen in ED 1 week ago for same.  States he has been changing the bandage on it but it continues to hurt.  Also c/o chronic R knee pain.

## 2012-04-25 NOTE — ED Notes (Signed)
satient states he is a bad diabitic and he needs his blood sugar checked, i will inform rn jessica.

## 2012-04-25 NOTE — ED Provider Notes (Signed)
History  Scribed for Gwyneth Sprout, MD, the patient was seen in room TR06C/TR06C. This chart was scribed by Candelaria Stagers. The patient's care started at 7:38 PM   CSN: [id number]  Arrival date & time 04/25/12  1509   First MD Initiated Contact with Patient 04/25/12 1932      Chief Complaint  Patient presents with  . Toe Pain     The history is provided by the patient. No language interpreter was used.   [name] is a 78 y.o. male who presents to the Emergency Department complaining of pain to the right second toe that started about 1 week ago.  There is a raw area of skin over the toe.  He has sensation to the toe.  He has kept a bandage on the toe.  He denies fever or chills.  He has h/o diabetes and gout.  Pt reports he is out of pain medication.    Past Medical History  Diagnosis Date  . Pneumonia   . Lymphoma     s/p partial gastrectomy  . Hypertension   . Hypercholesteremia   . Diabetes mellitus   . Gout   . Irregular heart beat   . Kidney stone     Past Surgical History  Procedure Date  . Partial gastrectomy 1994    for lymphoma  . Hernia repair   . Joint replacement   . Back surgery     No family history on file.  History  Substance Use Topics  . Smoking status: Never Smoker   . Smokeless tobacco: Never Used  . Alcohol Use: No      Review of Systems  Musculoskeletal: Positive for arthralgias (pain to the second right toe).  All other systems reviewed and are negative.    Allergies  Review of patient's allergies indicates no known allergies.  Home Medications   Current Outpatient Rx  Name Route Sig Dispense Refill  . CLINDAMYCIN HCL 150 MG PO CAPS Oral Take 150 mg by mouth 4 (four) times daily. Starting on 04/18/12 and ending on 04/28/12    . OXYCODONE-ACETAMINOPHEN 5-325 MG PO TABS Oral Take 1-2 tablets by mouth every 6 (six) hours as needed. For pain ( starting on 04/18/12 and ending on 04/23/12)    . ACETAMINOPHEN 325 MG PO TABS Oral  Take 325 mg by mouth every 6 (six) hours as needed. For pain    . ALLOPURINOL 100 MG PO TABS Oral Take 100 mg by mouth daily.      . ASPIRIN EC 81 MG PO TBEC Oral Take 81 mg by mouth daily.    Marland Kitchen BISACODYL 5 MG PO TBEC Oral Take 5 mg by mouth daily as needed. For constipation    . CARVEDILOL 12.5 MG PO TABS Oral Take 12.5 mg by mouth 2 (two) times daily with a meal.     . VITAMIN B 12 PO Oral Take 1 tablet by mouth every morning.     . CYCLOBENZAPRINE HCL 10 MG PO TABS Oral Take 10 mg by mouth 3 (three) times daily as needed. Muscle spasm    . ERGOCALCIFEROL 50000 UNITS PO CAPS Oral Take 50,000 Units by mouth every 14 (fourteen) days.    . FUROSEMIDE 20 MG PO TABS Oral Take 20 mg by mouth daily as needed. Water gain    . GLIPIZIDE 10 MG PO TABS Oral Take 10 mg by mouth daily.      Marland Kitchen LISINOPRIL 20 MG PO TABS Oral Take 20 mg  by mouth daily.      Marland Kitchen NIACIN 500 MG PO TABS Oral Take 500 mg by mouth at bedtime.     Marland Kitchen PANTOPRAZOLE SODIUM 40 MG PO TBEC Oral Take 40 mg by mouth daily.      Marland Kitchen PIOGLITAZONE HCL 30 MG PO TABS Oral Take 15 mg by mouth daily.     Marland Kitchen POLYETHYLENE GLYCOL 3350 PO PACK Oral Take 17 g by mouth daily.    Marland Kitchen RANITIDINE HCL 150 MG PO CAPS Oral Take 150 mg by mouth 2 (two) times daily.      . SENNOSIDES 8.6 MG PO TABS Oral Take 2 tablets by mouth 2 (two) times daily.     . SERTRALINE HCL 100 MG PO TABS Oral Take 200 mg by mouth daily.      Marland Kitchen SIMVASTATIN 10 MG PO TABS Oral Take 10 mg by mouth at bedtime.        BP 135/80  Pulse 59  Temp 98.4 F (36.9 C) (Oral)  Resp 16  SpO2 100%  Physical Exam  Nursing note and vitals reviewed. Constitutional: He is oriented to person, place, and time. He appears well-developed and well-nourished. No distress.  HENT:  Head: Normocephalic and atraumatic.  Eyes: Pupils are equal, round, and reactive to light.  Neck: Normal range of motion.  Cardiovascular: Normal rate and regular rhythm.   Pulmonary/Chest: Effort normal. No respiratory  distress. He has no wheezes. He has no rales.  Abdominal:       Ventral hernia and surgical scars.    Musculoskeletal: Normal range of motion. He exhibits no edema.       Small one cm diabetic ulcer on second right toe over the joint.  There is no erythema, no drainage, no redness, no swelling.    Neurological: He is alert and oriented to person, place, and time.  Skin: Skin is warm and dry. He is not diaphoretic.  Psychiatric: He has a normal mood and affect. His behavior is normal.    ED Course  Procedures   DIAGNOSTIC STUDIES: Oxygen Saturation is 100% on room air, normal by my interpretation.    COORDINATION OF CARE:     Labs Reviewed  GLUCOSE, CAPILLARY - Abnormal; Notable for the following:    Glucose-Capillary 107 (*)     All other components within normal limits   No results found.   1. Diabetic foot ulcer       MDM   Patient with evidence of a diabetic toe ulcer. There is no sign of tracking, pus drainage and it appears to be superficial in nature. He states that he is currently still taking the clindamycin and will finish in 3 days. However he states he ran out of his Percocet and is having severe pain. The toe has recently been imaged without any signs of osteo-. He was given followup to the wound center for further care.  I personally performed the services described in this documentation, which was scribed in my presence.  The recorded information has been reviewed and considered.       Gwyneth Sprout, MD 04/25/12 [phone]

## 2012-04-30 ENCOUNTER — Encounter (HOSPITAL_BASED_OUTPATIENT_CLINIC_OR_DEPARTMENT_OTHER): Payer: Medicare Other | Attending: General Surgery

## 2012-04-30 DIAGNOSIS — I10 Essential (primary) hypertension: Secondary | ICD-10-CM | POA: Insufficient documentation

## 2012-04-30 DIAGNOSIS — Z79899 Other long term (current) drug therapy: Secondary | ICD-10-CM | POA: Insufficient documentation

## 2012-04-30 DIAGNOSIS — C8589 Other specified types of non-Hodgkin lymphoma, extranodal and solid organ sites: Secondary | ICD-10-CM | POA: Insufficient documentation

## 2012-04-30 DIAGNOSIS — L97509 Non-pressure chronic ulcer of other part of unspecified foot with unspecified severity: Secondary | ICD-10-CM | POA: Insufficient documentation

## 2012-04-30 DIAGNOSIS — E1169 Type 2 diabetes mellitus with other specified complication: Secondary | ICD-10-CM | POA: Insufficient documentation

## 2012-04-30 DIAGNOSIS — M109 Gout, unspecified: Secondary | ICD-10-CM | POA: Insufficient documentation

## 2012-04-30 LAB — GLUCOSE, CAPILLARY: Glucose-Capillary: 71 mg/dL (ref 70–99)

## 2012-04-30 NOTE — Progress Notes (Signed)
[facility]  NAME:  [name], [name]              ACCOUNT NO.:  [id number]  MEDICAL RECORD NO.:  [id number]      DATE OF BIRTH:  03/23/1932  PHYSICIAN:  Ardath Sax, M.D.           VISIT DATE:                                  OFFICE VISIT   This is a 78 year old African American male who comes to [place] because of a diabetic foot ulcer on the dorsal aspect of his right second toe.  It is about 5 mm in diameter and on looking at it, it looks like it goes down to the periosteum.  This gentleman has a long history of many problems including lymphoma, hypertension, type 2 diabetes, gout arthritis with chronic knee effusions that are drained occasionally, and he is on many medicines including allopurinol, aspirin, Lasix, glipizide, pioglitazone, carvedilol, vitamins, lisinopril, niacin, ranitidine, sertraline, and simvastatin.  He is very alert and cooperative.  His vital signs were temperature 98, blood pressure 124/79, pulse 60, respirations 18.  The wound was treated today with Endoform, and he will come back here in a week, and we will continue with Endoform treatments.     Ardath Sax, M.D.     PP/MEDQ  D:  04/30/2012  T:  04/30/2012  Job:  [id number]

## 2012-05-07 ENCOUNTER — Encounter (HOSPITAL_BASED_OUTPATIENT_CLINIC_OR_DEPARTMENT_OTHER): Payer: Medicare Other | Attending: General Surgery

## 2012-05-07 DIAGNOSIS — L97509 Non-pressure chronic ulcer of other part of unspecified foot with unspecified severity: Secondary | ICD-10-CM | POA: Insufficient documentation

## 2012-05-07 DIAGNOSIS — E1169 Type 2 diabetes mellitus with other specified complication: Secondary | ICD-10-CM | POA: Insufficient documentation

## 2012-06-16 ENCOUNTER — Encounter (HOSPITAL_COMMUNITY): Payer: Self-pay | Admitting: *Deleted

## 2012-06-16 ENCOUNTER — Emergency Department (HOSPITAL_COMMUNITY)
Admission: EM | Admit: 2012-06-16 | Discharge: 2012-06-17 | Disposition: A | Discharge status: Home / Self Care | Payer: Non-veteran care | Attending: Emergency Medicine | Admitting: Emergency Medicine

## 2012-06-16 DIAGNOSIS — E7800 Pure hypercholesterolemia, unspecified: Secondary | ICD-10-CM | POA: Insufficient documentation

## 2012-06-16 DIAGNOSIS — M25469 Effusion, unspecified knee: Secondary | ICD-10-CM | POA: Insufficient documentation

## 2012-06-16 DIAGNOSIS — Z79899 Other long term (current) drug therapy: Secondary | ICD-10-CM | POA: Insufficient documentation

## 2012-06-16 DIAGNOSIS — Z7982 Long term (current) use of aspirin: Secondary | ICD-10-CM | POA: Insufficient documentation

## 2012-06-16 DIAGNOSIS — M25569 Pain in unspecified knee: Secondary | ICD-10-CM | POA: Insufficient documentation

## 2012-06-16 DIAGNOSIS — I10 Essential (primary) hypertension: Secondary | ICD-10-CM | POA: Insufficient documentation

## 2012-06-16 DIAGNOSIS — E119 Type 2 diabetes mellitus without complications: Secondary | ICD-10-CM | POA: Insufficient documentation

## 2012-06-16 DIAGNOSIS — Z8639 Personal history of other endocrine, nutritional and metabolic disease: Secondary | ICD-10-CM | POA: Insufficient documentation

## 2012-06-16 DIAGNOSIS — Z862 Personal history of diseases of the blood and blood-forming organs and certain disorders involving the immune mechanism: Secondary | ICD-10-CM | POA: Insufficient documentation

## 2012-06-16 LAB — GLUCOSE, CAPILLARY: Glucose-Capillary: 237 mg/dL — ABNORMAL HIGH (ref 70–99)

## 2012-06-16 MED ORDER — HYDROCODONE-ACETAMINOPHEN 5-325 MG PO TABS
1.0000 | ORAL_TABLET | Freq: Once | ORAL | Status: AC
  Administered 2012-06-16: 1 via ORAL
  Filled 2012-06-16: qty 1

## 2012-06-16 NOTE — ED Provider Notes (Signed)
History     CSN: [id number]  Arrival date & time 06/16/12  2022   First MD Initiated Contact with Patient 06/16/12 2219      Chief Complaint  Patient presents with  . knee bleeding    HPI  History provided by the patient. Patient is a 78 year old male with history of hypertension, hypercholesterolemia, diabetes and gout who presents with concerns for bleeding from his knee following a knee aspiration procedure earlier today. Patient states he was seen at the [facility] and had fluid taken off his knee as well as a cortisone shot into the right knee. When he returned home he began having small amount of bleeding from the needle location. Bleeding seemed to be persistent and he wrapped a bandage over it. Once arriving to the emergency room he had a new bandage and pressure placed since reports bleeding has been controlled. He is not currently on any blood thinner medications. Change to increased pain in the knee. Denies any fever, chills or sweats. Denies any numbness in the leg.    Past Medical History  Diagnosis Date  . Pneumonia   . Lymphoma     s/p partial gastrectomy  . Hypertension   . Hypercholesteremia   . Diabetes mellitus   . Gout   . Irregular heart beat   . Kidney stone     Past Surgical History  Procedure Date  . Partial gastrectomy 1994    for lymphoma  . Hernia repair   . Joint replacement   . Back surgery     No family history on file.  History  Substance Use Topics  . Smoking status: Never Smoker   . Smokeless tobacco: Never Used  . Alcohol Use: No      Review of Systems  Constitutional: Negative for fever, chills and unexpected weight change.  Musculoskeletal: Positive for arthralgias.  Neurological: Negative for weakness and numbness.    Allergies  Review of patient's allergies indicates no known allergies.  Home Medications   Current Outpatient Rx  Name Route Sig Dispense Refill  . ACETAMINOPHEN 325 MG PO TABS Oral Take 325 mg by  mouth every 6 (six) hours as needed. For pain    . ALLOPURINOL 100 MG PO TABS Oral Take 100 mg by mouth daily.      . ASPIRIN EC 81 MG PO TBEC Oral Take 81 mg by mouth daily.    Marland Kitchen BISACODYL 5 MG PO TBEC Oral Take 5 mg by mouth daily as needed. For constipation    . CARVEDILOL 12.5 MG PO TABS Oral Take 12.5 mg by mouth 2 (two) times daily with a meal.     . VITAMIN B 12 PO Oral Take 1 tablet by mouth every morning.     . CYCLOBENZAPRINE HCL 10 MG PO TABS Oral Take 10 mg by mouth 3 (three) times daily as needed. For muscle spasms    . ERGOCALCIFEROL 50000 UNITS PO CAPS Oral Take 50,000 Units by mouth every 14 (fourteen) days.    . FUROSEMIDE 20 MG PO TABS Oral Take 20 mg by mouth daily as needed. For fluid retention    . GLIPIZIDE 10 MG PO TABS Oral Take 10 mg by mouth daily.      Marland Kitchen LISINOPRIL 20 MG PO TABS Oral Take 20 mg by mouth daily.      Marland Kitchen NIACIN 500 MG PO TABS Oral Take 500 mg by mouth at bedtime.     . OXYCODONE-ACETAMINOPHEN 5-325 MG PO TABS Oral  Take 1-2 tablets by mouth every 6 (six) hours as needed. For pain ( starting on 04/18/12 and ending on 04/23/12)    . PANTOPRAZOLE SODIUM 40 MG PO TBEC Oral Take 40 mg by mouth daily.      Marland Kitchen PIOGLITAZONE HCL 30 MG PO TABS Oral Take 15 mg by mouth daily.     Marland Kitchen POLYETHYLENE GLYCOL 3350 PO PACK Oral Take 17 g by mouth daily.    Marland Kitchen RANITIDINE HCL 150 MG PO CAPS Oral Take 150 mg by mouth 2 (two) times daily.      . SENNOSIDES 8.6 MG PO TABS Oral Take 2 tablets by mouth 2 (two) times daily.     . SERTRALINE HCL 100 MG PO TABS Oral Take 200 mg by mouth daily.      Marland Kitchen SIMVASTATIN 10 MG PO TABS Oral Take 10 mg by mouth at bedtime.        BP 157/80  Pulse 67  Temp 98 F (36.7 C) (Oral)  Resp 19  SpO2 98%  Physical Exam  Nursing note and vitals reviewed. Constitutional: He is oriented to person, place, and time. He appears well-developed and well-nourished. No distress.  HENT:  Head: Normocephalic.  Cardiovascular: Normal rate and regular rhythm.     Pulmonary/Chest: Effort normal and breath sounds normal.  Musculoskeletal:       Moderate swelling to right knee with some tenderness. There is a puncture wound to the inferior lateral aspect consistent with history of recent aspiration and cortisone injection. No active bleeding at this time. No erythema of the skin. Slightly reduced range of motion secondary to pain and swelling otherwise normal.  Neurological: He is alert and oriented to person, place, and time.  Skin: Skin is warm. No erythema.  Psychiatric: He has a normal mood and affect. His behavior is normal.    ED Course  Procedures   Labs Reviewed  GLUCOSE, CAPILLARY - Abnormal; Notable for the following:    Glucose-Capillary 237 (*)     All other components within normal limits     1. Knee pain       MDM  11:10PM he waited. Patient no acute distress. Bleeding controlled.        Angus Seller, PA 06/17/12 0020

## 2012-06-16 NOTE — ED Notes (Signed)
The pt was seen at [facility] today.  He had fluid drawn off his rt knee and cortizone injection in that same knee.  It was injected at approx 1400.   Since then he has had bleeding from the injection site on his rt knee. Bandage removed not removed from the site.  Reinforced bandage to see if it bleeds through.  He takes only aspirin. No other blood thinners

## 2012-06-18 NOTE — ED Provider Notes (Signed)
Medical screening examination/treatment/procedure(s) were performed by non-physician practitioner and as supervising physician I was immediately available for consultation/collaboration.   Thomasina Housley, MD 06/18/12 1531 

## 2012-07-19 ENCOUNTER — Encounter (HOSPITAL_COMMUNITY): Payer: Self-pay | Admitting: Adult Health

## 2012-07-19 ENCOUNTER — Emergency Department (HOSPITAL_COMMUNITY)
Admission: EM | Admit: 2012-07-19 | Discharge: 2012-07-19 | Disposition: A | Discharge status: Home / Self Care | Payer: Medicare Other | Attending: Emergency Medicine | Admitting: Emergency Medicine

## 2012-07-19 DIAGNOSIS — Z8701 Personal history of pneumonia (recurrent): Secondary | ICD-10-CM | POA: Insufficient documentation

## 2012-07-19 DIAGNOSIS — E119 Type 2 diabetes mellitus without complications: Secondary | ICD-10-CM | POA: Insufficient documentation

## 2012-07-19 DIAGNOSIS — N200 Calculus of kidney: Secondary | ICD-10-CM | POA: Insufficient documentation

## 2012-07-19 DIAGNOSIS — R609 Edema, unspecified: Secondary | ICD-10-CM | POA: Insufficient documentation

## 2012-07-19 DIAGNOSIS — Z79899 Other long term (current) drug therapy: Secondary | ICD-10-CM | POA: Insufficient documentation

## 2012-07-19 DIAGNOSIS — Z7982 Long term (current) use of aspirin: Secondary | ICD-10-CM | POA: Insufficient documentation

## 2012-07-19 DIAGNOSIS — M109 Gout, unspecified: Secondary | ICD-10-CM | POA: Insufficient documentation

## 2012-07-19 DIAGNOSIS — I10 Essential (primary) hypertension: Secondary | ICD-10-CM | POA: Insufficient documentation

## 2012-07-19 DIAGNOSIS — E7800 Pure hypercholesterolemia, unspecified: Secondary | ICD-10-CM | POA: Insufficient documentation

## 2012-07-19 IMAGING — CR DG CHEST 2V
1 series · 1 of 1 positions shown · non-contrast
Comparison: 08/22/2011

CLINICAL DATA: Cough.  Lymphoma.  Gout.

CHEST - 2 VIEW

[view not recorded]
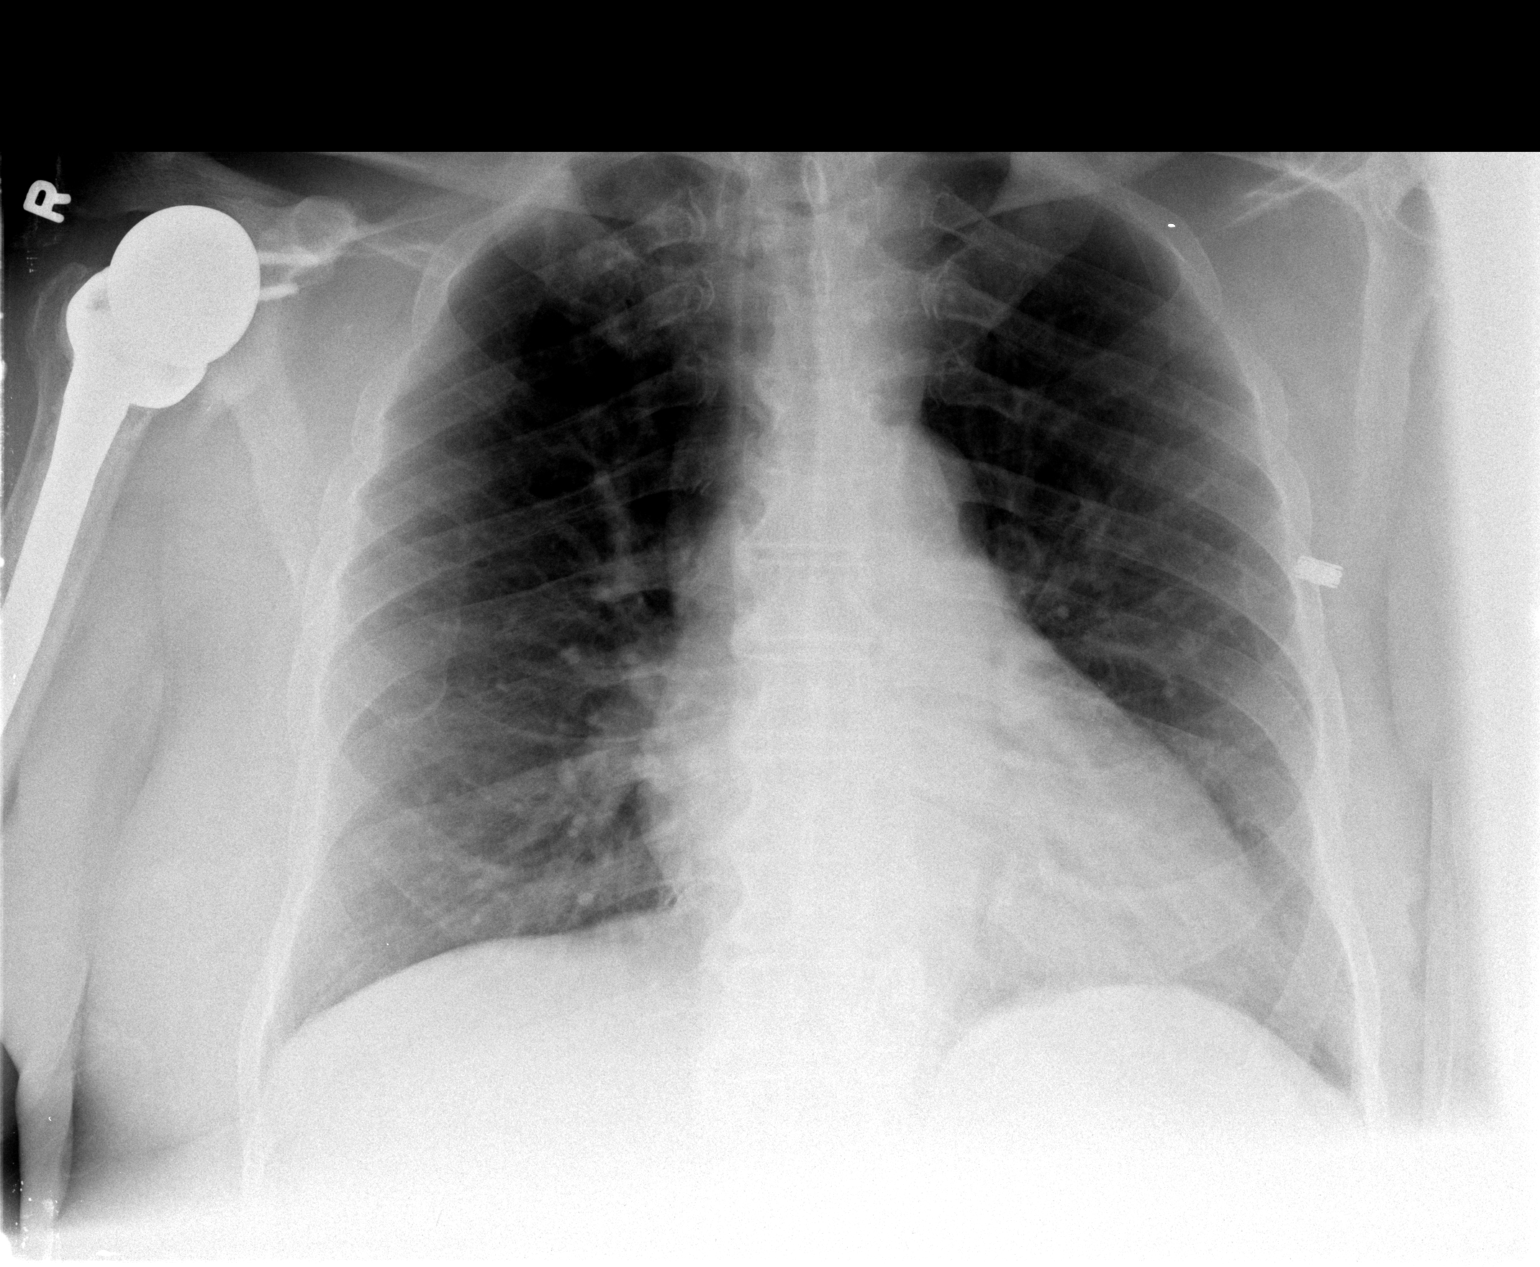

[1 of 1 positions shown; findings below may reference images not displayed]

FINDINGS: Heart size is stable and at the upper limits of normal in
size.  Both lungs are clear.  No evidence of pleural effusion.  No
mass or lymphadenopathy identified.  Right shoulder prosthesis
again noted.
IMPRESSION: Stable exam.  No active cardiopulmonary disease.

## 2012-07-19 MED ORDER — ACETAMINOPHEN 325 MG PO TABS
650.0000 mg | ORAL_TABLET | Freq: Once | ORAL | Status: AC
  Administered 2012-07-19: 650 mg via ORAL
  Filled 2012-07-19: qty 2

## 2012-07-19 NOTE — ED Provider Notes (Signed)
History   This chart was scribed for Loren Racer, MD by Gerlean Ren, ED Scribe. This patient was seen in room TR11C/TR11C and the patient's care was started at 5:06 PM    CSN: [id number]  Arrival date & time 07/19/12  1552   First MD Initiated Contact with Patient 07/19/12 1655      Chief Complaint  Patient presents with  . Leg Swelling     The history is provided by the patient. No language interpreter was used.   [name] is a 78 y.o. male who presents to the Emergency Department complaining of 3-4 days of bilateral lower extremity swelling that is improved when legs are elevated and worsened when pt spends extended periods of time standing or ambulating.  Pt reports pain associated with swelling for which he takes hydrocodone at home.  Pt takes 20mg  lasix daily, and 40mg  lasix when swelling worsens.  Pt has support hose but states that it worsens swelling in bilateral thighs.  Pt has h/o HTN, DM, and nephrolithiasis.  Pt denies tobacco and alcohol use.   Past Medical History  Diagnosis Date  . Pneumonia   . Lymphoma     s/p partial gastrectomy  . Hypertension   . Hypercholesteremia   . Diabetes mellitus   . Gout   . Irregular heart beat   . Kidney stone     Past Surgical History  Procedure Date  . Partial gastrectomy 1994    for lymphoma  . Hernia repair   . Joint replacement   . Back surgery     History reviewed. No pertinent family history.  History  Substance Use Topics  . Smoking status: Never Smoker   . Smokeless tobacco: Never Used  . Alcohol Use: No      Review of Systems  Respiratory: Negative for chest tightness and shortness of breath.   Cardiovascular: Positive for leg swelling. Negative for chest pain.    Allergies  Review of patient's allergies indicates no known allergies.  Home Medications   Current Outpatient Rx  Name  Route  Sig  Dispense  Refill  . ACETAMINOPHEN 325 MG PO TABS   Oral   Take 325 mg by mouth every 6  (six) hours as needed. For pain         . ALLOPURINOL 100 MG PO TABS   Oral   Take 100 mg by mouth daily.           . ASPIRIN EC 81 MG PO TBEC   Oral   Take 81 mg by mouth daily.         Marland Kitchen BISACODYL 5 MG PO TBEC   Oral   Take 5 mg by mouth daily as needed. For constipation         . CARVEDILOL 12.5 MG PO TABS   Oral   Take 12.5 mg by mouth 2 (two) times daily with a meal.          . VITAMIN B 12 PO   Oral   Take 1 tablet by mouth every morning.          . CYCLOBENZAPRINE HCL 10 MG PO TABS   Oral   Take 10 mg by mouth 3 (three) times daily as needed. For muscle spasms         . ERGOCALCIFEROL 50000 UNITS PO CAPS   Oral   Take 50,000 Units by mouth every 14 (fourteen) days.         . FUROSEMIDE  20 MG PO TABS   Oral   Take 20 mg by mouth daily as needed. For fluid retention         . GLIPIZIDE 10 MG PO TABS   Oral   Take 10 mg by mouth daily.           Marland Kitchen LISINOPRIL 20 MG PO TABS   Oral   Take 20 mg by mouth daily.           Marland Kitchen NIACIN 500 MG PO TABS   Oral   Take 500 mg by mouth at bedtime.          . OXYCODONE-ACETAMINOPHEN 5-325 MG PO TABS   Oral   Take 1-2 tablets by mouth every 6 (six) hours as needed. For pain ( starting on 04/18/12 and ending on 04/23/12)         . PANTOPRAZOLE SODIUM 40 MG PO TBEC   Oral   Take 40 mg by mouth daily.           Marland Kitchen PIOGLITAZONE HCL 30 MG PO TABS   Oral   Take 15 mg by mouth daily.          Marland Kitchen POLYETHYLENE GLYCOL 3350 PO PACK   Oral   Take 17 g by mouth daily.         Marland Kitchen RANITIDINE HCL 150 MG PO CAPS   Oral   Take 150 mg by mouth 2 (two) times daily.           . SENNOSIDES 8.6 MG PO TABS   Oral   Take 2 tablets by mouth 2 (two) times daily.          . SERTRALINE HCL 100 MG PO TABS   Oral   Take 200 mg by mouth daily.           Marland Kitchen SIMVASTATIN 10 MG PO TABS   Oral   Take 10 mg by mouth at bedtime.             BP 153/80  Pulse 60  Temp 98.2 F (36.8 C) (Oral)  Resp 16   SpO2 100%  Physical Exam  Nursing note and vitals reviewed. Constitutional: He is oriented to person, place, and time. He appears well-developed and well-nourished.  HENT:  Head: Normocephalic and atraumatic.  Mouth/Throat: Oropharynx is clear and moist.  Eyes: Conjunctivae normal and EOM are normal. Pupils are equal, round, and reactive to light.  Neck: Normal range of motion. Neck supple.  Cardiovascular: Normal rate, regular rhythm and normal heart sounds.   Pulmonary/Chest: Effort normal and breath sounds normal.  Abdominal: Soft. Bowel sounds are normal. There is no tenderness.  Musculoskeletal: Normal range of motion. He exhibits edema.       1+ bilateral pitting edema below knee.    Neurological: He is alert and oriented to person, place, and time.  Skin: Skin is warm and dry.  Psychiatric: He has a normal mood and affect.    ED Course  Procedures (including critical care time) DIAGNOSTIC STUDIES: Oxygen Saturation is 100% on room air, normal by my interpretation.    COORDINATION OF CARE: 5:11 PM- Patient informed of clinical course, understands medical decision-making process, and agrees with plan.  Discussed increasing to 40mg  lasix daily until swelling returns to baseline.  Informed pt he will not receive narcotic pain medication here.      Labs Reviewed - No data to display No results found.   1. Peripheral edema  MDM  I personally performed the services described in this documentation, which was scribed in my presence. The recorded information has been reviewed and is accurate.     Loren Racer, MD 07/19/12 [phone]

## 2012-07-19 NOTE — ED Notes (Signed)
Presents with bilateral lower extremity swelling for 3-4 days. Pt states when he props them up they go down, but when on them too long they get swollen. Edema is 1+ pitting. Rates bilateral leg pain at 8. States he need a knee replacement but is unable to get it yet. Has support hose but does not like to wear them.

## 2012-07-19 NOTE — ED Notes (Signed)
Called for pt x 1 with no answer 

## 2012-07-26 ENCOUNTER — Ambulatory Visit (HOSPITAL_COMMUNITY)
Admission: RE | Admit: 2012-07-26 | Discharge: 2012-07-26 | Disposition: A | Discharge status: Home / Self Care | Payer: Medicare Other | Source: Ambulatory Visit | Attending: Podiatry | Admitting: Podiatry

## 2012-07-26 ENCOUNTER — Other Ambulatory Visit (HOSPITAL_COMMUNITY): Payer: Self-pay | Admitting: Podiatry

## 2012-07-26 DIAGNOSIS — B999 Unspecified infectious disease: Secondary | ICD-10-CM

## 2012-08-02 ENCOUNTER — Other Ambulatory Visit: Payer: Self-pay | Admitting: *Deleted

## 2012-08-02 DIAGNOSIS — L97509 Non-pressure chronic ulcer of other part of unspecified foot with unspecified severity: Secondary | ICD-10-CM

## 2012-08-05 ENCOUNTER — Emergency Department (HOSPITAL_COMMUNITY): Payer: Medicare Other

## 2012-08-05 ENCOUNTER — Encounter (HOSPITAL_COMMUNITY): Payer: Self-pay | Admitting: Emergency Medicine

## 2012-08-05 ENCOUNTER — Emergency Department (HOSPITAL_COMMUNITY)
Admission: EM | Admit: 2012-08-05 | Discharge: 2012-08-05 | Disposition: A | Discharge status: Home / Self Care | Payer: Medicare Other | Attending: Emergency Medicine | Admitting: Emergency Medicine

## 2012-08-05 DIAGNOSIS — M1990 Unspecified osteoarthritis, unspecified site: Secondary | ICD-10-CM

## 2012-08-05 DIAGNOSIS — L03039 Cellulitis of unspecified toe: Secondary | ICD-10-CM | POA: Insufficient documentation

## 2012-08-05 DIAGNOSIS — Z87442 Personal history of urinary calculi: Secondary | ICD-10-CM | POA: Insufficient documentation

## 2012-08-05 DIAGNOSIS — I10 Essential (primary) hypertension: Secondary | ICD-10-CM | POA: Insufficient documentation

## 2012-08-05 DIAGNOSIS — E7800 Pure hypercholesterolemia, unspecified: Secondary | ICD-10-CM | POA: Insufficient documentation

## 2012-08-05 DIAGNOSIS — Z7982 Long term (current) use of aspirin: Secondary | ICD-10-CM | POA: Insufficient documentation

## 2012-08-05 DIAGNOSIS — L02619 Cutaneous abscess of unspecified foot: Secondary | ICD-10-CM | POA: Insufficient documentation

## 2012-08-05 DIAGNOSIS — M129 Arthropathy, unspecified: Secondary | ICD-10-CM | POA: Insufficient documentation

## 2012-08-05 DIAGNOSIS — E119 Type 2 diabetes mellitus without complications: Secondary | ICD-10-CM | POA: Insufficient documentation

## 2012-08-05 DIAGNOSIS — Z79899 Other long term (current) drug therapy: Secondary | ICD-10-CM | POA: Insufficient documentation

## 2012-08-05 DIAGNOSIS — I499 Cardiac arrhythmia, unspecified: Secondary | ICD-10-CM | POA: Insufficient documentation

## 2012-08-05 DIAGNOSIS — M109 Gout, unspecified: Secondary | ICD-10-CM | POA: Insufficient documentation

## 2012-08-05 DIAGNOSIS — Z8701 Personal history of pneumonia (recurrent): Secondary | ICD-10-CM | POA: Insufficient documentation

## 2012-08-05 LAB — BASIC METABOLIC PANEL
CO2: 24 mEq/L (ref 19–32)
Chloride: 103 mEq/L (ref 96–112)
Creatinine, Ser: 1.98 mg/dL — ABNORMAL HIGH (ref 0.50–1.35)
GFR calc Af Amer: 35 mL/min — ABNORMAL LOW (ref >90)
Potassium: 3.9 mEq/L (ref 3.5–5.1)

## 2012-08-05 LAB — CBC WITH DIFFERENTIAL/PLATELET
Basophils Absolute: 0 10*3/uL (ref 0.0–0.1)
Basophils Relative: 0 % (ref 0–1)
HCT: 33 % — ABNORMAL LOW (ref 39.0–52.0)
Hemoglobin: 11.3 g/dL — ABNORMAL LOW (ref 13.0–17.0)
Lymphocytes Relative: 36 % (ref 12–46)
Monocytes Absolute: 0.5 10*3/uL (ref 0.1–1.0)
Neutro Abs: 2.2 10*3/uL (ref 1.7–7.7)
Neutrophils Relative %: 49 % (ref 43–77)
RDW: 13.5 % (ref 11.5–15.5)
WBC: 4.6 10*3/uL (ref 4.0–10.5)

## 2012-08-05 LAB — GLUCOSE, CAPILLARY: Glucose-Capillary: 84 mg/dL (ref 70–99)

## 2012-08-05 LAB — SEDIMENTATION RATE: Sed Rate: 20 mm/hr — ABNORMAL HIGH (ref 0–16)

## 2012-08-05 LAB — C-REACTIVE PROTEIN: CRP: <0.5 mg/dL — ABNORMAL LOW (ref <0.60)

## 2012-08-05 MED ORDER — HYDROCODONE-ACETAMINOPHEN 5-325 MG PO TABS
2.0000 | ORAL_TABLET | Freq: Once | ORAL | Status: AC
  Administered 2012-08-05: 2 via ORAL
  Filled 2012-08-05: qty 2

## 2012-08-05 MED ORDER — HYDROCODONE-ACETAMINOPHEN 5-325 MG PO TABS
2.0000 | ORAL_TABLET | Freq: Once | ORAL | Status: AC
  Administered 2012-08-05: 2 via ORAL
  Filled 2012-08-05: qty 1
  Filled 2012-08-05: qty 2

## 2012-08-05 NOTE — ED Notes (Signed)
Pt states that he has been having pain in his rt second toe for about 3wks. Pt states he went to his primary physician and they prescribed him cefuroxime 500mg  for infection. Today pt c/o intermittent shooting pain in the rt second toe.

## 2012-08-05 NOTE — ED Provider Notes (Signed)
History     CSN: [id number]  Arrival date & time 08/05/12  0727   First MD Initiated Contact with Patient 08/05/12 [phone]      Chief Complaint  Patient presents with  . Toe Pain    (Consider location/radiation/quality/duration/timing/severity/associated sxs/prior treatment) HPI Comments: PT with hx of diabetes comes in with toe pain. Pt states that his pain started few weeks ago, and is constant pain, throbbing, sharp, sometimes radiating. He has no n/v/f/c. Pt is on cephalosporin currently. He has not noticed any increased swelling, or redness. No podiatrist seen recently. Has PCP follow up on Monday.  Patient is a 78 y.o. male presenting with toe pain. The history is provided by the patient and medical records.  Toe Pain Pertinent negatives include no chest pain, no abdominal pain and no shortness of breath.    Past Medical History  Diagnosis Date  . Pneumonia   . Lymphoma     s/p partial gastrectomy  . Hypertension   . Hypercholesteremia   . Diabetes mellitus   . Gout   . Irregular heart beat   . Kidney stone     Past Surgical History  Procedure Date  . Partial gastrectomy 1994    for lymphoma  . Hernia repair   . Joint replacement   . Back surgery     History reviewed. No pertinent family history.  History  Substance Use Topics  . Smoking status: Never Smoker   . Smokeless tobacco: Never Used  . Alcohol Use: No      Review of Systems  Constitutional: Negative for activity change and appetite change.  Respiratory: Negative for cough and shortness of breath.   Cardiovascular: Negative for chest pain.  Gastrointestinal: Negative for abdominal pain.  Genitourinary: Negative for dysuria.  Musculoskeletal: Positive for myalgias, joint swelling and arthralgias.    Allergies  Review of patient's allergies indicates no known allergies.  Home Medications   Current Outpatient Rx  Name  Route  Sig  Dispense  Refill  . ACETAMINOPHEN 325 MG PO TABS    Oral   Take 325 mg by mouth every 6 (six) hours as needed. For pain         . ALLOPURINOL 100 MG PO TABS   Oral   Take 100 mg by mouth daily.           . ASPIRIN EC 81 MG PO TBEC   Oral   Take 81 mg by mouth daily.         Marland Kitchen BISACODYL 5 MG PO TBEC   Oral   Take 5 mg by mouth daily as needed. For constipation         . CARVEDILOL 12.5 MG PO TABS   Oral   Take 12.5 mg by mouth 2 (two) times daily with a meal.          . VITAMIN B 12 PO   Oral   Take 1 tablet by mouth every morning.          . CYCLOBENZAPRINE HCL 10 MG PO TABS   Oral   Take 10 mg by mouth 3 (three) times daily as needed. For muscle spasms         . ERGOCALCIFEROL 50000 UNITS PO CAPS   Oral   Take 50,000 Units by mouth every 14 (fourteen) days.         . FUROSEMIDE 20 MG PO TABS   Oral   Take 20 mg by mouth daily as needed. For  fluid retention         . GLIPIZIDE 10 MG PO TABS   Oral   Take 10 mg by mouth daily.           Marland Kitchen LISINOPRIL 20 MG PO TABS   Oral   Take 20 mg by mouth daily.           Marland Kitchen NIACIN 500 MG PO TABS   Oral   Take 500 mg by mouth at bedtime.          . OXYCODONE-ACETAMINOPHEN 5-325 MG PO TABS   Oral   Take 1-2 tablets by mouth every 6 (six) hours as needed. For pain ( starting on 04/18/12 and ending on 04/23/12)         . PANTOPRAZOLE SODIUM 40 MG PO TBEC   Oral   Take 40 mg by mouth daily.           Marland Kitchen PIOGLITAZONE HCL 30 MG PO TABS   Oral   Take 15 mg by mouth daily.          Marland Kitchen POLYETHYLENE GLYCOL 3350 PO PACK   Oral   Take 17 g by mouth daily.         Marland Kitchen RANITIDINE HCL 150 MG PO CAPS   Oral   Take 150 mg by mouth 2 (two) times daily.           . SENNOSIDES 8.6 MG PO TABS   Oral   Take 2 tablets by mouth 2 (two) times daily.          . SERTRALINE HCL 100 MG PO TABS   Oral   Take 200 mg by mouth daily.           Marland Kitchen SIMVASTATIN 10 MG PO TABS   Oral   Take 10 mg by mouth at bedtime.             BP 167/81  Pulse 66  Temp  97.7 F (36.5 C) (Oral)  Resp 18  SpO2 99%  Physical Exam  Nursing note and vitals reviewed. Constitutional: He is oriented to person, place, and time. He appears well-developed.  HENT:  Head: Normocephalic and atraumatic.  Eyes: Conjunctivae normal and EOM are normal. Pupils are equal, round, and reactive to light.  Neck: Normal range of motion. Neck supple.  Cardiovascular: Normal rate and regular rhythm.   Pulmonary/Chest: Effort normal and breath sounds normal.  Abdominal: Soft. Bowel sounds are normal. He exhibits no distension. There is no tenderness. There is no rebound and no guarding.  Musculoskeletal:       Left great toe is deformed, no tenderness to palpation. Left 2nd toe - there is a healing ulcer on the dorsal aspect, and some swelling. Able to move over the ip joint, tenderness with palpation, slight callot, no skin discoloration when compared to the other toes.  Neurological: He is alert and oriented to person, place, and time.  Skin: Skin is warm.    ED Course  Procedures (including critical care time)  Labs Reviewed - No data to display No results found.   No diagnosis found.    MDM  Pt comes in with cc of toe pain. Is on cephalosporin for treatment, and has f/u. Vitals are stable. DDx will include Osteomyelitis, due to hx of DM. 2+ DP noted, and is reassuring.  Pt's sx going on for 3 weeks - so xray might have some findings. We will also get cbc and sed rate. PCP f/u  on Monday is reassuring, as we have someone who can f/u the wound, consult or get MRI.  Derwood Kaplan, MD 08/05/12 1004

## 2012-08-05 NOTE — ED Notes (Signed)
Pt asked to have cbg checked states he did not take any of his medications or eat prior to coming to the ER. CBG 84.

## 2012-08-05 NOTE — ED Notes (Signed)
Patient transported to X-ray 

## 2012-08-05 NOTE — ED Notes (Signed)
Pt back from X-ray.  

## 2012-08-05 NOTE — ED Notes (Signed)
MD at bedside. 

## 2012-08-17 ENCOUNTER — Encounter: Payer: Non-veteran care | Admitting: Vascular Surgery

## 2012-08-21 ENCOUNTER — Encounter (HOSPITAL_COMMUNITY): Payer: Self-pay | Admitting: *Deleted

## 2012-08-21 ENCOUNTER — Emergency Department (HOSPITAL_COMMUNITY)
Admission: EM | Admit: 2012-08-21 | Discharge: 2012-08-22 | Disposition: A | Discharge status: Home / Self Care | Payer: Medicare Other | Attending: Emergency Medicine | Admitting: Emergency Medicine

## 2012-08-21 DIAGNOSIS — Z8719 Personal history of other diseases of the digestive system: Secondary | ICD-10-CM | POA: Insufficient documentation

## 2012-08-21 DIAGNOSIS — M129 Arthropathy, unspecified: Secondary | ICD-10-CM | POA: Insufficient documentation

## 2012-08-21 DIAGNOSIS — M1990 Unspecified osteoarthritis, unspecified site: Secondary | ICD-10-CM

## 2012-08-21 DIAGNOSIS — E119 Type 2 diabetes mellitus without complications: Secondary | ICD-10-CM | POA: Insufficient documentation

## 2012-08-21 DIAGNOSIS — N189 Chronic kidney disease, unspecified: Secondary | ICD-10-CM | POA: Insufficient documentation

## 2012-08-21 DIAGNOSIS — I129 Hypertensive chronic kidney disease with stage 1 through stage 4 chronic kidney disease, or unspecified chronic kidney disease: Secondary | ICD-10-CM | POA: Insufficient documentation

## 2012-08-21 DIAGNOSIS — G8929 Other chronic pain: Secondary | ICD-10-CM | POA: Insufficient documentation

## 2012-08-21 DIAGNOSIS — E7800 Pure hypercholesterolemia, unspecified: Secondary | ICD-10-CM | POA: Insufficient documentation

## 2012-08-21 DIAGNOSIS — Z79899 Other long term (current) drug therapy: Secondary | ICD-10-CM | POA: Insufficient documentation

## 2012-08-21 DIAGNOSIS — Z8701 Personal history of pneumonia (recurrent): Secondary | ICD-10-CM | POA: Insufficient documentation

## 2012-08-21 DIAGNOSIS — M109 Gout, unspecified: Secondary | ICD-10-CM | POA: Insufficient documentation

## 2012-08-21 DIAGNOSIS — Z87442 Personal history of urinary calculi: Secondary | ICD-10-CM | POA: Insufficient documentation

## 2012-08-21 DIAGNOSIS — M542 Cervicalgia: Secondary | ICD-10-CM | POA: Insufficient documentation

## 2012-08-21 DIAGNOSIS — R197 Diarrhea, unspecified: Secondary | ICD-10-CM | POA: Insufficient documentation

## 2012-08-21 DIAGNOSIS — Z8679 Personal history of other diseases of the circulatory system: Secondary | ICD-10-CM | POA: Insufficient documentation

## 2012-08-21 LAB — COMPREHENSIVE METABOLIC PANEL
AST: 26 U/L (ref 0–37)
Albumin: 3.4 g/dL — ABNORMAL LOW (ref 3.5–5.2)
Alkaline Phosphatase: 112 U/L (ref 39–117)
BUN: 28 mg/dL — ABNORMAL HIGH (ref 6–23)
Chloride: 104 mEq/L (ref 96–112)
Potassium: 3.5 mEq/L (ref 3.5–5.1)
Sodium: 137 mEq/L (ref 135–145)
Total Bilirubin: 0.3 mg/dL (ref 0.3–1.2)
Total Protein: 6.5 g/dL (ref 6.0–8.3)

## 2012-08-21 LAB — CBC WITH DIFFERENTIAL/PLATELET
Basophils Absolute: 0 10*3/uL (ref 0.0–0.1)
Basophils Relative: 0 % (ref 0–1)
Eosinophils Absolute: 0.2 10*3/uL (ref 0.0–0.7)
Hemoglobin: 10.9 g/dL — ABNORMAL LOW (ref 13.0–17.0)
MCH: 31.1 pg (ref 26.0–34.0)
MCHC: 33.1 g/dL (ref 30.0–36.0)
Monocytes Relative: 10 % (ref 3–12)
Neutro Abs: 2.1 10*3/uL (ref 1.7–7.7)
Neutrophils Relative %: 46 % (ref 43–77)
Platelets: 182 10*3/uL (ref 150–400)
RDW: 13.4 % (ref 11.5–15.5)

## 2012-08-21 LAB — GLUCOSE, CAPILLARY
Glucose-Capillary: 94 mg/dL (ref 70–99)
Glucose-Capillary: 86 mg/dL (ref 70–99)

## 2012-08-21 MED ORDER — SODIUM CHLORIDE 0.9 % IV SOLN: 1000.0000 mL | INTRAVENOUS | Status: DC

## 2012-08-21 MED ORDER — SODIUM CHLORIDE 0.9 % IV SOLN: 1000.0000 mL | Freq: Once | INTRAVENOUS | Status: DC

## 2012-08-21 NOTE — ED Notes (Addendum)
Pt in nad, c/o right sided neck and knee pain over a week. Pt reports he has been put on antibiotics also sts he has been having diarrhea. Pt has delayed responses. Pt A&O x 4. No facial droop, equal hand gribs, pt denies numbness/tingling.

## 2012-08-21 NOTE — ED Notes (Signed)
Pt unable to give stool sample

## 2012-08-21 NOTE — ED Notes (Signed)
Pt ambulated to restroom. 

## 2012-08-21 NOTE — ED Notes (Signed)
He is c/o of diarrhea for several days.  The pt cannot appear to give me much of a history.  He is also c/o rt neck pain with nausea.  [facility] places.

## 2012-08-22 MED ORDER — HYDROCODONE-ACETAMINOPHEN 5-325 MG PO TABS
2.0000 | ORAL_TABLET | Freq: Once | ORAL | Status: AC
  Administered 2012-08-22: 2 via ORAL
  Filled 2012-08-22: qty 2

## 2012-08-22 NOTE — ED Notes (Signed)
Pt given orange juice and crackers

## 2012-08-23 NOTE — ED Provider Notes (Signed)
History     CSN: [id number]  Arrival date & time 08/21/12  1909   First MD Initiated Contact with Patient 08/21/12 2209      Chief Complaint  Patient presents with  . Diarrhea    (Consider location/radiation/quality/duration/timing/severity/associated sxs/prior treatment) HPI 78 year old male presents emergency department with complaint of diarrhea. Patient is a poor historian, and is difficult to understand due to his mumbling. Patient has been [facility] by the emergency department, [facility]. He has been placed on antibiotics. Since starting the antibiotics, he has had several loose stools. He denies any fever or chills. No abdominal pain. Patient has not been able to produce a stool sample since being in the emergency department. He is eating and drinking well. No nausea or vomiting. Patient reports chronic right neck pain, and has run out of his pain medications. He denies any other issues at this time  Past Medical History  Diagnosis Date  . Pneumonia   . Lymphoma     s/p partial gastrectomy  . Hypertension   . Hypercholesteremia   . Diabetes mellitus   . Gout   . Irregular heart beat   . Kidney stone     Past Surgical History  Procedure Date  . Partial gastrectomy 1994    for lymphoma  . Hernia repair   . Joint replacement   . Back surgery     No family history on file.  History  Substance Use Topics  . Smoking status: Never Smoker   . Smokeless tobacco: Never Used  . Alcohol Use: No      Review of Systems  Unable to perform ROS: Other   language barrier  Allergies  Review of patient's allergies indicates no known allergies.  Home Medications   Current Outpatient Rx  Name  Route  Sig  Dispense  Refill  . ACETAMINOPHEN 325 MG PO TABS   Oral   Take 325 mg by mouth every 6 (six) hours as needed. For pain         . ALLOPURINOL 100 MG PO TABS   Oral   Take 100 mg by mouth daily.           . ASPIRIN EC 81 MG  PO TBEC   Oral   Take 81 mg by mouth daily.         Marland Kitchen BISACODYL 5 MG PO TBEC   Oral   Take 5 mg by mouth daily as needed. For constipation         . CARVEDILOL 12.5 MG PO TABS   Oral   Take 12.5 mg by mouth 2 (two) times daily with a meal.          . VITAMIN B 12 PO   Oral   Take 1 tablet by mouth every morning.          . CYCLOBENZAPRINE HCL 10 MG PO TABS   Oral   Take 10 mg by mouth 3 (three) times daily as needed. For muscle spasms         . ERGOCALCIFEROL 50000 UNITS PO CAPS   Oral   Take 50,000 Units by mouth every 14 (fourteen) days.         . FUROSEMIDE 20 MG PO TABS   Oral   Take 20 mg by mouth daily.          Marland Kitchen GLIPIZIDE 10 MG PO TABS   Oral   Take 10 mg by mouth  daily.           Marland Kitchen HYDROCODONE-ACETAMINOPHEN 5-325 MG PO TABS   Oral   Take 1 tablet by mouth every 6 (six) hours as needed. For pain         . LISINOPRIL 20 MG PO TABS   Oral   Take 20 mg by mouth daily.           Marland Kitchen NIACIN 500 MG PO TABS   Oral   Take 500 mg by mouth at bedtime.          Marland Kitchen PANTOPRAZOLE SODIUM 40 MG PO TBEC   Oral   Take 40 mg by mouth daily.           Marland Kitchen PIOGLITAZONE HCL 30 MG PO TABS   Oral   Take 15 mg by mouth daily.          Marland Kitchen POLYETHYLENE GLYCOL 3350 PO PACK   Oral   Take 17 g by mouth daily.         Marland Kitchen RANITIDINE HCL 150 MG PO CAPS   Oral   Take 150 mg by mouth 2 (two) times daily.           . SENNOSIDES 8.6 MG PO TABS   Oral   Take 2 tablets by mouth 2 (two) times daily.          . SERTRALINE HCL 100 MG PO TABS   Oral   Take 200 mg by mouth daily.           Marland Kitchen SIMVASTATIN 10 MG PO TABS   Oral   Take 10 mg by mouth at bedtime.             BP 156/84  Pulse 57  Temp 97.9 F (36.6 C) (Oral)  Resp 14  SpO2 100%  Physical Exam  Nursing note and vitals reviewed. Constitutional: He is oriented to person, place, and time. He appears well-developed and well-nourished. No distress.  HENT:  Head: Normocephalic and  atraumatic.  Nose: Nose normal.  Mouth/Throat: Oropharynx is clear and moist. No oropharyngeal exudate.  Neck: Normal range of motion. Neck supple. No JVD present. No tracheal deviation present. No thyromegaly present.  Cardiovascular: Normal rate, regular rhythm, normal heart sounds and intact distal pulses.  Exam reveals no gallop and no friction rub.   No murmur heard. Pulmonary/Chest: Effort normal and breath sounds normal. No stridor. No respiratory distress. He has no wheezes. He has no rales. He exhibits no tenderness.  Abdominal:       Mildly hyperactive bowel sounds  Musculoskeletal: Normal range of motion. He exhibits no edema.  Lymphadenopathy:    He has no cervical adenopathy.  Neurological: He is oriented to person, place, and time. He displays normal reflexes. He exhibits abnormal muscle tone. Coordination normal.  Skin: Skin is warm and dry. No rash noted. No erythema. No pallor.    ED Course  Procedures (including critical care time)  Labs Reviewed  CBC WITH DIFFERENTIAL - Abnormal; Notable for the following:    RBC 3.51 (*)     Hemoglobin 10.9 (*)     HCT 32.9 (*)     All other components within normal limits  COMPREHENSIVE METABOLIC PANEL - Abnormal; Notable for the following:    BUN 28 (*)     Creatinine, Ser 1.68 (*)     Albumin 3.4 (*)     GFR calc non Af Amer 37 (*)     GFR calc Af Denyse Dago  43 (*)     All other components within normal limits  GLUCOSE, CAPILLARY  GLUCOSE, CAPILLARY  LAB REPORT - SCANNED  CLOSTRIDIUM DIFFICILE BY PCR   No results found.   1. Diarrhea   2. Arthritis   3. CKD (chronic kidney disease)       MDM  78 year old male with diarrhea after recent antibiotic use. Patient has had no further diarrhea to test for C. difficile. He is eating well here in the emergency department. Patient reports he has some chronic arthritis in his neck but no other complaints. Will have her follow back up at the [place].        Olivia Mackie,  MD 08/23/12 [phone]

## 2012-08-26 ENCOUNTER — Encounter (HOSPITAL_COMMUNITY): Payer: Self-pay | Admitting: Emergency Medicine

## 2012-08-26 ENCOUNTER — Emergency Department (HOSPITAL_COMMUNITY)
Admission: EM | Admit: 2012-08-26 | Discharge: 2012-08-26 | Disposition: A | Discharge status: Home / Self Care | Payer: Medicare Other | Attending: Emergency Medicine | Admitting: Emergency Medicine

## 2012-08-26 DIAGNOSIS — Z7982 Long term (current) use of aspirin: Secondary | ICD-10-CM | POA: Insufficient documentation

## 2012-08-26 DIAGNOSIS — M109 Gout, unspecified: Secondary | ICD-10-CM | POA: Insufficient documentation

## 2012-08-26 DIAGNOSIS — M79609 Pain in unspecified limb: Secondary | ICD-10-CM

## 2012-08-26 DIAGNOSIS — Z8701 Personal history of pneumonia (recurrent): Secondary | ICD-10-CM | POA: Insufficient documentation

## 2012-08-26 DIAGNOSIS — E119 Type 2 diabetes mellitus without complications: Secondary | ICD-10-CM | POA: Insufficient documentation

## 2012-08-26 DIAGNOSIS — Z79899 Other long term (current) drug therapy: Secondary | ICD-10-CM | POA: Insufficient documentation

## 2012-08-26 DIAGNOSIS — M7989 Other specified soft tissue disorders: Secondary | ICD-10-CM | POA: Insufficient documentation

## 2012-08-26 DIAGNOSIS — Z8679 Personal history of other diseases of the circulatory system: Secondary | ICD-10-CM | POA: Insufficient documentation

## 2012-08-26 DIAGNOSIS — I10 Essential (primary) hypertension: Secondary | ICD-10-CM | POA: Insufficient documentation

## 2012-08-26 DIAGNOSIS — Z87442 Personal history of urinary calculi: Secondary | ICD-10-CM | POA: Insufficient documentation

## 2012-08-26 DIAGNOSIS — E7800 Pure hypercholesterolemia, unspecified: Secondary | ICD-10-CM | POA: Insufficient documentation

## 2012-08-26 LAB — BASIC METABOLIC PANEL
BUN: 24 mg/dL — ABNORMAL HIGH (ref 6–23)
CO2: 22 mEq/L (ref 19–32)
Calcium: 9.5 mg/dL (ref 8.4–10.5)
Chloride: 105 mEq/L (ref 96–112)
Creatinine, Ser: 1.78 mg/dL — ABNORMAL HIGH (ref 0.50–1.35)

## 2012-08-26 LAB — CBC WITH DIFFERENTIAL/PLATELET
Basophils Relative: 0 % (ref 0–1)
Eosinophils Relative: 4 % (ref 0–5)
HCT: 33.1 % — ABNORMAL LOW (ref 39.0–52.0)
Hemoglobin: 11.1 g/dL — ABNORMAL LOW (ref 13.0–17.0)
Lymphocytes Relative: 38 % (ref 12–46)
MCHC: 33.5 g/dL (ref 30.0–36.0)
MCV: 92.2 fL (ref 78.0–100.0)
Monocytes Absolute: 0.4 10*3/uL (ref 0.1–1.0)
Monocytes Relative: 10 % (ref 3–12)
Neutro Abs: 2.1 10*3/uL (ref 1.7–7.7)

## 2012-08-26 LAB — GLUCOSE, CAPILLARY

## 2012-08-26 NOTE — Progress Notes (Signed)
Right:  No evidence of DVT or superficial thrombosis.  There appears to be a Baker's cyst.  Left:  Negative for DVT in the common femoral vein.  

## 2012-08-26 NOTE — ED Notes (Signed)
Pt says right knee hurts and cannot replace due to dropped foot and arthritis. Pt says she wears special brace and support hose but it is very painful. Pt is seen at [facility] and called RN who instructed him to wear hose. Pt has not taken OTC pain medicine.

## 2012-08-26 NOTE — ED Provider Notes (Signed)
History  This chart was scribed for Roy Racer, MD by Bennett Scrape, ED Scribe. This patient was seen in room TR05C/TR05C and the patient's care was started at 2:32 PM.   CSN: [id number]  Arrival date & time 08/26/12  1401   First MD Initiated Contact with Patient 08/26/12 1432      Chief Complaint  Patient presents with  . Leg Pain    Patient is a 78 y.o. male presenting with leg pain. The history is provided by the patient. No language interpreter was used.  Leg Pain  The incident occurred more than 2 days ago. The incident occurred at home. There was no injury mechanism. The pain is present in the right knee.    [name] is a 78 y.o. male who presents to the Emergency Department complaining of 3 days of gradual onset, gradually worsening, constant right knee pain described as burning with associated swelling. He has been wearing his special brace and support hose but the symptoms are not improving. He denies taking OTC medications at home to improve symptoms. He was seen in the ED 5 days ago for diarrhea and given IV fluids. He denies cough, fevers, CP, abdominal pain as associated symptoms. He has a h/o HTN, DM, and HLD. He denies smoking and alcohol use.   Past Medical History  Diagnosis Date  . Pneumonia   . Lymphoma     s/p partial gastrectomy  . Hypertension   . Hypercholesteremia   . Diabetes mellitus   . Gout   . Irregular heart beat   . Kidney stone     Past Surgical History  Procedure Date  . Partial gastrectomy 1994    for lymphoma  . Hernia repair   . Joint replacement   . Back surgery     No family history on file.  History  Substance Use Topics  . Smoking status: Never Smoker   . Smokeless tobacco: Never Used  . Alcohol Use: No      Review of Systems  Constitutional: Negative for fever and chills.  Gastrointestinal: Negative for nausea and vomiting.  Musculoskeletal: Positive for joint swelling (right knee).       Positive  for right knee pain  All other systems reviewed and are negative.    Allergies  Review of patient's allergies indicates no known allergies.  Home Medications   Current Outpatient Rx  Name  Route  Sig  Dispense  Refill  . ACETAMINOPHEN 325 MG PO TABS   Oral   Take 325 mg by mouth every 6 (six) hours as needed. For pain         . ALLOPURINOL 100 MG PO TABS   Oral   Take 100 mg by mouth daily.           . ASPIRIN EC 81 MG PO TBEC   Oral   Take 81 mg by mouth daily.         Marland Kitchen BISACODYL 5 MG PO TBEC   Oral   Take 5 mg by mouth daily as needed. For constipation         . CARVEDILOL 12.5 MG PO TABS   Oral   Take 12.5 mg by mouth 2 (two) times daily with a meal.          . VITAMIN B 12 PO   Oral   Take 1 tablet by mouth every morning.          . CYCLOBENZAPRINE HCL 10 MG PO  TABS   Oral   Take 10 mg by mouth 3 (three) times daily as needed. For muscle spasms         . ERGOCALCIFEROL 50000 UNITS PO CAPS   Oral   Take 50,000 Units by mouth every 14 (fourteen) days.         . FUROSEMIDE 20 MG PO TABS   Oral   Take 20 mg by mouth daily.          Marland Kitchen GLIPIZIDE 10 MG PO TABS   Oral   Take 10 mg by mouth daily.           Marland Kitchen HYDROCODONE-ACETAMINOPHEN 5-325 MG PO TABS   Oral   Take 1 tablet by mouth every 6 (six) hours as needed. For pain         . LISINOPRIL 20 MG PO TABS   Oral   Take 20 mg by mouth daily.           Marland Kitchen NIACIN 500 MG PO TABS   Oral   Take 500 mg by mouth at bedtime.          Marland Kitchen PANTOPRAZOLE SODIUM 40 MG PO TBEC   Oral   Take 40 mg by mouth daily.           Marland Kitchen PIOGLITAZONE HCL 30 MG PO TABS   Oral   Take 15 mg by mouth daily.          Marland Kitchen POLYETHYLENE GLYCOL 3350 PO PACK   Oral   Take 17 g by mouth daily.         . SENNOSIDES 8.6 MG PO TABS   Oral   Take 2 tablets by mouth 2 (two) times daily.          . SERTRALINE HCL 100 MG PO TABS   Oral   Take 200 mg by mouth daily.           Marland Kitchen SIMVASTATIN 10 MG PO  TABS   Oral   Take 10 mg by mouth at bedtime.             Triage Vitals: BP 138/76  Pulse 66  Temp 97.7 F (36.5 C) (Oral)  Resp 18  SpO2 100%  Physical Exam  Nursing note and vitals reviewed. Constitutional: He is oriented to person, place, and time. He appears well-developed and well-nourished. No distress.  HENT:  Head: Normocephalic and atraumatic.  Eyes: EOM are normal.  Neck: Neck supple. No tracheal deviation present.  Cardiovascular: Normal rate.   Pulmonary/Chest: Effort normal. No respiratory distress.  Musculoskeletal: Normal range of motion. He exhibits edema (3+ pitting edema on RLE).        effusion of the right knee, no tenderness to the right calf or right knee  Neurological: He is alert and oriented to person, place, and time.  Skin: Skin is warm and dry.  Psychiatric: He has a normal mood and affect. His behavior is normal.    ED Course  Procedures (including critical care time)  DIAGNOSTIC STUDIES: Oxygen Saturation is 100% on room air, normal by my interpretation.    COORDINATION OF CARE: 2:46 PM- Discussed treatment plan which includes CBC panel with pt at bedside and pt agreed to plan.  5:27 PM- Advised pt of lab work results. Discussed discharge plan with pt and pt agreed to plan. Also advised pt to follow up and pt agreed.  Labs Reviewed  CBC WITH DIFFERENTIAL - Abnormal; Notable for the following:  RBC 3.59 (*)     Hemoglobin 11.1 (*)     HCT 33.1 (*)     All other components within normal limits  BASIC METABOLIC PANEL - Abnormal; Notable for the following:    BUN 24 (*)     Creatinine, Ser 1.78 (*)     GFR calc non Af Amer 35 (*)     GFR calc Af Amer 40 (*)     All other components within normal limits  PRO B NATRIURETIC PEPTIDE - Abnormal; Notable for the following:    Pro B Natriuretic peptide (BNP) 1898.0 (*)     All other components within normal limits  GLUCOSE, CAPILLARY   No results found.   1. Swelling of right lower  extremity       MDM  I personally performed the services described in this documentation, which was scribed in my presence. The recorded information has been reviewed and is accurate.    Roy Racer, MD 08/26/12 1945

## 2012-08-26 NOTE — ED Notes (Signed)
Pt c/o right knee pain and swelling x 3 days. Hx of right knee problems requiring aspiration and cortisone injection every 3 months. Has appointment for another injection but pain too great now. Swelling noted to right knee.  Pt wears right lower leg brace since developed "foot drop" post back sx.

## 2012-09-14 ENCOUNTER — Emergency Department (HOSPITAL_COMMUNITY): Payer: Medicare Other

## 2012-09-14 ENCOUNTER — Encounter (HOSPITAL_COMMUNITY): Payer: Self-pay | Admitting: *Deleted

## 2012-09-14 ENCOUNTER — Emergency Department (HOSPITAL_COMMUNITY)
Admission: EM | Admit: 2012-09-14 | Discharge: 2012-09-14 | Disposition: A | Discharge status: Home / Self Care | Payer: Medicare Other | Attending: Emergency Medicine | Admitting: Emergency Medicine

## 2012-09-14 DIAGNOSIS — M109 Gout, unspecified: Secondary | ICD-10-CM | POA: Insufficient documentation

## 2012-09-14 DIAGNOSIS — Z79899 Other long term (current) drug therapy: Secondary | ICD-10-CM | POA: Insufficient documentation

## 2012-09-14 DIAGNOSIS — I10 Essential (primary) hypertension: Secondary | ICD-10-CM | POA: Insufficient documentation

## 2012-09-14 DIAGNOSIS — M7989 Other specified soft tissue disorders: Secondary | ICD-10-CM

## 2012-09-14 DIAGNOSIS — Z87442 Personal history of urinary calculi: Secondary | ICD-10-CM | POA: Insufficient documentation

## 2012-09-14 DIAGNOSIS — R609 Edema, unspecified: Secondary | ICD-10-CM | POA: Insufficient documentation

## 2012-09-14 DIAGNOSIS — Z7982 Long term (current) use of aspirin: Secondary | ICD-10-CM | POA: Insufficient documentation

## 2012-09-14 DIAGNOSIS — E119 Type 2 diabetes mellitus without complications: Secondary | ICD-10-CM | POA: Insufficient documentation

## 2012-09-14 DIAGNOSIS — E7800 Pure hypercholesterolemia, unspecified: Secondary | ICD-10-CM | POA: Insufficient documentation

## 2012-09-14 DIAGNOSIS — Z8679 Personal history of other diseases of the circulatory system: Secondary | ICD-10-CM | POA: Insufficient documentation

## 2012-09-14 DIAGNOSIS — M1710 Unilateral primary osteoarthritis, unspecified knee: Secondary | ICD-10-CM | POA: Insufficient documentation

## 2012-09-14 DIAGNOSIS — M25569 Pain in unspecified knee: Secondary | ICD-10-CM

## 2012-09-14 DIAGNOSIS — R600 Localized edema: Secondary | ICD-10-CM

## 2012-09-14 DIAGNOSIS — M1711 Unilateral primary osteoarthritis, right knee: Secondary | ICD-10-CM

## 2012-09-14 DIAGNOSIS — Z8701 Personal history of pneumonia (recurrent): Secondary | ICD-10-CM | POA: Insufficient documentation

## 2012-09-14 LAB — GLUCOSE, CAPILLARY: Glucose-Capillary: 98 mg/dL (ref 70–99)

## 2012-09-14 MED ORDER — HYDROCODONE-ACETAMINOPHEN 5-325 MG PO TABS
2.0000 | ORAL_TABLET | Freq: Once | ORAL | Status: AC
  Administered 2012-09-14: 2 via ORAL
  Filled 2012-09-14: qty 2

## 2012-09-14 MED ORDER — OXYCODONE-ACETAMINOPHEN 5-325 MG PO TABS: ORAL_TABLET | ORAL | Status: DC

## 2012-09-14 NOTE — ED Notes (Signed)
Patient transported to X-ray 

## 2012-09-14 NOTE — ED Provider Notes (Signed)
History     CSN: [id number]  Arrival date & time 09/14/12  7253   First MD Initiated Contact with Patient 09/14/12 0757      Chief Complaint  Patient presents with  . Leg Swelling    (Consider location/radiation/quality/duration/timing/severity/associated sxs/prior treatment) HPI Comments: Patient reports a history of chronic swelling of both legs but much worse on the right. He reports he has a history of arthritis in the right knee, that they will not do surgery on his knee but does receive steroid injections every 3 months or so and sometimes will have to draw fluid off his knee. He reports the next time he is supposed to have this procedure is sometime in March. He reports gradually worsening swelling and right knee and lower leg discomfort over the last week. He denies fever or chills. He denies chest pain, pleurisy, or shortness of breath and at baseline. Previous records show that the patient was seen for similar complaints on December 26 and had an ultrasound at that point which showed a Baker's cyst. Patient has no prior history of DVT. He does have some chronic ulcerations do to toe deformities of his right foot which requires several dressings to keep him from rubbing ulcerations. He denies any significant skin color change.  The history is provided by the patient and medical records.    Past Medical History  Diagnosis Date  . Pneumonia   . Lymphoma     s/p partial gastrectomy  . Hypertension   . Hypercholesteremia   . Diabetes mellitus   . Gout   . Irregular heart beat   . Kidney stone     Past Surgical History  Procedure Date  . Partial gastrectomy 1994    for lymphoma  . Hernia repair   . Joint replacement   . Back surgery     History reviewed. No pertinent family history.  History  Substance Use Topics  . Smoking status: Never Smoker   . Smokeless tobacco: Never Used  . Alcohol Use: No      Review of Systems  Constitutional: Negative for fever and  chills.  Respiratory: Negative for cough, chest tightness and shortness of breath.   Cardiovascular: Positive for leg swelling. Negative for chest pain.  Musculoskeletal: Positive for arthralgias.  Skin: Negative for color change.  Neurological: Negative for weakness and numbness.    Allergies  Review of patient's allergies indicates no known allergies.  Home Medications   Current Outpatient Rx  Name  Route  Sig  Dispense  Refill  . ACETAMINOPHEN 325 MG PO TABS   Oral   Take 325 mg by mouth every 6 (six) hours as needed. For pain         . ALLOPURINOL 100 MG PO TABS   Oral   Take 100 mg by mouth daily.           . ASPIRIN EC 81 MG PO TBEC   Oral   Take 81 mg by mouth daily.         Marland Kitchen BISACODYL 5 MG PO TBEC   Oral   Take 5 mg by mouth daily as needed. For constipation         . CARVEDILOL 12.5 MG PO TABS   Oral   Take 12.5 mg by mouth 2 (two) times daily with a meal.          . VITAMIN B 12 PO   Oral   Take 1 tablet by mouth every morning.          Marland Kitchen  CYCLOBENZAPRINE HCL 10 MG PO TABS   Oral   Take 10 mg by mouth 3 (three) times daily as needed. For muscle spasms         . ERGOCALCIFEROL 50000 UNITS PO CAPS   Oral   Take 50,000 Units by mouth every 14 (fourteen) days.         . FUROSEMIDE 20 MG PO TABS   Oral   Take 20 mg by mouth daily.          Marland Kitchen GLIPIZIDE 10 MG PO TABS   Oral   Take 10 mg by mouth daily.           Marland Kitchen LISINOPRIL 20 MG PO TABS   Oral   Take 10 mg by mouth daily.          Marland Kitchen NIACIN 500 MG PO TABS   Oral   Take 500 mg by mouth at bedtime.          Marland Kitchen PANTOPRAZOLE SODIUM 40 MG PO TBEC   Oral   Take 40 mg by mouth daily.           Marland Kitchen PIOGLITAZONE HCL 30 MG PO TABS   Oral   Take 15 mg by mouth daily.          Marland Kitchen POLYETHYLENE GLYCOL 3350 PO PACK   Oral   Take 17 g by mouth daily as needed. For constipation         . SENNOSIDES 8.6 MG PO TABS   Oral   Take 2 tablets by mouth 2 (two) times daily.            . SERTRALINE HCL 100 MG PO TABS   Oral   Take 200 mg by mouth daily.          Marland Kitchen SIMVASTATIN 10 MG PO TABS   Oral   Take 10 mg by mouth at bedtime.           . OXYCODONE-ACETAMINOPHEN 5-325 MG PO TABS      1-2 tablets po q 6 hours prn moderate to severe pain   20 tablet   0     BP 163/83  Pulse 65  Temp 97.4 F (36.3 C) (Oral)  Resp 18  SpO2 100%  Physical Exam  Nursing note and vitals reviewed. Constitutional: He appears well-developed and well-nourished.  HENT:  Head: Normocephalic and atraumatic.  Cardiovascular: Normal rate.   Pulses:      Dorsalis pedis pulses are 1+ on the right side, and 1+ on the left side.  Pulmonary/Chest: No respiratory distress. He has no wheezes.  Abdominal: Soft. He exhibits no distension. There is no tenderness.  Musculoskeletal:       Right lower leg: He exhibits tenderness, swelling and edema. He exhibits no bony tenderness, no deformity and no laceration.  Neurological: He is alert.  Skin: Skin is warm. No rash noted.    ED Course  Procedures (including critical care time)  Labs Reviewed - No data to display Dg Knee Complete 4 Views Right  09/14/2012  *RADIOLOGY REPORT*  Clinical Data: Right knee pain, leg swelling, history arthritis  RIGHT KNEE - COMPLETE 4+ VIEW  Comparison: 04/18/2012  Findings: Progressive joint space loss at the medial compartment right knee with bone-on-bone appearance. Small calcified ossicles are seen medial to the medial joint line. Mild articular surface irregularity at the medial femoral condyle. Patellofemoral degenerative changes are also present. Patellar spurs at quadriceps and patellar tendon insertions. Calcific tendonitis within the  proximal patellar tendon. Large knee joint effusion. Osseous demineralization. No acute fracture or dislocation. Regional soft tissue swelling and atherosclerotic calcification noted.  IMPRESSION: Progressive osteoarthritic changes of the right knee since 04/18/2012,  most severe within the medial compartment.   Original Report Authenticated By: Ulyses Southward, M.D.      1. Arthritis of knee, right   2. Peripheral edema   3. Knee pain     Room air saturation is 100% I interpret this to be normal.   10:06 AM Plain film shows severe arthritis, no effusion. DVT ultrasound was negative. Plan is to change his oral analgesics from Vicodin to Percocet and patient should followup with his primary care physician or orthopedist as soon as possible.  MDM   Plan is to repeat DVT ultrasound as well as plain films of right knee. Likely this is acute on chronic right lower extremity pain and swelling.        Gavin Pound. Lio Wehrly, MD 09/14/12 1006

## 2012-09-14 NOTE — Progress Notes (Signed)
Right:  No evidence of DVT, superficial thrombosis, or Baker's cyst.  Left:  Negative for DVT in the common femoral vein.  

## 2012-09-14 NOTE — ED Notes (Signed)
Pt reports leg swelling, worse in right leg. Unsure if he has blood clot. Pain with walking. States usually has fluid drawn out of knee and given cortisone shots.

## 2012-09-14 NOTE — Discharge Instructions (Signed)

## 2012-09-14 NOTE — ED Notes (Signed)
Pt is back from [facility].

## 2012-10-28 ENCOUNTER — Encounter (HOSPITAL_COMMUNITY): Payer: Self-pay | Admitting: Emergency Medicine

## 2012-10-28 ENCOUNTER — Encounter (HOSPITAL_COMMUNITY)
Admission: AD | Disposition: A | Discharge status: Home / Self Care | Payer: Self-pay | Source: Ambulatory Visit | Attending: Gastroenterology

## 2012-10-28 ENCOUNTER — Ambulatory Visit (HOSPITAL_COMMUNITY)
Admission: AD | Admit: 2012-10-28 | Discharge: 2012-10-28 | Disposition: A | Discharge status: Home / Self Care | Payer: Medicare Other | Source: Ambulatory Visit | Attending: Gastroenterology | Admitting: Gastroenterology

## 2012-10-28 ENCOUNTER — Emergency Department (HOSPITAL_COMMUNITY)
Admission: EM | Admit: 2012-10-28 | Discharge: 2012-10-28 | Disposition: A | Discharge status: Home / Self Care | Payer: Medicare Other | Attending: Emergency Medicine | Admitting: Emergency Medicine

## 2012-10-28 ENCOUNTER — Encounter (HOSPITAL_COMMUNITY): Payer: Self-pay | Admitting: *Deleted

## 2012-10-28 DIAGNOSIS — E7800 Pure hypercholesterolemia, unspecified: Secondary | ICD-10-CM | POA: Insufficient documentation

## 2012-10-28 DIAGNOSIS — Z87442 Personal history of urinary calculi: Secondary | ICD-10-CM | POA: Insufficient documentation

## 2012-10-28 DIAGNOSIS — Z79899 Other long term (current) drug therapy: Secondary | ICD-10-CM | POA: Insufficient documentation

## 2012-10-28 DIAGNOSIS — Z7982 Long term (current) use of aspirin: Secondary | ICD-10-CM | POA: Insufficient documentation

## 2012-10-28 DIAGNOSIS — E119 Type 2 diabetes mellitus without complications: Secondary | ICD-10-CM | POA: Insufficient documentation

## 2012-10-28 DIAGNOSIS — R1310 Dysphagia, unspecified: Secondary | ICD-10-CM | POA: Insufficient documentation

## 2012-10-28 DIAGNOSIS — T18108A Unspecified foreign body in esophagus causing other injury, initial encounter: Secondary | ICD-10-CM | POA: Insufficient documentation

## 2012-10-28 DIAGNOSIS — Z8701 Personal history of pneumonia (recurrent): Secondary | ICD-10-CM | POA: Insufficient documentation

## 2012-10-28 DIAGNOSIS — IMO0002 Reserved for concepts with insufficient information to code with codable children: Secondary | ICD-10-CM | POA: Insufficient documentation

## 2012-10-28 DIAGNOSIS — K222 Esophageal obstruction: Secondary | ICD-10-CM | POA: Insufficient documentation

## 2012-10-28 DIAGNOSIS — M109 Gout, unspecified: Secondary | ICD-10-CM | POA: Insufficient documentation

## 2012-10-28 DIAGNOSIS — Z8679 Personal history of other diseases of the circulatory system: Secondary | ICD-10-CM | POA: Insufficient documentation

## 2012-10-28 DIAGNOSIS — Z87898 Personal history of other specified conditions: Secondary | ICD-10-CM | POA: Insufficient documentation

## 2012-10-28 DIAGNOSIS — I10 Essential (primary) hypertension: Secondary | ICD-10-CM | POA: Insufficient documentation

## 2012-10-28 HISTORY — PX: ESOPHAGOGASTRODUODENOSCOPY: SHX5428

## 2012-10-28 LAB — GLUCOSE, CAPILLARY: Glucose-Capillary: 68 mg/dL — ABNORMAL LOW (ref 70–99)

## 2012-10-28 SURGERY — EGD (ESOPHAGOGASTRODUODENOSCOPY)
Anesthesia: Moderate Sedation

## 2012-10-28 MED ORDER — DEXTROSE 5 % IV SOLN
Freq: Once | INTRAVENOUS | Status: AC
  Administered 2012-10-28: 15:00:00 via INTRAVENOUS

## 2012-10-28 MED ORDER — FENTANYL CITRATE 0.05 MG/ML IJ SOLN
INTRAMUSCULAR | Status: DC | PRN
  Administered 2012-10-28 (×3): 25 ug via INTRAVENOUS

## 2012-10-28 MED ORDER — FENTANYL CITRATE 0.05 MG/ML IJ SOLN
INTRAMUSCULAR | Status: AC
  Filled 2012-10-28: qty 2

## 2012-10-28 MED ORDER — SODIUM CHLORIDE 0.9 % IV SOLN: INTRAVENOUS | Status: DC

## 2012-10-28 MED ORDER — MIDAZOLAM HCL 10 MG/2ML IJ SOLN
INTRAMUSCULAR | Status: AC
  Filled 2012-10-28: qty 2

## 2012-10-28 MED ORDER — MIDAZOLAM HCL 10 MG/2ML IJ SOLN
INTRAMUSCULAR | Status: DC | PRN
  Administered 2012-10-28: 2 mg via INTRAVENOUS
  Administered 2012-10-28: 1 mg via INTRAVENOUS
  Administered 2012-10-28: 2 mg via INTRAVENOUS
  Administered 2012-10-28: 1 mg via INTRAVENOUS

## 2012-10-28 NOTE — ED Notes (Signed)
Pt presenting to ed with c/o feeling like something is stuck in his throat pt states onset last night. Pt states pain with swallowing

## 2012-10-28 NOTE — Progress Notes (Signed)
Pati8ent complains of feeling bad that his blood sugar may be down  because of all his pain CBG 68 by finger stick. IV 0.9% saline D/C 250cc absorbed. D5W 500ccup to infuse.

## 2012-10-28 NOTE — ED Notes (Signed)
Pt given po cracker. Pt tolerated eating almost 1 cracker without difficulty and then stated "I don't want anymore it will stay down" "but your not given me what I want pain medication. Md Pollina notified and aware

## 2012-10-28 NOTE — H&P (Addendum)
  [facility] Note  [name] 79 y.o. 02-Dec-1931   HPI: Patient here for EGD due to sensation of food hanging up after eating chicken gizzards yesterday. Difficulty swallowing saliva last night. Had discomfort in esophagus and came to ER feeling that the food was still present. Reportedly able to swallow water and a cracker in ER so he was discharged and comes to endo from the ER because he feels the food is still present. He reports having an EGD at the [place] last year for a food impaction but does not know whether a dilation was done. Last endoscope by Dr. Ewing Schlein in 2012 for a food impaction.   Past Medical History   Diagnosis  Date   .  Pneumonia    .  Lymphoma      s/p partial gastrectomy   .  Hypertension    .  Hypercholesteremia    .  Diabetes mellitus    .  Gout    .  Irregular heart beat    .  Kidney stone     Past Surgical History   Procedure  Laterality  Date   .  Partial gastrectomy   1994     for lymphoma   .  Hernia repair     .  Joint replacement     .  Back surgery     No family history on file.  History   Substance Use Topics   .  Smoking status:  Never Smoker   .  Smokeless tobacco:  Never Used   .  Alcohol Use:  No        Objective: Vital signs in last 24 hours: Filed Vitals:   10/28/12 1300  BP: 177/102  Pulse: 58  Temp: 97.6  Resp:     Physical Exam: Gen: elderly, alert, no acute distress HEENT: anicteric, oropharynx clear Chest: CTA B CV: RRR without murmurs Abd: distended with large midline scar, diffusely tender, +BS    Assessment/Plan: 79 yo with dysphagia and possible food impaction in need of EGD with possible dilation. Likely has a stricture or ring.   [name] C. 10/28/2012, 2:34 PM

## 2012-10-28 NOTE — Op Note (Signed)
[facility] [address] [place], [address]   ENDOSCOPY PROCEDURE REPORT  PATIENT: [name], [name]  MR#: [id number] BIRTHDATE: 1931-09-17 , 80  yrs. old GENDER: Male  ENDOSCOPIST: Charlott Rakes, MD REFERRED BY:  PROCEDURE DATE:  10/28/2012 PROCEDURE:   EGD w/ fb removal ASA CLASS:   Class III INDICATIONS:Dysphagia.   Foreign body removal. MEDICATIONS: Fentanyl 87.5 mcg IV and Versed 5 mg IV  TOPICAL ANESTHETIC:  DESCRIPTION OF PROCEDURE:   After the risks benefits and alternatives of the procedure were thoroughly explained, informed consent was obtained.  The Pentax Gastroscope [id number]  endoscope was introduced through the mouth and advanced to the proximal jejunum , limited by Without limitations.   The instrument was slowly withdrawn as the mucosa was fully examined.     FINDINGS: The endoscope was inserted into the oropharynx and esophagus was intubated. Upon entering the esophagus there was a large amount of solid food starting at 25 cm in the esophagus and extended about 10 cm. This food was removed with multiple passes of the [place] net. After clearance of the food bolus a distal esophageal ring was seen near the GEJ without any evidence of mucosal inflammation or ulceration. The endoscope was advanced into the stomach without resistance. The stomach had a postop appearance of a Billroth- II with gastrojejunal anastomosis and efferent and afferent limbs. These jejunal limbs were patent and clear bilious fluid was seen. The anastomosis was edematous and had a focal area of nodularity and inflammation seen. Biopsies were taken. Retroflexion revealed normal proximal stomach. The endoscope was withdrawn back into the stomach and then into the esophagus. A TTS balloon was used to dilate the esophageal ring from 15mm, 16.69mm, and 18mm. Successful dilation was achieved at 18mm but not at 15mm or 16.24mm.  COMPLICATIONS: None  ENDOSCOPIC  IMPRESSION:     Large food impaction removed from esophagus Distal esophageal ring - s/p balloon dilation to 18mm Likely has a component of esophageal dysmotility Inflamed gastrojejunal anastomosis - s/p biopsies  RECOMMENDATIONS: PPI QD, f/u on path, f/u with Dr. Ewing Schlein in 2-3 weeks   REPEAT EXAM: N/A  _______________________________ Charlott Rakes, MD eSigned:  Charlott Rakes, MD 10/28/2012 3:55 PM    CC:  PATIENT NAME:  [name], [name] MR#: [id number]

## 2012-10-28 NOTE — Interval H&P Note (Signed)
History and Physical Interval Note:  10/28/2012 2:41 PM  [name]  has presented today for surgery, with the diagnosis of Food Impaction  The various methods of treatment have been discussed with the patient and family. After consideration of risks, benefits and other options for treatment, the patient has consented to  Procedure(s): ESOPHAGOGASTRODUODENOSCOPY (EGD) (N/A) as a surgical intervention .  The patient's history has been reviewed, patient examined, no change in status, stable for surgery.  I have reviewed the patient's chart and labs.  Questions were answered to the patient's satisfaction.     Tonianne Fine C.

## 2012-10-28 NOTE — Interval H&P Note (Signed)
History and Physical Interval Note:  10/28/2012 2:41 PM  [name]  has presented today for surgery, with the diagnosis of Food Impaction  The various methods of treatment have been discussed with the patient and family. After consideration of risks, benefits and other options for treatment, the patient has consented to  Procedure(s): ESOPHAGOGASTRODUODENOSCOPY (EGD) (N/A) as a surgical intervention .  The patient's history has been reviewed, patient examined, no change in status, stable for surgery.  I have reviewed the patient's chart and labs.  Questions were answered to the patient's satisfaction.     Zitlali Primm C.   

## 2012-10-28 NOTE — ED Notes (Signed)
Pt given water per Md Pollina verbal order pt able to swallow 2 sips of water without difficulty. Pt states he does not want to drink any water but he will.

## 2012-10-28 NOTE — ED Provider Notes (Signed)
History     CSN: [id number]  Arrival date & time 10/28/12  0946   First MD Initiated Contact with Patient 10/28/12 [phone]      No chief complaint on file.   (Consider location/radiation/quality/duration/timing/severity/associated sxs/prior treatment) HPI Comments: Patient presents stating that he has food stuck in his throat. Patient reports that he was eating chicken gizzards yesterday and it got caught in his throat. Patient reports that he has been having trouble swallowing his saliva last night, now seems to be doing better with that. He still feels like the food is stuck in his throat he has some discomfort in the lower esophagus region. Patient has not had any difficulty breathing. Patient indicates that he has had similar problems before requiring endoscopy. He called his GI doctor and was told to come to the ER.   Past Medical History  Diagnosis Date  . Pneumonia   . Lymphoma     s/p partial gastrectomy  . Hypertension   . Hypercholesteremia   . Diabetes mellitus   . Gout   . Irregular heart beat   . Kidney stone     Past Surgical History  Procedure Laterality Date  . Partial gastrectomy  1994    for lymphoma  . Hernia repair    . Joint replacement    . Back surgery      No family history on file.  History  Substance Use Topics  . Smoking status: Never Smoker   . Smokeless tobacco: Never Used  . Alcohol Use: No      Review of Systems  Respiratory: Negative.   Gastrointestinal:       Dysphagia  All other systems reviewed and are negative.    Allergies  Review of patient's allergies indicates no known allergies.  Home Medications   Current Outpatient Rx  Name  Route  Sig  Dispense  Refill  . acetaminophen (TYLENOL) 325 MG tablet   Oral   Take 325 mg by mouth every 6 (six) hours as needed. For pain         . allopurinol (ZYLOPRIM) 100 MG tablet   Oral   Take 100 mg by mouth daily.           Marland Kitchen aspirin EC 81 MG tablet   Oral   Take 81 mg  by mouth daily.         . bisacodyl (DULCOLAX) 5 MG EC tablet   Oral   Take 5 mg by mouth daily as needed. For constipation         . carvedilol (COREG) 12.5 MG tablet   Oral   Take 12.5 mg by mouth 2 (two) times daily with a meal.          . Cyanocobalamin (VITAMIN B 12 PO)   Oral   Take 1 tablet by mouth every morning.          . cyclobenzaprine (FLEXERIL) 10 MG tablet   Oral   Take 10 mg by mouth 3 (three) times daily as needed. For muscle spasms         . ergocalciferol (VITAMIN D2) 50000 UNITS capsule   Oral   Take 50,000 Units by mouth every 14 (fourteen) days.         . furosemide (LASIX) 20 MG tablet   Oral   Take 20 mg by mouth daily.          Marland Kitchen glipiZIDE (GLUCOTROL) 10 MG tablet   Oral   Take 10 mg  by mouth daily.           Marland Kitchen lisinopril (PRINIVIL,ZESTRIL) 20 MG tablet   Oral   Take 10 mg by mouth daily.          . niacin 500 MG tablet   Oral   Take 500 mg by mouth at bedtime.          Marland Kitchen oxyCODONE-acetaminophen (PERCOCET/ROXICET) 5-325 MG per tablet      1-2 tablets po q 6 hours prn moderate to severe pain   20 tablet   0   . pantoprazole (PROTONIX) 40 MG tablet   Oral   Take 40 mg by mouth daily.           . pioglitazone (ACTOS) 30 MG tablet   Oral   Take 15 mg by mouth daily.          . polyethylene glycol (MIRALAX / GLYCOLAX) packet   Oral   Take 17 g by mouth daily as needed. For constipation         . senna (SENOKOT) 8.6 MG tablet   Oral   Take 2 tablets by mouth 2 (two) times daily.          . sertraline (ZOLOFT) 100 MG tablet   Oral   Take 200 mg by mouth daily.          . simvastatin (ZOCOR) 10 MG tablet   Oral   Take 10 mg by mouth at bedtime.             There were no vitals taken for this visit.  Physical Exam  Constitutional: He is oriented to person, place, and time. He appears well-developed and well-nourished. No distress.  HENT:  Head: Normocephalic and atraumatic.  Right Ear: Hearing  normal.  Nose: Nose normal.  Mouth/Throat: Oropharynx is clear and moist and mucous membranes are normal.  Eyes: Conjunctivae and EOM are normal. Pupils are equal, round, and reactive to light.  Neck: Normal range of motion. Neck supple.  Cardiovascular: Normal rate, regular rhythm, S1 normal and S2 normal.  Exam reveals no gallop and no friction rub.   No murmur heard. Pulmonary/Chest: Effort normal and breath sounds normal. No respiratory distress. He exhibits no tenderness.  Abdominal: Soft. Normal appearance and bowel sounds are normal. There is no hepatosplenomegaly. There is no tenderness. There is no rebound, no guarding, no tenderness at McBurney's point and negative Murphy's sign. No hernia.  Musculoskeletal: Normal range of motion.  Neurological: He is alert and oriented to person, place, and time. He has normal strength. No cranial nerve deficit or sensory deficit. Coordination normal. GCS eye subscore is 4. GCS verbal subscore is 5. GCS motor subscore is 6.  Skin: Skin is warm, dry and intact. No rash noted. No cyanosis.  Psychiatric: He has a normal mood and affect. His speech is normal and behavior is normal. Thought content normal.    ED Course  Procedures (including critical care time)  Labs Reviewed - No data to display No results found.   Diagnosis: Dysphagia    MDM  Patient came to the ER indicating that he got food stuck in his esophagus last night. He does note the description of impaction, but symptoms seem to have resolved. He is no longer having difficulty swallowing his saliva. Patient was able to drink water and eat a cracker here in the ER without difficulty. Patient still thinks that the food is stuck in his esophagus, but being able to swallow solids  and liquids for this now. He is referred back to his gastroenterologist.        Gilda Crease, MD 10/28/12 1056

## 2012-10-29 ENCOUNTER — Encounter (HOSPITAL_COMMUNITY): Payer: Self-pay | Admitting: Gastroenterology

## 2012-11-12 ENCOUNTER — Emergency Department (HOSPITAL_COMMUNITY): Payer: Medicare Other

## 2012-11-12 ENCOUNTER — Inpatient Hospital Stay (HOSPITAL_COMMUNITY): Payer: Medicare Other

## 2012-11-12 ENCOUNTER — Inpatient Hospital Stay (HOSPITAL_COMMUNITY)
Admission: EM | Admit: 2012-11-12 | Discharge: 2012-11-25 | DRG: 286 | Disposition: A | Discharge status: Home Health | Payer: Medicare Other | Attending: Internal Medicine | Admitting: Internal Medicine

## 2012-11-12 ENCOUNTER — Encounter (HOSPITAL_COMMUNITY): Payer: Self-pay | Admitting: *Deleted

## 2012-11-12 DIAGNOSIS — M216X9 Other acquired deformities of unspecified foot: Secondary | ICD-10-CM | POA: Diagnosis present

## 2012-11-12 DIAGNOSIS — Z96659 Presence of unspecified artificial knee joint: Secondary | ICD-10-CM

## 2012-11-12 DIAGNOSIS — N183 Chronic kidney disease, stage 3 unspecified: Secondary | ICD-10-CM | POA: Diagnosis present

## 2012-11-12 DIAGNOSIS — E7800 Pure hypercholesterolemia, unspecified: Secondary | ICD-10-CM | POA: Diagnosis present

## 2012-11-12 DIAGNOSIS — M109 Gout, unspecified: Secondary | ICD-10-CM | POA: Diagnosis present

## 2012-11-12 DIAGNOSIS — R1310 Dysphagia, unspecified: Secondary | ICD-10-CM

## 2012-11-12 DIAGNOSIS — E119 Type 2 diabetes mellitus without complications: Secondary | ICD-10-CM | POA: Diagnosis present

## 2012-11-12 DIAGNOSIS — I50810 Right heart failure, unspecified: Secondary | ICD-10-CM

## 2012-11-12 DIAGNOSIS — I5033 Acute on chronic diastolic (congestive) heart failure: Secondary | ICD-10-CM | POA: Diagnosis present

## 2012-11-12 DIAGNOSIS — N189 Chronic kidney disease, unspecified: Secondary | ICD-10-CM

## 2012-11-12 DIAGNOSIS — IMO0002 Reserved for concepts with insufficient information to code with codable children: Secondary | ICD-10-CM | POA: Diagnosis present

## 2012-11-12 DIAGNOSIS — D631 Anemia in chronic kidney disease: Secondary | ICD-10-CM | POA: Diagnosis present

## 2012-11-12 DIAGNOSIS — N200 Calculus of kidney: Secondary | ICD-10-CM

## 2012-11-12 DIAGNOSIS — I059 Rheumatic mitral valve disease, unspecified: Secondary | ICD-10-CM | POA: Diagnosis present

## 2012-11-12 DIAGNOSIS — I130 Hypertensive heart and chronic kidney disease with heart failure and stage 1 through stage 4 chronic kidney disease, or unspecified chronic kidney disease: Principal | ICD-10-CM | POA: Diagnosis present

## 2012-11-12 DIAGNOSIS — D649 Anemia, unspecified: Secondary | ICD-10-CM | POA: Diagnosis present

## 2012-11-12 DIAGNOSIS — I4510 Unspecified right bundle-branch block: Secondary | ICD-10-CM | POA: Diagnosis present

## 2012-11-12 DIAGNOSIS — I509 Heart failure, unspecified: Secondary | ICD-10-CM | POA: Diagnosis present

## 2012-11-12 DIAGNOSIS — N179 Acute kidney failure, unspecified: Secondary | ICD-10-CM | POA: Diagnosis present

## 2012-11-12 DIAGNOSIS — I1310 Hypertensive heart and chronic kidney disease without heart failure, with stage 1 through stage 4 chronic kidney disease, or unspecified chronic kidney disease: Secondary | ICD-10-CM

## 2012-11-12 DIAGNOSIS — M1990 Unspecified osteoarthritis, unspecified site: Secondary | ICD-10-CM | POA: Diagnosis present

## 2012-11-12 LAB — CBC
Hemoglobin: 11.9 g/dL — ABNORMAL LOW (ref 13.0–17.0)
MCH: 30.3 pg (ref 26.0–34.0)
MCV: 90.6 fL (ref 78.0–100.0)
Platelets: 155 10*3/uL (ref 150–400)
Platelets: 163 10*3/uL (ref 150–400)
RBC: 3.55 MIL/uL — ABNORMAL LOW (ref 4.22–5.81)
RBC: 3.93 MIL/uL — ABNORMAL LOW (ref 4.22–5.81)
RDW: 13.6 % (ref 11.5–15.5)
WBC: 5.6 10*3/uL (ref 4.0–10.5)
WBC: 5.9 10*3/uL (ref 4.0–10.5)

## 2012-11-12 LAB — GLUCOSE, CAPILLARY
Glucose-Capillary: 66 mg/dL — ABNORMAL LOW (ref 70–99)
Glucose-Capillary: 171 mg/dL — ABNORMAL HIGH (ref 70–99)

## 2012-11-12 LAB — URINALYSIS, ROUTINE W REFLEX MICROSCOPIC
Ketones, ur: NEGATIVE mg/dL
Leukocytes, UA: NEGATIVE
Nitrite: NEGATIVE
Specific Gravity, Urine: 1.007 (ref 1.005–1.030)
pH: 6 (ref 5.0–8.0)

## 2012-11-12 LAB — POCT I-STAT TROPONIN I: Troponin i, poc: 0.02 ng/mL (ref 0.00–0.08)

## 2012-11-12 LAB — TROPONIN I: Troponin I: <0.3 ng/mL (ref <0.30)

## 2012-11-12 LAB — BASIC METABOLIC PANEL
CO2: 26 mEq/L (ref 19–32)
Chloride: 106 mEq/L (ref 96–112)
GFR calc Af Amer: 49 mL/min — ABNORMAL LOW (ref >90)
Potassium: 3.2 mEq/L — ABNORMAL LOW (ref 3.5–5.1)
Sodium: 140 mEq/L (ref 135–145)

## 2012-11-12 LAB — CREATININE, SERUM: GFR calc Af Amer: 54 mL/min — ABNORMAL LOW (ref >90)

## 2012-11-12 LAB — PRO B NATRIURETIC PEPTIDE: Pro B Natriuretic peptide (BNP): 4088 pg/mL — ABNORMAL HIGH (ref 0–450)

## 2012-11-12 LAB — LIPID PANEL
HDL: 38 mg/dL — ABNORMAL LOW (ref >39)
LDL Cholesterol: 37 mg/dL (ref 0–99)
Triglycerides: 60 mg/dL (ref <150)

## 2012-11-12 LAB — HEPATIC FUNCTION PANEL
Alkaline Phosphatase: 143 U/L — ABNORMAL HIGH (ref 39–117)
Indirect Bilirubin: 0.4 mg/dL (ref 0.3–0.9)
Total Protein: 6.7 g/dL (ref 6.0–8.3)

## 2012-11-12 MED ORDER — NIACIN 500 MG PO TABS
1000.0000 mg | ORAL_TABLET | Freq: Every day | ORAL | Status: DC
  Administered 2012-11-12 – 2012-11-24 (×13): 1000 mg via ORAL
  Filled 2012-11-12 (×14): qty 2

## 2012-11-12 MED ORDER — INSULIN ASPART 100 UNIT/ML ~~LOC~~ SOLN
0.0000 [IU] | Freq: Three times a day (TID) | SUBCUTANEOUS | Status: DC
  Administered 2012-11-14 – 2012-11-17 (×5): 3 [IU] via SUBCUTANEOUS
  Administered 2012-11-17: 2 [IU] via SUBCUTANEOUS
  Administered 2012-11-17 – 2012-11-19 (×4): 3 [IU] via SUBCUTANEOUS
  Administered 2012-11-19 (×2): 2 [IU] via SUBCUTANEOUS
  Administered 2012-11-20: 3 [IU] via SUBCUTANEOUS
  Administered 2012-11-20: 2 [IU] via SUBCUTANEOUS
  Administered 2012-11-20: 5 [IU] via SUBCUTANEOUS
  Administered 2012-11-21 (×2): 3 [IU] via SUBCUTANEOUS
  Administered 2012-11-21 – 2012-11-22 (×3): 5 [IU] via SUBCUTANEOUS
  Administered 2012-11-22: 3 [IU] via SUBCUTANEOUS
  Administered 2012-11-23 (×3): 5 [IU] via SUBCUTANEOUS
  Administered 2012-11-24: 3 [IU] via SUBCUTANEOUS
  Administered 2012-11-24 – 2012-11-25 (×2): 2 [IU] via SUBCUTANEOUS

## 2012-11-12 MED ORDER — ARTIFICIAL TEAR OINTMENT OP OINT
1.0000 "application " | TOPICAL_OINTMENT | Freq: Three times a day (TID) | OPHTHALMIC | Status: DC | PRN
  Filled 2012-11-12: qty 3.5

## 2012-11-12 MED ORDER — ACETAMINOPHEN 325 MG PO TABS
650.0000 mg | ORAL_TABLET | ORAL | Status: DC | PRN
  Administered 2012-11-13 – 2012-11-19 (×4): 650 mg via ORAL
  Filled 2012-11-12 (×2): qty 2

## 2012-11-12 MED ORDER — POTASSIUM CHLORIDE CRYS ER 20 MEQ PO TBCR
20.0000 meq | EXTENDED_RELEASE_TABLET | Freq: Every day | ORAL | Status: DC
  Administered 2012-11-12 – 2012-11-13 (×2): 20 meq via ORAL
  Filled 2012-11-12 (×2): qty 1

## 2012-11-12 MED ORDER — BISACODYL 5 MG PO TBEC
5.0000 mg | DELAYED_RELEASE_TABLET | Freq: Every day | ORAL | Status: DC | PRN
  Administered 2012-11-13 – 2012-11-19 (×3): 5 mg via ORAL
  Filled 2012-11-12 (×3): qty 1

## 2012-11-12 MED ORDER — PANTOPRAZOLE SODIUM 40 MG PO TBEC
40.0000 mg | DELAYED_RELEASE_TABLET | Freq: Every day | ORAL | Status: DC
  Administered 2012-11-12 – 2012-11-25 (×14): 40 mg via ORAL
  Filled 2012-11-12 (×16): qty 1

## 2012-11-12 MED ORDER — HEPARIN SODIUM (PORCINE) 5000 UNIT/ML IJ SOLN
5000.0000 [IU] | Freq: Three times a day (TID) | INTRAMUSCULAR | Status: DC
  Administered 2012-11-12 – 2012-11-25 (×34): 5000 [IU] via SUBCUTANEOUS
  Filled 2012-11-12 (×42): qty 1

## 2012-11-12 MED ORDER — SODIUM CHLORIDE 0.9 % IJ SOLN
3.0000 mL | Freq: Two times a day (BID) | INTRAMUSCULAR | Status: DC
  Administered 2012-11-12 – 2012-11-23 (×10): 3 mL via INTRAVENOUS

## 2012-11-12 MED ORDER — GABAPENTIN 100 MG PO CAPS
100.0000 mg | ORAL_CAPSULE | Freq: Two times a day (BID) | ORAL | Status: DC
  Administered 2012-11-12 – 2012-11-25 (×26): 100 mg via ORAL
  Filled 2012-11-12 (×27): qty 1

## 2012-11-12 MED ORDER — POLYETHYLENE GLYCOL 3350 17 G PO PACK
17.0000 g | PACK | Freq: Every day | ORAL | Status: DC
  Administered 2012-11-13 – 2012-11-25 (×12): 17 g via ORAL
  Filled 2012-11-12 (×14): qty 1

## 2012-11-12 MED ORDER — SODIUM CHLORIDE 0.9 % IV SOLN
250.0000 mL | INTRAVENOUS | Status: DC | PRN
  Administered 2012-11-16: 250 mL via INTRAVENOUS

## 2012-11-12 MED ORDER — ASPIRIN EC 81 MG PO TBEC
81.0000 mg | DELAYED_RELEASE_TABLET | Freq: Every day | ORAL | Status: DC
  Administered 2012-11-12 – 2012-11-25 (×14): 81 mg via ORAL
  Filled 2012-11-12 (×14): qty 1

## 2012-11-12 MED ORDER — SODIUM CHLORIDE 0.9 % IJ SOLN: 3.0000 mL | INTRAMUSCULAR | Status: DC | PRN

## 2012-11-12 MED ORDER — ERGOCALCIFEROL 1.25 MG (50000 UT) PO CAPS
50000.0000 [IU] | ORAL_CAPSULE | ORAL | Status: DC
  Administered 2012-11-12: 50000 [IU] via ORAL
  Filled 2012-11-12: qty 1

## 2012-11-12 MED ORDER — HYDROCODONE-ACETAMINOPHEN 5-325 MG PO TABS
1.0000 | ORAL_TABLET | Freq: Once | ORAL | Status: AC
  Administered 2012-11-12: 1 via ORAL
  Filled 2012-11-12: qty 1

## 2012-11-12 MED ORDER — ONDANSETRON HCL 4 MG/2ML IJ SOLN
4.0000 mg | Freq: Four times a day (QID) | INTRAMUSCULAR | Status: DC | PRN
  Administered 2012-11-13 – 2012-11-24 (×7): 4 mg via INTRAVENOUS
  Filled 2012-11-12 (×5): qty 2

## 2012-11-12 MED ORDER — CARVEDILOL 12.5 MG PO TABS
12.5000 mg | ORAL_TABLET | Freq: Two times a day (BID) | ORAL | Status: DC
  Administered 2012-11-13 – 2012-11-25 (×25): 12.5 mg via ORAL
  Filled 2012-11-12 (×29): qty 1

## 2012-11-12 MED ORDER — FUROSEMIDE 10 MG/ML IJ SOLN
40.0000 mg | Freq: Once | INTRAMUSCULAR | Status: AC
  Administered 2012-11-12: 40 mg via INTRAVENOUS
  Filled 2012-11-12: qty 4

## 2012-11-12 MED ORDER — CYCLOSPORINE 0.05 % OP EMUL
1.0000 [drp] | Freq: Two times a day (BID) | OPHTHALMIC | Status: DC
  Administered 2012-11-12 – 2012-11-25 (×24): 1 [drp] via OPHTHALMIC
  Filled 2012-11-12 (×34): qty 1

## 2012-11-12 MED ORDER — FUROSEMIDE 10 MG/ML IJ SOLN
80.0000 mg | Freq: Three times a day (TID) | INTRAMUSCULAR | Status: DC
  Administered 2012-11-12 – 2012-11-13 (×4): 80 mg via INTRAVENOUS
  Filled 2012-11-12 (×6): qty 8

## 2012-11-12 MED ORDER — HYPROMELLOSE (GONIOSCOPIC) 2.5 % OP SOLN
1.0000 [drp] | Freq: Three times a day (TID) | OPHTHALMIC | Status: DC | PRN
  Filled 2012-11-12: qty 15

## 2012-11-12 MED ORDER — ACETAMINOPHEN 325 MG PO TABS
325.0000 mg | ORAL_TABLET | Freq: Four times a day (QID) | ORAL | Status: DC | PRN
  Administered 2012-11-18: 325 mg via ORAL
  Filled 2012-11-12 (×2): qty 2
  Filled 2012-11-12: qty 1

## 2012-11-12 MED ORDER — HYDROCODONE-ACETAMINOPHEN 5-325 MG PO TABS
1.0000 | ORAL_TABLET | Freq: Four times a day (QID) | ORAL | Status: DC | PRN
  Administered 2012-11-12 – 2012-11-14 (×4): 1 via ORAL
  Filled 2012-11-12 (×5): qty 1

## 2012-11-12 MED ORDER — SERTRALINE HCL 100 MG PO TABS
200.0000 mg | ORAL_TABLET | Freq: Every day | ORAL | Status: DC
  Administered 2012-11-12 – 2012-11-24 (×13): 200 mg via ORAL
  Filled 2012-11-12 (×14): qty 2

## 2012-11-12 MED ORDER — SIMVASTATIN 10 MG PO TABS
10.0000 mg | ORAL_TABLET | Freq: Every day | ORAL | Status: DC
  Administered 2012-11-12 – 2012-11-24 (×13): 10 mg via ORAL
  Filled 2012-11-12 (×14): qty 1

## 2012-11-12 NOTE — ED Notes (Signed)
INTERNAL MEDICINE AT BEDSIDE ASSESSING PT

## 2012-11-12 NOTE — Progress Notes (Signed)
Pt was asking nurse for his mask that he uses at home. Pt asked RN to go in the closet into his bag and get his mask out so that the nurse would know what mask he was talking about. RN went to get medicine out of the pt's bag and the pt started yelling and stated "you can't just go in my stuff! No one asked you to go in my stuff. Those are my home medications." The RN explained to the patient that he told her to go in his bags and that he can't have his home medications in his room while he's a patient so someone will have to come and get them or they would have to be put in a safe until he is discharged. The charge RN came in to speak to the patient also and explained to him that he can't keep his home medications in his room and that it was against hospital policy for him to do so. The patient continued to yell at her also. The pt stated "just leave it alone and get out. I don't want either one of you to do anything for me. Just get out!" The RN and charge RN left the room. The pt will not allow the RN to access him and is upset that he has to be on a bed alarm because he is using the restroom frequently because he is being given lasix for his CHF.

## 2012-11-12 NOTE — ED Notes (Signed)
Pt up to restroom with assist to have bm

## 2012-11-12 NOTE — Progress Notes (Signed)
Pt refuses to take his heparin shot. RN explained to the patient the importance of taking his medication and Pt still refused.

## 2012-11-12 NOTE — ED Notes (Signed)
AIDET performed. 

## 2012-11-12 NOTE — ED Provider Notes (Signed)
History     CSN: [id number]  Arrival date & time 11/12/12  4098   First MD Initiated Contact with Patient 11/12/12 1059      Chief Complaint  Patient presents with  . Shortness of Breath  . Edema    (Consider location/radiation/quality/duration/timing/severity/associated sxs/prior treatment) HPI Comments: [name] is a 79 y.o. Male who is here for evaluation of swelling legs and abdomen, and weight gain. He was at his gastroenterologist office, 4 days ago, and weighed 286 pounds. He denies recent anorexia, nausea, vomiting, fever, chills, cough, shortness of breath, chest pain, weakness, or dizziness. He has right knee pain status post knee replacement and has pain with walking, in the right knee. There are no other known modifying factors. He gets most of his medical care at the [place] in [place]. He sees his primary care doctor in [place], sporadically.      Patient is a 79 y.o. male presenting with shortness of breath. The history is provided by the patient.  Shortness of Breath   Past Medical History  Diagnosis Date  . Pneumonia   . Lymphoma     s/p partial gastrectomy  . Hypertension   . Hypercholesteremia   . Diabetes mellitus   . Gout   . Irregular heart beat   . Kidney stone     Past Surgical History  Procedure Laterality Date  . Partial gastrectomy  1994    for lymphoma  . Hernia repair    . Joint replacement    . Back surgery    . Esophagogastroduodenoscopy N/A 10/28/2012    Procedure: ESOPHAGOGASTRODUODENOSCOPY (EGD);  Surgeon: Shirley Friar, MD;  Location: [facility];  Service: Endoscopy;  Laterality: N/A;    History reviewed. No pertinent family history.  History  Substance Use Topics  . Smoking status: Never Smoker   . Smokeless tobacco: Never Used  . Alcohol Use: No      Review of Systems  Respiratory: Positive for shortness of breath.   All other systems reviewed and are negative.    Allergies  Review of patient's  allergies indicates no known allergies.  Home Medications   No current outpatient prescriptions on file.  BP 193/86  Pulse 62  Temp(Src) 98.6 F (37 C) (Oral)  Resp 18  Ht 6\' 2"  (1.88 m)  Wt 277 lb 12.5 oz (126 kg)  BMI 35.65 kg/m2  SpO2 98%  Physical Exam  Nursing note and vitals reviewed. Constitutional: He is oriented to person, place, and time. He appears well-developed and well-nourished.  HENT:  Head: Normocephalic and atraumatic.  Right Ear: External ear normal.  Left Ear: External ear normal.  Eyes: Conjunctivae and EOM are normal. Pupils are equal, round, and reactive to light.  Neck: Normal range of motion and phonation normal. Neck supple.  Cardiovascular: Normal rate, regular rhythm, normal heart sounds and intact distal pulses.   He has anasarca extending from the upper abdomen to the toes. He has bilateral JVD sitting, at 75.  Pulmonary/Chest: Effort normal and breath sounds normal. No respiratory distress. He has no wheezes. He has no rales. He exhibits no bony tenderness.  Abdominal: Soft. Normal appearance. There is no tenderness.  Genitourinary:  Genitalia half edema, consistent with anasarca  Musculoskeletal: Normal range of motion.  Right knee is swollen and tender  Neurological: He is alert and oriented to person, place, and time. He has normal strength. No cranial nerve deficit or sensory deficit. He exhibits normal muscle tone. Coordination normal.  Skin:  Skin is warm, dry and intact.  Psychiatric: He has a normal mood and affect. His behavior is normal. Judgment and thought content normal.    ED Course  Procedures (including critical care time)  I discussed the case with his GI doctor, Magod; the patient's weight on 3/10 was 286 pounds. Last weight in the office in October 2009, was 241 pounds.  I discussed the case with his PCP, Dr. Petra Kuba; the last weight in his office was 235 pounds on 10/05/12. Weight today is 278 lbs., a 43 pound weight in 5  weeks    Date: 06/18/2012  Rate: 64  Rhythm: normal sinus rhythm  QRS Axis: normal  PR and QT Intervals: PR prolonged  ST/T Wave abnormalities: normal  PR and QRS Conduction Disutrbances:right bundle branch block  Narrative Interpretation:   Old EKG Reviewed: unchanged   Labs Reviewed  CBC - Abnormal; Notable for the following:    RBC 3.55 (*)    Hemoglobin 10.7 (*)    HCT 32.4 (*)    All other components within normal limits  BASIC METABOLIC PANEL - Abnormal; Notable for the following:    Potassium 3.2 (*)    Creatinine, Ser 1.52 (*)    GFR calc non Af Amer 42 (*)    GFR calc Af Amer 49 (*)    All other components within normal limits  PRO B NATRIURETIC PEPTIDE - Abnormal; Notable for the following:    Pro B Natriuretic peptide (BNP) 4088.0 (*)    All other components within normal limits  HEPATIC FUNCTION PANEL - Abnormal; Notable for the following:    Albumin 3.2 (*)    Alkaline Phosphatase 143 (*)    All other components within normal limits  URINALYSIS, ROUTINE W REFLEX MICROSCOPIC - Abnormal; Notable for the following:    Hgb urine dipstick TRACE (*)    Protein, ur 30 (*)    All other components within normal limits  GLUCOSE, CAPILLARY - Abnormal; Notable for the following:    Glucose-Capillary 66 (*)    All other components within normal limits  CBC - Abnormal; Notable for the following:    RBC 3.93 (*)    Hemoglobin 11.9 (*)    HCT 35.6 (*)    All other components within normal limits  CREATININE, SERUM - Abnormal; Notable for the following:    Creatinine, Ser 1.40 (*)    GFR calc non Af Amer 47 (*)    GFR calc Af Amer 54 (*)    All other components within normal limits  LIPID PANEL - Abnormal; Notable for the following:    HDL 38 (*)    All other components within normal limits  URINE CULTURE  URINE MICROSCOPIC-ADD ON  TROPONIN I  BASIC METABOLIC PANEL  TROPONIN I  TROPONIN I  TSH  HEMOGLOBIN A1C  POCT I-STAT TROPONIN I   Dg Chest Port 1  View  11/12/2012  *RADIOLOGY REPORT*  Clinical Data: Shortness of breath.  PORTABLE CHEST - 1 VIEW  Comparison: PA and lateral chest 11/22/2011.  Findings: The patient is rotated on the study.  There is cardiomegaly without edema.  Lungs appear clear.  No pneumothorax or pleural fluid.  Right shoulder replacement noted.  Severe degenerative disease left shoulder is identified.  IMPRESSION: Cardiomegaly without acute disease.   Original Report Authenticated By: Holley Dexter, M.D.      1. Anasarca   2. Myalgia   3. Knee pain, right   4. CHF (congestive heart failure)  5. CKD (chronic kidney disease) stage 3, GFR 30-59 ml/min   6. Diabetes mellitus       MDM  Swelling secondary to anasarca.     Plan: Admit    Flint Melter, MD 11/12/12 [phone]

## 2012-11-12 NOTE — ED Notes (Signed)
Pt reports swelling to abd and legs for extended amount of time, now having sob. spo2 100% on room air.

## 2012-11-12 NOTE — ED Notes (Signed)
PT GIVEN ORANGE JUICE PER HIS REQUEST DUE TO BLOOD SUGAR LOW AND NOT EATING SINCE TAKING DIABETIC MEDS THIS MORNING.

## 2012-11-12 NOTE — H&P (Signed)
PATIENT DETAILS Name: [name] Age: 79 y.o. Sex: male Date of Birth: 1932/01/01 Admit Date: 11/12/2012 ZOX:[id number] JR,GEORGE R, MD   CHIEF COMPLAINT:  Generalized body swelling for 2-3 weeks  HPI: Patient is a 79 year old African American male with a past medical history of diabetes, hypertension, prior history of gastric lymphoma status post gastrectomy, severe osteoarthritis predominantly affecting his right knee , prior history of back surgery and resultant right foot drop comes to the ED for evaluation of the above-noted complaints. The patient a few weeks ago he started noticing that he was having bilateral leg edema, but slowly worsened and progressed proximally. A few days ago he claimed that he started having abdominal distention. He denies any exertional dyspnea, he chronically has 3 pillow orthopnea (for reflux), denies PND. He denies any chest pain. He was evaluated in the emergency room, found to have anasarca, I was subsequently asked to admit this patient for further evaluation and treatment. Patient has long-standing problems with his right knee and has a right foot drop. He apparently has been evaluated at the [facility] by orthopedics-and has been told that he is not a candidate for right knee replacement.   ALLERGIES:  No Known Allergies  PAST MEDICAL HISTORY: Past Medical History  Diagnosis Date  . Pneumonia   . Lymphoma     s/p partial gastrectomy  . Hypertension   . Hypercholesteremia   . Diabetes mellitus   . Gout   . Irregular heart beat   . Kidney stone     PAST SURGICAL HISTORY: Past Surgical History  Procedure Laterality Date  . Partial gastrectomy  1994    for lymphoma  . Hernia repair    . Joint replacement    . Back surgery    . Esophagogastroduodenoscopy N/A 10/28/2012    Procedure: ESOPHAGOGASTRODUODENOSCOPY (EGD);  Surgeon: Shirley Friar, MD;  Location: [facility];  Service: Endoscopy;  Laterality: N/A;    MEDICATIONS AT  HOME: Prior to Admission medications   Medication Sig Start Date End Date Taking? Authorizing Provider  acetaminophen (TYLENOL) 325 MG tablet Take 325 mg by mouth every 6 (six) hours as needed. For pain   Yes Historical Provider, MD  albuterol (PROVENTIL HFA;VENTOLIN HFA) 108 (90 BASE) MCG/ACT inhaler Inhale 2 puffs into the lungs every 6 (six) hours as needed for wheezing.   Yes Historical Provider, MD  amoxicillin (AMOXIL) 500 MG capsule Take 2,000 mg by mouth See admin instructions. 4 capsule prior to dental procedure   Yes Historical Provider, MD  Artificial Tear Ointment OINT Apply 1 application to eye 3 (three) times daily as needed (for dry eyes).   Yes Historical Provider, MD  aspirin EC 81 MG tablet Take 81 mg by mouth daily.   Yes Historical Provider, MD  bisacodyl (DULCOLAX) 5 MG EC tablet Take 5 mg by mouth daily as needed. For constipation   Yes Historical Provider, MD  carvedilol (COREG) 12.5 MG tablet Take 12.5 mg by mouth 2 (two) times daily with a meal.    Yes Historical Provider, MD  ciprofloxacin (CIPRO) 500 MG tablet Take 500 mg by mouth 2 (two) times daily.   Yes Historical Provider, MD  cyclobenzaprine (FLEXERIL) 10 MG tablet Take 10 mg by mouth 3 (three) times daily as needed. For muscle spasms   Yes Historical Provider, MD  cycloSPORINE (RESTASIS) 0.05 % ophthalmic emulsion Place 1 drop into both eyes 2 (two) times daily.   Yes Historical Provider, MD  ergocalciferol (VITAMIN D2) 50000 UNITS capsule  Take 50,000 Units by mouth every 14 (fourteen) days.   Yes Historical Provider, MD  furosemide (LASIX) 20 MG tablet Take 20 mg by mouth daily.    Yes Historical Provider, MD  gabapentin (NEURONTIN) 100 MG capsule Take 100 mg by mouth 2 (two) times daily.   Yes Historical Provider, MD  glipiZIDE (GLUCOTROL) 10 MG tablet Take 10 mg by mouth daily.     Yes Historical Provider, MD  HYDROcodone-acetaminophen (NORCO/VICODIN) 5-325 MG per tablet Take 1 tablet by mouth every 4 (four)  hours as needed for pain.   Yes Historical Provider, MD  hydroxypropyl methylcellulose (ISOPTO TEARS) 2.5 % ophthalmic solution Place 1 drop into both eyes 3 (three) times daily as needed (dfry eyes).   Yes Historical Provider, MD  lidocaine (LMX) 4 % cream Apply 1 application topically as needed (via atomizer).   Yes Historical Provider, MD  niacin 500 MG tablet Take 1,000 mg by mouth at bedtime.    Yes Historical Provider, MD  omeprazole (PRILOSEC) 20 MG capsule Take 20 mg by mouth 2 (two) times daily.   Yes Historical Provider, MD  pantoprazole (PROTONIX) 40 MG tablet Take 40 mg by mouth 2 (two) times daily.    Yes Historical Provider, MD  pioglitazone (ACTOS) 30 MG tablet Take 30 mg by mouth daily.    Yes Historical Provider, MD  polyethylene glycol (MIRALAX / GLYCOLAX) packet Take 17 g by mouth daily as needed. For constipation   Yes Historical Provider, MD  senna (SENOKOT) 8.6 MG tablet Take 2 tablets by mouth 2 (two) times daily.    Yes Historical Provider, MD  sertraline (ZOLOFT) 100 MG tablet Take 200 mg by mouth at bedtime.    Yes Historical Provider, MD  simvastatin (ZOCOR) 20 MG tablet Take 10 mg by mouth at bedtime.   Yes Historical Provider, MD  sulfamethoxazole-trimethoprim (BACTRIM DS) 800-160 MG per tablet Take 2 tablets by mouth 2 (two) times daily.   Yes Historical Provider, MD    FAMILY HISTORY: No family history of coronary artery disease. His brother has a pacemaker.  SOCIAL HISTORY:  reports that he has never smoked. He has never used smokeless tobacco. He reports that he does not drink alcohol or use illicit drugs.  REVIEW OF SYSTEMS:  Constitutional:   No  weight loss, night sweats,  Fevers, chills, fatigue.  HEENT:    No headaches, Difficulty swallowing,Tooth/dental problems,Sore throat,  No sneezing, itching, ear ache, nasal congestion, post nasal drip,   Cardio-vascular: No chest pain,  Orthopnea, PND, dizziness, palpitations  GI:  No heartburn,  indigestion, abdominal pain, nausea, vomiting, diarrhea, change in       bowel habits, loss of appetite  Resp: No shortness of breath with exertion or at rest.  No excess mucus, no productive cough, No non-productive cough,  No coughing up of blood.No change in color of mucus.No wheezing.No chest wall deformity  Skin:  no rash or lesions.  GU:  no dysuria, change in color of urine, no urgency or frequency.  No flank pain.  Musculoskeletal: No joint pain or swelling.  No decreased range of motion.  No back pain.  Psych: No change in mood or affect. No depression or anxiety.  No memory loss.   PHYSICAL EXAM: Blood pressure 156/82, pulse 65, temperature 97.8 F (36.6 C), temperature source Oral, resp. rate 16, height 6\' 2"  (1.88 m), weight 126 kg (277 lb 12.5 oz), SpO2 100.00%.  General appearance :Awake, alert, not in any distress. Chronically sick-looking Speech  Clear.  HEENT: Atraumatic and Normocephalic, pupils equally reactive to light and accomodation Neck: supple, no JVD but prominent neck veins. No cervical lymphadenopathy.  Chest:Good air entry bilaterally, with a few by basilar rales CVS: S1 S2 regular, no murmurs.  Abdomen: Bowel sounds present, very minimal tenderness in epigastrium, nondistended with positive shifting dullness,no gaurding, rigidity or rebound. Extremities: B/L Lower Ext shows 3-4+ edema all the way to the upper thigh, both legs are warm to touch Neurology: Awake alert, and oriented X 3, CN II-XII intact, Non focal Skin:No Rash Wounds:N/A  LABS ON ADMISSION:   Recent Labs  11/12/12 1022  NA 140  K 3.2*  CL 106  CO2 26  GLUCOSE 98  BUN 16  CREATININE 1.52*  CALCIUM 8.8    Recent Labs  11/12/12 1022  AST 32  ALT 19  ALKPHOS 143*  BILITOT 0.7  PROT 6.7  ALBUMIN 3.2*   No results found for this basename: LIPASE, AMYLASE,  in the last 72 hours  Recent Labs  11/12/12 1022  WBC 5.6  HGB 10.7*  HCT 32.4*  MCV 91.3  PLT 155   No  results found for this basename: CKTOTAL, CKMB, CKMBINDEX, TROPONINI,  in the last 72 hours No results found for this basename: DDIMER,  in the last 72 hours No components found with this basename: POCBNP,    RADIOLOGIC STUDIES ON ADMISSION: Dg Chest Port 1 View  11/12/2012  *RADIOLOGY REPORT*  Clinical Data: Shortness of breath.  PORTABLE CHEST - 1 VIEW  Comparison: PA and lateral chest 11/22/2011.  Findings: The patient is rotated on the study.  There is cardiomegaly without edema.  Lungs appear clear.  No pneumothorax or pleural fluid.  Right shoulder replacement noted.  Severe degenerative disease left shoulder is identified.  IMPRESSION: Cardiomegaly without acute disease.   Original Report Authenticated By: Holley Dexter, M.D.     ASSESSMENT AND PLAN: Present on Admission:  . CHF (congestive heart failure) exacerbation  - Suspect symptoms of anasarca likely due to congestive heart failure-he has no stigmata for cirrhosis and urinalysis is negative for proteinuria  - Check 2-D echocardiogram  - Continue beta blocker, given stage III chronic kidney disease and the need for diureses-would not start ACE inhibitor yet - Start Lasix at 80 mg IV 3 times a day  - Daily weights and strict I&O's  - Consult cardiology if diureses prooves to be difficult for echo shows significant systolic dysfunction   . CKD (chronic kidney disease) stage 3, GFR 30-59 ml/min - Creatinine close to usual baseline  - Monitor electrolytes closely while on diuretic  . Diabetes mellitus - Hold all oral hypoglycemic agents  - Place on SSI for now  - Monitor CBGs, if needed at low dose Lantus/levemir - Check A1c   . Gout - Stable  - Monitor for signs of flare-especially while on diuretics   . Osteoarthritis - Patient claims that he has severe osteoarthritis of his right knee-he sees a orthopedic doctor at [facility]-he has been told that he is not to operative candidate for right knee replacement-as he has  severe arthritis and a foot drop on the same side - His right knee on exam-does not appear to have any tenderness or erythema to suggest an infective process. - Will need to obtain a physical therapy consultation-while inpatient  Further plan will depend as patient's clinical course evolves and further radiologic and laboratory data become available. Patient will be monitored closely.   DVT Prophylaxis: -  Subcutaneous heparin  Code Status: - Full code  Total time spent for admission equals 45 minutes.  [facility] Pager [phone]  If 7PM-7AM, please contact night-coverage [email or web address] Password [facility] 11/12/2012, 4:55 PM

## 2012-11-12 NOTE — ED Notes (Signed)
Pt received to room 29. A/A/O, in no distress. C/O "hurting all over". Requesting pain med.

## 2012-11-12 NOTE — ED Notes (Signed)
Report called to ginger

## 2012-11-13 DIAGNOSIS — I059 Rheumatic mitral valve disease, unspecified: Secondary | ICD-10-CM

## 2012-11-13 LAB — BASIC METABOLIC PANEL
BUN: 18 mg/dL (ref 6–23)
CO2: 29 mEq/L (ref 19–32)
Calcium: 9.4 mg/dL (ref 8.4–10.5)
Creatinine, Ser: 1.58 mg/dL — ABNORMAL HIGH (ref 0.50–1.35)
GFR calc non Af Amer: 40 mL/min — ABNORMAL LOW (ref >90)
Glucose, Bld: 83 mg/dL (ref 70–99)
Sodium: 141 mEq/L (ref 135–145)

## 2012-11-13 LAB — GLUCOSE, CAPILLARY
Glucose-Capillary: 85 mg/dL (ref 70–99)
Glucose-Capillary: 105 mg/dL — ABNORMAL HIGH (ref 70–99)

## 2012-11-13 LAB — HEMOGLOBIN A1C
Hgb A1c MFr Bld: 6 % — ABNORMAL HIGH (ref <5.7)
Mean Plasma Glucose: 126 mg/dL — ABNORMAL HIGH (ref <117)

## 2012-11-13 LAB — URINE CULTURE

## 2012-11-13 MED ORDER — FUROSEMIDE 10 MG/ML IJ SOLN
60.0000 mg | Freq: Three times a day (TID) | INTRAMUSCULAR | Status: DC
  Administered 2012-11-13 – 2012-11-14 (×2): 60 mg via INTRAVENOUS
  Filled 2012-11-13 (×3): qty 6

## 2012-11-13 MED ORDER — ISOSORB DINITRATE-HYDRALAZINE 20-37.5 MG PO TABS
1.0000 | ORAL_TABLET | Freq: Two times a day (BID) | ORAL | Status: DC
  Administered 2012-11-13 – 2012-11-17 (×10): 1 via ORAL
  Filled 2012-11-13 (×13): qty 1

## 2012-11-13 MED ORDER — POTASSIUM CHLORIDE CRYS ER 20 MEQ PO TBCR
20.0000 meq | EXTENDED_RELEASE_TABLET | Freq: Two times a day (BID) | ORAL | Status: DC
  Administered 2012-11-13 – 2012-11-15 (×4): 20 meq via ORAL
  Filled 2012-11-13 (×5): qty 1

## 2012-11-13 NOTE — Evaluation (Signed)
Physical Therapy Evaluation Patient Details Name: [name] MRN: [id number] DOB: 1932-01-08 Today's Date: 11/13/2012 Time: 2536-6440 PT Time Calculation (min): 26 min  PT Assessment / Plan / Recommendation Clinical Impression  Acute PT indicated to maximize functional mobility prior to d/c home alone.    PT Assessment  Patient needs continued PT services    Follow Up Recommendations  Home health PT;Supervision - Intermittent    Does the patient have the potential to tolerate intense rehabilitation      Barriers to Discharge None      Equipment Recommendations  None recommended by PT    Recommendations for Other Services     Frequency Min 3X/week    Precautions / Restrictions Precautions Precautions: Fall   Pertinent Vitals/Pain 0/10      Mobility  Bed Mobility Bed Mobility: Supine to Sit Supine to Sit: 6: Modified independent (Device/Increase time) Transfers Transfers: Sit to Stand;Stand to Sit Sit to Stand: 4: Min guard;From bed;With upper extremity assist Stand to Sit: 4: Min guard;To chair/3-in-1;With armrests Details for Transfer Assistance: verbal cues for hand placement Ambulation/Gait Ambulation/Gait Assistance: 4: Min guard Ambulation Distance (Feet): 100 Feet Assistive device: Rolling walker Ambulation/Gait Assistance Details: knee brace on R Gait Pattern: Step-through pattern Gait velocity: decreased, DOE noted    Exercises     PT Diagnosis: Difficulty walking  PT Problem List: Decreased activity tolerance;Decreased mobility;Cardiopulmonary status limiting activity;Decreased safety awareness PT Treatment Interventions: Gait training;Functional mobility training;Therapeutic activities;Patient/family education;Therapeutic exercise;Balance training   PT Goals Acute Rehab PT Goals PT Goal Formulation: With patient Time For Goal Achievement: 11/20/12 Potential to Achieve Goals: Good Pt will go Sit to Stand: with modified independence PT  Goal: Sit to Stand - Progress: Goal set today Pt will go Stand to Sit: with modified independence PT Goal: Stand to Sit - Progress: Goal set today Pt will Transfer Bed to Chair/Chair to Bed: with modified independence PT Transfer Goal: Bed to Chair/Chair to Bed - Progress: Goal set today Pt will Ambulate: >150 feet;with supervision;with rolling walker PT Goal: Ambulate - Progress: Goal set today  Visit Information  Last PT Received On: 11/13/12 Assistance Needed: +1    Subjective Data  Subjective: "I can do it all by myself." Patient Stated Goal: home   Prior Functioning  Home Living Lives With: Alone Available Help at Discharge: Family;Available PRN/intermittently Type of Home: House Home Access: Ramped entrance Home Layout: One level Bathroom Accessibility: Yes How Accessible: Accessible via walker Home Adaptive Equipment: Walker - four wheeled Prior Function Level of Independence: Independent Able to Take Stairs?: No Driving: Yes Vocation: Retired Musician: No difficulties    Copywriter, advertising Overall Cognitive Status: Appears within functional limits for tasks assessed/performed Arousal/Alertness: Awake/alert Orientation Level: Appears intact for tasks assessed Behavior During Session: [facility] for tasks performed    Extremity/Trunk Assessment     Balance    End of Session PT - End of Session Equipment Utilized During Treatment: Gait belt Activity Tolerance: Patient limited by fatigue Patient left: in chair;with call bell/phone within reach Nurse Communication: Mobility status  GP     Ilda Foil 11/13/2012, 1:31 PM  Aida Raider, PT  Office # [phone] Pager [phone]

## 2012-11-13 NOTE — Progress Notes (Signed)
PATIENT DETAILS Name: [name] Age: 79 y.o. Sex: male Date of Birth: [birth year] Admit Date: 11/12/2012 Admitting Physician Dewayne Shorter Levora Dredge, MD [id number] Minda Meo, MD  Subjective: No major issues overnight  Assessment/Plan: Active Problems: CHF (congestive heart failure) exacerbation  - Suspect symptoms of anasarca likely due to congestive heart failure-he has no stigmata for cirrhosis and urinalysis is negative for proteinuria  -await 2-D echocardiogram  -Continue beta blocker -given stage III chronic kidney disease and the need for diureses-would not start ACE inhibitor yet-will try BiDIL -is in negative balance-weight down to 119.2 -continue diuretics-may need to decrease dose in next 24 hours  Uncontrolled HTN -continue with Lasix, Coreg -add BiDil  CKD (chronic kidney disease) stage 3, GFR 30-59 ml/min  - Creatinine close to usual baseline  - Monitor electrolytes closely while on diuretic  Diabetes mellitus  - continue to holdl oral hypoglycemic agents  - c/w  SSI for now  - Monitor CBGs, if needed at low dose Lantus/levemir  -  A1c 6.0  Gout  - Stable  - Monitor for signs of flare-especially while on diuretics   Osteoarthritis  - Patient claims that he has severe osteoarthritis of his right knee-he sees a orthopedic doctor at [facility] system-he has been told that he is not to operative candidate for right knee replacement-as he has severe arthritis and a foot drop on the same side  - His right knee on exam-does not appear to have any tenderness or erythema to suggest an infective process -X Ray right knee-show chronic changes and unchanged effusion  Disposition: Remain inpatient  DVT Prophylaxis: Prophylactic Heparin  Code Status: Full code or DNR  Procedures:  None  CONSULTS:  None  PHYSICAL EXAM: Vital signs in last 24 hours: Filed Vitals:   11/12/12 1546 11/12/12 1620 11/12/12 2100 11/13/12 0422  BP: 156/82 193/86 166/79 181/110   Pulse: 65 62 65 67  Temp:  98.6 F (37 C) 98.2 F (36.8 C) 97.4 F (36.3 C)  TempSrc:  Oral Oral Oral  Resp: 16 18 20 18   Height:  6\' 2"  (1.88 m)    Weight:  126 kg (277 lb 12.5 oz)  119.2 kg (262 lb 12.6 oz)  SpO2: 100% 98% 100% 100%    Weight change:  Body mass index is 33.73 kg/(m^2).   Gen Exam: Awake and alert with clear speech.   Neck: Supple, No JVD.   Chest: B/L Clear.   CVS: S1 S2 Regular, no murmurs.  Abdomen: soft, BS +, non tender, large ventral hernia in place Extremities: 3-4 + edema, lower extremities warm to touch. Neurologic: Non Focal.   Skin: No Rash.   Wounds: N/A.    Intake/Output from previous day:  Intake/Output Summary (Last 24 hours) at 11/13/12 1201 Last data filed at 11/13/12 1100  Gross per 24 hour  Intake    840 ml  Output   6310 ml  Net  -5470 ml     LAB RESULTS: CBC  Recent Labs Lab 11/12/12 1022 11/12/12 1758  WBC 5.6 5.9  HGB 10.7* 11.9*  HCT 32.4* 35.6*  PLT 155 163  MCV 91.3 90.6  MCH 30.1 30.3  MCHC 33.0 33.4  RDW 13.6 13.6    Chemistries   Recent Labs Lab 11/12/12 1022 11/12/12 1758 11/13/12 0445  NA 140  --  141  K 3.2*  --  3.3*  CL 106  --  101  CO2 26  --  29  GLUCOSE 98  --  83  BUN 16  --  18  CREATININE 1.52* 1.40* 1.58*  CALCIUM 8.8  --  9.4    CBG:  Recent Labs Lab 11/12/12 1326 11/12/12 2221 11/13/12 0754  GLUCAP 66* 171* 85    GFR Estimated Creatinine Clearance: 52.9 ml/min (by C-G formula based on Cr of 1.58).  Coagulation profile No results found for this basename: INR, PROTIME,  in the last 168 hours  Cardiac Enzymes  Recent Labs Lab 11/12/12 1758 11/12/12 2201 11/13/12 0445  TROPONINI <0.30 <0.30 <0.30    No components found with this basename: POCBNP,  No results found for this basename: DDIMER,  in the last 72 hours  Recent Labs  11/12/12 1758  HGBA1C 6.0*    Recent Labs  11/12/12 1758  CHOL 87  HDL 38*  LDLCALC 37  TRIG 60  CHOLHDL 2.3    Recent  Labs  11/12/12 1758  TSH 3.183   No results found for this basename: VITAMINB12, FOLATE, FERRITIN, TIBC, IRON, RETICCTPCT,  in the last 72 hours No results found for this basename: LIPASE, AMYLASE,  in the last 72 hours  Urine Studies No results found for this basename: UACOL, UAPR, USPG, UPH, UTP, UGL, UKET, UBIL, UHGB, UNIT, UROB, ULEU, UEPI, UWBC, URBC, UBAC, CAST, CRYS, UCOM, BILUA,  in the last 72 hours  MICROBIOLOGY: No results found for this or any previous visit (from the past 240 hour(s)).  RADIOLOGY STUDIES/RESULTS: Dg Knee 1-2 Views Right  11/12/2012  *RADIOLOGY REPORT*  Clinical Data: Right knee pain and swelling.  Arthritis.  RIGHT KNEE - 1-2 VIEW  Comparison: 09/14/2012  Findings: No evidence of acute fracture or dislocation.  A moderate to large knee joint effusion is again seen, without significant change.  Osteoarthritis is again seen with severe medial compartment joint space narrowing causing tibia varus.  This is unchanged in appearance since previous study.  Peripheral vascular calcification also noted.  IMPRESSION:  1.  Stable moderate to large knee joint effusion. 2.  Stable osteoarthritis, with severe medial compartment joint space narrowing and secondary tibial varus.   Original Report Authenticated By: Myles Rosenthal, M.D.    US Abdomen Limited  11/12/2012  *RADIOLOGY REPORT*  Clinical Data: Abdominal distention.  Evaluate for ascites.  LIMITED ABDOMEN ULTRASOUND FOR ASCITES  Technique:  Limited ultrasound survey for ascites was performed in all four abdominal quadrants.  Comparison:  None.  Findings: Survey of all four abdominal quadrants shows no evidence of ascites.  IMPRESSION: No evidence of ascites.   Original Report Authenticated By: Myles Rosenthal, M.D.    Dg Chest Port 1 View  11/12/2012  *RADIOLOGY REPORT*  Clinical Data: Shortness of breath.  PORTABLE CHEST - 1 VIEW  Comparison: PA and lateral chest 11/22/2011.  Findings: The patient is rotated on the study.  There  is cardiomegaly without edema.  Lungs appear clear.  No pneumothorax or pleural fluid.  Right shoulder replacement noted.  Severe degenerative disease left shoulder is identified.  IMPRESSION: Cardiomegaly without acute disease.   Original Report Authenticated By: Holley Dexter, M.D.     MEDICATIONS: Scheduled Meds: . aspirin EC  81 mg Oral Daily  . carvedilol  12.5 mg Oral BID WC  . cycloSPORINE  1 drop Both Eyes BID  . ergocalciferol  50,000 Units Oral Q14 Days  . furosemide  80 mg Intravenous Q8H  . gabapentin  100 mg Oral BID  . heparin  5,000 Units Subcutaneous Q8H  . insulin aspart  0-15 Units Subcutaneous TID WC  .  isosorbide-hydrALAZINE  1 tablet Oral BID  . niacin  1,000 mg Oral QHS  . pantoprazole  40 mg Oral Daily  . polyethylene glycol  17 g Oral Daily  . potassium chloride  20 mEq Oral BID  . sertraline  200 mg Oral QHS  . simvastatin  10 mg Oral QHS  . sodium chloride  3 mL Intravenous Q12H   Continuous Infusions:  PRN Meds:.sodium chloride, acetaminophen, acetaminophen, Artificial Tear Ointment, bisacodyl, HYDROcodone-acetaminophen, hydroxypropyl methylcellulose, ondansetron (ZOFRAN) IV, sodium chloride  Antibiotics: Anti-infectives   None       Jeoffrey Massed, MD  [facility] Pager:[phone]  If 7PM-7AM, please contact night-coverage [email or web address] Password [email or web address] 11/13/2012, 12:01 PM   LOS: 1 day

## 2012-11-13 NOTE — Progress Notes (Signed)
PT had an elevated bp of 180/110. RN paged MD on call Claiborne Billings and is awaiting further orders.

## 2012-11-13 NOTE — Progress Notes (Signed)
  Echocardiogram 2D Echocardiogram has been performed.  [name] A 11/13/2012, 3:14 PM

## 2012-11-13 NOTE — Progress Notes (Signed)
Neurotin and inhaler were sent home with granddaughter.

## 2012-11-14 LAB — GLUCOSE, CAPILLARY
Glucose-Capillary: 96 mg/dL (ref 70–99)
Glucose-Capillary: 161 mg/dL — ABNORMAL HIGH (ref 70–99)

## 2012-11-14 LAB — BASIC METABOLIC PANEL
CO2: 30 mEq/L (ref 19–32)
Chloride: 98 mEq/L (ref 96–112)
Creatinine, Ser: 1.9 mg/dL — ABNORMAL HIGH (ref 0.50–1.35)
Glucose, Bld: 117 mg/dL — ABNORMAL HIGH (ref 70–99)

## 2012-11-14 LAB — PRO B NATRIURETIC PEPTIDE: Pro B Natriuretic peptide (BNP): 4368 pg/mL — ABNORMAL HIGH (ref 0–450)

## 2012-11-14 MED ORDER — HYDROCODONE-ACETAMINOPHEN 5-325 MG PO TABS
1.0000 | ORAL_TABLET | Freq: Four times a day (QID) | ORAL | Status: DC | PRN
  Administered 2012-11-14: 1 via ORAL
  Administered 2012-11-14 – 2012-11-17 (×8): 2 via ORAL
  Administered 2012-11-17: 1 via ORAL
  Administered 2012-11-17 – 2012-11-19 (×5): 2 via ORAL
  Administered 2012-11-20 (×3): 1 via ORAL
  Administered 2012-11-21: 2 via ORAL
  Administered 2012-11-21 – 2012-11-24 (×10): 1 via ORAL
  Administered 2012-11-24 – 2012-11-25 (×2): 2 via ORAL
  Filled 2012-11-14 (×3): qty 1
  Filled 2012-11-14 (×2): qty 2
  Filled 2012-11-14 (×3): qty 1
  Filled 2012-11-14 (×3): qty 2
  Filled 2012-11-14 (×4): qty 1
  Filled 2012-11-14 (×2): qty 2
  Filled 2012-11-14 (×2): qty 1
  Filled 2012-11-14 (×2): qty 2
  Filled 2012-11-14: qty 1
  Filled 2012-11-14 (×3): qty 2
  Filled 2012-11-14 (×3): qty 1
  Filled 2012-11-14: qty 2
  Filled 2012-11-14: qty 1
  Filled 2012-11-14 (×2): qty 2

## 2012-11-14 MED ORDER — FUROSEMIDE 10 MG/ML IJ SOLN
60.0000 mg | Freq: Two times a day (BID) | INTRAMUSCULAR | Status: DC
  Administered 2012-11-14 – 2012-11-15 (×2): 60 mg via INTRAVENOUS
  Filled 2012-11-14 (×2): qty 6

## 2012-11-14 NOTE — Progress Notes (Signed)
PATIENT DETAILS Name: [name] Age: 79 y.o. Sex: male Date of Birth: 05-14-1932 Admit Date: 11/12/2012 Admitting Physician Dewayne Shorter Levora Dredge, MD [id number] Minda Meo, MD  Subjective: No major issues overnight-no major complatins  Assessment/Plan: Active Problems: CHF (congestive heart failure) exacerbation  - Suspect symptoms of anasarca likely due to congestive heart failure-he has no stigmata for cirrhosis and urinalysis is negative for proteinuria  - 2-D echocardiogram -shows slightly decreased Systolic function with EF around 45 percent -Continue beta blocker -given stage III chronic kidney disease and the need for diureses-would not start ACE inhibitor-on BiDIL -is in negative balance-weight down to 119.2-unchanged from yesterday-and has decreasing leg edema as well. -continue diuretics-but decrease dosing to BID  Uncontrolled HTN -better control -continue  Lasix, Coreg and BiDil  Acute on CKD (chronic kidney disease) stage 3, GFR 30-59 ml/min  - Creatinine slightly more that usual baseline -likely 2/2 diuretic therapy-will decrease Lasix to 60 mg BID - Monitor electrolytes closely while on diuretic  Diabetes mellitus  - continue to holdl oral hypoglycemic agents  - c/w  SSI for now  - Monitor CBGs, if needed at low dose Lantus/levemir  -  A1c 6.0  Gout  - Stable  - Monitor for signs of flare-especially while on diuretics   Osteoarthritis  - Patient claims that he has severe osteoarthritis of his right knee-he sees a orthopedic doctor at [place] [facility]-he has been told that he is not to operative candidate for right knee replacement-as he has severe arthritis and a foot drop on the same side  - His right knee on exam-does not appear to have any tenderness or erythema to suggest an infective process -X Ray right knee-show chronic changes and unchanged effusion  Disposition: Remain inpatient  DVT Prophylaxis: Prophylactic Heparin  Code Status: Full code    Procedures:  None  CONSULTS:  None  PHYSICAL EXAM: Vital signs in last 24 hours: Filed Vitals:   11/13/12 0422 11/13/12 1342 11/13/12 2107 11/14/12 0529  BP: 181/110 143/75 161/79 146/68  Pulse: 67 76 80 83  Temp: 97.4 F (36.3 C) 98.3 F (36.8 C) 98.1 F (36.7 C) 98.5 F (36.9 C)  TempSrc: Oral Oral Oral Oral  Resp: 18 18 18 18   Height:      Weight: 119.2 kg (262 lb 12.6 oz)   119.2 kg (262 lb 12.6 oz)  SpO2: 100% 93% 92% 94%    Weight change: -6.9 kg (-15 lb 3.4 oz) Body mass index is 33.73 kg/(m^2).   Gen Exam: Awake and alert with clear speech.   Neck: Supple, No JVD.   Chest: B/L Clear.  No rales CVS: S1 S2 Regular, no murmurs.  Abdomen: soft, BS +, non tender, large ventral hernia in place Extremities: 2-3 + edema, lower extremities warm to touch. Neurologic: Non Focal.   Skin: No Rash.   Wounds: N/A.    Intake/Output from previous day:  Intake/Output Summary (Last 24 hours) at 11/14/12 1051 Last data filed at 11/14/12 0530  Gross per 24 hour  Intake    480 ml  Output   2000 ml  Net  -1520 ml     LAB RESULTS: CBC  Recent Labs Lab 11/12/12 1022 11/12/12 1758  WBC 5.6 5.9  HGB 10.7* 11.9*  HCT 32.4* 35.6*  PLT 155 163  MCV 91.3 90.6  MCH 30.1 30.3  MCHC 33.0 33.4  RDW 13.6 13.6    Chemistries   Recent Labs Lab 11/12/12 1022 11/12/12 1758 11/13/12 0445 11/14/12 0510  NA 140  --  141 139  K 3.2*  --  3.3* 3.6  CL 106  --  101 98  CO2 26  --  29 30  GLUCOSE 98  --  83 117*  BUN 16  --  18 21  CREATININE 1.52* 1.40* 1.58* 1.90*  CALCIUM 8.8  --  9.4 9.1    CBG:  Recent Labs Lab 11/13/12 0754 11/13/12 1231 11/13/12 1726 11/13/12 2104 11/14/12 0802  GLUCAP 85 105* 119* 129* 96    GFR Estimated Creatinine Clearance: 44 ml/min (by C-G formula based on Cr of 1.9).  Coagulation profile No results found for this basename: INR, PROTIME,  in the last 168 hours  Cardiac Enzymes  Recent Labs Lab 11/12/12 1758  11/12/12 2201 11/13/12 0445  TROPONINI <0.30 <0.30 <0.30    No components found with this basename: POCBNP,  No results found for this basename: DDIMER,  in the last 72 hours  Recent Labs  11/12/12 1758  HGBA1C 6.0*    Recent Labs  11/12/12 1758  CHOL 87  HDL 38*  LDLCALC 37  TRIG 60  CHOLHDL 2.3    Recent Labs  11/12/12 1758  TSH 3.183   No results found for this basename: VITAMINB12, FOLATE, FERRITIN, TIBC, IRON, RETICCTPCT,  in the last 72 hours No results found for this basename: LIPASE, AMYLASE,  in the last 72 hours  Urine Studies No results found for this basename: UACOL, UAPR, USPG, UPH, UTP, UGL, UKET, UBIL, UHGB, UNIT, UROB, ULEU, UEPI, UWBC, URBC, UBAC, CAST, CRYS, UCOM, BILUA,  in the last 72 hours  MICROBIOLOGY: Recent Results (from the past 240 hour(s))  URINE CULTURE     Status: None   Collection Time    11/12/12  1:42 PM      Result Value Range Status   Specimen Description URINE, CLEAN CATCH   Final   Special Requests NONE   Final   Culture  Setup Time 11/12/2012 14:05   Final   Colony Count NO GROWTH   Final   Culture NO GROWTH   Final   Report Status 11/13/2012 FINAL   Final    RADIOLOGY STUDIES/RESULTS: Dg Knee 1-2 Views Right  11/12/2012  *RADIOLOGY REPORT*  Clinical Data: Right knee pain and swelling.  Arthritis.  RIGHT KNEE - 1-2 VIEW  Comparison: 09/14/2012  Findings: No evidence of acute fracture or dislocation.  A moderate to large knee joint effusion is again seen, without significant change.  Osteoarthritis is again seen with severe medial compartment joint space narrowing causing tibia varus.  This is unchanged in appearance since previous study.  Peripheral vascular calcification also noted.  IMPRESSION:  1.  Stable moderate to large knee joint effusion. 2.  Stable osteoarthritis, with severe medial compartment joint space narrowing and secondary tibial varus.   Original Report Authenticated By: Myles Rosenthal, M.D.    US Abdomen  Limited  11/12/2012  *RADIOLOGY REPORT*  Clinical Data: Abdominal distention.  Evaluate for ascites.  LIMITED ABDOMEN ULTRASOUND FOR ASCITES  Technique:  Limited ultrasound survey for ascites was performed in all four abdominal quadrants.  Comparison:  None.  Findings: Survey of all four abdominal quadrants shows no evidence of ascites.  IMPRESSION: No evidence of ascites.   Original Report Authenticated By: Myles Rosenthal, M.D.    Dg Chest Port 1 View  11/12/2012  *RADIOLOGY REPORT*  Clinical Data: Shortness of breath.  PORTABLE CHEST - 1 VIEW  Comparison: PA and lateral chest 11/22/2011.  Findings: The  patient is rotated on the study.  There is cardiomegaly without edema.  Lungs appear clear.  No pneumothorax or pleural fluid.  Right shoulder replacement noted.  Severe degenerative disease left shoulder is identified.  IMPRESSION: Cardiomegaly without acute disease.   Original Report Authenticated By: Holley Dexter, M.D.     MEDICATIONS: Scheduled Meds: . aspirin EC  81 mg Oral Daily  . carvedilol  12.5 mg Oral BID WC  . cycloSPORINE  1 drop Both Eyes BID  . ergocalciferol  50,000 Units Oral Q14 Days  . furosemide  60 mg Intravenous Q12H  . gabapentin  100 mg Oral BID  . heparin  5,000 Units Subcutaneous Q8H  . insulin aspart  0-15 Units Subcutaneous TID WC  . isosorbide-hydrALAZINE  1 tablet Oral BID  . niacin  1,000 mg Oral QHS  . pantoprazole  40 mg Oral Daily  . polyethylene glycol  17 g Oral Daily  . potassium chloride  20 mEq Oral BID  . sertraline  200 mg Oral QHS  . simvastatin  10 mg Oral QHS  . sodium chloride  3 mL Intravenous Q12H   Continuous Infusions:  PRN Meds:.sodium chloride, acetaminophen, acetaminophen, Artificial Tear Ointment, bisacodyl, HYDROcodone-acetaminophen, hydroxypropyl methylcellulose, ondansetron (ZOFRAN) IV, sodium chloride  Antibiotics: Anti-infectives   None       Jeoffrey Massed, MD  [facility] Pager:[phone]  If  7PM-7AM, please contact night-coverage [email or web address] Password [facility] 11/14/2012, 10:51 AM   LOS: 2 days

## 2012-11-15 ENCOUNTER — Encounter (HOSPITAL_COMMUNITY): Payer: Self-pay | Admitting: Physician Assistant

## 2012-11-15 LAB — BASIC METABOLIC PANEL
BUN: 26 mg/dL — ABNORMAL HIGH (ref 6–23)
Creatinine, Ser: 2.32 mg/dL — ABNORMAL HIGH (ref 0.50–1.35)
GFR calc Af Amer: 29 mL/min — ABNORMAL LOW (ref >90)
GFR calc non Af Amer: 25 mL/min — ABNORMAL LOW (ref >90)
Potassium: 4.2 mEq/L (ref 3.5–5.1)

## 2012-11-15 LAB — GLUCOSE, CAPILLARY
Glucose-Capillary: 164 mg/dL — ABNORMAL HIGH (ref 70–99)
Glucose-Capillary: 171 mg/dL — ABNORMAL HIGH (ref 70–99)
Glucose-Capillary: 167 mg/dL — ABNORMAL HIGH (ref 70–99)

## 2012-11-15 MED ORDER — ZOLPIDEM TARTRATE 5 MG PO TABS
5.0000 mg | ORAL_TABLET | Freq: Once | ORAL | Status: AC
  Administered 2012-11-16: 5 mg via ORAL
  Filled 2012-11-15: qty 1

## 2012-11-15 MED ORDER — SODIUM CHLORIDE 0.9 % IV SOLN
INTRAVENOUS | Status: DC
  Administered 2012-11-18: 20 mL/h via INTRAVENOUS
  Administered 2012-11-20: 250 mL via INTRAVENOUS
  Administered 2012-11-22: 08:00:00 via INTRAVENOUS

## 2012-11-15 NOTE — Progress Notes (Signed)
PATIENT DETAILS Name: [name] Age: 79 y.o. Sex: male Date of Birth: 28-Oct-1931 Admit Date: 11/12/2012 Admitting Physician Dewayne Shorter Levora Dredge, MD [id number] Minda Meo, MD  Subjective: Patient with no complaints.  Swelling decreased.    Assessment/Plan: Active Problems:  CHF (congestive heart failure) exacerbation  - Suspect symptoms of anasarca likely due to congestive heart failure-he has no stigmata for cirrhosis and urinalysis is negative for proteinuria  - 2-D echocardiogram -shows slightly decreased Systolic function with EF around 45 percent - Continue beta blocker - given stage III chronic kidney disease and the need for diureses-would not start ACE inhibitor-on BiDIL - Diuresed 7.1 liters this admission. - Cardiology consulted due to need for further diuresis unfortunately creatinine is rising significantly.  Kidneys unable to tolerate current diuretic therapy.  Acute on CKD (chronic kidney disease) stage 3, GFR 30-59 ml/min  - baseline creatinine 1.6 - 1.7 - creatinine increased to 2.32  On 3/17 due to diuretic therapy.  Will hold lasix today. - Monitor electrolytes closely while on diuretic  Uncontrolled HTN -better control -continue Coreg and BiDil.  Holding lasix for now.  Diabetes mellitus  - continue to hold oral hypoglycemic agents  - c/w  SSI for now  - CBGs within reasonable range. -  A1c 6.0  Gout  - Stable  - Monitor for signs of flare-especially while on diuretics   Osteoarthritis  - Patient claims that he has severe osteoarthritis of his right knee-he sees a orthopedic doctor at [facility]-he has been told that he is not to operative candidate for right knee replacement-as he has severe arthritis and a foot drop on the same side  - His right knee on exam-does not appear to have any tenderness or erythema to suggest an infective process -X Ray right knee-show chronic changes and unchanged/stable moderate effusion  Disposition: Remain  inpatient.  [facility] Home Health RN and PT ordered-when able to discharge  DVT Prophylaxis: Prophylactic Heparin  Code Status: Full code   Procedures:  2D echo - Left ventricle: The cavity size was normal. There was focal basal hypertrophy. Systolic function was mildly reduced. The estimated ejection fraction was in the range of 45% to 50%. - Mitral valve: Mild regurgitation. - Left atrium: The atrium was mildly dilated. - Right ventricle: The cavity size was mildly dilated. - Right atrium: The atrium was mildly dilated.    CONSULTS:  cardiology  PHYSICAL EXAM: Vital signs in last 24 hours: Filed Vitals:   11/14/12 1453 11/14/12 2126 11/15/12 0546 11/15/12 0818  BP: 119/73 129/75 129/66 160/94  Pulse: 77 62 71 80  Temp: 98 F (36.7 C) 97.5 F (36.4 C) 97.5 F (36.4 C)   TempSrc: Oral Oral Oral   Resp: 18 18 18    Height:      Weight:   119.2 kg (262 lb 12.6 oz)   SpO2: 100% 100% 98%     Weight change: 0 kg (0 lb) Body mass index is 33.73 kg/(m^2).   Gen Exam: Awake and alert with clear speech.   Neck: Supple, No JVD.   Chest: B/L Clear.  No w/c/r, decreased breath sounds CVS: S1 S2 Regular, no murmurs.  Abdomen: soft, BS +, non tender, large ventral hernia in place Extremities: 2 + edema that extends above his knees, lower extremities warm to touch. Neurologic: Non Focal.   Skin: No Rash.   Wounds: N/A.    Intake/Output from previous day:  Intake/Output Summary (Last 24 hours) at 11/15/12 1025 Last data filed  at 11/15/12 0600  Gross per 24 hour  Intake    118 ml  Output   1200 ml  Net  -1082 ml     LAB RESULTS: CBC  Recent Labs Lab 11/12/12 1022 11/12/12 1758  WBC 5.6 5.9  HGB 10.7* 11.9*  HCT 32.4* 35.6*  PLT 155 163  MCV 91.3 90.6  MCH 30.1 30.3  MCHC 33.0 33.4  RDW 13.6 13.6    Chemistries   Recent Labs Lab 11/12/12 1022 11/12/12 1758 11/13/12 0445 11/14/12 0510 11/15/12 0625  NA 140  --  141 139 139  K 3.2*  --  3.3* 3.6 4.2  CL  106  --  101 98 99  CO2 26  --  29 30 32  GLUCOSE 98  --  83 117* 117*  BUN 16  --  18 21 26*  CREATININE 1.52* 1.40* 1.58* 1.90* 2.32*  CALCIUM 8.8  --  9.4 9.1 8.5    CBG:  Recent Labs Lab 11/14/12 0802 11/14/12 1236 11/14/12 1714 11/14/12 2124 11/15/12 0749  GLUCAP 96 161* 162* 143* 106*     Cardiac Enzymes  Recent Labs Lab 11/12/12 1758 11/12/12 2201 11/13/12 0445  TROPONINI <0.30 <0.30 <0.30     Recent Labs  11/12/12 1758  HGBA1C 6.0*    Recent Labs  11/12/12 1758  CHOL 87  HDL 38*  LDLCALC 37  TRIG 60  CHOLHDL 2.3    Recent Labs  11/12/12 1758  TSH 3.183    MICROBIOLOGY: Recent Results (from the past 240 hour(s))  URINE CULTURE     Status: None   Collection Time    11/12/12  1:42 PM      Result Value Range Status   Specimen Description URINE, CLEAN CATCH   Final   Special Requests NONE   Final   Culture  Setup Time 11/12/2012 14:05   Final   Colony Count NO GROWTH   Final   Culture NO GROWTH   Final   Report Status 11/13/2012 FINAL   Final    RADIOLOGY STUDIES/RESULTS: Dg Knee 1-2 Views Right  11/12/2012  *RADIOLOGY REPORT*  Clinical Data: Right knee pain and swelling.  Arthritis.  RIGHT KNEE - 1-2 VIEW  Comparison: 09/14/2012  Findings: No evidence of acute fracture or dislocation.  A moderate to large knee joint effusion is again seen, without significant change.  Osteoarthritis is again seen with severe medial compartment joint space narrowing causing tibia varus.  This is unchanged in appearance since previous study.  Peripheral vascular calcification also noted.  IMPRESSION:  1.  Stable moderate to large knee joint effusion. 2.  Stable osteoarthritis, with severe medial compartment joint space narrowing and secondary tibial varus.   Original Report Authenticated By: Myles Rosenthal, M.D.    US Abdomen Limited  11/12/2012  *RADIOLOGY REPORT*  Clinical Data: Abdominal distention.  Evaluate for ascites.  LIMITED ABDOMEN ULTRASOUND FOR  ASCITES  Technique:  Limited ultrasound survey for ascites was performed in all four abdominal quadrants.  Comparison:  None.  Findings: Survey of all four abdominal quadrants shows no evidence of ascites.  IMPRESSION: No evidence of ascites.   Original Report Authenticated By: Myles Rosenthal, M.D.    Dg Chest Port 1 View  11/12/2012  *RADIOLOGY REPORT*  Clinical Data: Shortness of breath.  PORTABLE CHEST - 1 VIEW  Comparison: PA and lateral chest 11/22/2011.  Findings: The patient is rotated on the study.  There is cardiomegaly without edema.  Lungs appear clear.  No  pneumothorax or pleural fluid.  Right shoulder replacement noted.  Severe degenerative disease left shoulder is identified.  IMPRESSION: Cardiomegaly without acute disease.   Original Report Authenticated By: Holley Dexter, M.D.     MEDICATIONS: Scheduled Meds: . aspirin EC  81 mg Oral Daily  . carvedilol  12.5 mg Oral BID WC  . cycloSPORINE  1 drop Both Eyes BID  . ergocalciferol  50,000 Units Oral Q14 Days  . gabapentin  100 mg Oral BID  . heparin  5,000 Units Subcutaneous Q8H  . insulin aspart  0-15 Units Subcutaneous TID WC  . isosorbide-hydrALAZINE  1 tablet Oral BID  . niacin  1,000 mg Oral QHS  . pantoprazole  40 mg Oral Daily  . polyethylene glycol  17 g Oral Daily  . sertraline  200 mg Oral QHS  . simvastatin  10 mg Oral QHS  . sodium chloride  3 mL Intravenous Q12H   Continuous Infusions:  PRN Meds:.sodium chloride, acetaminophen, acetaminophen, Artificial Tear Ointment, bisacodyl, HYDROcodone-acetaminophen, hydroxypropyl methylcellulose, ondansetron (ZOFRAN) IV, sodium chloride  Antibiotics: Anti-infectives   None       [place]ley [place] [facility] Pager:[phone]  If 7PM-7AM, please contact night-coverage [email or web address] Password [email or web address] 11/15/2012, 10:25 AM   LOS: 3 days   Attending Patient seen and examined, agree with the assessment and plans as outlined above. Creatinine further  increased today-still with edema. Will get Cards to see today, hold lasix for today, receheck lytes in am.  S Ghimire

## 2012-11-15 NOTE — Progress Notes (Signed)
Physical Therapy Treatment Patient Details Name: [name] MRN: [id number] DOB: 16-Oct-1931 Today's Date: 11/15/2012 Time: 4098-1191 PT Time Calculation (min): 23 min  PT Assessment / Plan / Recommendation Comments on Treatment Session  Pt adm with CHF. Pt making good progress.    Follow Up Recommendations  Home health PT;Supervision - Intermittent     Does the patient have the potential to tolerate intense rehabilitation     Barriers to Discharge        Equipment Recommendations  None recommended by PT    Recommendations for Other Services    Frequency Min 3X/week   Plan Discharge plan remains appropriate;Frequency remains appropriate    Precautions / Restrictions Precautions Precautions: Fall Precaution Comments: Wears a velcro brace on rt knee due to OA.   Pertinent Vitals/Pain SaO2 98% on RA with amb    Mobility  Transfers Sit to Stand: 5: Supervision;With upper extremity assist;From bed Stand to Sit: 5: Supervision;With upper extremity assist;To bed Details for Transfer Assistance: No cues needed. Incr time. Ambulation/Gait Ambulation/Gait Assistance: 4: Min guard Ambulation Distance (Feet): 150 Feet Assistive device: Rolling walker Ambulation/Gait Assistance Details: Pt with steppage gait on rt due to foot drop.  Has AFO at home that he uses. Gait Pattern: Step-through pattern;Decreased stride length;Right steppage;Right genu recurvatum Gait velocity: decr    Exercises     PT Diagnosis:    PT Problem List:   PT Treatment Interventions:     PT Goals Acute Rehab PT Goals PT Goal: Sit to Stand - Progress: Progressing toward goal PT Goal: Stand to Sit - Progress: Progressing toward goal PT Transfer Goal: Bed to Chair/Chair to Bed - Progress: Progressing toward goal PT Goal: Ambulate - Progress: Progressing toward goal  Visit Information  Last PT Received On: 11/15/12    Subjective Data  Subjective: "I have to take my time," pt stated about getting  up by himself.   Cognition  Cognition Overall Cognitive Status: Appears within functional limits for tasks assessed/performed Arousal/Alertness: Awake/alert Orientation Level: Appears intact for tasks assessed Behavior During Session: [facility] for tasks performed    Balance  Balance Balance Assessed: Yes Static Standing Balance Static Standing - Balance Support: Bilateral upper extremity supported Static Standing - Level of Assistance: 5: Stand by assistance  End of Session PT - End of Session Equipment Utilized During Treatment: Gait belt;Other (comment) (rt knee brace) Activity Tolerance: Patient tolerated treatment well Patient left: in bed;with call bell/phone within reach Nurse Communication: Mobility status   GP     [facility] 11/15/2012, 3:20 PM  [facility] PT [phone]

## 2012-11-15 NOTE — Consult Note (Signed)
Patient Name: [name] Date of Encounter: 11/15/2012 Admit date: 11/12/12  Primary Physician:   Yvetta Coder, Paulo Fruit, MD Primary Cardiologist  Reason for Consultation   Acute CHF exacerbation   HPI: Mr. Bills is a 79 year old male with no history of CAD but with a history of uncontrolled HTN, CKD, and DM who is being evaluated for CHF.  The patient reports bilateral leg edema with gradual worsening of symptoms for the past several weeks. On 11/12/12 he presented to the ED with generalized edema and lower leg pain for 4 days, and he finally came to the hospital because he could no longer lift his legs into the bed. Denies dyspnea at rest and dyspnea on exertion. 3 pillow orthopnea. No PND, chest pain, palpitations, lightheadedness/dizziness.   Past Medical History  Diagnosis Date  . Pneumonia   . Lymphoma     s/p partial gastrectomy  . Hypertension   . Hypercholesteremia   . Diabetes mellitus   . Gout   . Irregular heart beat   . Kidney stone    Past Surgical History  Procedure Laterality Date  . Partial gastrectomy  1994    for lymphoma  . Hernia repair    . Joint replacement    . Back surgery    . Esophagogastroduodenoscopy N/A 10/28/2012    Procedure: ESOPHAGOGASTRODUODENOSCOPY (EGD);  Surgeon: Shirley Friar, MD;  Location: [facility];  Service: Endoscopy;  Laterality: N/A;   History   Social History  . Marital Status: Widowed    Spouse Name: N/A    Number of Children: N/A  . Years of Education: N/A   Occupational History  . Retired [employer]    Social History Main Topics  . Smoking status: Never Smoker   . Smokeless tobacco: Never Used  . Alcohol Use: No  . Drug Use: No  . Sexually Active: No   Other Topics Concern  . Not on file   Social History Narrative   Widowed, lives alone.  Has family in the area.  Most of his medical care comes through the [place] in [place].   Review of Systems  Constitution: Positive for weakness.  Cardiovascular:  Positive for dyspnea on exertion and leg swelling.  Respiratory: Positive for shortness of breath.   All other systems reviewed and are negative.   Scheduled Meds: . aspirin EC  81 mg Oral Daily  . carvedilol  12.5 mg Oral BID WC  . cycloSPORINE  1 drop Both Eyes BID  . ergocalciferol  50,000 Units Oral Q14 Days  . gabapentin  100 mg Oral BID  . heparin  5,000 Units Subcutaneous Q8H  . insulin aspart  0-15 Units Subcutaneous TID WC  . isosorbide-hydrALAZINE  1 tablet Oral BID  . niacin  1,000 mg Oral QHS  . pantoprazole  40 mg Oral Daily  . polyethylene glycol  17 g Oral Daily  . sertraline  200 mg Oral QHS  . simvastatin  10 mg Oral QHS  . sodium chloride  3 mL Intravenous Q12H   PRN Meds:.sodium chloride, acetaminophen, acetaminophen, Artificial Tear Ointment, bisacodyl, HYDROcodone-acetaminophen, hydroxypropyl methylcellulose, ondansetron (ZOFRAN) IV, sodium chloride  OBJECTIVE Filed Vitals:   11/14/12 1453 11/14/12 2126 11/15/12 0546 11/15/12 0818  BP: 119/73 129/75 129/66 160/94  Pulse: 77 62 71 80  Temp: 98 F (36.7 C) 97.5 F (36.4 C) 97.5 F (36.4 C)   TempSrc: Oral Oral Oral   Resp: 18 18 18    Height:  Weight:   262 lb 12.6 oz (119.2 kg)   SpO2: 100% 100% 98%     Intake/Output Summary (Last 24 hours) at 11/15/12 1037 Last data filed at 11/15/12 0600  Gross per 24 hour  Intake    118 ml  Output   1200 ml  Net  -1082 ml   Filed Weights   11/13/12 0422 11/14/12 0529 11/15/12 0546  Weight: 262 lb 12.6 oz (119.2 kg) 262 lb 12.6 oz (119.2 kg) 262 lb 12.6 oz (119.2 kg)    PHYSICAL EXAM General: Awake, alert, male  in no acute distress.  Head: Normocephalic, atraumatic.  Neck: Supple without bruits, JVD 10 cm. No cervical lymphadenopathy. Lungs:  Resp regular and unlabored, slightly decreased breath sounds bases with some rales.. Heart: RRR, S1, S2, no S3, S4, or murmur; no rub. Abdomen: Soft, chronic tenderness due to hernia, distended, BS + x 4.    Extremities: No clubbing, cyanosis, 2+ edema in bilateral lower extremities with tenderness to palpation.  Neuro: Alert and oriented X 3. Moves all extremities spontaneously. Left ankle decreased dorsiflexion strength/foot drop.  Psych: Normal affect.  LABS: CBC:  Recent Labs  11-24-2012 1758  WBC 5.9  HGB 11.9*  HCT 35.6*  MCV 90.6  PLT 163   Basic Metabolic Panel:  Recent Labs  45/40/98 0510 11/15/12 0625  NA 139 139  K 3.6 4.2  CL 98 99  CO2 30 32  GLUCOSE 117* 117*  BUN 21 26*  CREATININE 1.90* 2.32*  CALCIUM 9.1 8.5   Liver Function Tests:  Lab Results  Component Value Date   ALT 19 November 24, 2012   AST 32 2012/11/24   ALKPHOS 143* Nov 24, 2012   BILITOT 0.7 11-24-12    Cardiac Enzymes:  Recent Labs  11-24-12 1758 11-24-12 2201 11/13/12 0445  TROPONINI <0.30 <0.30 <0.30   Lab Results  Component Value Date   TSH 3.183 November 24, 2012     Recent Labs  2012-11-24 1045  TROPIPOC 0.02   BNP: Pro B Natriuretic peptide (BNP)  Date/Time Value Range Status  11/14/2012  5:10 AM 4368.0* 0 - 450 pg/mL Final  11-24-2012 10:33 AM 4088.0* 0 - 450 pg/mL Final   Hemoglobin A1C:  Recent Labs  24-Nov-2012 1758  HGBA1C 6.0*   Fasting Lipid Panel:  Recent Labs  11/24/12 1758  CHOL 87  HDL 38*  LDLCALC 37  TRIG 60  CHOLHDL 2.3   Thyroid Function Tests:  Recent Labs  2012-11-24 1758  TSH 3.183   Urinalysis    Component Value Date/Time   COLORURINE YELLOW 24-Nov-2012 1342   APPEARANCEUR CLEAR 11-24-12 1342   LABSPEC 1.007 2012/11/24 1342   PHURINE 6.0 November 24, 2012 1342   GLUCOSEU NEGATIVE 11/24/2012 1342   HGBUR TRACE* 11/24/12 1342   BILIRUBINUR NEGATIVE 24-Nov-2012 1342   KETONESUR NEGATIVE 2012/11/24 1342   PROTEINUR 30* 11-24-2012 1342   UROBILINOGEN 0.2 Nov 24, 2012 1342   NITRITE NEGATIVE 11/24/2012 1342   LEUKOCYTESUR NEGATIVE 11-24-12 1342   ECG: 11/24/12 10:16:04 [facility]/ED ROUTINE RECORD Sinus rhythm with 1st degree A-V block with  Premature atrial complexes Right bundle branch block Abnormal ECG No significant change since last tracing Vent. rate 64 BPM PR interval 440 ms QRS duration 154 ms QT/QTc 458/472 ms P-R-T axes -2 38 -85  Radiology/Studies: Dg Knee 1-2 Views Right 24-Nov-2012  *RADIOLOGY REPORT*  Clinical Data: Right knee pain and swelling.  Arthritis.  RIGHT KNEE - 1-2 VIEW  Comparison: 09/14/2012  Findings: No evidence of acute fracture or dislocation.  A moderate to large knee joint effusion is again seen, without significant change.  Osteoarthritis is again seen with severe medial compartment joint space narrowing causing tibia varus.  This is unchanged in appearance since previous study.  Peripheral vascular calcification also noted.  IMPRESSION:  1.  Stable moderate to large knee joint effusion. 2.  Stable osteoarthritis, with severe medial compartment joint space narrowing and secondary tibial varus.   Original Report Authenticated By: Myles Rosenthal, M.D.    US Abdomen Limited 11/12/2012  *RADIOLOGY REPORT*  Clinical Data: Abdominal distention.  Evaluate for ascites.  LIMITED ABDOMEN ULTRASOUND FOR ASCITES  Technique:  Limited ultrasound survey for ascites was performed in all four abdominal quadrants.  Comparison:  None.  Findings: Survey of all four abdominal quadrants shows no evidence of ascites.  IMPRESSION: No evidence of ascites.   Original Report Authenticated By: Myles Rosenthal, M.D.    Dg Chest Port 1 View 11/12/2012  *RADIOLOGY REPORT*  Clinical Data: Shortness of breath.  PORTABLE CHEST - 1 VIEW  Comparison: PA and lateral chest 11/22/2011.  Findings: The patient is rotated on the study.  There is cardiomegaly without edema.  Lungs appear clear.  No pneumothorax or pleural fluid.  Right shoulder replacement noted.  Severe degenerative disease left shoulder is identified.  IMPRESSION: Cardiomegaly without acute disease.   Original Report Authenticated By: Holley Dexter, M.D.     Current Medications:  .  aspirin EC  81 mg Oral Daily  . carvedilol  12.5 mg Oral BID WC  . cycloSPORINE  1 drop Both Eyes BID  . ergocalciferol  50,000 Units Oral Q14 Days  . gabapentin  100 mg Oral BID  . heparin  5,000 Units Subcutaneous Q8H  . insulin aspart  0-15 Units Subcutaneous TID WC  . isosorbide-hydrALAZINE  1 tablet Oral BID  . niacin  1,000 mg Oral QHS  . pantoprazole  40 mg Oral Daily  . polyethylene glycol  17 g Oral Daily  . sertraline  200 mg Oral QHS  . simvastatin  10 mg Oral QHS  . sodium chloride  3 mL Intravenous Q12H      ASSESSMENT AND PLAN:  CHF (congestive heart failure) - will need ongoing diuretic, consider Lasi 40 mg po bid. Pt still with evidence of extra volume, right heart cath will help define C.O., pulm pressures and hopefully provide information on the etiology of his heart failure which is out of proportion to his degree of left ventricular dysfunction. With proteinuria, will ck 24 hr urine as well - ordered.  Active Problems:   Diabetes mellitus   Gout   CKD (chronic kidney disease) stage 3, GFR 30-59 ml/min   Osteoarthritis     Signed, Theodore Demark , PA-C 10:37 AM 11/15/2012  Patient examined chart reviewed.  Right sided findings dysproportionate to echo results.  Diastolic parameters not valid with such a long PR interval on ECG.  Protein is positive in UA Check 24 hour urine protein Albumin mildly decreased. Previous alcoholic but LFTs not but and clinical history not consistant with portal vein thrombosis.  CT last year of abdomen ok.  Discussed need for right heart cath with patient to help guide Rx given CRF and elevation in Cr with diuresis.  Exam remarkable for plus 2 LE edema and JVP elevation.  Charlton Haws 11/15/2012

## 2012-11-16 ENCOUNTER — Encounter (HOSPITAL_COMMUNITY)
Admission: EM | Disposition: A | Discharge status: Home Health | Payer: Self-pay | Source: Home / Self Care | Attending: Internal Medicine

## 2012-11-16 DIAGNOSIS — I509 Heart failure, unspecified: Secondary | ICD-10-CM

## 2012-11-16 DIAGNOSIS — D649 Anemia, unspecified: Secondary | ICD-10-CM

## 2012-11-16 HISTORY — PX: RIGHT HEART CATHETERIZATION: SHX5447

## 2012-11-16 LAB — POCT I-STAT 3, VENOUS BLOOD GAS (G3P V)
Bicarbonate: 29.9 mEq/L — ABNORMAL HIGH (ref 20.0–24.0)
O2 Saturation: 53 %
TCO2: 31 mmol/L (ref 0–100)

## 2012-11-16 LAB — PROTEIN, URINE, 24 HOUR
Collection Interval-UPROT: 24 hours
Protein, 24H Urine: 788 mg/d — ABNORMAL HIGH (ref 50–100)
Urine Total Volume-UPROT: 1875 mL

## 2012-11-16 LAB — GLUCOSE, CAPILLARY
Glucose-Capillary: 108 mg/dL — ABNORMAL HIGH (ref 70–99)
Glucose-Capillary: 107 mg/dL — ABNORMAL HIGH (ref 70–99)
Glucose-Capillary: 110 mg/dL — ABNORMAL HIGH (ref 70–99)

## 2012-11-16 LAB — CBC
MCH: 30.3 pg (ref 26.0–34.0)
MCH: 30.3 pg (ref 26.0–34.0)
MCHC: 33.2 g/dL (ref 30.0–36.0)
MCV: 91.7 fL (ref 78.0–100.0)
Platelets: 180 10*3/uL (ref 150–400)
Platelets: 181 10*3/uL (ref 150–400)
RBC: 3.6 MIL/uL — ABNORMAL LOW (ref 4.22–5.81)

## 2012-11-16 LAB — BASIC METABOLIC PANEL
BUN: 29 mg/dL — ABNORMAL HIGH (ref 6–23)
Calcium: 8.6 mg/dL (ref 8.4–10.5)
GFR calc non Af Amer: 26 mL/min — ABNORMAL LOW (ref >90)
Glucose, Bld: 130 mg/dL — ABNORMAL HIGH (ref 70–99)

## 2012-11-16 LAB — POCT I-STAT 3, ART BLOOD GAS (G3+)
Bicarbonate: 30.1 mEq/L — ABNORMAL HIGH (ref 20.0–24.0)
O2 Saturation: 94 %
TCO2: 31 mmol/L (ref 0–100)
pCO2 arterial: 40.6 mmHg (ref 35.0–45.0)
pO2, Arterial: 66 mmHg — ABNORMAL LOW (ref 80.0–100.0)

## 2012-11-16 SURGERY — RIGHT HEART CATH
Anesthesia: LOCAL

## 2012-11-16 MED ORDER — SODIUM CHLORIDE 0.9 % IJ SOLN
3.0000 mL | Freq: Two times a day (BID) | INTRAMUSCULAR | Status: DC
  Administered 2012-11-16 – 2012-11-24 (×8): 3 mL via INTRAVENOUS

## 2012-11-16 MED ORDER — FENTANYL CITRATE 0.05 MG/ML IJ SOLN
INTRAMUSCULAR | Status: AC
  Filled 2012-11-16: qty 2

## 2012-11-16 MED ORDER — SODIUM CHLORIDE 0.9 % IJ SOLN
3.0000 mL | Freq: Two times a day (BID) | INTRAMUSCULAR | Status: DC
  Administered 2012-11-17 – 2012-11-19 (×4): 3 mL via INTRAVENOUS
  Administered 2012-11-20: 10:00:00 via INTRAVENOUS
  Administered 2012-11-21 – 2012-11-23 (×5): 3 mL via INTRAVENOUS

## 2012-11-16 MED ORDER — SODIUM CHLORIDE 0.9 % IJ SOLN: 3.0000 mL | INTRAMUSCULAR | Status: DC | PRN

## 2012-11-16 MED ORDER — DIAZEPAM 5 MG PO TABS
5.0000 mg | ORAL_TABLET | ORAL | Status: AC
  Administered 2012-11-16: 5 mg via ORAL
  Filled 2012-11-16: qty 1

## 2012-11-16 MED ORDER — ONDANSETRON HCL 4 MG/2ML IJ SOLN
4.0000 mg | Freq: Four times a day (QID) | INTRAMUSCULAR | Status: DC | PRN
  Filled 2012-11-16 (×2): qty 2

## 2012-11-16 MED ORDER — MIDAZOLAM HCL 2 MG/2ML IJ SOLN
INTRAMUSCULAR | Status: AC
  Filled 2012-11-16: qty 2

## 2012-11-16 MED ORDER — SODIUM CHLORIDE 0.9 % IV SOLN: 250.0000 mL | INTRAVENOUS | Status: DC | PRN

## 2012-11-16 MED ORDER — MILRINONE IN DEXTROSE 20 MG/100ML IV SOLN
0.2500 ug/kg/min | INTRAVENOUS | Status: DC
  Administered 2012-11-16 – 2012-11-22 (×14): 0.25 ug/kg/min via INTRAVENOUS
  Filled 2012-11-16 (×15): qty 100

## 2012-11-16 MED ORDER — ACETAMINOPHEN 325 MG PO TABS: 650.0000 mg | ORAL_TABLET | ORAL | Status: DC | PRN

## 2012-11-16 MED ORDER — FUROSEMIDE 10 MG/ML IJ SOLN
80.0000 mg | Freq: Two times a day (BID) | INTRAMUSCULAR | Status: DC
  Administered 2012-11-16 – 2012-11-17 (×2): 80 mg via INTRAVENOUS
  Filled 2012-11-16 (×3): qty 8

## 2012-11-16 MED ORDER — LIDOCAINE HCL (PF) 1 % IJ SOLN
INTRAMUSCULAR | Status: AC
  Filled 2012-11-16: qty 30

## 2012-11-16 MED ORDER — HEPARIN (PORCINE) IN NACL 2-0.9 UNIT/ML-% IJ SOLN
INTRAMUSCULAR | Status: AC
  Filled 2012-11-16: qty 1000

## 2012-11-16 NOTE — Progress Notes (Signed)
PATIENT DETAILS Name: [name] Age: 79 y.o. Sex: male Date of Birth: 04/07/1932 Admit Date: 11/12/2012 Admitting Physician Shanker Levora Dredge, MD [id number] Minda Meo, MD  Subjective: Patient reports he can walk now, where as he couldn't walk when he came in.  Assessment/Plan: Active Problems:  CHF (congestive heart failure) exacerbation  -Anasarca likely due to congestive heart failure -No stigmata of cirrhosis and urinalysis shows only 30 of protein.  (unlikely nephrotic syndrome) - 2-D echocardiogram -shows slightly decreased Systolic function with EF around 45 percent - Continue beta blocker - given stage III CKD and the need for diureses-will not start ACE inhibitor-on BiDIL - Diuresed 7.3 liters this admission.  Unfortunately  Kidneys unable to tolerate diuretic therapy-and diuretic therapy has been temporarily stopped since 3/17-pending further w/u by cardiology - Cardiology plans right sided cardiac cath today.   Acute on CKD (chronic kidney disease) stage 3, GFR 30-59 ml/min  - baseline creatinine 1.6 - 1.7 - creatinine increased to 2.32 on 3/17 due to diuretic therapy.  Lasix held.Creatinine on 3/18-slight decrease to 2.28  Normocytic Anemia -Likely secondary to chronic kidney disease.  No obvious signs of bleeding.  Uncontrolled HTN -better control -continue Coreg and BiDil.  Holding lasix for now.  Diabetes mellitus  - continue to hold oral hyperglycemic agents.  His pattern is low CBG in am. - c/w  SSI for now  - CBGs within reasonable range. -  A1c 6.0  Gout  - Stable  - Monitor for signs of flare-especially while on diuretics   Osteoarthritis  - Patient claims that he has severe osteoarthritis of his right knee-he sees a orthopedic doctor at [facility] system-he has been told that he is not to operative candidate for right knee replacement-as he has severe arthritis and a foot drop on the same side  - His right knee on exam-does not appear to have any  tenderness or erythema to suggest an infective process -X Ray right knee-show chronic changes and unchanged/stable moderate effusion  Disposition: Remain inpatient.  HF Home Health RN and PT ordered-when able to discharge  DVT Prophylaxis: Prophylactic Heparin  Code Status: Full code   Procedures:  2D echo - Left ventricle: The cavity size was normal. There was focal basal hypertrophy. Systolic function was mildly reduced. The estimated ejection fraction was in the range of 45% to 50%. - Mitral valve: Mild regurgitation. - Left atrium: The atrium was mildly dilated. - Right ventricle: The cavity size was mildly dilated. - Right atrium: The atrium was mildly dilated.   Cardiac Cath planned 3/18    CONSULTS:  cardiology  PHYSICAL EXAM: Vital signs in last 24 hours: Filed Vitals:   11/15/12 2107 11/16/12 0435 11/16/12 0516 11/16/12 0801  BP: 118/55 137/69 149/64 151/85  Pulse: 63 71 67 76  Temp: 98.4 F (36.9 C) 98.2 F (36.8 C) 98.3 F (36.8 C)   TempSrc:  Oral Oral   Resp: 18 20 18    Height:      Weight:  118.2 kg (260 lb 9.3 oz) 117.6 kg (259 lb 4.2 oz)   SpO2: 96% 93% 95%     Weight change: -1 kg (-2 lb 3.3 oz) Body mass index is 33.27 kg/(m^2).   Gen Exam: Awake and alert, nad, lying in bed. Neck: Supple, No JVD.   Chest: B/L Clear.  No w/c/r, no cough CVS: S1 S2 Regular, no murmurs.  Abdomen: soft, BS +, non tender, large ventral hernia with central scan in place Extremities: improved edema.  1-2+ bilaterally.   lower extremities warm to touch. Neurologic: Non Focal.   Skin: No Rash.   Wounds: N/A.    Intake/Output from previous day:  Intake/Output Summary (Last 24 hours) at 11/16/12 0833 Last data filed at 11/15/12 1823  Gross per 24 hour  Intake    720 ml  Output    900 ml  Net   -180 ml     LAB RESULTS: CBC  Recent Labs Lab 11/12/12 1022 11/12/12 1758 11/16/12 0620  WBC 5.6 5.9 4.6  HGB 10.7* 11.9* 10.1*  HCT 32.4* 35.6* 30.4*   PLT 155 163 180  MCV 91.3 90.6 91.3  MCH 30.1 30.3 30.3  MCHC 33.0 33.4 33.2  RDW 13.6 13.6 13.8    Chemistries   Recent Labs Lab 11/12/12 1022 11/12/12 1758 11/13/12 0445 11/14/12 0510 11/15/12 0625 11/16/12 0620  NA 140  --  141 139 139 138  K 3.2*  --  3.3* 3.6 4.2 4.5  CL 106  --  101 98 99 98  CO2 26  --  29 30 32 31  GLUCOSE 98  --  83 117* 117* 130*  BUN 16  --  18 21 26* 29*  CREATININE 1.52* 1.40* 1.58* 1.90* 2.32* 2.28*  CALCIUM 8.8  --  9.4 9.1 8.5 8.6    CBG:  Recent Labs Lab 11/15/12 0749 11/15/12 1159 11/15/12 1716 11/15/12 2106 11/16/12 0754  GLUCAP 106* 164* 171* 167* 108*     Cardiac Enzymes  Recent Labs Lab 11/12/12 1758 11/12/12 2201 11/13/12 0445  TROPONINI <0.30 <0.30 <0.30     MICROBIOLOGY: Recent Results (from the past 240 hour(s))  URINE CULTURE     Status: None   Collection Time    11/12/12  1:42 PM      Result Value Range Status   Specimen Description URINE, CLEAN CATCH   Final   Special Requests NONE   Final   Culture  Setup Time 11/12/2012 14:05   Final   Colony Count NO GROWTH   Final   Culture NO GROWTH   Final   Report Status 11/13/2012 FINAL   Final    RADIOLOGY STUDIES/RESULTS: Dg Knee 1-2 Views Right  11/12/2012  *RADIOLOGY REPORT*  Clinical Data: Right knee pain and swelling.  Arthritis.  RIGHT KNEE - 1-2 VIEW  Comparison: 09/14/2012  Findings: No evidence of acute fracture or dislocation.  A moderate to large knee joint effusion is again seen, without significant change.  Osteoarthritis is again seen with severe medial compartment joint space narrowing causing tibia varus.  This is unchanged in appearance since previous study.  Peripheral vascular calcification also noted.  IMPRESSION:  1.  Stable moderate to large knee joint effusion. 2.  Stable osteoarthritis, with severe medial compartment joint space narrowing and secondary tibial varus.   Original Report Authenticated By: Myles Rosenthal, M.D.    US Abdomen  Limited  11/12/2012  *RADIOLOGY REPORT*  Clinical Data: Abdominal distention.  Evaluate for ascites.  LIMITED ABDOMEN ULTRASOUND FOR ASCITES  Technique:  Limited ultrasound survey for ascites was performed in all four abdominal quadrants.  Comparison:  None.  Findings: Survey of all four abdominal quadrants shows no evidence of ascites.  IMPRESSION: No evidence of ascites.   Original Report Authenticated By: Myles Rosenthal, M.D.    Dg Chest Port 1 View  11/12/2012  *RADIOLOGY REPORT*  Clinical Data: Shortness of breath.  PORTABLE CHEST - 1 VIEW  Comparison: PA and lateral chest 11/22/2011.  Findings: The patient  is rotated on the study.  There is cardiomegaly without edema.  Lungs appear clear.  No pneumothorax or pleural fluid.  Right shoulder replacement noted.  Severe degenerative disease left shoulder is identified.  IMPRESSION: Cardiomegaly without acute disease.   Original Report Authenticated By: Holley Dexter, M.D.     MEDICATIONS: Scheduled Meds: . aspirin EC  81 mg Oral Daily  . carvedilol  12.5 mg Oral BID WC  . cycloSPORINE  1 drop Both Eyes BID  . ergocalciferol  50,000 Units Oral Q14 Days  . gabapentin  100 mg Oral BID  . heparin  5,000 Units Subcutaneous Q8H  . insulin aspart  0-15 Units Subcutaneous TID WC  . isosorbide-hydrALAZINE  1 tablet Oral BID  . niacin  1,000 mg Oral QHS  . pantoprazole  40 mg Oral Daily  . polyethylene glycol  17 g Oral Daily  . sertraline  200 mg Oral QHS  . simvastatin  10 mg Oral QHS  . sodium chloride  3 mL Intravenous Q12H   Continuous Infusions: . sodium chloride     PRN Meds:.sodium chloride, acetaminophen, acetaminophen, Artificial Tear Ointment, bisacodyl, HYDROcodone-acetaminophen, hydroxypropyl methylcellulose, ondansetron (ZOFRAN) IV, sodium chloride  Antibiotics: Anti-infectives   None       [facility] Pager:[phone]  If 7PM-7AM, please contact night-coverage [email or web address] Password  [facility] 11/16/2012, 8:33 AM   LOS: 4 days   Attending  Patient seen and examined, agree with the assessment and plan. For Right heart Cath today. Patient clinically still appears with excess volume-but unable to diurese because of worsening renal function. Await Further w/u by cards-may need to involve nephrology-if diuresis proves to be difficult  S Ghimire

## 2012-11-16 NOTE — Progress Notes (Signed)
Patient Name: [name]      SUBJECTIVE: 79 yo admitted with acute mixed HFrEF and HFpEF w R>L presentation  This occurs in context of Dm, HTN and modest and now worsening renal function.  Coreg and BiDil have been initiated.  Has DOE and edema, chronic arthritis and recurrent aspiration procedures for his knees ? Steroids  Recurring edema Rx w lasix with out much improvement  C/o chest arthritis, lasting minutes--not clear trigger, but he rubs it and over minute or two gets better  Profound 1AVB     Past Medical History  Diagnosis Date  . Pneumonia   . Lymphoma     s/p partial gastrectomy  . Hypertension   . Hypercholesteremia   . Diabetes mellitus   . Gout   . Irregular heart beat   . Kidney stone     PHYSICAL EXAM Filed Vitals:   11/15/12 2107 11/16/12 0435 11/16/12 0516 11/16/12 0801  BP: 118/55 137/69 149/64 151/85  Pulse: 63 71 67 76  Temp: 98.4 F (36.9 C) 98.2 F (36.8 C) 98.3 F (36.8 C)   TempSrc:  Oral Oral   Resp: 18 20 18    Height:      Weight:  260 lb 9.3 oz (118.2 kg) 259 lb 4.2 oz (117.6 kg)   SpO2: 96% 93% 95%     General appearance: alert and no distress Neck: JVD - 10 cm above sternal notch,  Lungs: diminished breath sounds bilaterally Heart: regular rate and rhythm, diminshed S1, S2 normal, n Abdomen: [protruberant Extremities: edema brawny Skin: Skin color, texture, turgor normal. No rashes or lesions Neurologic: Alert and oriented X 3, normal strength and tone. Normal symmetric reflexes. Normal coordination and gait  TELEMETRY: Reviewed telemetry pt in nsr :    Intake/Output Summary (Last 24 hours) at 11/16/12 0855 Last data filed at 11/15/12 1823  Gross per 24 hour  Intake    720 ml  Output    900 ml  Net   -180 ml    LABS: Basic Metabolic Panel:  Recent Labs Lab 11/12/12 1022 11/12/12 1758 11/13/12 0445 11/14/12 0510 11/15/12 0625 11/16/12 0620  NA 140  --  141 139 139 138  K 3.2*  --  3.3* 3.6 4.2 4.5  CL  106  --  101 98 99 98  CO2 26  --  29 30 32 31  GLUCOSE 98  --  83 117* 117* 130*  BUN 16  --  18 21 26* 29*  CREATININE 1.52* 1.40* 1.58* 1.90* 2.32* 2.28*  CALCIUM 8.8  --  9.4 9.1 8.5 8.6   Cardiac Enzymes: No results found for this basename: CKTOTAL, CKMB, CKMBINDEX, TROPONINI,  in the last 72 hours CBC:  Recent Labs Lab 11/12/12 1022 11/12/12 1758 11/16/12 0620  WBC 5.6 5.9 4.6  HGB 10.7* 11.9* 10.1*  HCT 32.4* 35.6* 30.4*  MCV 91.3 90.6 91.3  PLT 155 163 180    BNP (last 3 results)  Recent Labs  08/26/12 1620 11/12/12 1033 11/14/12 0510  PROBNP 1898.0* 4088.0* 4368.0*  ECG: Sinus Rhythm   @ 67            Intervals  44/16/48  Axis 45         ASSESSMENT AND PLAN: R>L CHF Profound 1AVB w RBBB Anemia Acute renal injury For RHC today;  Will also schedule for myoview with hx of chest arthritis, with unstable Cr not sure contrast is worth it yet Would benefit from exercise test, albeit  limited as i suspect 1AVB may have impact on performance Continue coreg and bidil Follow Cr  Signed, Sherryl Manges MD  11/16/2012

## 2012-11-16 NOTE — Progress Notes (Signed)
Physical Therapy Treatment Patient Details Name: [name] MRN: [id number] DOB: [birth year] Today's Date: 11/16/2012 Time: 4782-9562 PT Time Calculation (min): 29 min  PT Assessment / Plan / Recommendation Comments on Treatment Session  Pt with CHF progressing well with mobility awaiting cardiac cath later today. pt able to perform gait and HEP today with HR stable at 74 and sats 98% on RA. Pt encouraged to continue mobility with staff assist for lines. Will follow.     Follow Up Recommendations        Does the patient have the potential to tolerate intense rehabilitation     Barriers to Discharge        Equipment Recommendations       Recommendations for Other Services    Frequency     Plan Discharge plan remains appropriate;Frequency remains appropriate    Precautions / Restrictions Precautions Precautions: Fall Precaution Comments: right knee OA and foot drop   Pertinent Vitals/Pain No pain    Mobility  Bed Mobility Supine to Sit: 6: Modified independent (Device/Increase time);With rails Transfers Sit to Stand: 5: Supervision;From bed Stand to Sit: 5: Supervision;To chair/3-in-1 Details for Transfer Assistance: increased time and assist for line management Ambulation/Gait Ambulation/Gait Assistance: 5: Supervision Ambulation Distance (Feet): 180 Feet Assistive device: Rolling walker Ambulation/Gait Assistance Details: steppage gait on right due to foot drop without AFO present Gait Pattern: Step-through pattern;Right steppage;Decreased stride length Gait velocity: decreased    Exercises General Exercises - Lower Extremity Long Arc Quad: AROM;Both;20 reps;Seated Hip ABduction/ADduction: AROM;Both;20 reps;Seated Hip Flexion/Marching: AROM;Both;20 reps;Seated   PT Diagnosis:    PT Problem List:   PT Treatment Interventions:     PT Goals Acute Rehab PT Goals PT Goal: Sit to Stand - Progress: Progressing toward goal PT Goal: Stand to Sit - Progress:  Progressing toward goal Pt will Ambulate: with modified independence;with least restrictive assistive device;>150 feet PT Goal: Ambulate - Progress: Updated due to goal met  Visit Information  Last PT Received On: 11/16/12 Assistance Needed: +1    Subjective Data  Subjective: I just doesnt' feel right when people try to help me   Cognition  Cognition Overall Cognitive Status: Appears within functional limits for tasks assessed/performed Arousal/Alertness: Awake/alert Orientation Level: Appears intact for tasks assessed Behavior During Session: [facility] for tasks performed    Balance     End of Session PT - End of Session Activity Tolerance: Patient tolerated treatment well Patient left: in chair;with call bell/phone within reach Nurse Communication: Mobility status   GP     [name] 11/16/2012, 10:57 AM Delaney Meigs, PT [phone]

## 2012-11-16 NOTE — CV Procedure (Signed)
   Cardiac Catheterization Procedure Note  Name: [name] MRN: [id number] DOB: 05-16-1932  Procedure: Right Heart Cath  Indication:    Procedural Details: The right groin was prepped, draped, and anesthetized with 1% lidocaine. Using the modified Seldinger technique a 7 French sheath was placed in the right femoral vein. A Swan-Ganz catheter was used for the right heart catheterization. Standard protocol was followed for recording of right heart pressures and sampling of oxygen saturations. Fick cardiac output was calculated. There were no immediate procedural complications. The patient was transferred to the post catheterization recovery area for further monitoring.  Procedural Findings: Hemodynamics (mmHg) RA mean 19 RV 53/16 PA 56/23, mean 38 PCWP mean 21  Oxygen saturations: PA 53% AO 94%  Cardiac Output (Fick) 5.05  Cardiac Index (Fick) 2.36 PVR 3.36 WU  Final Conclusions:  Elevated right heart filling pressure out of proportion to left heart filling pressure.  Primarily RV failure.  Will discuss milrinone versus dopamine for RV support + diuresis with Dr. Graciela Husbands, probably milrinone given elevated BP.    Marca Ancona 11/16/2012, 3:57 PM

## 2012-11-16 NOTE — Progress Notes (Signed)
Spoke to Dr. Shirlee Latch. Patient to be transferred to [id number] via bed. Report called to [facility]. Post cath lab with dsg to right groin dry and intact. No complaints of pain. Patient has had a snack and medication admin current. Will transfer on the monitor to receiving RN.

## 2012-11-17 LAB — BASIC METABOLIC PANEL
BUN: 32 mg/dL — ABNORMAL HIGH (ref 6–23)
Chloride: 95 mEq/L — ABNORMAL LOW (ref 96–112)
GFR calc Af Amer: 30 mL/min — ABNORMAL LOW (ref >90)
Potassium: 4.1 mEq/L (ref 3.5–5.1)
Sodium: 136 mEq/L (ref 135–145)

## 2012-11-17 LAB — CBC
HCT: 31.4 % — ABNORMAL LOW (ref 39.0–52.0)
Hemoglobin: 10.3 g/dL — ABNORMAL LOW (ref 13.0–17.0)
MCHC: 32.8 g/dL (ref 30.0–36.0)
RBC: 3.42 MIL/uL — ABNORMAL LOW (ref 4.22–5.81)
WBC: 5 10*3/uL (ref 4.0–10.5)

## 2012-11-17 LAB — GLUCOSE, CAPILLARY
Glucose-Capillary: 137 mg/dL — ABNORMAL HIGH (ref 70–99)
Glucose-Capillary: 182 mg/dL — ABNORMAL HIGH (ref 70–99)
Glucose-Capillary: 157 mg/dL — ABNORMAL HIGH (ref 70–99)

## 2012-11-17 MED ORDER — FUROSEMIDE 10 MG/ML IJ SOLN
120.0000 mg | Freq: Two times a day (BID) | INTRAMUSCULAR | Status: DC
  Administered 2012-11-17: 120 mg via INTRAVENOUS
  Filled 2012-11-17 (×2): qty 12

## 2012-11-17 MED ORDER — CYCLOBENZAPRINE HCL 10 MG PO TABS
10.0000 mg | ORAL_TABLET | Freq: Once | ORAL | Status: AC
  Administered 2012-11-17: 10 mg via ORAL
  Filled 2012-11-17: qty 1

## 2012-11-17 MED ORDER — CYCLOBENZAPRINE HCL 5 MG PO TABS
5.0000 mg | ORAL_TABLET | Freq: Two times a day (BID) | ORAL | Status: DC
  Administered 2012-11-17 – 2012-11-25 (×17): 5 mg via ORAL
  Filled 2012-11-17 (×20): qty 1

## 2012-11-17 NOTE — Progress Notes (Signed)
Pt requests muscle relaxer that he takes at home. MD notified and 1 time dose of flexeril given to pt. Will continue to monitor. Jobe Igo A RN 11/17/2012

## 2012-11-17 NOTE — Progress Notes (Signed)
PATIENT DETAILS Name: [name] Age: 79 y.o. Sex: male Date of Birth: [birth year] Admit Date: 11/12/2012 Admitting Physician Shanker Levora Dredge, MD [id number] Minda Meo, MD  Subjective: Respiratory status is improving.  Feels his legs are less swollen.  Assessment/Plan: Active Problems:  CHF (congestive heart failure) exacerbation, right sided heart failure  -Anasarca likely due to congestive heart failure -No stigmata of cirrhosis and urinalysis shows only 30 of protein.  (unlikely nephrotic syndrome) - 2-D echocardiogram -shows slightly decreased Systolic function with EF around 45 percent - Continue beta blocker - given stage III CKD and the need for diureses-will not start ACE inhibitor-on BiDIL - Patient had right sided heart cath which revealed elevated right sided pressures consistent with RV failure. Patient has continued signs of volume overload and needs continued diuresis.  Acute on CKD (chronic kidney disease) stage 3, GFR 30-59 ml/min  - baseline creatinine 1.6 - 1.7 - although creatinine is elevated, it appears to have stabilized.  He has been started back on IV lasix.  Will monitor renal function.  Normocytic Anemia -Likely secondary to chronic kidney disease.  No obvious signs of bleeding.  Uncontrolled HTN -better control -continue Coreg and BiDil.  Holding lasix for now.  Diabetes mellitus  - continue to hold oral hyperglycemic agents.  His pattern is low CBG in am. - c/w  SSI for now  - CBGs within reasonable range. -  A1c 6.0  Gout  - Stable  - Monitor for signs of flare-especially while on diuretics   Osteoarthritis  - Patient claims that he has severe osteoarthritis of his right knee-he sees a orthopedic doctor at [facility]-he has been told that he is not to operative candidate for right knee replacement-as he has severe arthritis and a foot drop on the same side  - His right knee on exam-does not appear to have any tenderness or erythema  to suggest an infective process -X Ray right knee-show chronic changes and unchanged/stable moderate effusion  Disposition: Remain inpatient.  HF Home Health RN and PT ordered-when able to discharge  DVT Prophylaxis: Prophylactic Heparin  Code Status: Full code   Procedures:  2D echo - Left ventricle: The cavity size was normal. There was focal basal hypertrophy. Systolic function was mildly reduced. The estimated ejection fraction was in the range of 45% to 50%. - Mitral valve: Mild regurgitation. - Left atrium: The atrium was mildly dilated. - Right ventricle: The cavity size was mildly dilated. - Right atrium: The atrium was mildly dilated.  Cardiac Cath 3/18: Elevated right heart filling pressure out of proportion to left heart filling pressure. Primarily RV failure.       CONSULTS:  cardiology  PHYSICAL EXAM: Vital signs in last 24 hours: Filed Vitals:   11/16/12 2201 11/17/12 0150 11/17/12 0536 11/17/12 0900  BP: 123/56 150/59 146/80 142/78  Pulse: 67 69 74 78  Temp:  97.5 F (36.4 C) 97.5 F (36.4 C)   TempSrc:  Oral Oral   Resp:  20    Height:      Weight:   114.352 kg (252 lb 1.6 oz)   SpO2:  96% 98%     Weight change: -1.1 kg (-2 lb 6.8 oz) Body mass index is 32.35 kg/(m^2).   Gen Exam: Awake and alert, nad, lying in bed. Neck: Supple, No JVD.   Chest: B/L Clear.  No w/c/r, no cough CVS: S1 S2 Regular, no murmurs.  Abdomen: soft, BS +, non tender, large ventral hernia with central  scan in place Extremities: improved edema.  1-2+ bilaterally.   lower extremities warm to touch. Neurologic: Non Focal.   Skin: No Rash.   Wounds: N/A.    Intake/Output from previous day:  Intake/Output Summary (Last 24 hours) at 11/17/12 1229 Last data filed at 11/17/12 0842  Gross per 24 hour  Intake 926.51 ml  Output   1500 ml  Net -573.49 ml     LAB RESULTS: CBC  Recent Labs Lab 11/12/12 1022 11/12/12 1758 11/16/12 0620 11/16/12 1814 11/17/12 0530   WBC 5.6 5.9 4.6 5.1 5.0  HGB 10.7* 11.9* 10.1* 10.9* 10.3*  HCT 32.4* 35.6* 30.4* 33.0* 31.4*  PLT 155 163 180 181 189  MCV 91.3 90.6 91.3 91.7 91.8  MCH 30.1 30.3 30.3 30.3 30.1  MCHC 33.0 33.4 33.2 33.0 32.8  RDW 13.6 13.6 13.8 13.6 13.9    Chemistries   Recent Labs Lab 11/13/12 0445 11/14/12 0510 11/15/12 0625 11/16/12 0620 11/16/12 1814 11/17/12 0530  NA 141 139 139 138  --  136  K 3.3* 3.6 4.2 4.5  --  4.1  CL 101 98 99 98  --  95*  CO2 29 30 32 31  --  31  GLUCOSE 83 117* 117* 130*  --  148*  BUN 18 21 26* 29*  --  32*  CREATININE 1.58* 1.90* 2.32* 2.28* 2.13* 2.25*  CALCIUM 9.4 9.1 8.5 8.6  --  8.7    CBG:  Recent Labs Lab 11/16/12 1151 11/16/12 1638 11/16/12 2059 11/17/12 0628 11/17/12 1144  GLUCAP 107* 110* 209* 137* 182*     Cardiac Enzymes  Recent Labs Lab 11/12/12 1758 11/12/12 2201 11/13/12 0445  TROPONINI <0.30 <0.30 <0.30     MICROBIOLOGY: Recent Results (from the past 240 hour(s))  URINE CULTURE     Status: None   Collection Time    11/12/12  1:42 PM      Result Value Range Status   Specimen Description URINE, CLEAN CATCH   Final   Special Requests NONE   Final   Culture  Setup Time 11/12/2012 14:05   Final   Colony Count NO GROWTH   Final   Culture NO GROWTH   Final   Report Status 11/13/2012 FINAL   Final    RADIOLOGY STUDIES/RESULTS: Dg Knee 1-2 Views Right  11/12/2012  *RADIOLOGY REPORT*  Clinical Data: Right knee pain and swelling.  Arthritis.  RIGHT KNEE - 1-2 VIEW  Comparison: 09/14/2012  Findings: No evidence of acute fracture or dislocation.  A moderate to large knee joint effusion is again seen, without significant change.  Osteoarthritis is again seen with severe medial compartment joint space narrowing causing tibia varus.  This is unchanged in appearance since previous study.  Peripheral vascular calcification also noted.  IMPRESSION:  1.  Stable moderate to large knee joint effusion. 2.  Stable osteoarthritis, with  severe medial compartment joint space narrowing and secondary tibial varus.   Original Report Authenticated By: Myles Rosenthal, M.D.    US Abdomen Limited  11/12/2012  *RADIOLOGY REPORT*  Clinical Data: Abdominal distention.  Evaluate for ascites.  LIMITED ABDOMEN ULTRASOUND FOR ASCITES  Technique:  Limited ultrasound survey for ascites was performed in all four abdominal quadrants.  Comparison:  None.  Findings: Survey of all four abdominal quadrants shows no evidence of ascites.  IMPRESSION: No evidence of ascites.   Original Report Authenticated By: Myles Rosenthal, M.D.    Dg Chest Port 1 View  11/12/2012  *RADIOLOGY REPORT*  Clinical  Data: Shortness of breath.  PORTABLE CHEST - 1 VIEW  Comparison: PA and lateral chest 11/22/2011.  Findings: The patient is rotated on the study.  There is cardiomegaly without edema.  Lungs appear clear.  No pneumothorax or pleural fluid.  Right shoulder replacement noted.  Severe degenerative disease left shoulder is identified.  IMPRESSION: Cardiomegaly without acute disease.   Original Report Authenticated By: Holley Dexter, M.D.     MEDICATIONS: Scheduled Meds: . aspirin EC  81 mg Oral Daily  . carvedilol  12.5 mg Oral BID WC  . cyclobenzaprine  5 mg Oral BID  . cycloSPORINE  1 drop Both Eyes BID  . ergocalciferol  50,000 Units Oral Q14 Days  . furosemide  120 mg Intravenous BID  . gabapentin  100 mg Oral BID  . heparin  5,000 Units Subcutaneous Q8H  . insulin aspart  0-15 Units Subcutaneous TID WC  . isosorbide-hydrALAZINE  1 tablet Oral BID  . niacin  1,000 mg Oral QHS  . pantoprazole  40 mg Oral Daily  . polyethylene glycol  17 g Oral Daily  . sertraline  200 mg Oral QHS  . simvastatin  10 mg Oral QHS  . sodium chloride  3 mL Intravenous Q12H  . sodium chloride  3 mL Intravenous Q12H  . sodium chloride  3 mL Intravenous Q12H   Continuous Infusions: . sodium chloride    . milrinone 0.25 mcg/kg/min (11/17/12 0450)   PRN Meds:.sodium chloride,  sodium chloride, sodium chloride, acetaminophen, acetaminophen, acetaminophen, Artificial Tear Ointment, bisacodyl, HYDROcodone-acetaminophen, hydroxypropyl methylcellulose, ondansetron (ZOFRAN) IV, ondansetron (ZOFRAN) IV, sodium chloride, sodium chloride, sodium chloride  Antibiotics: Anti-infectives   None       Brianah Hopson, PA-C [facility] Pager:[phone]  If 7PM-7AM, please contact night-coverage [email or web address] Password [facility] 11/17/2012, 12:29 PM   LOS: 5 days

## 2012-11-17 NOTE — Care Management Note (Unsigned)
    Page 1 of 1   11/17/2012     3:41:33 PM   CARE MANAGEMENT NOTE 11/17/2012  Patient:  [name], [name]   Account Number:  [id number]  Date Initiated:  11/17/2012  Documentation initiated by:  Letha Cape  Subjective/Objective Assessment:   dx chf  admit     Action/Plan:   Anticipated DC Date:  11/18/2012   Anticipated DC Plan:  HOME W HOME HEALTH SERVICES      DC Planning Services  CM consult      Choice offered to / List presented to:             Status of service:  In process, will continue to follow Medicare Important Message given?   (If response is "NO", the following Medicare IM given date fields will be blank) Date Medicare IM given:   Date Additional Medicare IM given:    Discharge Disposition:    Per UR Regulation:  Reviewed for med. necessity/level of care/duration of stay  If discussed at Long Length of Stay Meetings, dates discussed:    Comments:  11/17/12 15:36 Letha Cape RN, BSN [phone] patient is s/p cardiac cath.  NCM will continue to follow for dc needs.  Patient may need HHPT and RN for CHF.

## 2012-11-17 NOTE — Progress Notes (Signed)
Patient Name: [name]   79 yo admitted with acute mixed HFrEF and HFpEF w R>L presentation This occurs in context of Dm, HTN and modest and now worsening renal function. Coreg and BiDil have been initiated.  Has DOE and edema, chronic arthritis and recurrent aspiration procedures for his knees ? Steroids  Recurring edema Rx w lasix with out much improvement  C/o chest arthritis, lasting minutes--not clear trigger, but he rubs it and over minute or two gets better  Profound 1AVB  RHC yesterday RA mean 19  RV 53/16  PA 56/23, mean 38  PCWP mean 21  Oxygen saturations:  PA 53%  AO 94%  Cardiac Output (Fick) 5.05  Cardiac Index (Fick) 2.36  PVR 3.36 WU  Final Conclusions: Elevated right heart filling pressure out of proportion to left heart filling pressure. Primarily RV failure with cardiorenal problems  \ Cr stable overnight w modest diuresis  Plan was milrinone and diuretics  Feeling better today    SUBJECTIVE:   Past Medical History  Diagnosis Date  . Pneumonia   . Lymphoma     s/p partial gastrectomy  . Hypertension   . Hypercholesteremia   . Diabetes mellitus   . Gout   . Irregular heart beat   . Kidney stone     PHYSICAL EXAM Filed Vitals:   11/16/12 2201 11/17/12 0150 11/17/12 0536 11/17/12 0900  BP: 123/56 150/59 146/80 142/78  Pulse: 67 69 74 78  Temp:  97.5 F (36.4 C) 97.5 F (36.4 C)   TempSrc:  Oral Oral   Resp:  20    Height:      Weight:   252 lb 1.6 oz (114.352 kg)   SpO2:  96% 98%    Well developed and nourished in no acute distress HENT normal Neck supple with JVP->10Clear Regular rate and rhythm, 2/6 systolic murmur  Abd-soft with active BS No Clubbing cyanosis edema Skin-warm and dry A & Oriented  Grossly normal sensory and motor function:    Intake/Output Summary (Last 24 hours) at 11/17/12 1109 Last data filed at 11/17/12 0842  Gross per 24 hour  Intake 926.51 ml  Output   1500 ml  Net -573.49 ml     LABS: Basic Metabolic Panel:  Recent Labs Lab 11/12/12 1022 11/12/12 1758 11/13/12 0445 11/14/12 0510 11/15/12 0625 11/16/12 0620 11/16/12 1814 11/17/12 0530  NA 140  --  141 139 139 138  --  136  K 3.2*  --  3.3* 3.6 4.2 4.5  --  4.1  CL 106  --  101 98 99 98  --  95*  CO2 26  --  29 30 32 31  --  31  GLUCOSE 98  --  83 117* 117* 130*  --  148*  BUN 16  --  18 21 26* 29*  --  32*  CREATININE 1.52* 1.40* 1.58* 1.90* 2.32* 2.28* 2.13* 2.25*  CALCIUM 8.8  --  9.4 9.1 8.5 8.6  --  8.7   Cardiac Enzymes: No results found for this basename: CKTOTAL, CKMB, CKMBINDEX, TROPONINI,  in the last 72 hours CBC:  Recent Labs Lab 11/12/12 1022 11/12/12 1758 11/16/12 0620 11/16/12 1814 11/17/12 0530  WBC 5.6 5.9 4.6 5.1 5.0  HGB 10.7* 11.9* 10.1* 10.9* 10.3*  HCT 32.4* 35.6* 30.4* 33.0* 31.4*  MCV 91.3 90.6 91.3 91.7 91.8  PLT 155 163 180 181 189   PROTIME:  Recent Labs  11/16/12 0920  LABPROT 13.9  INR 1.08  Liver Function Tests: No results found for this basename: AST, ALT, ALKPHOS, BILITOT, PROT, ALBUMIN,  in the last 72 hours No results found for this basename: LIPASE, AMYLASE,  in the last 72 hours BNP: BNP (last 3 results)  Recent Labs  08/26/12 1620 11/12/12 1033 11/14/12 0510  PROBNP 1898.0* 4088.0* 4368.0*      ASSESSMENT AND PLAN: Active Problems:   Diabetes mellitus   Gout   CKD (chronic kidney disease) stage 3, GFR 30-59 ml/min   Osteoarthritis   CHF (congestive heart failure)  conitnue diuretics  Will augment diuresis as renal function stable Will add nocturnal O2   Signed, Sherryl Manges MD  11/17/2012

## 2012-11-17 NOTE — Progress Notes (Signed)
Patient evaluated for community based chronic disease management services with [facility] Care Management Program as a benefit of patient's [employer].  Admitted on 3.14.14 with SOB and fluid volume overload.  Patient has accepted services.  Written consents obtained.  Patient has a scale at home but does not use it for weight monitoring.  Receives medication by mail from the [facility]. Patient could benefit from health management education as he frequents the ED for numerous concerns that could possibly be addressed by his PCP.  Patient will receive a post discharge transition of care call and will be evaluated for monthly home visits for assessments and disease process education. Spoke with patient at bedside to explain [facility] Care Management services. Left contact information and [facility] literature at bedside. Made inpatient Case Manager aware that [facility] Care Management following. Of note, [facility] Care Management services does not replace or interfere with any services that are arranged by inpatient case management or social work.  For additional questions or referrals please contact Anibal Henderson BSN RN [facility] Liaison at [phone].

## 2012-11-18 LAB — BASIC METABOLIC PANEL
BUN: 35 mg/dL — ABNORMAL HIGH (ref 6–23)
BUN: 35 mg/dL — ABNORMAL HIGH (ref 6–23)
CO2: 30 mEq/L (ref 19–32)
Calcium: 8.6 mg/dL (ref 8.4–10.5)
Calcium: 8.8 mg/dL (ref 8.4–10.5)
Chloride: 95 mEq/L — ABNORMAL LOW (ref 96–112)
Chloride: 93 mEq/L — ABNORMAL LOW (ref 96–112)
Creatinine, Ser: 2.39 mg/dL — ABNORMAL HIGH (ref 0.50–1.35)
Creatinine, Ser: 2.38 mg/dL — ABNORMAL HIGH (ref 0.50–1.35)
GFR calc Af Amer: 28 mL/min — ABNORMAL LOW (ref >90)
GFR calc Af Amer: 28 mL/min — ABNORMAL LOW (ref >90)

## 2012-11-18 LAB — GLUCOSE, CAPILLARY
Glucose-Capillary: 119 mg/dL — ABNORMAL HIGH (ref 70–99)
Glucose-Capillary: 182 mg/dL — ABNORMAL HIGH (ref 70–99)

## 2012-11-18 MED ORDER — POTASSIUM CHLORIDE CRYS ER 20 MEQ PO TBCR
20.0000 meq | EXTENDED_RELEASE_TABLET | Freq: Once | ORAL | Status: AC
  Administered 2012-11-18: 20 meq via ORAL
  Filled 2012-11-18: qty 1

## 2012-11-18 MED ORDER — ISOSORB DINITRATE-HYDRALAZINE 20-37.5 MG PO TABS
1.0000 | ORAL_TABLET | Freq: Three times a day (TID) | ORAL | Status: DC
  Administered 2012-11-18 – 2012-11-25 (×22): 1 via ORAL
  Filled 2012-11-18 (×26): qty 1

## 2012-11-18 MED ORDER — FUROSEMIDE 10 MG/ML IJ SOLN
80.0000 mg | Freq: Once | INTRAMUSCULAR | Status: AC
  Administered 2012-11-18: 80 mg via INTRAVENOUS
  Filled 2012-11-18: qty 8

## 2012-11-18 MED ORDER — FUROSEMIDE 10 MG/ML IJ SOLN
10.0000 mg/h | INTRAVENOUS | Status: DC
  Administered 2012-11-18 – 2012-11-20 (×3): 10 mg/h via INTRAVENOUS
  Filled 2012-11-18 (×7): qty 25

## 2012-11-18 MED ORDER — POTASSIUM CHLORIDE 20 MEQ PO PACK
20.0000 meq | PACK | Freq: Once | ORAL | Status: DC
  Filled 2012-11-18: qty 1

## 2012-11-18 NOTE — Progress Notes (Signed)
UR Chart Review Completed  

## 2012-11-18 NOTE — Progress Notes (Signed)
PATIENT DETAILS Name: [name] Age: 79 y.o. Sex: male Date of Birth: April 16, 1932 Admit Date: 11/12/2012 Admitting Physician Dewayne Shorter Levora Dredge, MD ZOX:[id number] Minda Meo, MD  Subjective: No new complaints today. Feels he is improving.  Assessment/Plan: Active Problems:  CHF (congestive heart failure) exacerbation, right sided heart failure  -Anasarca due to right sided congestive heart failure -No stigmata of cirrhosis and urinalysis shows only 30 of protein.  (unlikely nephrotic syndrome) - 2-D echocardiogram -shows slightly decreased Systolic function with EF around 45 percent - Continue beta blocker - given stage III CKD and the need for diureses-will not start ACE inhibitor-on BiDIL - Patient had right sided heart cath which revealed elevated right sided pressures consistent with RV failure. Patient has continued signs of volume overload and needs continued diuresis.  He is continued on milrinone for RV support and lasix has been changed from bid to continuous infusion.  Acute on CKD (chronic kidney disease) stage 3, GFR 30-59 ml/min  - baseline creatinine 1.6 - 1.7 - creatinine is trending up.  Will have to monitor closely with ongoing diuresis.  Normocytic Anemia -Likely secondary to chronic kidney disease.  No obvious signs of bleeding.  Uncontrolled HTN -better control -continue Coreg and BiDil.  Holding lasix for now.  Diabetes mellitus  - continue to hold oral hyperglycemic agents.  His pattern is low CBG in am. - c/w  SSI for now  - CBGs within reasonable range. -  A1c 6.0  Gout  - Stable  - Monitor for signs of flare-especially while on diuretics   Osteoarthritis  - Patient claims that he has severe osteoarthritis of his right knee-he sees a orthopedic doctor at [facility] system-he has been told that he is not an operative candidate for right knee replacement-as he has severe arthritis and a foot drop on the same side  - His right knee on exam-does not  appear to have any tenderness or erythema to suggest an infective process -X Ray right knee-show chronic changes and unchanged/stable moderate effusion  Disposition: Remain inpatient.  [facility] Home Health RN and PT ordered-when able to discharge  DVT Prophylaxis: Prophylactic Heparin  Code Status: Full code   Procedures:  2D echo - Left ventricle: The cavity size was normal. There was focal basal hypertrophy. Systolic function was mildly reduced. The estimated ejection fraction was in the range of 45% to 50%. - Mitral valve: Mild regurgitation. - Left atrium: The atrium was mildly dilated. - Right ventricle: The cavity size was mildly dilated. - Right atrium: The atrium was mildly dilated.  Cardiac Cath 3/18: Elevated right heart filling pressure out of proportion to left heart filling pressure. Primarily RV failure.       CONSULTS:  cardiology  PHYSICAL EXAM: Vital signs in last 24 hours: Filed Vitals:   11/17/12 1300 11/17/12 2115 11/18/12 0517 11/18/12 1000  BP: 140/59 135/56 144/54 117/57  Pulse: 72 75 75 77  Temp: 97.4 F (36.3 C) 98 F (36.7 C) 98.2 F (36.8 C)   TempSrc: Oral Oral Oral   Resp: 20 20 20    Height:      Weight:   113.218 kg (249 lb 9.6 oz)   SpO2: 100% 97% 97%     Weight change: -3.882 kg (-8 lb 8.9 oz) Body mass index is 32.03 kg/(m^2).   Gen Exam: Awake and alert, nad, lying in bed. Neck: Supple, No JVD.   Chest: B/L Clear.  No w/c/r, no cough CVS: S1 S2 Regular, no murmurs.  Abdomen:  soft, BS +, non tender, large ventral hernia with central scan in place Extremities: improved edema.  1-2+ bilaterally.   lower extremities warm to touch. Neurologic: Non Focal.   Skin: No Rash.   Wounds: N/A.    Intake/Output from previous day:  Intake/Output Summary (Last 24 hours) at 11/18/12 1328 Last data filed at 11/18/12 1204  Gross per 24 hour  Intake 2846.71 ml  Output   2346 ml  Net 500.71 ml     LAB RESULTS: CBC  Recent Labs Lab  11/12/12 1022 11/12/12 1758 11/16/12 0620 11/16/12 1814 11/17/12 0530  WBC 5.6 5.9 4.6 5.1 5.0  HGB 10.7* 11.9* 10.1* 10.9* 10.3*  HCT 32.4* 35.6* 30.4* 33.0* 31.4*  PLT 155 163 180 181 189  MCV 91.3 90.6 91.3 91.7 91.8  MCH 30.1 30.3 30.3 30.3 30.1  MCHC 33.0 33.4 33.2 33.0 32.8  RDW 13.6 13.6 13.8 13.6 13.9    Chemistries   Recent Labs Lab 11/14/12 0510 11/15/12 0625 11/16/12 0620 11/16/12 1814 11/17/12 0530 11/18/12 0524  NA 139 139 138  --  136 135  K 3.6 4.2 4.5  --  4.1 4.0  CL 98 99 98  --  95* 95*  CO2 30 32 31  --  31 29  GLUCOSE 117* 117* 130*  --  148* 125*  BUN 21 26* 29*  --  32* 35*  CREATININE 1.90* 2.32* 2.28* 2.13* 2.25* 2.39*  CALCIUM 9.1 8.5 8.6  --  8.7 8.6    CBG:  Recent Labs Lab 11/17/12 0628 11/17/12 1144 11/17/12 1611 11/18/12 0606 11/18/12 1059  GLUCAP 137* 182* 157* 119* 190*     Cardiac Enzymes  Recent Labs Lab 11/12/12 1758 11/12/12 2201 11/13/12 0445  TROPONINI <0.30 <0.30 <0.30     MICROBIOLOGY: Recent Results (from the past 240 hour(s))  URINE CULTURE     Status: None   Collection Time    11/12/12  1:42 PM      Result Value Range Status   Specimen Description URINE, CLEAN CATCH   Final   Special Requests NONE   Final   Culture  Setup Time 11/12/2012 14:05   Final   Colony Count NO GROWTH   Final   Culture NO GROWTH   Final   Report Status 11/13/2012 FINAL   Final    RADIOLOGY STUDIES/RESULTS: Dg Knee 1-2 Views Right  11/12/2012  *RADIOLOGY REPORT*  Clinical Data: Right knee pain and swelling.  Arthritis.  RIGHT KNEE - 1-2 VIEW  Comparison: 09/14/2012  Findings: No evidence of acute fracture or dislocation.  A moderate to large knee joint effusion is again seen, without significant change.  Osteoarthritis is again seen with severe medial compartment joint space narrowing causing tibia varus.  This is unchanged in appearance since previous study.  Peripheral vascular calcification also noted.  IMPRESSION:  1.   Stable moderate to large knee joint effusion. 2.  Stable osteoarthritis, with severe medial compartment joint space narrowing and secondary tibial varus.   Original Report Authenticated By: Myles Rosenthal, M.D.    US Abdomen Limited  11/12/2012  *RADIOLOGY REPORT*  Clinical Data: Abdominal distention.  Evaluate for ascites.  LIMITED ABDOMEN ULTRASOUND FOR ASCITES  Technique:  Limited ultrasound survey for ascites was performed in all four abdominal quadrants.  Comparison:  None.  Findings: Survey of all four abdominal quadrants shows no evidence of ascites.  IMPRESSION: No evidence of ascites.   Original Report Authenticated By: Myles Rosenthal, M.D.    Dg Chest  Port 1 View  11/12/2012  *RADIOLOGY REPORT*  Clinical Data: Shortness of breath.  PORTABLE CHEST - 1 VIEW  Comparison: PA and lateral chest 11/22/2011.  Findings: The patient is rotated on the study.  There is cardiomegaly without edema.  Lungs appear clear.  No pneumothorax or pleural fluid.  Right shoulder replacement noted.  Severe degenerative disease left shoulder is identified.  IMPRESSION: Cardiomegaly without acute disease.   Original Report Authenticated By: Holley Dexter, M.D.     MEDICATIONS: Scheduled Meds: . aspirin EC  81 mg Oral Daily  . carvedilol  12.5 mg Oral BID WC  . cyclobenzaprine  5 mg Oral BID  . cycloSPORINE  1 drop Both Eyes BID  . ergocalciferol  50,000 Units Oral Q14 Days  . gabapentin  100 mg Oral BID  . heparin  5,000 Units Subcutaneous Q8H  . insulin aspart  0-15 Units Subcutaneous TID WC  . isosorbide-hydrALAZINE  1 tablet Oral Q8H  . niacin  1,000 mg Oral QHS  . pantoprazole  40 mg Oral Daily  . polyethylene glycol  17 g Oral Daily  . sertraline  200 mg Oral QHS  . simvastatin  10 mg Oral QHS  . sodium chloride  3 mL Intravenous Q12H  . sodium chloride  3 mL Intravenous Q12H  . sodium chloride  3 mL Intravenous Q12H   Continuous Infusions: . sodium chloride 20 mL/hr (11/18/12 0830)  . furosemide  (LASIX) infusion 10 mg/hr (11/18/12 0822)  . milrinone 0.25 mcg/kg/min (11/18/12 1252)   PRN Meds:.sodium chloride, sodium chloride, sodium chloride, acetaminophen, acetaminophen, acetaminophen, Artificial Tear Ointment, bisacodyl, HYDROcodone-acetaminophen, hydroxypropyl methylcellulose, ondansetron (ZOFRAN) IV, ondansetron (ZOFRAN) IV, sodium chloride, sodium chloride, sodium chloride  Antibiotics: Anti-infectives   None       Jesalyn Finazzo, PA-C [facility] Pager:[phone]  If 7PM-7AM, please contact night-coverage [email or web address] Password [email or web address] 11/18/2012, 1:28 PM   LOS: 6 days

## 2012-11-18 NOTE — Progress Notes (Signed)
Patient ID: [name], male   DOB: 1932-05-20, 79 y.o.   MRN: [id number]    SUBJECTIVE: Feeling overall less swollen and short of breath.  He continues to lose weight but diuresis has not been vigorous.  He is on milrinone.  Creatinine mildly elevated this morning.   RHC:  RA mean 19  RV 53/16  PA 56/23, mean 38  PCWP mean 21  Oxygen saturations:  PA 53%  AO 94%  Cardiac Output (Fick) 5.05  Cardiac Index (Fick) 2.36  PVR 3.36 WU    Filed Vitals:   11/17/12 0900 11/17/12 1300 11/17/12 2115 11/18/12 0517  BP: 142/78 140/59 135/56 144/54  Pulse: 78 72 75 75  Temp:  97.4 F (36.3 C) 98 F (36.7 C) 98.2 F (36.8 C)  TempSrc:  Oral Oral Oral  Resp:  20 20 20   Height:      Weight:    249 lb 9.6 oz (113.218 kg)  SpO2:  100% 97% 97%    Intake/Output Summary (Last 24 hours) at 11/18/12 0755 Last data filed at 11/18/12 0647  Gross per 24 hour  Intake 1673.11 ml  Output   1570 ml  Net 103.11 ml    LABS: Basic Metabolic Panel:  Recent Labs  40/98/11 0530 11/18/12 0524  NA 136 135  K 4.1 4.0  CL 95* 95*  CO2 31 29  GLUCOSE 148* 125*  BUN 32* 35*  CREATININE 2.25* 2.39*  CALCIUM 8.7 8.6   Liver Function Tests: No results found for this basename: AST, ALT, ALKPHOS, BILITOT, PROT, ALBUMIN,  in the last 72 hours No results found for this basename: LIPASE, AMYLASE,  in the last 72 hours CBC:  Recent Labs  11/16/12 1814 11/17/12 0530  WBC 5.1 5.0  HGB 10.9* 10.3*  HCT 33.0* 31.4*  MCV 91.7 91.8  PLT 181 189   Cardiac Enzymes: No results found for this basename: CKTOTAL, CKMB, CKMBINDEX, TROPONINI,  in the last 72 hours BNP: No components found with this basename: POCBNP,  D-Dimer: No results found for this basename: DDIMER,  in the last 72 hours Hemoglobin A1C: No results found for this basename: HGBA1C,  in the last 72 hours Fasting Lipid Panel: No results found for this basename: CHOL, HDL, LDLCALC, TRIG, CHOLHDL, LDLDIRECT,  in the last 72  hours Thyroid Function Tests: No results found for this basename: TSH, T4TOTAL, FREET3, T3FREE, THYROIDAB,  in the last 72 hours Anemia Panel: No results found for this basename: VITAMINB12, FOLATE, FERRITIN, TIBC, IRON, RETICCTPCT,  in the last 72 hours  RADIOLOGY: Dg Knee 1-2 Views Right  11/12/2012  *RADIOLOGY REPORT*  Clinical Data: Right knee pain and swelling.  Arthritis.  RIGHT KNEE - 1-2 VIEW  Comparison: 09/14/2012  Findings: No evidence of acute fracture or dislocation.  A moderate to large knee joint effusion is again seen, without significant change.  Osteoarthritis is again seen with severe medial compartment joint space narrowing causing tibia varus.  This is unchanged in appearance since previous study.  Peripheral vascular calcification also noted.  IMPRESSION:  1.  Stable moderate to large knee joint effusion. 2.  Stable osteoarthritis, with severe medial compartment joint space narrowing and secondary tibial varus.   Original Report Authenticated By: Myles Rosenthal, M.D.    US Abdomen Limited  11/12/2012  *RADIOLOGY REPORT*  Clinical Data: Abdominal distention.  Evaluate for ascites.  LIMITED ABDOMEN ULTRASOUND FOR ASCITES  Technique:  Limited ultrasound survey for ascites was performed in all four abdominal quadrants.  Comparison:  None.  Findings: Survey of all four abdominal quadrants shows no evidence of ascites.  IMPRESSION: No evidence of ascites.   Original Report Authenticated By: Myles Rosenthal, M.D.    Dg Chest Port 1 View  11/12/2012  *RADIOLOGY REPORT*  Clinical Data: Shortness of breath.  PORTABLE CHEST - 1 VIEW  Comparison: PA and lateral chest 11/22/2011.  Findings: The patient is rotated on the study.  There is cardiomegaly without edema.  Lungs appear clear.  No pneumothorax or pleural fluid.  Right shoulder replacement noted.  Severe degenerative disease left shoulder is identified.  IMPRESSION: Cardiomegaly without acute disease.   Original Report Authenticated By: Holley Dexter, M.D.     PHYSICAL EXAM General: NAD Neck: JVP 12 cm, no thyromegaly or thyroid nodule.  Lungs: Crackles at bases CV: Nondisplaced PMI.  Heart regular S1/S2, wide split S2, no S3/S4, 2/6 systolic murmur LLSB.  1+ edema to knees.  No carotid bruit.   Abdomen: Nontender.  Hernia noted.  Distended.  Neurologic: Alert and oriented x 3.  Psych: Normal affect. Extremities: No clubbing or cyanosis.   ASSESSMENT AND PLAN:  79 yo with history of HTN, CKD, and DM admitted with CHF, primarily right-sided.  1. CHF: Acute on chronic diastolic CHF (EF 16-10% with dilated RV by echo).  On RHC, he appeared to have primarily right-sided CHF.  He does still appear volume overloaded with considerable abdominal swelling, peripheral edema, and JVD.  Weight continues to fall though I am not sure how good diuresis has been based on I/Os. Creatinine relatively stable.  Unsure why CHF is predominantly right-sided, does not appear to have history of CHF/smoking.  - Continue milrinone gtt for RV support - Will change over to Lasix gtt today after 80 mg IV bolus, BMET in pm.  2. CAD: No history of CAD.  Mildly decreased LV systolic function with CHF so cannot rule out.  No coronary angiography given elevated creatinine.  Continue ASA 81, statin.  3. Renal: Acute on chronic kidney injury.  Suspect cardiorenal picture.  On milrinone for RV support.    Marca Ancona 11/18/2012 8:03 AM

## 2012-11-18 NOTE — Progress Notes (Signed)
Pt misses Urinal at time. Found sheets wet. Out may not be acturate, ask pt if there was something i could do to help. Pt stated he could do it.

## 2012-11-19 LAB — GLUCOSE, CAPILLARY
Glucose-Capillary: 150 mg/dL — ABNORMAL HIGH (ref 70–99)
Glucose-Capillary: 174 mg/dL — ABNORMAL HIGH (ref 70–99)
Glucose-Capillary: 168 mg/dL — ABNORMAL HIGH (ref 70–99)

## 2012-11-19 LAB — BASIC METABOLIC PANEL
CO2: 31 mEq/L (ref 19–32)
Chloride: 93 mEq/L — ABNORMAL LOW (ref 96–112)
Glucose, Bld: 158 mg/dL — ABNORMAL HIGH (ref 70–99)
Sodium: 136 mEq/L (ref 135–145)

## 2012-11-19 MED ORDER — BISACODYL 10 MG RE SUPP
10.0000 mg | Freq: Once | RECTAL | Status: AC
  Administered 2012-11-19: 10 mg via RECTAL
  Filled 2012-11-19: qty 1

## 2012-11-19 NOTE — Progress Notes (Signed)
11/19/12 1545 In to complete Heart Failure Home Health Screen.  Pt. is interested in home health, and has had [facility] in past and was satisfied with their services.  TC to [place], with [facility], to give referral for [facility] RN for Heart Failure Management, and HH PT.  Pt. would like a wheelchair, due to his SOB with CHF, and a 3-N-1.  Will obtain order for DME.  NCM will continue to follow. Tera Mater, RN, BSN NCM [phone]

## 2012-11-19 NOTE — Progress Notes (Signed)
PATIENT DETAILS Name: [name] Age: 79 y.o. Sex: male Date of Birth: June 08, 1932 Admit Date: 11/12/2012 Admitting Physician Dewayne Shorter Levora Dredge, MD ZOX:[id number] Minda Meo, MD  Subjective: No complaints.  Assessment/Plan: Active Problems:  CHF (congestive heart failure) exacerbation, right sided heart failure  -Anasarca due to right sided congestive heart failure -No stigmata of cirrhosis and urinalysis shows only 30 of protein.  (unlikely nephrotic syndrome) - 2-D echocardiogram -shows slightly decreased Systolic function with EF around 45 percent - Continue beta blocker - given stage III CKD and the need for diureses-will not start ACE inhibitor-on BiDIL - Patient had right sided heart cath which revealed elevated right sided pressures consistent with RV failure. Patient has continued signs of volume overload and needs continued diuresis.  He is continued on milrinone for RV support and lasix infusion.  Urine output is good, but need to fluid restrict.  Continue current treatments to achieve further diuresis.  Acute on CKD (chronic kidney disease) stage 3, GFR 30-59 ml/min  - baseline creatinine 1.6 - 1.7 - creatinine is trending up.  Will have to monitor closely with ongoing diuresis.  Normocytic Anemia -Likely secondary to chronic kidney disease.  No obvious signs of bleeding.  Uncontrolled HTN -better control -continue Coreg and BiDil.    Diabetes mellitus  - continue to hold oral hyperglycemic agents.  His pattern is low CBG in am. - c/w  SSI for now  - CBGs within reasonable range. -  A1c 6.0  Gout  - Stable  - Monitor for signs of flare-especially while on diuretics   Osteoarthritis  - Patient claims that he has severe osteoarthritis of his right knee-he sees a orthopedic doctor at [facility] system-he has been told that he is not an operative candidate for right knee replacement-as he has severe arthritis and a foot drop on the same side  - His right knee on  exam-does not appear to have any tenderness or erythema to suggest an infective process -X Ray right knee-show chronic changes and unchanged/stable moderate effusion  Disposition: Remain inpatient.  HF Home Health RN and PT ordered-when able to discharge.  Will likely be in the hospital over the weekend.  DVT Prophylaxis: Prophylactic Heparin  Code Status: Full code   Procedures:  2D echo - Left ventricle: The cavity size was normal. There was focal basal hypertrophy. Systolic function was mildly reduced. The estimated ejection fraction was in the range of 45% to 50%. - Mitral valve: Mild regurgitation. - Left atrium: The atrium was mildly dilated. - Right ventricle: The cavity size was mildly dilated. - Right atrium: The atrium was mildly dilated.  Cardiac Cath 3/18: Elevated right heart filling pressure out of proportion to left heart filling pressure. Primarily RV failure.   CONSULTS:  [facility]  PHYSICAL EXAM: Vital signs in last 24 hours: Filed Vitals:   11/18/12 1947 11/19/12 0508 11/19/12 0923 11/19/12 1408  BP: 141/57 128/58 140/50 116/71  Pulse: 73 86 89 68  Temp: 97.4 F (36.3 C) 98.3 F (36.8 C)  98 F (36.7 C)  TempSrc: Oral Oral  Oral  Resp: 18 20  20   Height:      Weight:  112.8 kg (248 lb 10.9 oz)    SpO2: 100% 97%  97%    Weight change: -0.418 kg (-14.7 oz) Body mass index is 31.91 kg/(m^2).   Gen Exam: Awake and alert, nad, lying in bed. Neck: Supple, No JVD.   Chest: B/L Clear.  No w/c/r, no cough CVS:  S1 S2 Regular, no murmurs.  Abdomen: soft, BS +, non tender, large ventral hernia with central scan in place Extremities: improved edema.  1-2+ bilaterally.   lower extremities warm to touch. Neurologic: Non Focal.   Skin: No Rash.   Wounds: N/A.    Intake/Output from previous day:  Intake/Output Summary (Last 24 hours) at 11/19/12 1810 Last data filed at 11/19/12 1600  Gross per 24 hour  Intake   1383 ml  Output   3052 ml  Net   -1669 ml     LAB RESULTS: CBC  Recent Labs Lab 11/16/12 0620 11/16/12 1814 11/17/12 0530  WBC 4.6 5.1 5.0  HGB 10.1* 10.9* 10.3*  HCT 30.4* 33.0* 31.4*  PLT 180 181 189  MCV 91.3 91.7 91.8  MCH 30.3 30.3 30.1  MCHC 33.2 33.0 32.8  RDW 13.8 13.6 13.9    Chemistries   Recent Labs Lab 11/16/12 0620 11/16/12 1814 11/17/12 0530 11/18/12 0524 11/18/12 1400 11/19/12 0600  NA 138  --  136 135 134* 136  K 4.5  --  4.1 4.0 4.0 3.8  CL 98  --  95* 95* 93* 93*  CO2 31  --  31 29 30 31   GLUCOSE 130*  --  148* 125* 238* 158*  BUN 29*  --  32* 35* 35* 36*  CREATININE 2.28* 2.13* 2.25* 2.39* 2.38* 2.48*  CALCIUM 8.6  --  8.7 8.6 8.8 8.9    CBG:  Recent Labs Lab 11/18/12 1600 11/18/12 2106 11/19/12 0650 11/19/12 1051 11/19/12 1700  GLUCAP 189* 182* 150* 132* 174*     Cardiac Enzymes  Recent Labs Lab 11/12/12 2201 11/13/12 0445  TROPONINI <0.30 <0.30     MICROBIOLOGY: Recent Results (from the past 240 hour(s))  URINE CULTURE     Status: None   Collection Time    11/12/12  1:42 PM      Result Value Range Status   Specimen Description URINE, CLEAN CATCH   Final   Special Requests NONE   Final   Culture  Setup Time 11/12/2012 14:05   Final   Colony Count NO GROWTH   Final   Culture NO GROWTH   Final   Report Status 11/13/2012 FINAL   Final    RADIOLOGY STUDIES/RESULTS: Dg Knee 1-2 Views Right  11/12/2012  *RADIOLOGY REPORT*  Clinical Data: Right knee pain and swelling.  Arthritis.  RIGHT KNEE - 1-2 VIEW  Comparison: 09/14/2012  Findings: No evidence of acute fracture or dislocation.  A moderate to large knee joint effusion is again seen, without significant change.  Osteoarthritis is again seen with severe medial compartment joint space narrowing causing tibia varus.  This is unchanged in appearance since previous study.  Peripheral vascular calcification also noted.  IMPRESSION:  1.  Stable moderate to large knee joint effusion. 2.  Stable osteoarthritis,  with severe medial compartment joint space narrowing and secondary tibial varus.   Original Report Authenticated By: Myles Rosenthal, M.D.    US Abdomen Limited  11/12/2012  *RADIOLOGY REPORT*  Clinical Data: Abdominal distention.  Evaluate for ascites.  LIMITED ABDOMEN ULTRASOUND FOR ASCITES  Technique:  Limited ultrasound survey for ascites was performed in all four abdominal quadrants.  Comparison:  None.  Findings: Survey of all four abdominal quadrants shows no evidence of ascites.  IMPRESSION: No evidence of ascites.   Original Report Authenticated By: Myles Rosenthal, M.D.    Dg Chest Port 1 View  11/12/2012  *RADIOLOGY REPORT*  Clinical Data: Shortness of  breath.  PORTABLE CHEST - 1 VIEW  Comparison: PA and lateral chest 11/22/2011.  Findings: The patient is rotated on the study.  There is cardiomegaly without edema.  Lungs appear clear.  No pneumothorax or pleural fluid.  Right shoulder replacement noted.  Severe degenerative disease left shoulder is identified.  IMPRESSION: Cardiomegaly without acute disease.   Original Report Authenticated By: Holley Dexter, M.D.     MEDICATIONS: Scheduled Meds: . aspirin EC  81 mg Oral Daily  . carvedilol  12.5 mg Oral BID WC  . cyclobenzaprine  5 mg Oral BID  . cycloSPORINE  1 drop Both Eyes BID  . ergocalciferol  50,000 Units Oral Q14 Days  . gabapentin  100 mg Oral BID  . heparin  5,000 Units Subcutaneous Q8H  . insulin aspart  0-15 Units Subcutaneous TID WC  . isosorbide-hydrALAZINE  1 tablet Oral Q8H  . niacin  1,000 mg Oral QHS  . pantoprazole  40 mg Oral Daily  . polyethylene glycol  17 g Oral Daily  . sertraline  200 mg Oral QHS  . simvastatin  10 mg Oral QHS  . sodium chloride  3 mL Intravenous Q12H  . sodium chloride  3 mL Intravenous Q12H  . sodium chloride  3 mL Intravenous Q12H   Continuous Infusions: . sodium chloride 20 mL/hr (11/18/12 0830)  . furosemide (LASIX) infusion 10 mg/hr (11/19/12 1133)  . milrinone 0.25 mcg/kg/min  (11/19/12 1336)   PRN Meds:.sodium chloride, sodium chloride, sodium chloride, acetaminophen, acetaminophen, acetaminophen, Artificial Tear Ointment, bisacodyl, HYDROcodone-acetaminophen, hydroxypropyl methylcellulose, ondansetron (ZOFRAN) IV, ondansetron (ZOFRAN) IV, sodium chloride, sodium chloride, sodium chloride  Antibiotics: Anti-infectives   None       MEMON,JEHANZEB, PA-C [facility] Pager:[phone]  If 7PM-7AM, please contact night-coverage [email or web address] Password [email or web address] 11/19/2012, 6:10 PM   LOS: 7 days

## 2012-11-19 NOTE — Progress Notes (Signed)
Physical Therapy Treatment Patient Details Name: [name] MRN: [id number] DOB: Jan 19, 1932 Today's Date: 11/19/2012 Time: 4696-2952 PT Time Calculation (min): 27 min  PT Assessment / Plan / Recommendation Comments on Treatment Session  Continues to mobilize with supervision for safety.  Has intermittent assist available at home.  Feel fall risk with right knee deformity is minimized as much as possible with use of walker, knee brace and wearing shoes to ambulate.  Still could give away due to severe arthritis.  Will continue PT in acute setting to maximize independence in anticipation to d/c home.    Follow Up Recommendations  Home health PT;Supervision - Intermittent           Equipment Recommendations  None recommended by PT       Frequency Min 3X/week   Plan Discharge plan remains appropriate    Precautions / Restrictions Precautions Precautions: Fall Precaution Comments: right knee OA and foot drop Required Braces or Orthoses: Other Brace/Splint Other Brace/Splint: right knee velcro neoprene in night stand drawer   Pertinent Vitals/Pain C/o right knee pain with ambulation, RN aware.    Mobility  Bed Mobility Supine to Sit: 6: Modified independent (Device/Increase time);With rails Transfers Sit to Stand: From chair/3-in-1;From bed;5: Supervision Stand to Sit: To chair/3-in-1;To bed;5: Supervision Details for Transfer Assistance: increased time, supervision for safety due to extreme right knee varus deformity. Ambulation/Gait Ambulation/Gait Assistance: 5: Supervision Ambulation Distance (Feet): 180 Feet Assistive device: Rolling walker Ambulation/Gait Assistance Details: right knee brace slid down to ankle, demonstrates slow steady pace with crepitus audible right knee throughout.   Gait Pattern: Step-through pattern;Decreased stride length;Right steppage;Trunk flexed    Exercises General Exercises - Lower Extremity Long Arc Quad: AROM;Both;20 reps;Seated Hip  ABduction/ADduction: AROM;Both;20 reps;Seated Hip Flexion/Marching: AROM;Both;20 reps;Seated Heel Raises: AROM;Seated;Both;20 reps     PT Goals Acute Rehab PT Goals Pt will go Sit to Stand: with modified independence PT Goal: Sit to Stand - Progress: Progressing toward goal Pt will go Stand to Sit: with modified independence PT Goal: Stand to Sit - Progress: Progressing toward goal Pt will Transfer Bed to Chair/Chair to Bed: with modified independence PT Transfer Goal: Bed to Chair/Chair to Bed - Progress: Progressing toward goal Pt will Ambulate: with modified independence;with least restrictive assistive device;>150 feet PT Goal: Ambulate - Progress: Progressing toward goal  Visit Information  Last PT Received On: 11/19/12    Subjective Data  Subjective: I need a knee replacement, but they won't do it.   Cognition  Cognition Overall Cognitive Status: Appears within functional limits for tasks assessed/performed Arousal/Alertness: Awake/alert Orientation Level: Appears intact for tasks assessed Behavior During Session: [facility] for tasks performed    Balance     End of Session PT - End of Session Equipment Utilized During Treatment: Gait belt (right knee brace) Activity Tolerance: Patient tolerated treatment well Patient left: in chair;with call bell/phone within reach;with nursing in room Nurse Communication: Other (comment);Patient requests pain meds (IV fluids out)   GP     [facility] 11/19/2012, 12:17 PM Sheran Lawless, PT [phone] 11/19/2012

## 2012-11-19 NOTE — Progress Notes (Addendum)
SUBJECTIVE: Diuresed yesterday but input increased so output only -191.  On lasix 10 mg/hr and milrinone 0.25 mcg/kg/min  Denies dyspnea.  +lower extremity edema.  +abdominal distention   RHC:  RA mean 19  RV 53/16  PA 56/23, mean 38  PCWP mean 21  Oxygen saturations:  PA 53%  AO 94%  Cardiac Output (Fick) 5.05  Cardiac Index (Fick) 2.36  PVR 3.36 WU    Filed Vitals:   11/18/12 1432 11/18/12 1947 11/19/12 0508 11/19/12 0923  BP: 131/51 141/57 128/58 140/50  Pulse: 73 73 86 89  Temp: 97.3 F (36.3 C) 97.4 F (36.3 C) 98.3 F (36.8 C)   TempSrc: Oral Oral Oral   Resp: 20 18 20    Height:      Weight:   248 lb 10.9 oz (112.8 kg)   SpO2: 99% 100% 97%     Intake/Output Summary (Last 24 hours) at 11/19/12 1100 Last data filed at 11/19/12 0854  Gross per 24 hour  Intake 1732.36 ml  Output   3201 ml  Net -1468.64 ml    LABS: Basic Metabolic Panel:  Recent Labs  40/98/11 1400 11/19/12 0600  NA 134* 136  K 4.0 3.8  CL 93* 93*  CO2 30 31  GLUCOSE 238* 158*  BUN 35* 36*  CREATININE 2.38* 2.48*  CALCIUM 8.8 8.9   Liver Function Tests: No results found for this basename: AST, ALT, ALKPHOS, BILITOT, PROT, ALBUMIN,  in the last 72 hours No results found for this basename: LIPASE, AMYLASE,  in the last 72 hours CBC:  Recent Labs  11/16/12 1814 11/17/12 0530  WBC 5.1 5.0  HGB 10.9* 10.3*  HCT 33.0* 31.4*  MCV 91.7 91.8  PLT 181 189    RADIOLOGY: Dg Knee 1-2 Views Right  11/12/2012  *RADIOLOGY REPORT*  Clinical Data: Right knee pain and swelling.  Arthritis.  RIGHT KNEE - 1-2 VIEW  Comparison: 09/14/2012  Findings: No evidence of acute fracture or dislocation.  A moderate to large knee joint effusion is again seen, without significant change.  Osteoarthritis is again seen with severe medial compartment joint space narrowing causing tibia varus.  This is unchanged in appearance since previous study.  Peripheral vascular calcification also noted.  IMPRESSION:   1.  Stable moderate to large knee joint effusion. 2.  Stable osteoarthritis, with severe medial compartment joint space narrowing and secondary tibial varus.   Original Report Authenticated By: Myles Rosenthal, M.D.    US Abdomen Limited  11/12/2012  *RADIOLOGY REPORT*  Clinical Data: Abdominal distention.  Evaluate for ascites.  LIMITED ABDOMEN ULTRASOUND FOR ASCITES  Technique:  Limited ultrasound survey for ascites was performed in all four abdominal quadrants.  Comparison:  None.  Findings: Survey of all four abdominal quadrants shows no evidence of ascites.  IMPRESSION: No evidence of ascites.   Original Report Authenticated By: Myles Rosenthal, M.D.    Dg Chest Port 1 View  11/12/2012  *RADIOLOGY REPORT*  Clinical Data: Shortness of breath.  PORTABLE CHEST - 1 VIEW  Comparison: PA and lateral chest 11/22/2011.  Findings: The patient is rotated on the study.  There is cardiomegaly without edema.  Lungs appear clear.  No pneumothorax or pleural fluid.  Right shoulder replacement noted.  Severe degenerative disease left shoulder is identified.  IMPRESSION: Cardiomegaly without acute disease.   Original Report Authenticated By: Holley Dexter, M.D.     PHYSICAL EXAM General: NAD Neck: JVP 12 cm, no thyromegaly or thyroid nodule.  Lungs: Crackles at bases  CV: Nondisplaced PMI.  Heart regular S1/S2, wide split S2, no S3/S4, 2/6 systolic murmur LLSB.  1+ edema to knees.  No carotid bruit.   Abdomen: Nontender.  Hernia noted.  Distended.  Neurologic: Alert and oriented x 3.  Psych: Normal affect. Extremities: No clubbing or cyanosis.   ASSESSMENT AND PLAN:  79 yo with history of HTN, CKD, and DM admitted with CHF, primarily right-sided.  1. CHF: Acute on chronic diastolic CHF (EF 16-10% with dilated RV by echo).  On RHC, he appeared to have primarily right-sided CHF.  He continuous to have volume on board.  His urine output increased yesterday on lasix gtt but still with increased input.  Creatinine  relatively stable.   - Continue milrinone gtt for RV support - Continue lasix gtt.  Place fluid restrictions and low sodium diet. - Place TED hose. 2. CAD: No history of CAD.  Mildly decreased LV systolic function with CHF so cannot rule out.  No coronary angiography given elevated creatinine.  Continue ASA 81, statin.  3. Renal: Acute on chronic kidney injury.  Suspect cardiorenal picture.  Cr relatively stable, continue milrinone for RV support.    Robbi Garter, [facility] 11/19/2012 11:00 AM  Patient seen with PA, agree with the above note.  Weight is down again.  He still is volume overloaded.  He had good UOP yesterday but had a lot of fluid in also.  - fluid restrict - TED hose - Continue milrinone/Lasix gtt today, reassess over weekend.  May be able to start titrating off tomorrow or Sunday.   Marca Ancona 11/19/2012 5:12 PM

## 2012-11-20 DIAGNOSIS — I10 Essential (primary) hypertension: Secondary | ICD-10-CM

## 2012-11-20 DIAGNOSIS — M109 Gout, unspecified: Secondary | ICD-10-CM

## 2012-11-20 LAB — GLUCOSE, CAPILLARY: Glucose-Capillary: 199 mg/dL — ABNORMAL HIGH (ref 70–99)

## 2012-11-20 LAB — BASIC METABOLIC PANEL
BUN: 38 mg/dL — ABNORMAL HIGH (ref 6–23)
Calcium: 9.2 mg/dL (ref 8.4–10.5)
Creatinine, Ser: 2.74 mg/dL — ABNORMAL HIGH (ref 0.50–1.35)
GFR calc Af Amer: 24 mL/min — ABNORMAL LOW (ref >90)
GFR calc non Af Amer: 21 mL/min — ABNORMAL LOW (ref >90)

## 2012-11-20 MED ORDER — POTASSIUM CHLORIDE CRYS ER 10 MEQ PO TBCR
20.0000 meq | EXTENDED_RELEASE_TABLET | Freq: Two times a day (BID) | ORAL | Status: DC
  Filled 2012-11-20: qty 2

## 2012-11-20 MED ORDER — METOLAZONE 5 MG PO TABS
5.0000 mg | ORAL_TABLET | Freq: Once | ORAL | Status: AC
  Administered 2012-11-20: 5 mg via ORAL
  Filled 2012-11-20: qty 1

## 2012-11-20 NOTE — Progress Notes (Signed)
Subjective:  Output poor yesterday despite Lasix drip 10 mg per hour and milrinone 0.25 mcg. Net +873 mL States that dyspnea is improved.  Objective:  Vital Signs in the last 24 hours: Temp:  [97.9 F (36.6 C)-98.2 F (36.8 C)] 97.9 F (36.6 C) (03/22 0652) Pulse Rate:  [68-86] 84 (03/22 0652) Resp:  [18-20] 20 (03/22 0652) BP: (116-148)/(50-71) 138/56 mmHg (03/22 0652) SpO2:  [93 %-97 %] 93 % (03/22 0652) Weight:  [112.855 kg (248 lb 12.8 oz)] 112.855 kg (248 lb 12.8 oz) (03/22 0356)  Intake/Output from previous day: 03/21 0701 - 03/22 0700 In: 3099.8 [P.O.:1812; I.V.:1287.8] Out: 2226 [Urine:2225; Stool:1]   Physical Exam: General: NAD  Neck: JVP 12 cm, no thyromegaly or thyroid nodule.  Lungs: Crackles at bases  CV: Nondisplaced PMI. Heart regular S1/S2, wide split S2, no S3/S4, 2/6 systolic murmur LLSB. 2+ edema to knees. No carotid bruit.  Abdomen: Nontender. Hernia noted. Distended.  Neurologic: Alert and oriented x 3.  Psych: Normal affect.  Extremities: No clubbing or cyanosis.      Lab Results: No results found for this basename: WBC, HGB, PLT,  in the last 72 hours  Recent Labs  11/19/12 0600 11/20/12 0800  NA 136 135  K 3.8 4.6  CL 93* 92*  CO2 31 28  GLUCOSE 158* 183*  BUN 36* 38*  CREATININE 2.48* 2.74*     Telemetry: SR with first degree AVB, PAC's  Personally viewed.   RHC:  RA mean 19  RV 53/16  PA 56/23, mean 38  PCWP mean 21  Oxygen saturations:  PA 53%  AO 94%  Cardiac Output (Fick) 5.05  Cardiac Index (Fick) 2.36  PVR 3.36 WU    Assessment/Plan:  79 year old male with right-sided heart failure, chronic kidney disease, acute kidney injury, diabetes, mild left ventricular systolic dysfunction EF 45-50%.  1. Right-sided heart failure  - Right atrial pressures elevated. See right heart catheterization as above. He still is significantly volume overloaded with ascites.  - Unfortunately, creatinine has increased from yesterday now  2.74 from 2.48.  - Right-sided heart failure is often very sensitive to volume shifts and we are seeing manifestations of cardiorenal syndrome with worsening creatinine.  - Given his overall increase in volume however, I will continue the course with IV milrinone as well as IV Lasix with close monitoring of his creatinine. Continue with fluid restriction. Have discussed with patient. -    2. Acute kidney injury-worsening renal function as described above. We will continue to support right ventricular function with milrinone at this point. Trying  best to diminish his ascites. If creatinine continues to worsen, we may need to back down on IV Lasix. I will give him a one-time dose of metolazone 5 mg this morning. Hopefully this will promote ongoing diuresis and improve overall cardiac output.    [name] 11/20/2012, 11:28 AM

## 2012-11-20 NOTE — Progress Notes (Signed)
PATIENT DETAILS Name: [name] Age: 79 y.o. Sex: male Date of Birth: 1932/06/17 Admit Date: 11/12/2012 Admitting Physician Shanker Levora Dredge, MD [id number] Minda Meo, MD  Subjective: Patient seen, denies new complaints.  Assessment/Plan: Active Problems:  CHF (congestive heart failure) exacerbation, right sided heart failure  -Anasarca due to right sided congestive heart failure -No stigmata of cirrhosis and urinalysis shows only 30 of protein.  (unlikely nephrotic syndrome) - 2-D echocardiogram -shows slightly decreased Systolic function with EF around 45 percent - Continue beta blocker - given stage III CKD and the need for diureses-will not start ACE inhibitor-on BiDIL - Patient had right sided heart cath which revealed elevated right sided pressures consistent with RV failure. Patient has continued signs of volume overload and needs continued diuresis.  He is continued on milrinone for RV support and lasix infusion.    Acute on CKD (chronic kidney disease) stage 3, GFR 30-59 ml/min  - baseline creatinine 1.6 - 1.7 - creatinine is trending up.  Today it is 2.74  Normocytic Anemia -Likely secondary to chronic kidney disease.  No obvious signs of bleeding.  Uncontrolled HTN -better control -continue Coreg and BiDil.    Diabetes mellitus  - continue to hold oral hyperglycemic agents.  His pattern is low CBG in am. - c/w  SSI for now  - CBGs within reasonable range. -  A1c 6.0  Gout  - Stable  - Monitor for signs of flare-especially while on diuretics   Osteoarthritis  - Patient claims that he has severe osteoarthritis of his right knee-he sees a orthopedic doctor at [facility] system-he has been told that he is not an operative candidate for right knee replacement-as he has severe arthritis and a foot drop on the same side  - His right knee on exam-does not appear to have any tenderness or erythema to suggest an infective process -X Ray right knee-show chronic  changes and unchanged/stable moderate effusion  Disposition: Remain inpatient.  HF Home Health RN and PT ordered-when able to discharge.  Will likely be in the hospital over the weekend.  DVT Prophylaxis: Prophylactic Heparin  Code Status: Full code   Procedures:  2D echo - Left ventricle: The cavity size was normal. There was focal basal hypertrophy. Systolic function was mildly reduced. The estimated ejection fraction was in the range of 45% to 50%. - Mitral valve: Mild regurgitation. - Left atrium: The atrium was mildly dilated. - Right ventricle: The cavity size was mildly dilated. - Right atrium: The atrium was mildly dilated.  Cardiac Cath 3/18: Elevated right heart filling pressure out of proportion to left heart filling pressure. Primarily RV failure.   CONSULTS:  [facility]  PHYSICAL EXAM: Vital signs in last 24 hours: Filed Vitals:   11/19/12 2018 11/20/12 0121 11/20/12 0356 11/20/12 0652  BP: 148/62 137/66  138/56  Pulse: 80 72  84  Temp: 98.2 F (36.8 C) 97.9 F (36.6 C)  97.9 F (36.6 C)  TempSrc: Oral Oral  Oral  Resp: 18 20  20   Height:      Weight:   112.855 kg (248 lb 12.8 oz)   SpO2: 94% 94%  93%    Weight change: 0.055 kg (1.9 oz) Body mass index is 31.93 kg/(m^2).   Gen Exam: Appear in no acute distress Neck: Supple, No JVD.   Chest:  Clear bilaterally CVS: S1 S2 Regular, no murmurs.  Abdomen: soft, non tender, no organomegaly Extremities: improved edema.B/L  1 + edema Neurologic: AO x 3,  no focal defecit   Intake/Output from previous day:  Intake/Output Summary (Last 24 hours) at 11/20/12 1058 Last data filed at 11/20/12 0800  Gross per 24 hour  Intake 3298.6 ml  Output   1976 ml  Net 1322.6 ml     LAB RESULTS: CBC  Recent Labs Lab 11/16/12 0620 11/16/12 1814 11/17/12 0530  WBC 4.6 5.1 5.0  HGB 10.1* 10.9* 10.3*  HCT 30.4* 33.0* 31.4*  PLT 180 181 189  MCV 91.3 91.7 91.8  MCH 30.3 30.3 30.1  MCHC 33.2 33.0  32.8  RDW 13.8 13.6 13.9    Chemistries   Recent Labs Lab 11/17/12 0530 11/18/12 0524 11/18/12 1400 11/19/12 0600 11/20/12 0800  NA 136 135 134* 136 135  K 4.1 4.0 4.0 3.8 4.6  CL 95* 95* 93* 93* 92*  CO2 31 29 30 31 28   GLUCOSE 148* 125* 238* 158* 183*  BUN 32* 35* 35* 36* 38*  CREATININE 2.25* 2.39* 2.38* 2.48* 2.74*  CALCIUM 8.7 8.6 8.8 8.9 9.2    CBG:  Recent Labs Lab 11/18/12 2106 11/19/12 0650 11/19/12 1051 11/19/12 1700 11/19/12 2151  GLUCAP 182* 150* 132* 174* 168*     Cardiac Enzymes No results found for this basename: CK, CKMB, TROPONINI, MYOGLOBIN,  in the last 168 hours   MICROBIOLOGY: Recent Results (from the past 240 hour(s))  URINE CULTURE     Status: None   Collection Time    11/12/12  1:42 PM      Result Value Range Status   Specimen Description URINE, CLEAN CATCH   Final   Special Requests NONE   Final   Culture  Setup Time 11/12/2012 14:05   Final   Colony Count NO GROWTH   Final   Culture NO GROWTH   Final   Report Status 11/13/2012 FINAL   Final    RADIOLOGY STUDIES/RESULTS: Dg Knee 1-2 Views Right  11/12/2012  *RADIOLOGY REPORT*  Clinical Data: Right knee pain and swelling.  Arthritis.  RIGHT KNEE - 1-2 VIEW  Comparison: 09/14/2012    IMPRESSION:  1.  Stable moderate to large knee joint effusion. 2.  Stable osteoarthritis, with severe medial compartment joint space narrowing and secondary tibial varus.   Original Report Authenticated By: Myles Rosenthal, M.D.    US Abdomen Limited  11/12/2012  *RADIOLOGY REPORT*  Clinical Data: Abdominal distention.  Evaluate for ascites.  LIMITED ABDOMEN ULTRASOUND FOR ASCITES  Technique:  Limited ultrasound survey for ascites was performed in all four abdominal quadrants.  Comparison:  None.  Findings: Survey of all four abdominal quadrants shows no evidence of ascites.  IMPRESSION: No evidence of ascites.   Original Report Authenticated By: Myles Rosenthal, M.D.    Dg Chest Port 1 View  11/12/2012   *RADIOLOGY REPORT*  Clinical Data: Shortness of breath.  PORTABLE CHEST - 1 VIEW  Comparison: PA and lateral chest 11/22/2011.  Findings: The patient is rotated on the study.  There is cardiomegaly without edema.  Lungs appear clear.  No pneumothorax or pleural fluid.  Right shoulder replacement noted.  Severe degenerative disease left shoulder is identified.  IMPRESSION: Cardiomegaly without acute disease.   Original Report Authenticated By: Holley Dexter, M.D.     MEDICATIONS: Scheduled Meds: . aspirin EC  81 mg Oral Daily  . carvedilol  12.5 mg Oral BID WC  . cyclobenzaprine  5 mg Oral BID  . cycloSPORINE  1 drop Both Eyes BID  . ergocalciferol  50,000 Units Oral Q14 Days  .  gabapentin  100 mg Oral BID  . heparin  5,000 Units Subcutaneous Q8H  . insulin aspart  0-15 Units Subcutaneous TID WC  . isosorbide-hydrALAZINE  1 tablet Oral Q8H  . niacin  1,000 mg Oral QHS  . pantoprazole  40 mg Oral Daily  . polyethylene glycol  17 g Oral Daily  . sertraline  200 mg Oral QHS  . simvastatin  10 mg Oral QHS  . sodium chloride  3 mL Intravenous Q12H  . sodium chloride  3 mL Intravenous Q12H  . sodium chloride  3 mL Intravenous Q12H   Continuous Infusions: . sodium chloride 250 mL (11/20/12 1036)  . furosemide (LASIX) infusion 10 mg/hr (11/20/12 1037)  . milrinone 0.25 mcg/kg/min (11/20/12 1037)   PRN Meds:.sodium chloride, sodium chloride, sodium chloride, acetaminophen, acetaminophen, acetaminophen, Artificial Tear Ointment, bisacodyl, HYDROcodone-acetaminophen, hydroxypropyl methylcellulose, ondansetron (ZOFRAN) IV, ondansetron (ZOFRAN) IV, sodium chloride, sodium chloride, sodium chloride  Antibiotics: Anti-infectives   None       LAMA,GAGAN S, PA-C [facility] Pager:[phone]  If 7PM-7AM, please contact night-coverage [email or web address] Password [facility] 11/20/2012, 10:58 AM   LOS: 8 days

## 2012-11-21 DIAGNOSIS — M1990 Unspecified osteoarthritis, unspecified site: Secondary | ICD-10-CM

## 2012-11-21 LAB — BASIC METABOLIC PANEL
CO2: 30 mEq/L (ref 19–32)
Glucose, Bld: 185 mg/dL — ABNORMAL HIGH (ref 70–99)
Potassium: 4.1 mEq/L (ref 3.5–5.1)
Sodium: 132 mEq/L — ABNORMAL LOW (ref 135–145)

## 2012-11-21 LAB — GLUCOSE, CAPILLARY
Glucose-Capillary: 199 mg/dL — ABNORMAL HIGH (ref 70–99)
Glucose-Capillary: 207 mg/dL — ABNORMAL HIGH (ref 70–99)
Glucose-Capillary: 203 mg/dL — ABNORMAL HIGH (ref 70–99)

## 2012-11-21 MED ORDER — HYDROCORTISONE ACETATE 25 MG RE SUPP
25.0000 mg | Freq: Two times a day (BID) | RECTAL | Status: DC | PRN
  Filled 2012-11-21 (×2): qty 1

## 2012-11-21 NOTE — Progress Notes (Addendum)
PATIENT DETAILS Name: [name] Age: 79 y.o. Sex: male Date of Birth: February 11, 1932 Admit Date: 11/12/2012 Admitting Physician Dewayne Shorter Levora Dredge, MD [id number] Minda Meo, MD  Interval history 79 year old African American male with a past medical history of diabetes, hypertension, prior history of gastric lymphoma status post gastrectomy, severe osteoarthritis predominantly affecting his right knee , prior history of back surgery and resultant right foot drop comes to the ED for evaluation of the above-noted complaints. The patient a few weeks ago he started noticing that he was having bilateral leg edema, but slowly worsened and progressed proximally. A few days ago he claimed that he started having abdominal distention. He denies any exertional dyspnea, he chronically has 3 pillow orthopnea (for reflux), denies PND. He denies any chest pain.  He was evaluated in the emergency room, found to have anasarca, Patient has long-standing problems with his right knee and has a right foot drop. He apparently has been evaluated at the [facility] by orthopedics-and has been told that he is not a candidate for right knee replacement. Patient was started on lasix for diuresis, later cardiology was consulted and he underwent right heart cath which showed right sided CHF. Patient was started on milrinone and Iv lasix infusion and has continued to get good diuretic response.  Subjective: Patient seen,c/o tooth ache and cream for hemorrhoids.  Assessment/Plan: Active Problems:  CHF (congestive heart failure) exacerbation, right sided heart failure  -Anasarca due to right sided congestive heart failure -No stigmata of cirrhosis and urinalysis shows only 30 of protein.  (unlikely nephrotic syndrome) - 2-D echocardiogram -shows slightly decreased Systolic function with EF around 45 percent - Continue beta blocker - given stage III CKD and the need for diureses-will not start ACE inhibitor-on BiDIL - Patient  had right sided heart cath which revealed elevated right sided pressures consistent with RV failure. Patient has continued signs of volume overload and needs continued diuresis.  He is continued on milrinone for RV support and lasix infusion.    Acute on CKD (chronic kidney disease) stage 3, GFR 30-59 ml/min  - baseline creatinine 1.6 - 1.7 - creatinine is trending up.  Today it is 2.83  Normocytic Anemia -Likely secondary to chronic kidney disease.  No obvious signs of bleeding.  Uncontrolled HTN -better control -continue Coreg and BiDil.    Diabetes mellitus  - continue to hold oral hyperglycemic agents.  His pattern is low CBG in am. - c/w  SSI for now  - CBGs within reasonable range. -  A1c 6.0  Gout  - Stable  - Monitor for signs of flare-especially while on diuretics   Osteoarthritis  - Patient claims that he has severe osteoarthritis of his right knee-he sees a orthopedic doctor at [facility]-he has been told that he is not an operative candidate for right knee replacement-as he has severe arthritis and a foot drop on the same side  - His right knee on exam-does not appear to have any tenderness or erythema to suggest an infective process -X Ray right knee-show chronic changes and unchanged/stable moderate effusion  External hemorrhoids Anusol HC suppository  Tooth ache No visible lesions, he needs to follow his dentist as outpatient as he seems to have malalignment of  prosthetic upper incisors. In the meantime, we will continue him on Lortab prn.  Disposition: Remain inpatient.  HF Home Health RN and PT ordered-when able to discharge.    DVT Prophylaxis: Prophylactic Heparin  Code Status: Full code   Procedures:  2D echo - Left ventricle: The cavity size was normal. There was focal basal hypertrophy. Systolic function was mildly reduced. The estimated ejection fraction was in the range of 45% to 50%. - Mitral valve: Mild regurgitation. - Left atrium: The  atrium was mildly dilated. - Right ventricle: The cavity size was mildly dilated. - Right atrium: The atrium was mildly dilated.  Cardiac Cath 3/18: Elevated right heart filling pressure out of proportion to left heart filling pressure. Primarily RV failure.   CONSULTS:  [facility]  PHYSICAL EXAM: Vital signs in last 24 hours: Filed Vitals:   11/20/12 1358 11/20/12 1617 11/20/12 2046 11/21/12 0415  BP: 145/66  134/67 130/58  Pulse: 85 76 88 88  Temp: 97.4 F (36.3 C)  97.5 F (36.4 C) 97.9 F (36.6 C)  TempSrc: Oral  Oral Oral  Resp: 20  20 20   Height:      Weight:    112.2 kg (247 lb 5.7 oz)  SpO2: 99%  96% 95%    Weight change: -0.655 kg (-1 lb 7.1 oz) Body mass index is 31.75 kg/(m^2).   Gen Exam: Appear in no acute distress Mouth: Upper teeth examined, no lesions noted.  Neck: Supple, No JVD.   Chest:  Clear bilaterally CVS: S1 S2 Regular, no murmurs.  Abdomen: soft, non tender, no organomegaly, positive external hemorrhoids  Extremities: improved edema.B/L  1 + edema Neurologic: AO x 3, no focal defecit   Intake/Output from previous day:  Intake/Output Summary (Last 24 hours) at 11/21/12 0816 Last data filed at 11/21/12 0656  Gross per 24 hour  Intake   1387 ml  Output   2200 ml  Net   -813 ml     LAB RESULTS: CBC  Recent Labs Lab 11/16/12 0620 11/16/12 1814 11/17/12 0530  WBC 4.6 5.1 5.0  HGB 10.1* 10.9* 10.3*  HCT 30.4* 33.0* 31.4*  PLT 180 181 189  MCV 91.3 91.7 91.8  MCH 30.3 30.3 30.1  MCHC 33.2 33.0 32.8  RDW 13.8 13.6 13.9    Chemistries   Recent Labs Lab 11/18/12 0524 11/18/12 1400 11/19/12 0600 11/20/12 0800 11/21/12 0530  NA 135 134* 136 135 132*  K 4.0 4.0 3.8 4.6 4.1  CL 95* 93* 93* 92* 90*  CO2 29 30 31 28 30   GLUCOSE 125* 238* 158* 183* 185*  BUN 35* 35* 36* 38* 42*  CREATININE 2.39* 2.38* 2.48* 2.74* 2.83*  CALCIUM 8.6 8.8 8.9 9.2 8.9    CBG:  Recent Labs Lab 11/20/12 0721 11/20/12 1103  11/20/12 1615 11/20/12 2138 11/21/12 0752  GLUCAP 147* 169* 217* 199* 199*     Cardiac Enzymes No results found for this basename: CK, CKMB, TROPONINI, MYOGLOBIN,  in the last 168 hours   MICROBIOLOGY: Recent Results (from the past 240 hour(s))  URINE CULTURE     Status: None   Collection Time    11/12/12  1:42 PM      Result Value Range Status   Specimen Description URINE, CLEAN CATCH   Final   Special Requests NONE   Final   Culture  Setup Time 11/12/2012 14:05   Final   Colony Count NO GROWTH   Final   Culture NO GROWTH   Final   Report Status 11/13/2012 FINAL   Final    RADIOLOGY STUDIES/RESULTS: Dg Knee 1-2 Views Right  11/12/2012  *RADIOLOGY REPORT*  Clinical Data: Right knee pain and swelling.  Arthritis.  RIGHT KNEE - 1-2 VIEW  Comparison: 09/14/2012  IMPRESSION:  1.  Stable moderate to large knee joint effusion. 2.  Stable osteoarthritis, with severe medial compartment joint space narrowing and secondary tibial varus.   Original Report Authenticated By: Myles Rosenthal, M.D.    US Abdomen Limited  11/12/2012  *RADIOLOGY REPORT*  Clinical Data: Abdominal distention.  Evaluate for ascites.  LIMITED ABDOMEN ULTRASOUND FOR ASCITES  Technique:  Limited ultrasound survey for ascites was performed in all four abdominal quadrants.  Comparison:  None.  Findings: Survey of all four abdominal quadrants shows no evidence of ascites.  IMPRESSION: No evidence of ascites.   Original Report Authenticated By: Myles Rosenthal, M.D.    Dg Chest Port 1 View  11/12/2012  *RADIOLOGY REPORT*  Clinical Data: Shortness of breath.  PORTABLE CHEST - 1 VIEW  Comparison: PA and lateral chest 11/22/2011.  Findings: The patient is rotated on the study.  There is cardiomegaly without edema.  Lungs appear clear.  No pneumothorax or pleural fluid.  Right shoulder replacement noted.  Severe degenerative disease left shoulder is identified.  IMPRESSION: Cardiomegaly without acute disease.   Original Report  Authenticated By: Holley Dexter, M.D.     MEDICATIONS: Scheduled Meds: . aspirin EC  81 mg Oral Daily  . carvedilol  12.5 mg Oral BID WC  . cyclobenzaprine  5 mg Oral BID  . cycloSPORINE  1 drop Both Eyes BID  . ergocalciferol  50,000 Units Oral Q14 Days  . gabapentin  100 mg Oral BID  . heparin  5,000 Units Subcutaneous Q8H  . insulin aspart  0-15 Units Subcutaneous TID WC  . isosorbide-hydrALAZINE  1 tablet Oral Q8H  . niacin  1,000 mg Oral QHS  . pantoprazole  40 mg Oral Daily  . polyethylene glycol  17 g Oral Daily  . sertraline  200 mg Oral QHS  . simvastatin  10 mg Oral QHS  . sodium chloride  3 mL Intravenous Q12H  . sodium chloride  3 mL Intravenous Q12H  . sodium chloride  3 mL Intravenous Q12H   Continuous Infusions: . sodium chloride 250 mL (11/20/12 1036)  . furosemide (LASIX) infusion 10 mg/hr (11/20/12 1037)  . milrinone 0.25 mcg/kg/min (11/20/12 2355)   PRN Meds:.sodium chloride, sodium chloride, sodium chloride, Artificial Tear Ointment, bisacodyl, HYDROcodone-acetaminophen, hydrocortisone, hydroxypropyl methylcellulose, ondansetron (ZOFRAN) IV, ondansetron (ZOFRAN) IV, sodium chloride, sodium chloride, sodium chloride  Antibiotics: Anti-infectives   None       Lesleigh Hughson S, PA-C [facility] Pager:[phone]  If 7PM-7AM, please contact night-coverage [email or web address] Password [email or web address] 11/21/2012, 8:16 AM   LOS: 9 days

## 2012-11-21 NOTE — Progress Notes (Signed)
Subjective:  Feels well, no significant shortness of breath. Continued poor output despite IV Lasix. No unknown. Net +388.  Objective:  Vital Signs in the last 24 hours: Temp:  [97.4 F (36.3 C)-97.9 F (36.6 C)] 97.9 F (36.6 C) (03/23 0415) Pulse Rate:  [76-88] 88 (03/23 0415) Resp:  [20] 20 (03/23 0415) BP: (130-145)/(58-67) 130/58 mmHg (03/23 0415) SpO2:  [95 %-99 %] 95 % (03/23 0415) Weight:  [112.2 kg (247 lb 5.7 oz)] 112.2 kg (247 lb 5.7 oz) (03/23 0415)  Intake/Output from previous day: 03/22 0701 - 03/23 0700 In: 2838.2 [P.O.:1907; I.V.:931.2] Out: 2450 [Urine:2450]   Physical Exam: General: NAD  Neck: JVP 12 cm, no thyromegaly or thyroid nodule.  Lungs: Crackles at bases  CV: Nondisplaced PMI. Heart regular S1/S2, wide split S2, no S3/S4, 2/6 systolic murmur LLSB. 2+ edema to knees. No carotid bruit.  Abdomen: Nontender. Hernia noted. Distended.  Neurologic: Alert and oriented x 3.  Psych: Normal affect.  Extremities: No clubbing or cyanosis.      Lab Results:   Recent Labs  11/20/12 0800 11/21/12 0530  NA 135 132*  K 4.6 4.1  CL 92* 90*  CO2 28 30  GLUCOSE 183* 185*  BUN 38* 42*  CREATININE 2.74* 2.83*   Telemetry: No adverse arrhythmias, sinus rhythm, first degree AV block. PACs. Personally viewed.   RHC:  RA mean 19  RV 53/16  PA 56/23, mean 38  PCWP mean 21  Oxygen saturations:  PA 53%  AO 94%  Cardiac Output (Fick) 5.05  Cardiac Index (Fick) 2.36  PVR 3.36 WU   Assessment/Plan:    79 year old male with right-sided heart failure, chronic kidney disease, acute kidney injury, diabetes, mild left ventricular systolic dysfunction EF 45-50%.  1. Right-sided heart failure  - Right atrial pressures elevated. See right heart catheterization as above. He still is significantly volume overloaded with ascites but this is improved from admission. - Unfortunately, creatinine has continued to increase. increased from yesterday now 2.83 from 2.74  from 2.48.  - Right-sided heart failure is often very sensitive to volume shifts and we are seeing manifestations of cardiorenal syndrome with worsening creatinine.  - Yesterday, I tried to continue his IV Lasix and give him metolazone but his creatinine continues to worsen. - Today, I will discontinue his IV Lasix drip. I will continue with metolazone for right ventricular support. - Hopefully, we will begin does see an improvement in his overall renal function. Continue with fluid restriction. Have discussed with patient.   2. Acute kidney injury-worsening renal function as described above. We will continue to support right ventricular function with milrinone at this point. Trying best to diminish his ascites. Challenging situation.  SKAINS, MARK 11/21/2012, 12:06 PM

## 2012-11-22 ENCOUNTER — Inpatient Hospital Stay (HOSPITAL_COMMUNITY): Payer: Medicare Other

## 2012-11-22 DIAGNOSIS — N189 Chronic kidney disease, unspecified: Secondary | ICD-10-CM

## 2012-11-22 DIAGNOSIS — N179 Acute kidney failure, unspecified: Secondary | ICD-10-CM

## 2012-11-22 DIAGNOSIS — N19 Unspecified kidney failure: Secondary | ICD-10-CM

## 2012-11-22 DIAGNOSIS — I50810 Right heart failure, unspecified: Secondary | ICD-10-CM

## 2012-11-22 DIAGNOSIS — I1310 Hypertensive heart and chronic kidney disease without heart failure, with stage 1 through stage 4 chronic kidney disease, or unspecified chronic kidney disease: Secondary | ICD-10-CM

## 2012-11-22 LAB — BASIC METABOLIC PANEL
CO2: 32 mEq/L (ref 19–32)
Calcium: 8.7 mg/dL (ref 8.4–10.5)
Creatinine, Ser: 2.81 mg/dL — ABNORMAL HIGH (ref 0.50–1.35)
GFR calc non Af Amer: 20 mL/min — ABNORMAL LOW (ref >90)
Glucose, Bld: 171 mg/dL — ABNORMAL HIGH (ref 70–99)
Sodium: 132 mEq/L — ABNORMAL LOW (ref 135–145)

## 2012-11-22 LAB — GLUCOSE, CAPILLARY

## 2012-11-22 NOTE — Progress Notes (Addendum)
Weight Range   Baseline proBNP     HPI: 79 yo with history of HTN, CKD, and DM admitted with CHF, primarily right-sided. (LVEF 45-50% with mild RV dysfunction)    RHC: 11/16/12 RA mean 19  RV 53/16  PA 56/23, mean 38  PCWP mean 21  Oxygen saturations:  PA 53%  AO 94%  Cardiac Output (Fick) 5.05  Cardiac Index (Fick) 2.36  PVR 3.36 WU    Over the weekend renal function continued to trend up on Lasix drip and Milrinone. Yesterday lasix drip was discontinued. Overall his weight is down 36 pounds. Denies SOB/PND/Orthopnea.   Creatinine 2.3>2.4>2.7>2.8>2.8   ROS: All systems negative except as listed in HPI, PMH and Problem List.  Past Medical History  Diagnosis Date  . Pneumonia   . Lymphoma     s/p partial gastrectomy  . Hypertension   . Hypercholesteremia   . Diabetes mellitus   . Gout   . Irregular heart beat   . Kidney stone     Current Facility-Administered Medications  Medication Dose Route Frequency Provider Last Rate Last Dose  . 0.9 %  sodium chloride infusion  250 mL Intravenous PRN Maretta Bees, MD 10 mL/hr at 11/20/12 0800 250 mL at 11/20/12 0800  . 0.9 %  sodium chloride infusion  250 mL Intravenous PRN Rhonda G Barrett, PA-C      . 0.9 %  sodium chloride infusion   Intravenous Continuous Rhonda G Barrett, PA-C 10 mL/hr at 11/22/12 0800    . 0.9 %  sodium chloride infusion  250 mL Intravenous PRN Laurey Morale, MD      . Artificial Tear Ointment OINT 1 application  1 application Ophthalmic TID PRN Maretta Bees, MD      . aspirin EC tablet 81 mg  81 mg Oral Daily Maretta Bees, MD   81 mg at 11/21/12 0909  . bisacodyl (DULCOLAX) EC tablet 5 mg  5 mg Oral Daily PRN Maretta Bees, MD   5 mg at 11/19/12 0440  . carvedilol (COREG) tablet 12.5 mg  12.5 mg Oral BID WC Maretta Bees, MD   12.5 mg at 11/21/12 1802  . cyclobenzaprine (FLEXERIL) tablet 5 mg  5 mg Oral BID Erick Blinks, MD   5 mg at 11/21/12 2300  . cycloSPORINE (RESTASIS)  0.05 % ophthalmic emulsion 1 drop  1 drop Both Eyes BID Maretta Bees, MD   1 drop at 11/21/12 2300  . ergocalciferol (VITAMIN D2) capsule 50,000 Units  50,000 Units Oral Q14 Days Maretta Bees, MD   50,000 Units at 11/12/12 1813  . gabapentin (NEURONTIN) capsule 100 mg  100 mg Oral BID Maretta Bees, MD   100 mg at 11/21/12 2300  . heparin injection 5,000 Units  5,000 Units Subcutaneous Q8H Maretta Bees, MD   5,000 Units at 11/22/12 [phone]  . HYDROcodone-acetaminophen (NORCO/VICODIN) 5-325 MG per tablet 1-2 tablet  1-2 tablet Oral Q6H PRN Maretta Bees, MD   1 tablet at 11/21/12 2333  . hydrocortisone (ANUSOL-HC) suppository 25 mg  25 mg Rectal BID PRN Meredeth Ide, MD      . hydroxypropyl methylcellulose (ISOPTO TEARS) 2.5 % ophthalmic solution 1 drop  1 drop Both Eyes TID PRN Maretta Bees, MD      . insulin aspart (novoLOG) injection 0-15 Units  0-15 Units Subcutaneous TID WC Maretta Bees, MD   3 Units at 11/22/12 [phone]  . isosorbide-hydrALAZINE (BIDIL) 20-37.5  MG per tablet 1 tablet  1 tablet Oral Q8H Laurey Morale, MD   1 tablet at 11/21/12 2311  . milrinone (PRIMACOR) infusion 200 mcg/mL (0.2 mg/ml)  0.25 mcg/kg/min Intravenous Continuous Laurey Morale, MD 8.8 mL/hr at 11/22/12 0120 0.25 mcg/kg/min at 11/22/12 0120  . niacin tablet 1,000 mg  1,000 mg Oral QHS Maretta Bees, MD   1,000 mg at 11/21/12 2300  . ondansetron (ZOFRAN) injection 4 mg  4 mg Intravenous Q6H PRN Maretta Bees, MD   4 mg at 11/21/12 2334  . ondansetron (ZOFRAN) injection 4 mg  4 mg Intravenous Q6H PRN Laurey Morale, MD      . pantoprazole (PROTONIX) EC tablet 40 mg  40 mg Oral Daily Maretta Bees, MD   40 mg at 11/21/12 0909  . polyethylene glycol (MIRALAX / GLYCOLAX) packet 17 g  17 g Oral Daily Maretta Bees, MD   17 g at 11/21/12 0908  . sertraline (ZOLOFT) tablet 200 mg  200 mg Oral QHS Maretta Bees, MD   200 mg at 11/21/12 2300  . simvastatin (ZOCOR) tablet 10 mg   10 mg Oral QHS Maretta Bees, MD   10 mg at 11/21/12 2300  . sodium chloride 0.9 % injection 3 mL  3 mL Intravenous Q12H Maretta Bees, MD   3 mL at 11/21/12 2300  . sodium chloride 0.9 % injection 3 mL  3 mL Intravenous PRN Maretta Bees, MD      . sodium chloride 0.9 % injection 3 mL  3 mL Intravenous Q12H Rhonda G Barrett, PA-C   3 mL at 11/21/12 0925  . sodium chloride 0.9 % injection 3 mL  3 mL Intravenous PRN Rhonda G Barrett, PA-C      . sodium chloride 0.9 % injection 3 mL  3 mL Intravenous Q12H Laurey Morale, MD   3 mL at 11/21/12 0925  . sodium chloride 0.9 % injection 3 mL  3 mL Intravenous PRN Laurey Morale, MD         PHYSICAL EXAM: Filed Vitals:   11/21/12 1610 11/21/12 1405 11/21/12 2021 11/22/12 0655  BP: 130/58 123/65 133/66 139/72  Pulse: 88 68 74 79  Temp: 97.9 F (36.6 C) 98.5 F (36.9 C) 98.3 F (36.8 C) 98.3 F (36.8 C)  TempSrc: Oral Oral Oral Oral  Resp: 20 19 20 20   Height:      Weight: 247 lb 5.7 oz (112.2 kg)   242 lb 11.6 oz (110.1 kg)  SpO2: 95% 95% 96% 96%    PHYSICAL EXAM  General: NAD  Neck: JVP 10-11 cm, no thyromegaly or thyroid nodule.  Lungs: Crackles at bases  CV: Nondisplaced PMI. Heart regular S1/S2, increased P2, no S3/S4, 2/6 systolic murmur LLSB. 2+ edema to knees. No carotid bruit.  Abdomen: Nontender. Hernia noted. Distended.  Neurologic: Alert and oriented x 3.  Psych: Normal affect.  Extremities: No clubbing or cyanosis.     ASSESSMENT & PLAN: 1. A/C diastolic heart failure 45-50%  2. A/C renal failure 3.  DM 4. Osteoarthritis 5. Gout 6. HTN   Continue Milrinone at 0.25 mcg. Renal function remains 2.8. Hold diuretics today. Overall weight down 36 pounds. Add ted hose.   CLEGG,AMY 8:47 AM   Patient seen and examined with Tonye Becket, NP. We discussed all aspects of the encounter. I agree with the assessment and plan as stated above.   He has severe RHF with DM2 and  cardiorenal syndrome. He still has a  significant amount of volume on board despite 35 pound diuresis but renal function is steadily getting worse. I suspect he has significant diabetic ne[hropathy and we may not get his volume status much better than this without dialysis. Would continue milrinone today. Hold all diuretics. Place TED hose. Check renal u/s.   Donzell Coller,MD 8:51 AM

## 2012-11-22 NOTE — Progress Notes (Signed)
Patient's IV is outdated, patient is a very hard stick and IV restart attempted twice and was unsuccessful; patient currently has two IV sites that are working and flushes well.  Lorretta Harp RN

## 2012-11-22 NOTE — Progress Notes (Signed)
Physical Therapy Treatment Patient Details Name: REVEL STELLMACH MRN: [id number] DOB: Jul 08, 1932 Today's Date: 11/22/2012 Time: 4782-9562 PT Time Calculation (min): 25 min  PT Assessment / Plan / Recommendation Comments on Treatment Session  pt presents with CHF.  pt moving better today and continues to make progress.      Follow Up Recommendations  Home health PT;Supervision - Intermittent     Does the patient have the potential to tolerate intense rehabilitation     Barriers to Discharge        Equipment Recommendations  None recommended by PT    Recommendations for Other Services    Frequency Min 3X/week   Plan Discharge plan remains appropriate;Frequency remains appropriate    Precautions / Restrictions Precautions Precautions: Fall Precaution Comments: right knee OA and foot drop Required Braces or Orthoses: Other Brace/Splint Other Brace/Splint: right knee velcro neoprene in night stand drawer Restrictions Weight Bearing Restrictions: No   Pertinent Vitals/Pain Indicates arthritis pain in R knee.      Mobility  Bed Mobility Bed Mobility: Not assessed Transfers Transfers: Sit to Stand;Stand to Sit Sit to Stand: 5: Supervision;With upper extremity assist;From bed Stand to Sit: 5: Supervision;With upper extremity assist;To chair/3-in-1;With armrests Details for Transfer Assistance: cues to control descent.   Ambulation/Gait Ambulation/Gait Assistance: 5: Supervision Ambulation Distance (Feet): 200 Feet Assistive device: Rolling walker Ambulation/Gait Assistance Details: cues for upright posture and staying within RW with turns.  pt wearing R knee brace and shoes for mobility.   Gait Pattern: Step-through pattern;Decreased stride length;Right steppage;Trunk flexed Stairs: No Wheelchair Mobility Wheelchair Mobility: No    Exercises     PT Diagnosis:    PT Problem List:   PT Treatment Interventions:     PT Goals Acute Rehab PT Goals PT Goal Formulation:  With patient Time For Goal Achievement: 11/29/12 Potential to Achieve Goals: Good Pt will go Sit to Stand: with modified independence PT Goal: Sit to Stand - Progress: Goal set today Pt will go Stand to Sit: with modified independence PT Goal: Stand to Sit - Progress: Goal set today Pt will Ambulate: with modified independence;with least restrictive assistive device;>150 feet PT Goal: Ambulate - Progress: Goal set today  Visit Information  Last PT Received On: 11/22/12 Assistance Needed: +1    Subjective Data  Subjective: I want to get home.     Cognition  Cognition Overall Cognitive Status: Appears within functional limits for tasks assessed/performed Arousal/Alertness: Awake/alert Orientation Level: Appears intact for tasks assessed Behavior During Session: [facility] for tasks performed    Balance  Balance Balance Assessed: No  End of Session PT - End of Session Equipment Utilized During Treatment: Gait belt (right knee brace) Activity Tolerance: Patient tolerated treatment well Patient left: in chair;with call bell/phone within reach;with nursing in room Nurse Communication: Mobility status   GP     Sunny Schlein, [place] [phone] 11/22/2012, 2:54 PM

## 2012-11-22 NOTE — Progress Notes (Signed)
PATIENT DETAILS Name: [name] Age: 79 y.o. Sex: male Date of Birth: 06-17-1932 Admit Date: 11/12/2012 Admitting Physician Dewayne Shorter Levora Dredge, MD [id number] Minda Meo, MD  Interval history 79 year old African American male with a past medical history of diabetes, hypertension, prior history of gastric lymphoma status post gastrectomy, severe osteoarthritis predominantly affecting his right knee , prior history of back surgery and resultant right foot drop comes to the ED for evaluation of the above-noted complaints. The patient a few weeks ago he started noticing that he was having bilateral leg edema, but slowly worsened and progressed proximally. A few days ago he claimed that he started having abdominal distention. He denies any exertional dyspnea, he chronically has 3 pillow orthopnea (for reflux), denies PND. He denies any chest pain.  He was evaluated in the emergency room, found to have anasarca, Patient has long-standing problems with his right knee and has a right foot drop. He apparently has been evaluated at the [facility] by orthopedics-and has been told that he is not a candidate for right knee replacement. Patient was started on lasix for diuresis, later cardiology was consulted and he underwent right heart cath which showed right sided CHF. Patient was started on milrinone and Iv lasix infusion and has continued to get good diuretic response.  Subjective: Patient seen,c/o tooth ache and cream for hemorrhoids.  Assessment/Plan: Active Problems:  CHF (congestive heart failure) exacerbation, right sided heart failure  -Anasarca due to right sided congestive heart failure - 2-D echocardiogram -shows slightly decreased Systolic function with EF around 45 percent - Continue beta blocker - given stage III CKD and the need for diureses-will not start ACE inhibitor-on BiDIL - Patient had right sided heart cath which revealed elevated right sided pressures consistent with RV  failure. Patient has continued signs of volume overload and needs continued diuresis.  He is continued on milrinone for RV support. -Lasix has been discontinued due to worsening renal function. ?whether HD will be needed as he still has significant volume but unable to escalate diuretics due to renal function.  Acute on CKD (chronic kidney disease) stage 3, GFR 30-59 ml/min  - baseline creatinine 1.6 - 1.7 - creatinine is trending up.  Today it is 2.8. -All diuretics on hold. -Renal US ordered and pending.  Normocytic Anemia -Likely secondary to chronic kidney disease.  No obvious signs of bleeding.  Uncontrolled HTN -better control -continue Coreg and BiDil.    Diabetes mellitus  - continue to hold oral hyperglycemic agents.  His pattern is low CBG in am. - c/w  SSI for now  - CBGs within reasonable range. -  A1c 6.0  Gout  - Stable    Osteoarthritis  - Patient claims that he has severe osteoarthritis of his right knee-he sees a orthopedic doctor at [facility]-he has been told that he is not an operative candidate for right knee replacement-as he has severe arthritis and a foot drop on the same side  - His right knee on exam-does not appear to have any tenderness or erythema to suggest an infective process -X Ray right knee-show chronic changes and unchanged/stable moderate effusion  External hemorrhoids Anusol HC suppository  Disposition: Remain inpatient.  HF Home Health RN and PT ordered-when able to discharge.   Management of his CHF as per cardiology.  DVT Prophylaxis: Prophylactic Heparin  Code Status: Full code   Procedures:  2D echo - Left ventricle: The cavity size was normal. There was focal basal hypertrophy. Systolic function was mildly  reduced. The estimated ejection fraction was in the range of 45% to 50%. - Mitral valve: Mild regurgitation. - Left atrium: The atrium was mildly dilated. - Right ventricle: The cavity size was mildly dilated. - Right  atrium: The atrium was mildly dilated.  Cardiac Cath 3/18: Elevated right heart filling pressure out of proportion to left heart filling pressure. Primarily RV failure.   CONSULTS:  [place] cardiology  PHYSICAL EXAM: Vital signs in last 24 hours: Filed Vitals:   11/21/12 2021 11/22/12 0655 11/22/12 0939 11/22/12 1332  BP: 133/66 139/72 135/67 135/62  Pulse: 74 79 80 78  Temp: 98.3 F (36.8 C) 98.3 F (36.8 C) 98.4 F (36.9 C) 98.2 F (36.8 C)  TempSrc: Oral Oral Oral Oral  Resp: 20 20 20 20   Height:      Weight:  110.1 kg (242 lb 11.6 oz)    SpO2: 96% 96% 94% 99%    Weight change: -2.1 kg (-4 lb 10.1 oz) Body mass index is 31.15 kg/(m^2).   Gen Exam: Appear in no acute distress Mouth: Upper teeth examined, no lesions noted.  Neck: Supple, No JVD.   Chest:  Clear bilaterally CVS: S1 S2 Regular, no murmurs.  Abdomen: soft, non tender, no organomegaly, positive external hemorrhoids  Extremities: improved edema.B/L  1 + edema Neurologic: AO x 3, no focal defecit   Intake/Output from previous day:  Intake/Output Summary (Last 24 hours) at 11/22/12 1338 Last data filed at 11/22/12 1332  Gross per 24 hour  Intake 1874.2 ml  Output   2527 ml  Net -652.8 ml     LAB RESULTS: CBC  Recent Labs Lab 11/16/12 0620 11/16/12 1814 11/17/12 0530  WBC 4.6 5.1 5.0  HGB 10.1* 10.9* 10.3*  HCT 30.4* 33.0* 31.4*  PLT 180 181 189  MCV 91.3 91.7 91.8  MCH 30.3 30.3 30.1  MCHC 33.2 33.0 32.8  RDW 13.8 13.6 13.9    Chemistries   Recent Labs Lab 11/18/12 1400 11/19/12 0600 11/20/12 0800 11/21/12 0530 11/22/12 0620  NA 134* 136 135 132* 132*  K 4.0 3.8 4.6 4.1 3.8  CL 93* 93* 92* 90* 89*  CO2 30 31 28 30  32  GLUCOSE 238* 158* 183* 185* 171*  BUN 35* 36* 38* 42* 44*  CREATININE 2.38* 2.48* 2.74* 2.83* 2.81*  CALCIUM 8.8 8.9 9.2 8.9 8.7    CBG:  Recent Labs Lab 11/21/12 1113 11/21/12 1617 11/21/12 2053 11/22/12 0643 11/22/12 1200  GLUCAP 159* 207* 203*  163* 224*     Cardiac Enzymes No results found for this basename: CK, CKMB, TROPONINI, MYOGLOBIN,  in the last 168 hours   MICROBIOLOGY: Recent Results (from the past 240 hour(s))  URINE CULTURE     Status: None   Collection Time    11/12/12  1:42 PM      Result Value Range Status   Specimen Description URINE, CLEAN CATCH   Final   Special Requests NONE   Final   Culture  Setup Time 11/12/2012 14:05   Final   Colony Count NO GROWTH   Final   Culture NO GROWTH   Final   Report Status 11/13/2012 FINAL   Final    RADIOLOGY STUDIES/RESULTS: Dg Knee 1-2 Views Right  11/12/2012  *RADIOLOGY REPORT*  Clinical Data: Right knee pain and swelling.  Arthritis.  RIGHT KNEE - 1-2 VIEW  Comparison: 09/14/2012    IMPRESSION:  1.  Stable moderate to large knee joint effusion. 2.  Stable osteoarthritis, with severe medial  compartment joint space narrowing and secondary tibial varus.   Original Report Authenticated By: Myles Rosenthal, M.D.    US Abdomen Limited  11/12/2012  *RADIOLOGY REPORT*  Clinical Data: Abdominal distention.  Evaluate for ascites.  LIMITED ABDOMEN ULTRASOUND FOR ASCITES  Technique:  Limited ultrasound survey for ascites was performed in all four abdominal quadrants.  Comparison:  None.  Findings: Survey of all four abdominal quadrants shows no evidence of ascites.  IMPRESSION: No evidence of ascites.   Original Report Authenticated By: Myles Rosenthal, M.D.    Dg Chest Port 1 View  11/12/2012  *RADIOLOGY REPORT*  Clinical Data: Shortness of breath.  PORTABLE CHEST - 1 VIEW  Comparison: PA and lateral chest 11/22/2011.  Findings: The patient is rotated on the study.  There is cardiomegaly without edema.  Lungs appear clear.  No pneumothorax or pleural fluid.  Right shoulder replacement noted.  Severe degenerative disease left shoulder is identified.  IMPRESSION: Cardiomegaly without acute disease.   Original Report Authenticated By: Holley Dexter, M.D.     MEDICATIONS: Scheduled  Meds: . aspirin EC  81 mg Oral Daily  . carvedilol  12.5 mg Oral BID WC  . cyclobenzaprine  5 mg Oral BID  . cycloSPORINE  1 drop Both Eyes BID  . ergocalciferol  50,000 Units Oral Q14 Days  . gabapentin  100 mg Oral BID  . heparin  5,000 Units Subcutaneous Q8H  . insulin aspart  0-15 Units Subcutaneous TID WC  . isosorbide-hydrALAZINE  1 tablet Oral Q8H  . niacin  1,000 mg Oral QHS  . pantoprazole  40 mg Oral Daily  . polyethylene glycol  17 g Oral Daily  . sertraline  200 mg Oral QHS  . simvastatin  10 mg Oral QHS  . sodium chloride  3 mL Intravenous Q12H  . sodium chloride  3 mL Intravenous Q12H  . sodium chloride  3 mL Intravenous Q12H   Continuous Infusions: . sodium chloride 10 mL/hr at 11/22/12 0800  . milrinone 0.25 mcg/kg/min (11/22/12 1158)   PRN Meds:.sodium chloride, sodium chloride, sodium chloride, Artificial Tear Ointment, bisacodyl, HYDROcodone-acetaminophen, hydrocortisone, hydroxypropyl methylcellulose, ondansetron (ZOFRAN) IV, ondansetron (ZOFRAN) IV, sodium chloride, sodium chloride, sodium chloride  Antibiotics: Anti-infectives   None       Chaya Jan, MD [facility] Pager:[phone]  If 7PM-7AM, please contact night-coverage [email or web address] Password [email or web address] 11/22/2012, 1:38 PM   LOS: 10 days

## 2012-11-22 NOTE — Progress Notes (Signed)
PT Cancellation Note  Patient Details Name: LYN DEEMER MRN: [id number] DOB: 1932/01/21   Cancelled Treatment:    Reason Eval/Treat Not Completed: Patient at procedure or test/unavailable   RitenourAlison Murray, [place] [phone] 11/22/2012, 10:38 AM

## 2012-11-23 LAB — BASIC METABOLIC PANEL
CO2: 32 mEq/L (ref 19–32)
Calcium: 8.7 mg/dL (ref 8.4–10.5)
Creatinine, Ser: 2.38 mg/dL — ABNORMAL HIGH (ref 0.50–1.35)
Glucose, Bld: 231 mg/dL — ABNORMAL HIGH (ref 70–99)

## 2012-11-23 LAB — GLUCOSE, CAPILLARY
Glucose-Capillary: 225 mg/dL — ABNORMAL HIGH (ref 70–99)
Glucose-Capillary: 210 mg/dL — ABNORMAL HIGH (ref 70–99)
Glucose-Capillary: 201 mg/dL — ABNORMAL HIGH (ref 70–99)
Glucose-Capillary: 176 mg/dL — ABNORMAL HIGH (ref 70–99)

## 2012-11-23 MED ORDER — AMLODIPINE BESYLATE 5 MG PO TABS
5.0000 mg | ORAL_TABLET | Freq: Every day | ORAL | Status: DC
  Administered 2012-11-23 – 2012-11-25 (×3): 5 mg via ORAL
  Filled 2012-11-23 (×3): qty 1

## 2012-11-23 MED ORDER — TORSEMIDE 20 MG PO TABS
20.0000 mg | ORAL_TABLET | Freq: Every day | ORAL | Status: DC
  Administered 2012-11-23 – 2012-11-24 (×2): 20 mg via ORAL
  Filled 2012-11-23 (×2): qty 1

## 2012-11-23 MED ORDER — MILRINONE IN DEXTROSE 20 MG/100ML IV SOLN
0.1250 ug/kg/min | INTRAVENOUS | Status: DC
  Administered 2012-11-23 – 2012-11-24 (×2): 0.125 ug/kg/min via INTRAVENOUS
  Filled 2012-11-23 (×3): qty 100

## 2012-11-23 NOTE — Progress Notes (Signed)
Weight Range   Baseline proBNP     HPI: 79 yo with history of HTN, CKD, and DM admitted with CHF, primarily right-sided. (LVEF 45-50% with mild RV dysfunction)    RHC: 11/16/12 RA mean 19  RV 53/16  PA 56/23, mean 38  PCWP mean 21  Oxygen saturations:  PA 53%  AO 94%  Cardiac Output (Fick) 5.05  Cardiac Index (Fick) 2.36  PVR 3.36 WU    Over the weekend renal function continued to trend up on Lasix drip and Milrinone. Lasix drip was discontinued 10/24/12. Overall his weight is down 35 pounds. Denies SOB/PND/Orthopnea.   Renal Ultrasound negative for hydronephrosis. Large bilateral renal cysts with normal kidney size.  Creatinine 2.3>2.4>2.7>2.8>2.8>2.38   ROS: All systems negative except as listed in HPI, PMH and Problem List.  Past Medical History  Diagnosis Date  . Pneumonia   . Lymphoma     s/p partial gastrectomy  . Hypertension   . Hypercholesteremia   . Diabetes mellitus   . Gout   . Irregular heart beat   . Kidney stone     Current Facility-Administered Medications  Medication Dose Route Frequency Provider Last Rate Last Dose  . 0.9 %  sodium chloride infusion  250 mL Intravenous PRN Maretta Bees, MD 10 mL/hr at 11/20/12 0800 250 mL at 11/20/12 0800  . 0.9 %  sodium chloride infusion  250 mL Intravenous PRN Rhonda G Barrett, PA-C      . 0.9 %  sodium chloride infusion   Intravenous Continuous Rhonda G Barrett, PA-C 10 mL/hr at 11/23/12 0700    . 0.9 %  sodium chloride infusion  250 mL Intravenous PRN Laurey Morale, MD      . Artificial Tear Ointment OINT 1 application  1 application Ophthalmic TID PRN Maretta Bees, MD      . aspirin EC tablet 81 mg  81 mg Oral Daily Maretta Bees, MD   81 mg at 11/22/12 0940  . bisacodyl (DULCOLAX) EC tablet 5 mg  5 mg Oral Daily PRN Maretta Bees, MD   5 mg at 11/19/12 0440  . carvedilol (COREG) tablet 12.5 mg  12.5 mg Oral BID WC Maretta Bees, MD   12.5 mg at 11/23/12 0752  . cyclobenzaprine  (FLEXERIL) tablet 5 mg  5 mg Oral BID Erick Blinks, MD   5 mg at 11/22/12 2153  . cycloSPORINE (RESTASIS) 0.05 % ophthalmic emulsion 1 drop  1 drop Both Eyes BID Maretta Bees, MD   1 drop at 11/22/12 2153  . ergocalciferol (VITAMIN D2) capsule 50,000 Units  50,000 Units Oral Q14 Days Maretta Bees, MD   50,000 Units at 11/12/12 1813  . gabapentin (NEURONTIN) capsule 100 mg  100 mg Oral BID Maretta Bees, MD   100 mg at 11/22/12 2153  . heparin injection 5,000 Units  5,000 Units Subcutaneous Q8H Maretta Bees, MD   5,000 Units at 11/23/12 [phone]  . HYDROcodone-acetaminophen (NORCO/VICODIN) 5-325 MG per tablet 1-2 tablet  1-2 tablet Oral Q6H PRN Maretta Bees, MD   1 tablet at 11/23/12 0601  . hydrocortisone (ANUSOL-HC) suppository 25 mg  25 mg Rectal BID PRN Meredeth Ide, MD      . hydroxypropyl methylcellulose (ISOPTO TEARS) 2.5 % ophthalmic solution 1 drop  1 drop Both Eyes TID PRN Maretta Bees, MD      . insulin aspart (novoLOG) injection 0-15 Units  0-15 Units Subcutaneous TID WC Shanker M  Ghimire, MD   5 Units at 11/23/12 0650  . isosorbide-hydrALAZINE (BIDIL) 20-37.5 MG per tablet 1 tablet  1 tablet Oral Q8H Laurey Morale, MD   1 tablet at 11/23/12 [phone]  . milrinone [facility]) infusion 200 mcg/mL (0.2 mg/ml)  0.25 mcg/kg/min Intravenous Continuous Laurey Morale, MD 8.8 mL/hr at 11/22/12 2153 0.25 mcg/kg/min at 11/22/12 2153  . niacin tablet 1,000 mg  1,000 mg Oral QHS Maretta Bees, MD   1,000 mg at 11/22/12 2153  . ondansetron (ZOFRAN) injection 4 mg  4 mg Intravenous Q6H PRN Maretta Bees, MD   4 mg at 11/21/12 2334  . ondansetron (ZOFRAN) injection 4 mg  4 mg Intravenous Q6H PRN Laurey Morale, MD      . pantoprazole (PROTONIX) EC tablet 40 mg  40 mg Oral Daily Maretta Bees, MD   40 mg at 11/22/12 0945  . polyethylene glycol (MIRALAX / GLYCOLAX) packet 17 g  17 g Oral Daily Maretta Bees, MD   17 g at 11/22/12 [phone]  . sertraline (ZOLOFT) tablet  200 mg  200 mg Oral QHS Maretta Bees, MD   200 mg at 11/22/12 2152  . simvastatin (ZOCOR) tablet 10 mg  10 mg Oral QHS Maretta Bees, MD   10 mg at 11/22/12 2152  . sodium chloride 0.9 % injection 3 mL  3 mL Intravenous Q12H Maretta Bees, MD   3 mL at 11/21/12 2300  . sodium chloride 0.9 % injection 3 mL  3 mL Intravenous PRN Maretta Bees, MD      . sodium chloride 0.9 % injection 3 mL  3 mL Intravenous Q12H Rhonda G Barrett, PA-C   3 mL at 11/21/12 0925  . sodium chloride 0.9 % injection 3 mL  3 mL Intravenous PRN Rhonda G Barrett, PA-C      . sodium chloride 0.9 % injection 3 mL  3 mL Intravenous Q12H Laurey Morale, MD   3 mL at 11/22/12 2200  . sodium chloride 0.9 % injection 3 mL  3 mL Intravenous PRN Laurey Morale, MD         PHYSICAL EXAM: Filed Vitals:   11/22/12 2004 11/23/12 0041 11/23/12 0452 11/23/12 0700  BP: 141/66 131/66 140/70 173/82  Pulse: 71 75 78 77  Temp: 97.8 F (36.6 C)  98.3 F (36.8 C) 98.1 F (36.7 C)  TempSrc: Oral  Oral Oral  Resp: 18  18 18   Height:      Weight:   243 lb 11.2 oz (110.542 kg)   SpO2: 96%  97% 100%    PHYSICAL EXAM  General: NAD  Neck: JVP 10-11 cm, no thyromegaly or thyroid nodule.  Lungs: Crackles at bases  CV: Nondisplaced PMI. Heart regular S1/S2, increased P2, no S3/S4, 2/6 systolic murmur LLSB. 1-2+ edema to knees. Bilateral ted hose.  No carotid bruit.  Abdomen: Nontender. Hernia noted. Distended.  Neurologic: Alert and oriented x 3.  Psych: Normal affect.  Extremities: No clubbing or cyanosis.     ASSESSMENT & PLAN: 1. A/C diastolic heart failure 45-50%  2. A/C renal failure with cardiorenal syndrome 3.  DM 4. Osteoarthritis 5. Gout 6. HTN   Volume status trending up slightly (but still down 35 pounds from admit). Renal function trending down. Start to wean Milrinone today.  Add Torsemide 20 mg daily and increase 40 mg to keep I/Os even. Overall weight down 35 pounds.  Discussed need for  dialysis in the  future.     Consult cardiac rehab. Will need HHRN HHPT  CLEGG,AMY NP-C 8:28 AM  Patient seen and examined with Tonye Becket, NP. We discussed all aspects of the encounter. I agree with the assessment and plan as stated above.   He is much improved. I suspect he has a component of cardiorenal syndrome limiting full fluid removal but he is much improved. Renal function now getting better. Will begin milrinone wean and start demadex at 20 daily and titrate as needed to keep I/Os even. Hopefully stop milrinone tomorrow and home Thursday. We discussed eventual need for dialysis to optimize fluid status. Add norvasc 5 for HTN.   Continue PT.   Daniel Bensimhon,MD 8:47 AM

## 2012-11-23 NOTE — Progress Notes (Signed)
CARDIAC REHAB PHASE I   PRE:  Rate/Rhythm: 79SR 1HB  BP:  Supine:   Sitting: 152/70  Standing:    SaO2: 96%RA  MODE:  Ambulation: 300 ft   POST:  Rate/Rhythm: 88  BP:  Supine:   Sitting: 169/77  Standing:    SaO2: 97%RA 1400-1450 Pt put on knee brace and able to walk 300 ft on RA with rolling walker and asst x 1. Pt with right foot drop. Pt tolerated well. To recliner after walk. Sats good on RA. No DOE noted.    Luetta Nutting, RN BSN  11/23/2012 2:46 PM

## 2012-11-23 NOTE — Progress Notes (Signed)
Inpatient Diabetes Program Recommendations  AACE/ADA: New Consensus Statement on Inpatient Glycemic Control (2013)  Target Ranges:  Prepandial:   less than 140 mg/dL      Peak postprandial:   less than 180 mg/dL (1-2 hours)      Critically ill patients:  140 - 180 mg/dL   Reason for Visit: Hyperglycemia  Results for [name], [name] (MRN [id number]) as of 11/23/2012 13:47  Ref. Range 11/22/2012 12:00 11/22/2012 16:26 11/22/2012 17:37 11/22/2012 21:19 11/23/2012 06:14 11/23/2012 11:20  Glucose-Capillary Latest Range: 70-99 mg/dL 829 (H) 562 (H) 130 (H) 183 (H) 225 (H) 210 (H)    Inpatient Diabetes Program Recommendations Insulin - Meal Coverage: Add meal coverage insulin - Novolog 3 units tidwc Diet: Please add CHO mod med to heart healthy diet  Note: Will continue to follow.  Thank you. Ailene Ards, RD, LDN, CDE Inpatient Diabetes Coordinator [phone]

## 2012-11-23 NOTE — Progress Notes (Signed)
PATIENT DETAILS Name: [name] Age: 79 y.o. Sex: male Date of Birth: 1932-08-21 Admit Date: 11/12/2012 Admitting Physician Dewayne Shorter Levora Dredge, MD [id number] Minda Meo, MD  Interval history 79 year old African American male with a past medical history of diabetes, hypertension, prior history of gastric lymphoma status post gastrectomy, severe osteoarthritis predominantly affecting his right knee , prior history of back surgery and resultant right foot drop comes to the ED for evaluation of the above-noted complaints. The patient a few weeks ago he started noticing that he was having bilateral leg edema, but slowly worsened and progressed proximally. A few days ago he claimed that he started having abdominal distention. He denies any exertional dyspnea, he chronically has 3 pillow orthopnea (for reflux), denies PND. He denies any chest pain.  He was evaluated in the emergency room, found to have anasarca, Patient has long-standing problems with his right knee and has a right foot drop. He apparently has been evaluated at the [facility] by orthopedics-and has been told that he is not a candidate for right knee replacement. Patient was started on lasix for diuresis, later cardiology was consulted and he underwent right heart cath which showed right sided CHF. Patient was started on milrinone and Iv lasix infusion and has continued to get good diuretic response.  Subjective: Patient seen,c/o tooth ache and cream for hemorrhoids.  Assessment/Plan: Active Problems:  CHF (congestive heart failure) exacerbation, right sided heart failure  -Anasarca due to right sided congestive heart failure - 2-D echocardiogram -shows slightly decreased Systolic function with EF around 45 percent - Continue beta blocker - given stage III CKD and the need for diureses-will not start ACE inhibitor-on BiDIL - Patient had right sided heart cath which revealed elevated right sided pressures consistent with RV  failure. Patient has continued signs of volume overload and needs continued diuresis.  He is continued on milrinone for RV support. -Milrinone drip cut in half today. -Low dose demadex started.  Acute on CKD (chronic kidney disease) stage 3, GFR 30-59 ml/min  - baseline creatinine 1.6 - 1.7 - creatinine down to 2.3 today. -Follow closely now that diuretics have been restarted.  Normocytic Anemia -Likely secondary to chronic kidney disease.  No obvious signs of bleeding.  Uncontrolled HTN -better control -continue Coreg and BiDil.    Diabetes mellitus  - continue to hold oral hyperglycemic agents.  His pattern is low CBG in am. - c/w  SSI for now  - CBGs within reasonable range. -  A1c 6.0  Gout  - Stable    Osteoarthritis  - Patient claims that he has severe osteoarthritis of his right knee-he sees a orthopedic doctor at [facility]-he has been told that he is not an operative candidate for right knee replacement-as he has severe arthritis and a foot drop on the same side  - His right knee on exam-does not appear to have any tenderness or erythema to suggest an infective process -X Ray right knee-show chronic changes and unchanged/stable moderate effusion  External hemorrhoids Anusol HC suppository  Disposition: Remain inpatient.  [facility] Home Health RN and PT ordered-when able to discharge.   Management of his CHF as per cardiology.  DVT Prophylaxis: Prophylactic Heparin  Code Status: Full code   Procedures:  2D echo - Left ventricle: The cavity size was normal. There was focal basal hypertrophy. Systolic function was mildly reduced. The estimated ejection fraction was in the range of 45% to 50%. - Mitral valve: Mild regurgitation. - Left atrium: The atrium was  mildly dilated. - Right ventricle: The cavity size was mildly dilated. - Right atrium: The atrium was mildly dilated.  Cardiac Cath 3/18: Elevated right heart filling pressure out of proportion to left heart  filling pressure. Primarily RV failure.   CONSULTS:  [facility]  PHYSICAL EXAM: Vital signs in last 24 hours: Filed Vitals:   11/23/12 0452 11/23/12 0700 11/23/12 1006 11/23/12 1140  BP: 140/70 173/82 140/68 144/76  Pulse: 78 77 78 76  Temp: 98.3 F (36.8 C) 98.1 F (36.7 C) 98.2 F (36.8 C) 98.4 F (36.9 C)  TempSrc: Oral Oral Oral Oral  Resp: 18 18 16    Height:      Weight: 110.542 kg (243 lb 11.2 oz)     SpO2: 97% 100% 98%     Weight change: 0.442 kg (15.6 oz) Body mass index is 31.28 kg/(m^2).   Gen Exam: Appear in no acute distress Mouth: Upper teeth examined, no lesions noted.  Neck: Supple, No JVD.   Chest:  Clear bilaterally CVS: S1 S2 Regular, no murmurs.  Abdomen: soft, non tender, no organomegaly, positive external hemorrhoids  Extremities: improved edema.B/L  1 + edema Neurologic: AO x 3, no focal defecit   Intake/Output from previous day:  Intake/Output Summary (Last 24 hours) at 11/23/12 1356 Last data filed at 11/23/12 1054  Gross per 24 hour  Intake 1408.88 ml  Output   1727 ml  Net -318.12 ml     LAB RESULTS: CBC  Recent Labs Lab 11/16/12 1814 11/17/12 0530  WBC 5.1 5.0  HGB 10.9* 10.3*  HCT 33.0* 31.4*  PLT 181 189  MCV 91.7 91.8  MCH 30.3 30.1  MCHC 33.0 32.8  RDW 13.6 13.9    Chemistries   Recent Labs Lab 11/19/12 0600 11/20/12 0800 11/21/12 0530 11/22/12 0620 11/23/12 0640  NA 136 135 132* 132* 132*  K 3.8 4.6 4.1 3.8 4.0  CL 93* 92* 90* 89* 91*  CO2 31 28 30  32 32  GLUCOSE 158* 183* 185* 171* 231*  BUN 36* 38* 42* 44* 43*  CREATININE 2.48* 2.74* 2.83* 2.81* 2.38*  CALCIUM 8.9 9.2 8.9 8.7 8.7    CBG:  Recent Labs Lab 11/22/12 1626 11/22/12 1737 11/22/12 2119 11/23/12 0614 11/23/12 1120  GLUCAP 207* 261* 183* 225* 210*     Cardiac Enzymes No results found for this basename: CK, CKMB, TROPONINI, MYOGLOBIN,  in the last 168 hours   MICROBIOLOGY: No results found for this or any previous  visit (from the past 240 hour(s)).  RADIOLOGY STUDIES/RESULTS: Dg Knee 1-2 Views Right  11/12/2012  *RADIOLOGY REPORT*  Clinical Data: Right knee pain and swelling.  Arthritis.  RIGHT KNEE - 1-2 VIEW  Comparison: 09/14/2012    IMPRESSION:  1.  Stable moderate to large knee joint effusion. 2.  Stable osteoarthritis, with severe medial compartment joint space narrowing and secondary tibial varus.   Original Report Authenticated By: Myles Rosenthal, M.D.    US Abdomen Limited  11/12/2012  *RADIOLOGY REPORT*  Clinical Data: Abdominal distention.  Evaluate for ascites.  LIMITED ABDOMEN ULTRASOUND FOR ASCITES  Technique:  Limited ultrasound survey for ascites was performed in all four abdominal quadrants.  Comparison:  None.  Findings: Survey of all four abdominal quadrants shows no evidence of ascites.  IMPRESSION: No evidence of ascites.   Original Report Authenticated By: Myles Rosenthal, M.D.    Dg Chest Port 1 View  11/12/2012  *RADIOLOGY REPORT*  Clinical Data: Shortness of breath.  PORTABLE CHEST - 1 VIEW  Comparison: PA and lateral chest 11/22/2011.  Findings: The patient is rotated on the study.  There is cardiomegaly without edema.  Lungs appear clear.  No pneumothorax or pleural fluid.  Right shoulder replacement noted.  Severe degenerative disease left shoulder is identified.  IMPRESSION: Cardiomegaly without acute disease.   Original Report Authenticated By: Holley Dexter, M.D.     MEDICATIONS: Scheduled Meds: . amLODipine  5 mg Oral Daily  . aspirin EC  81 mg Oral Daily  . carvedilol  12.5 mg Oral BID WC  . cyclobenzaprine  5 mg Oral BID  . cycloSPORINE  1 drop Both Eyes BID  . ergocalciferol  50,000 Units Oral Q14 Days  . gabapentin  100 mg Oral BID  . heparin  5,000 Units Subcutaneous Q8H  . insulin aspart  0-15 Units Subcutaneous TID WC  . isosorbide-hydrALAZINE  1 tablet Oral Q8H  . niacin  1,000 mg Oral QHS  . pantoprazole  40 mg Oral Daily  . polyethylene glycol  17 g Oral Daily  .  sertraline  200 mg Oral QHS  . simvastatin  10 mg Oral QHS  . sodium chloride  3 mL Intravenous Q12H  . sodium chloride  3 mL Intravenous Q12H  . sodium chloride  3 mL Intravenous Q12H  . torsemide  20 mg Oral Daily   Continuous Infusions: . sodium chloride 10 mL/hr at 11/23/12 0700  . milrinone 0.125 mcg/kg/min (11/23/12 0844)   PRN Meds:.sodium chloride, sodium chloride, sodium chloride, Artificial Tear Ointment, bisacodyl, HYDROcodone-acetaminophen, hydrocortisone, hydroxypropyl methylcellulose, ondansetron (ZOFRAN) IV, ondansetron (ZOFRAN) IV, sodium chloride, sodium chloride, sodium chloride  Antibiotics: Anti-infectives   None       Chaya Jan, MD [facility] Pager:[phone]  If 7PM-7AM, please contact night-coverage [email or web address] Password [email or web address] 11/23/2012, 1:56 PM   LOS: 11 days

## 2012-11-23 NOTE — Progress Notes (Signed)
Physical Therapy Treatment Patient Details Name: [name] MRN: [id number] DOB: December 15, 1931 Today's Date: 11/23/2012 Time: 0932-1000 PT Time Calculation (min): 28 min  PT Assessment / Plan / Recommendation Comments on Treatment Session  Adm for CHF. Progressing well. Did have some trouble getting up from lower surface today but patient very aware of what works for him at home.     Follow Up Recommendations  Home health PT;Supervision - Intermittent     Does the patient have the potential to tolerate intense rehabilitation     Barriers to Discharge        Equipment Recommendations  None recommended by PT    Recommendations for Other Services    Frequency Min 3X/week   Plan Discharge plan remains appropriate;Frequency remains appropriate    Precautions / Restrictions Precautions Precautions: Fall Precaution Comments: right knee OA and foot drop Required Braces or Orthoses: Other Brace/Splint Other Brace/Splint: right knee velcro neoprene in night stand drawer Restrictions Weight Bearing Restrictions: No   Pertinent Vitals/Pain Denies pain    Mobility  Bed Mobility Bed Mobility: Not assessed Transfers Transfers: Sit to Stand;Stand to Sit Sit to Stand: 3: Mod assist;With upper extremity assist;From chair/3-in-1;With armrests Stand to Sit: 5: Supervision;With upper extremity assist;To chair/3-in-1;With armrests Details for Transfer Assistance: mod facilitation to bring trunk over BOS from lower recliner chair, took him 2-3 trials unsuccessfully before I stepped in to help him, he has good awareness and reports his chairs are not this low at home, pillows were then placed under him to ease sit->stand next time Ambulation/Gait Ambulation/Gait Assistance: 5: Supervision Ambulation Distance (Feet): 300 Feet Assistive device: Rolling walker Ambulation/Gait Assistance Details: cues for tall posture and safety with RW  Gait Pattern: Step-through pattern General Gait  Details: drop foot, significant genu varus of right knee with instability, pt wearing knee brace    Exercises General Exercises - Lower Extremity Long Arc Quad: AROM;Both;20 reps;Seated Hip Flexion/Marching: AROM;Both;20 reps;Seated Toe Raises: AROM;Both;20 reps;Seated Heel Raises: AROM;Both;20 reps;Seated     PT Goals Acute Rehab PT Goals PT Goal: Sit to Stand - Progress: Progressing toward goal PT Goal: Stand to Sit - Progress: Progressing toward goal PT Transfer Goal: Bed to Chair/Chair to Bed - Progress: Progressing toward goal PT Goal: Ambulate - Progress: Progressing toward goal  Visit Information  Last PT Received On: 11/23/12 Assistance Needed: +1    Subjective Data  Subjective: I need more pillows under me. I won't be able to get out of this chair. Patient Stated Goal: home   Cognition  Cognition Overall Cognitive Status: Appears within functional limits for tasks assessed/performed Arousal/Alertness: Awake/alert Orientation Level: Appears intact for tasks assessed    Balance     End of Session PT - End of Session Equipment Utilized During Treatment: Gait belt Activity Tolerance: Patient tolerated treatment well Patient left: in chair;with call bell/phone within reach Nurse Communication: Mobility status   GP     [facility] HELEN 11/23/2012, 12:14 PM

## 2012-11-23 NOTE — Progress Notes (Signed)
Noted new Home Health orders for Heart Failure, and pt. Is currently set up with [facility] for [facility] RN and HH PT.   Tera Mater, RN, BSN NCM [phone]

## 2012-11-24 LAB — BASIC METABOLIC PANEL
Calcium: 8.9 mg/dL (ref 8.4–10.5)
Creatinine, Ser: 2.25 mg/dL — ABNORMAL HIGH (ref 0.50–1.35)
GFR calc Af Amer: 30 mL/min — ABNORMAL LOW (ref >90)

## 2012-11-24 LAB — GLUCOSE, CAPILLARY: Glucose-Capillary: 191 mg/dL — ABNORMAL HIGH (ref 70–99)

## 2012-11-24 MED ORDER — TORSEMIDE 20 MG PO TABS
40.0000 mg | ORAL_TABLET | Freq: Every day | ORAL | Status: DC
  Administered 2012-11-25: 40 mg via ORAL
  Filled 2012-11-24 (×2): qty 2

## 2012-11-24 MED ORDER — TORSEMIDE 20 MG PO TABS
20.0000 mg | ORAL_TABLET | Freq: Once | ORAL | Status: AC
  Administered 2012-11-24: 20 mg via ORAL
  Filled 2012-11-24: qty 1

## 2012-11-24 NOTE — Progress Notes (Signed)
PATIENT DETAILS Name: [name] Age: 79 y.o. Sex: male Date of Birth: 09-27-1931 Admit Date: 11/12/2012 Admitting Physician Dewayne Shorter Levora Dredge, MD [id number] Minda Meo, MD  Interval history 79 year old African American male with a past medical history of diabetes, hypertension, prior history of gastric lymphoma status post gastrectomy, severe osteoarthritis predominantly affecting his right knee , prior history of back surgery and resultant right foot drop comes to the ED for evaluation of the above-noted complaints. The patient a few weeks ago he started noticing that he was having bilateral leg edema, but slowly worsened and progressed proximally. A few days ago he claimed that he started having abdominal distention. He denies any exertional dyspnea, he chronically has 3 pillow orthopnea (for reflux), denies PND. He denies any chest pain.  He was evaluated in the emergency room, found to have anasarca, Patient has long-standing problems with his right knee and has a right foot drop. He apparently has been evaluated at the [facility] by orthopedics-and has been told that he is not a candidate for right knee replacement. Patient was started on lasix for diuresis, later cardiology was consulted and he underwent right heart cath which showed right sided CHF. Patient was started on milrinone and Iv lasix infusion and has continued to get good diuretic response.  Subjective: Patient seen,c/o tooth ache and cream for hemorrhoids.  Assessment/Plan: Active Problems:  CHF (congestive heart failure) exacerbation, right sided heart failure  -Anasarca due to right sided congestive heart failure - 2-D echocardiogram -shows slightly decreased Systolic function with EF around 45 percent - Continue beta blocker - given stage III CKD and the need for diureses-will not start ACE inhibitor-on BiDIL - Patient had right sided heart cath which revealed elevated right sided pressures consistent with RV  failure. Patient has continued signs of volume overload and needs continued diuresis.  He is continued on milrinone for RV support. -Milrinone drip discontinued today. -Demadex increased to 40 as weight is trending up.  Acute on CKD (chronic kidney disease) stage 3, GFR 30-59 ml/min  - baseline creatinine 1.6 - 1.7 - creatinine down to 2.25 today. -Follow closely now that diuretics have been restarted. And dose increased.  Normocytic Anemia -Likely secondary to chronic kidney disease.  No obvious signs of bleeding.  Uncontrolled HTN -better control -continue Coreg and BiDil.    Diabetes mellitus  - continue to hold oral hyperglycemic agents.  His pattern is low CBG in am. - c/w  SSI for now  - CBGs within reasonable range. -  A1c 6.0  Gout  - Stable    Osteoarthritis  - Patient claims that he has severe osteoarthritis of his right knee-he sees a orthopedic doctor at [facility] system-he has been told that he is not an operative candidate for right knee replacement-as he has severe arthritis and a foot drop on the same side  - His right knee on exam-does not appear to have any tenderness or erythema to suggest an infective process -X Ray right knee-show chronic changes and unchanged/stable moderate effusion  External hemorrhoids Anusol HC suppository  Disposition: Will likely DC home in am.  DVT Prophylaxis: Prophylactic Heparin  Code Status: Full code   Procedures:  2D echo - Left ventricle: The cavity size was normal. There was focal basal hypertrophy. Systolic function was mildly reduced. The estimated ejection fraction was in the range of 45% to 50%. - Mitral valve: Mild regurgitation. - Left atrium: The atrium was mildly dilated. - Right ventricle: The cavity size was mildly  dilated. - Right atrium: The atrium was mildly dilated.  Cardiac Cath 3/18: Elevated right heart filling pressure out of proportion to left heart filling pressure. Primarily RV failure.    CONSULTS:  [facility]  PHYSICAL EXAM: Vital signs in last 24 hours: Filed Vitals:   11/24/12 0426 11/24/12 0720 11/24/12 1005 11/24/12 1115  BP: 151/65 152/73 129/64 132/67  Pulse: 72 79 71 72  Temp: 97.1 F (36.2 C) 98.2 F (36.8 C) 97.5 F (36.4 C) 97.8 F (36.6 C)  TempSrc: Oral Oral Oral Oral  Resp: 18 17    Height:      Weight: 110.814 kg (244 lb 4.8 oz)     SpO2: 96% 98%  98%    Weight change: 0.272 kg (9.6 oz) Body mass index is 31.35 kg/(m^2).   Gen Exam: Appear in no acute distress Mouth: Upper teeth examined, no lesions noted.  Neck: Supple, No JVD.   Chest:  Clear bilaterally CVS: S1 S2 Regular, no murmurs.  Abdomen: soft, non tender, no organomegaly, positive external hemorrhoids  Extremities: improved edema.B/L  1 + edema Neurologic: AO x 3, no focal defecit   Intake/Output from previous day:  Intake/Output Summary (Last 24 hours) at 11/24/12 1244 Last data filed at 11/24/12 1100  Gross per 24 hour  Intake 1852.31 ml  Output   1826 ml  Net  26.31 ml     LAB RESULTS: CBC No results found for this basename: WBC, HGB, HCT, PLT, MCV, MCH, MCHC, RDW, NEUTRABS, LYMPHSABS, MONOABS, EOSABS, BASOSABS, BANDABS, BANDSABD,  in the last 168 hours  Chemistries   Recent Labs Lab 11/20/12 0800 11/21/12 0530 11/22/12 0620 11/23/12 0640 11/24/12 0500  NA 135 132* 132* 132* 131*  K 4.6 4.1 3.8 4.0 4.3  CL 92* 90* 89* 91* 91*  CO2 28 30 32 32 30  GLUCOSE 183* 185* 171* 231* 167*  BUN 38* 42* 44* 43* 45*  CREATININE 2.74* 2.83* 2.81* 2.38* 2.25*  CALCIUM 9.2 8.9 8.7 8.7 8.9    CBG:  Recent Labs Lab 11/23/12 1120 11/23/12 1559 11/23/12 2052 11/24/12 0559 11/24/12 1145  GLUCAP 210* 201* 176* 144* 191*     Cardiac Enzymes No results found for this basename: CK, CKMB, TROPONINI, MYOGLOBIN,  in the last 168 hours   MICROBIOLOGY: No results found for this or any previous visit (from the past 240 hour(s)).  RADIOLOGY  STUDIES/RESULTS: Dg Knee 1-2 Views Right  11/12/2012  *RADIOLOGY REPORT*  Clinical Data: Right knee pain and swelling.  Arthritis.  RIGHT KNEE - 1-2 VIEW  Comparison: 09/14/2012    IMPRESSION:  1.  Stable moderate to large knee joint effusion. 2.  Stable osteoarthritis, with severe medial compartment joint space narrowing and secondary tibial varus.   Original Report Authenticated By: Myles Rosenthal, M.D.    US Abdomen Limited  11/12/2012  *RADIOLOGY REPORT*  Clinical Data: Abdominal distention.  Evaluate for ascites.  LIMITED ABDOMEN ULTRASOUND FOR ASCITES  Technique:  Limited ultrasound survey for ascites was performed in all four abdominal quadrants.  Comparison:  None.  Findings: Survey of all four abdominal quadrants shows no evidence of ascites.  IMPRESSION: No evidence of ascites.   Original Report Authenticated By: Myles Rosenthal, M.D.    Dg Chest Port 1 View  11/12/2012  *RADIOLOGY REPORT*  Clinical Data: Shortness of breath.  PORTABLE CHEST - 1 VIEW  Comparison: PA and lateral chest 11/22/2011.  Findings: The patient is rotated on the study.  There is cardiomegaly without edema.  Lungs  appear clear.  No pneumothorax or pleural fluid.  Right shoulder replacement noted.  Severe degenerative disease left shoulder is identified.  IMPRESSION: Cardiomegaly without acute disease.   Original Report Authenticated By: Holley Dexter, M.D.     MEDICATIONS: Scheduled Meds: . amLODipine  5 mg Oral Daily  . aspirin EC  81 mg Oral Daily  . carvedilol  12.5 mg Oral BID WC  . cyclobenzaprine  5 mg Oral BID  . cycloSPORINE  1 drop Both Eyes BID  . ergocalciferol  50,000 Units Oral Q14 Days  . gabapentin  100 mg Oral BID  . heparin  5,000 Units Subcutaneous Q8H  . insulin aspart  0-15 Units Subcutaneous TID WC  . isosorbide-hydrALAZINE  1 tablet Oral Q8H  . niacin  1,000 mg Oral QHS  . pantoprazole  40 mg Oral Daily  . polyethylene glycol  17 g Oral Daily  . sertraline  200 mg Oral QHS  . simvastatin  10  mg Oral QHS  . sodium chloride  3 mL Intravenous Q12H  . sodium chloride  3 mL Intravenous Q12H  . sodium chloride  3 mL Intravenous Q12H  . [START ON 11/25/2012] torsemide  40 mg Oral Daily   Continuous Infusions: . sodium chloride 10 mL/hr at 11/23/12 0700   PRN Meds:.sodium chloride, sodium chloride, sodium chloride, Artificial Tear Ointment, bisacodyl, HYDROcodone-acetaminophen, hydrocortisone, hydroxypropyl methylcellulose, ondansetron (ZOFRAN) IV, ondansetron (ZOFRAN) IV, sodium chloride, sodium chloride, sodium chloride  Antibiotics: Anti-infectives   None       Chaya Jan, MD [facility] Pager:[phone]  If 7PM-7AM, please contact night-coverage [email or web address] Password [facility] 11/24/2012, 12:44 PM   LOS: 12 days

## 2012-11-24 NOTE — Progress Notes (Signed)
Weight Range   Baseline proBNP     HPI: 79 yo with history of HTN, CKD, and DM admitted with CHF, primarily right-sided. (LVEF 45-50% with mild RV dysfunction)    RHC: 11/16/12 RA mean 19  RV 53/16  PA 56/23, mean 38  PCWP mean 21  Oxygen saturations:  PA 53%  AO 94%  Cardiac Output (Fick) 5.05  Cardiac Index (Fick) 2.36  PVR 3.36 WU    Over the weekend renal function continued to trend up on Lasix drip and Milrinone. Lasix drip was discontinued 10/24/12. Yesterday Milrinone weaned to 0.125 mcg. Overall his weight is down 34 pounds. Denies SOB/PND/Orthopnea.   Renal Ultrasound negative for hydronephrosis. Large bilateral renal cysts with normal kidney size.  Creatinine 2.3>2.4>2.7>2.8>2.8>2.38>2.25   ROS: All systems negative except as listed in HPI, PMH and Problem List.  Past Medical History  Diagnosis Date  . Pneumonia   . Lymphoma     s/p partial gastrectomy  . Hypertension   . Hypercholesteremia   . Diabetes mellitus   . Gout   . Irregular heart beat   . Kidney stone     Current Facility-Administered Medications  Medication Dose Route Frequency Provider Last Rate Last Dose  . 0.9 %  sodium chloride infusion  250 mL Intravenous PRN Maretta Bees, MD 10 mL/hr at 11/20/12 0800 250 mL at 11/20/12 0800  . 0.9 %  sodium chloride infusion  250 mL Intravenous PRN Rhonda G Barrett, PA-C      . 0.9 %  sodium chloride infusion   Intravenous Continuous Rhonda G Barrett, PA-C 10 mL/hr at 11/23/12 0700    . 0.9 %  sodium chloride infusion  250 mL Intravenous PRN Laurey Morale, MD      . amLODipine (NORVASC) tablet 5 mg  5 mg Oral Daily Dolores Patty, MD   5 mg at 11/23/12 1021  . Artificial Tear Ointment OINT 1 application  1 application Ophthalmic TID PRN Maretta Bees, MD      . aspirin EC tablet 81 mg  81 mg Oral Daily Maretta Bees, MD   81 mg at 11/23/12 1018  . bisacodyl (DULCOLAX) EC tablet 5 mg  5 mg Oral Daily PRN Maretta Bees, MD   5 mg at  11/19/12 0440  . carvedilol (COREG) tablet 12.5 mg  12.5 mg Oral BID WC Maretta Bees, MD   12.5 mg at 11/24/12 1610  . cyclobenzaprine (FLEXERIL) tablet 5 mg  5 mg Oral BID Erick Blinks, MD   5 mg at 11/23/12 2119  . cycloSPORINE (RESTASIS) 0.05 % ophthalmic emulsion 1 drop  1 drop Both Eyes BID Maretta Bees, MD   1 drop at 11/23/12 2120  . ergocalciferol (VITAMIN D2) capsule 50,000 Units  50,000 Units Oral Q14 Days Maretta Bees, MD   50,000 Units at 11/12/12 1813  . gabapentin (NEURONTIN) capsule 100 mg  100 mg Oral BID Maretta Bees, MD   100 mg at 11/23/12 2119  . heparin injection 5,000 Units  5,000 Units Subcutaneous Q8H Maretta Bees, MD   5,000 Units at 11/24/12 [phone]  . HYDROcodone-acetaminophen (NORCO/VICODIN) 5-325 MG per tablet 1-2 tablet  1-2 tablet Oral Q6H PRN Maretta Bees, MD   1 tablet at 11/24/12 0816  . hydrocortisone (ANUSOL-HC) suppository 25 mg  25 mg Rectal BID PRN Meredeth Ide, MD      . hydroxypropyl methylcellulose (ISOPTO TEARS) 2.5 % ophthalmic solution 1 drop  1  drop Both Eyes TID PRN Maretta Bees, MD      . insulin aspart (novoLOG) injection 0-15 Units  0-15 Units Subcutaneous TID WC Maretta Bees, MD   2 Units at 11/24/12 [phone]  . isosorbide-hydrALAZINE (BIDIL) 20-37.5 MG per tablet 1 tablet  1 tablet Oral Q8H Laurey Morale, MD   1 tablet at 11/24/12 [phone]  . milrinone (PRIMACOR) infusion 200 mcg/mL (0.2 mg/ml)  0.125 mcg/kg/min Intravenous Continuous Dolores Patty, MD 4.4 mL/hr at 11/24/12 0756 0.125 mcg/kg/min at 11/24/12 0756  . niacin tablet 1,000 mg  1,000 mg Oral QHS Maretta Bees, MD   1,000 mg at 11/23/12 2120  . ondansetron (ZOFRAN) injection 4 mg  4 mg Intravenous Q6H PRN Maretta Bees, MD   4 mg at 11/21/12 2334  . ondansetron (ZOFRAN) injection 4 mg  4 mg Intravenous Q6H PRN Laurey Morale, MD      . pantoprazole (PROTONIX) EC tablet 40 mg  40 mg Oral Daily Maretta Bees, MD   40 mg at 11/23/12 1020  .  polyethylene glycol (MIRALAX / GLYCOLAX) packet 17 g  17 g Oral Daily Maretta Bees, MD   17 g at 11/23/12 1015  . sertraline (ZOLOFT) tablet 200 mg  200 mg Oral QHS Maretta Bees, MD   200 mg at 11/23/12 2120  . simvastatin (ZOCOR) tablet 10 mg  10 mg Oral QHS Maretta Bees, MD   10 mg at 11/23/12 2120  . sodium chloride 0.9 % injection 3 mL  3 mL Intravenous Q12H Maretta Bees, MD   3 mL at 11/23/12 2120  . sodium chloride 0.9 % injection 3 mL  3 mL Intravenous PRN Maretta Bees, MD      . sodium chloride 0.9 % injection 3 mL  3 mL Intravenous Q12H Rhonda G Barrett, PA-C   3 mL at 11/23/12 2120  . sodium chloride 0.9 % injection 3 mL  3 mL Intravenous PRN Rhonda G Barrett, PA-C      . sodium chloride 0.9 % injection 3 mL  3 mL Intravenous Q12H Laurey Morale, MD   3 mL at 11/23/12 2120  . sodium chloride 0.9 % injection 3 mL  3 mL Intravenous PRN Laurey Morale, MD      . torsemide [facility]) tablet 20 mg  20 mg Oral Daily Dolores Patty, MD   20 mg at 11/23/12 1018     PHYSICAL EXAM: Filed Vitals:   11/23/12 1404 11/23/12 2014 11/24/12 0426 11/24/12 0720  BP: 139/61 146/70 151/65 152/73  Pulse: 75 71 72 79  Temp: 97.4 F (36.3 C) 97.5 F (36.4 C) 97.1 F (36.2 C) 98.2 F (36.8 C)  TempSrc: Oral Oral Oral Oral  Resp: 18 16 18 17   Height:      Weight:   244 lb 4.8 oz (110.814 kg)   SpO2: 98% 94% 96% 98%    PHYSICAL EXAM  General: NAD  Neck: JVP 10-11 cm, no thyromegaly or thyroid nodule.  Lungs: Crackles at bases  CV: Nondisplaced PMI. Heart regular S1/S2, increased P2, no S3/S4, 2/6 systolic murmur LLSB. 1+ edema to knees. Bilateral ted hose.  No carotid bruit.  Abdomen: Nontender. Hernia noted. Distended.  Neurologic: Alert and oriented x 3.  Psych: Normal affect.  Extremities: No clubbing or cyanosis.     ASSESSMENT & PLAN: 1. A/C diastolic heart failure 45-50%  2. A/C renal failure with cardiorenal syndrome 3.  DM 4.  Osteoarthritis 5.  Gout 6. HTN   Volume status ok. Renal function trending down. Stop Milrinone today.  Continue current diuretic dose. Overall weight down 34 pounds.  Discussed need for dialysis in the future.     Consult cardiac rehab. Will need HHRN HHPT  CLEGG,AMY NP-C 8:38 AM  Patient seen and examined with Tonye Becket, NP. We discussed all aspects of the encounter. I agree with the assessment and plan as stated above.   He continues to improve. However weight trending up. Agree with stopping milrinone. Increase demadex to 40 daily.   Possibly home tomorrow.   Daniel Bensimhon,MD 10:17 AM

## 2012-11-25 LAB — BASIC METABOLIC PANEL
BUN: 46 mg/dL — ABNORMAL HIGH (ref 6–23)
Chloride: 92 mEq/L — ABNORMAL LOW (ref 96–112)
GFR calc Af Amer: 31 mL/min — ABNORMAL LOW (ref >90)
Potassium: 4.6 mEq/L (ref 3.5–5.1)

## 2012-11-25 LAB — GLUCOSE, CAPILLARY: Glucose-Capillary: 145 mg/dL — ABNORMAL HIGH (ref 70–99)

## 2012-11-25 MED ORDER — TORSEMIDE 20 MG PO TABS: 40.0000 mg | ORAL_TABLET | Freq: Every day | ORAL | Status: DC

## 2012-11-25 MED ORDER — ISOSORB DINITRATE-HYDRALAZINE 20-37.5 MG PO TABS: 1.0000 | ORAL_TABLET | Freq: Three times a day (TID) | ORAL | Status: DC

## 2012-11-25 MED ORDER — AMLODIPINE BESYLATE 5 MG PO TABS: 5.0000 mg | ORAL_TABLET | Freq: Every day | ORAL | Status: DC

## 2012-11-25 NOTE — Progress Notes (Signed)
Weight Range   Baseline proBNP     HPI: 79 yo with history of HTN, CKD, and DM admitted with CHF, primarily right-sided. (LVEF 45-50% with mild RV dysfunction)    RHC: 11/16/12 RA mean 19  RV 53/16  PA 56/23, mean 38  PCWP mean 21  Oxygen saturations:  PA 53%  AO 94%  Cardiac Output (Fick) 5.05  Cardiac Index (Fick) 2.36  PVR 3.36 WU    Over the weekend renal function continued to trend up on Lasix drip and Milrinone. Lasix drip was discontinued 10/24/12. Yesterday Milrinone stopped. Overall his weight is down 35 pounds. Denies SOB/PND/Orthopnea.    Renal Ultrasound negative for hydronephrosis. Large bilateral renal cysts with normal kidney size.  Creatinine 2.3>2.4>2.7>2.8>2.8>2.38>2.25>2.22   ROS: All systems negative except as listed in HPI, PMH and Problem List.  Past Medical History  Diagnosis Date  . Pneumonia   . Lymphoma     s/p partial gastrectomy  . Hypertension   . Hypercholesteremia   . Diabetes mellitus   . Gout   . Irregular heart beat   . Kidney stone     Current Facility-Administered Medications  Medication Dose Route Frequency Provider Last Rate Last Dose  . 0.9 %  sodium chloride infusion  250 mL Intravenous PRN Maretta Bees, MD 10 mL/hr at 11/20/12 0800 250 mL at 11/20/12 0800  . 0.9 %  sodium chloride infusion  250 mL Intravenous PRN Rhonda G Barrett, PA-C      . 0.9 %  sodium chloride infusion   Intravenous Continuous Rhonda G Barrett, PA-C 10 mL/hr at 11/23/12 0700    . 0.9 %  sodium chloride infusion  250 mL Intravenous PRN Laurey Morale, MD      . amLODipine (NORVASC) tablet 5 mg  5 mg Oral Daily Dolores Patty, MD   5 mg at 11/24/12 1017  . Artificial Tear Ointment OINT 1 application  1 application Ophthalmic TID PRN Maretta Bees, MD      . aspirin EC tablet 81 mg  81 mg Oral Daily Maretta Bees, MD   81 mg at 11/24/12 1017  . bisacodyl (DULCOLAX) EC tablet 5 mg  5 mg Oral Daily PRN Maretta Bees, MD   5 mg at  11/19/12 0440  . carvedilol (COREG) tablet 12.5 mg  12.5 mg Oral BID WC Maretta Bees, MD   12.5 mg at 11/24/12 1720  . cyclobenzaprine (FLEXERIL) tablet 5 mg  5 mg Oral BID Erick Blinks, MD   5 mg at 11/24/12 2103  . cycloSPORINE (RESTASIS) 0.05 % ophthalmic emulsion 1 drop  1 drop Both Eyes BID Maretta Bees, MD   1 drop at 11/24/12 2104  . ergocalciferol (VITAMIN D2) capsule 50,000 Units  50,000 Units Oral Q14 Days Maretta Bees, MD   50,000 Units at 11/12/12 1813  . gabapentin (NEURONTIN) capsule 100 mg  100 mg Oral BID Maretta Bees, MD   100 mg at 11/24/12 2103  . heparin injection 5,000 Units  5,000 Units Subcutaneous Q8H Maretta Bees, MD   5,000 Units at 11/25/12 0615  . HYDROcodone-acetaminophen (NORCO/VICODIN) 5-325 MG per tablet 1-2 tablet  1-2 tablet Oral Q6H PRN Maretta Bees, MD   2 tablet at 11/24/12 2343  . hydrocortisone (ANUSOL-HC) suppository 25 mg  25 mg Rectal BID PRN Meredeth Ide, MD      . hydroxypropyl methylcellulose (ISOPTO TEARS) 2.5 % ophthalmic solution 1 drop  1 drop Both  Eyes TID PRN Maretta Bees, MD      . insulin aspart (novoLOG) injection 0-15 Units  0-15 Units Subcutaneous TID WC Maretta Bees, MD   2 Units at 11/25/12 0615  . isosorbide-hydrALAZINE (BIDIL) 20-37.5 MG per tablet 1 tablet  1 tablet Oral Q8H Laurey Morale, MD   1 tablet at 11/24/12 2343  . niacin tablet 1,000 mg  1,000 mg Oral QHS Maretta Bees, MD   1,000 mg at 11/24/12 2103  . ondansetron (ZOFRAN) injection 4 mg  4 mg Intravenous Q6H PRN Maretta Bees, MD   4 mg at 11/24/12 2011  . ondansetron (ZOFRAN) injection 4 mg  4 mg Intravenous Q6H PRN Laurey Morale, MD      . pantoprazole (PROTONIX) EC tablet 40 mg  40 mg Oral Daily Maretta Bees, MD   40 mg at 11/24/12 1019  . polyethylene glycol (MIRALAX / GLYCOLAX) packet 17 g  17 g Oral Daily Maretta Bees, MD   17 g at 11/24/12 1015  . sertraline (ZOLOFT) tablet 200 mg  200 mg Oral QHS Maretta Bees, MD   200 mg at 11/24/12 2103  . simvastatin (ZOCOR) tablet 10 mg  10 mg Oral QHS Maretta Bees, MD   10 mg at 11/24/12 2103  . sodium chloride 0.9 % injection 3 mL  3 mL Intravenous Q12H Maretta Bees, MD   3 mL at 11/23/12 2120  . sodium chloride 0.9 % injection 3 mL  3 mL Intravenous PRN Maretta Bees, MD      . sodium chloride 0.9 % injection 3 mL  3 mL Intravenous Q12H Rhonda G Barrett, PA-C   3 mL at 11/24/12 2012  . sodium chloride 0.9 % injection 3 mL  3 mL Intravenous PRN Rhonda G Barrett, PA-C      . sodium chloride 0.9 % injection 3 mL  3 mL Intravenous Q12H Laurey Morale, MD   3 mL at 11/23/12 2120  . sodium chloride 0.9 % injection 3 mL  3 mL Intravenous PRN Laurey Morale, MD      . torsemide [facility]) tablet 40 mg  40 mg Oral Daily Dolores Patty, MD         PHYSICAL EXAM: Filed Vitals:   11/24/12 1300 11/24/12 2048 11/24/12 2341 11/25/12 0450  BP: 130/65 151/87 150/89 151/86  Pulse: 76 76 78 77  Temp: 97.6 F (36.4 C) 97.9 F (36.6 C)  97.6 F (36.4 C)  TempSrc: Oral Oral  Oral  Resp: 18 18  19   Height:      Weight:    243 lb 2.7 oz (110.3 kg)  SpO2: 100% 95%  95%    PHYSICAL EXAM  General: NAD  Neck: JVP 9-10 cm, no thyromegaly or thyroid nodule.  Lungs: Crackles at bases  CV: Nondisplaced PMI. Heart regular S1/S2, increased P2, no S3/S4, 2/6 systolic murmur LLSB. 1+ edema to knees. Bilateral ted hose.  No carotid bruit.  Abdomen: Nontender. Hernia noted. Distended.  Neurologic: Alert and oriented x 3.  Psych: Normal affect.  Extremities: No clubbing or cyanosis.     ASSESSMENT & PLAN: 1. A/C diastolic heart failure 45-50%  2. A/C renal failure with cardiorenal syndrome 3.  DM 4. Osteoarthritis 5. Gout 6. HTN   Volume status ok. Renal function trending down. Continue current diuretic dose. Overall weight down 35 pounds. Follow up in HF clinic next week. 11/30/2012 at 3:15.   CLEGG,AMY  NP-C 6:52 AM  Patient seen and  examined with Tonye Becket, NP. We discussed all aspects of the encounter. I agree with the assessment and plan as stated above.   He looks good. Renal function stable. Ok for d/c on current regimen. We will see next week in HF clinic.  Daniel Bensimhon,MD 7:48 AM

## 2012-11-25 NOTE — Discharge Summary (Signed)
Physician Discharge Summary  Patient ID: [name] MRN: [id number] DOB/AGE: 1931-12-16 79 y.o.  Admit date: 11/12/2012 Discharge date: 11/25/2012  Primary Care Physician:  Eino Farber, MD   Discharge Diagnoses:    Active Problems:   Diabetes mellitus   Gout   CKD (chronic kidney disease) stage 3, GFR 30-59 ml/min   Osteoarthritis   CHF (congestive heart failure)   Right heart failure   Cardiorenal syndrome with renal failure   Acute on chronic renal failure      Medication List    STOP taking these medications       amoxicillin 500 MG capsule  Commonly known as:  AMOXIL     ciprofloxacin 500 MG tablet  Commonly known as:  CIPRO     furosemide 20 MG tablet  Commonly known as:  LASIX     pantoprazole 40 MG tablet  Commonly known as:  PROTONIX     pioglitazone 30 MG tablet  Commonly known as:  ACTOS     sulfamethoxazole-trimethoprim 800-160 MG per tablet  Commonly known as:  BACTRIM DS      TAKE these medications       acetaminophen 325 MG tablet  Commonly known as:  TYLENOL  Take 325 mg by mouth every 6 (six) hours as needed. For pain     albuterol 108 (90 BASE) MCG/ACT inhaler  Commonly known as:  PROVENTIL HFA;VENTOLIN HFA  Inhale 2 puffs into the lungs every 6 (six) hours as needed for wheezing.     amLODipine 5 MG tablet  Commonly known as:  NORVASC  Take 1 tablet (5 mg total) by mouth daily.     Artificial Tear Ointment Oint  Apply 1 application to eye 3 (three) times daily as needed (for dry eyes).     aspirin EC 81 MG tablet  Take 81 mg by mouth daily.     bisacodyl 5 MG EC tablet  Commonly known as:  DULCOLAX  Take 5 mg by mouth daily as needed. For constipation     carvedilol 12.5 MG tablet  Commonly known as:  COREG  Take 12.5 mg by mouth 2 (two) times daily with a meal.     cyclobenzaprine 10 MG tablet  Commonly known as:  FLEXERIL  Take 10 mg by mouth 3 (three) times daily as needed. For muscle spasms     cycloSPORINE 0.05 % ophthalmic emulsion  Commonly known as:  RESTASIS  Place 1 drop into both eyes 2 (two) times daily.     ergocalciferol 50000 UNITS capsule  Commonly known as:  VITAMIN D2  Take 50,000 Units by mouth every 14 (fourteen) days.     gabapentin 100 MG capsule  Commonly known as:  NEURONTIN  Take 100 mg by mouth 2 (two) times daily.     glipiZIDE 10 MG tablet  Commonly known as:  GLUCOTROL  Take 10 mg by mouth daily.     HYDROcodone-acetaminophen 5-325 MG per tablet  Commonly known as:  NORCO/VICODIN  Take 1 tablet by mouth every 4 (four) hours as needed for pain.     hydroxypropyl methylcellulose 2.5 % ophthalmic solution  Commonly known as:  ISOPTO TEARS  Place 1 drop into both eyes 3 (three) times daily as needed (dfry eyes).     isosorbide-hydrALAZINE 20-37.5 MG per tablet  Commonly known as:  BIDIL  Take 1 tablet by mouth every 8 (eight) hours.     lidocaine 4 % cream  Commonly known as:  LMX  Apply 1 application topically as needed (via atomizer).     niacin 500 MG tablet  Take 1,000 mg by mouth at bedtime.     omeprazole 20 MG capsule  Commonly known as:  PRILOSEC  Take 20 mg by mouth 2 (two) times daily.     polyethylene glycol packet  Commonly known as:  MIRALAX / GLYCOLAX  Take 17 g by mouth daily as needed. For constipation     senna 8.6 MG tablet  Commonly known as:  SENOKOT  Take 2 tablets by mouth 2 (two) times daily.     sertraline 100 MG tablet  Commonly known as:  ZOLOFT  Take 200 mg by mouth at bedtime.     simvastatin 20 MG tablet  Commonly known as:  ZOCOR  Take 10 mg by mouth at bedtime.     torsemide 20 MG tablet  Commonly known as:  DEMADEX  Take 2 tablets (40 mg total) by mouth daily.         Disposition and Follow-up:  Will be discharged home today in stable and improved condition. Has an appointment shceuled with the heart failure clinic for next week.  Consults:  Cardiology, Dr. Gala Romney   Significant  Diagnostic Studies:  No results found.  Brief H and P: For complete details please refer to admission H and P, but in brief patient is a 79 year old African American male with a past medical history of diabetes, hypertension, prior history of gastric lymphoma status post gastrectomy, severe osteoarthritis predominantly affecting his right knee , prior history of back surgery and resultant right foot drop comes to the ED for evaluation of the above-noted complaints. The patient a few weeks ago he started noticing that he was having bilateral leg edema, but slowly worsened and progressed proximally. A few days ago he claimed that he started having abdominal distention. He denies any exertional dyspnea, he chronically has 3 pillow orthopnea (for reflux), denies PND. He denies any chest pain.  He was evaluated in the emergency room, found to have anasarca, we were subsequently asked to admit this patient for further evaluation and treatment.     Hospital Course:  Active Problems:   Diabetes mellitus   Gout   CKD (chronic kidney disease) stage 3, GFR 30-59 ml/min   Osteoarthritis   CHF (congestive heart failure)   Right heart failure   Cardiorenal syndrome with renal failure   Acute on chronic renal failure    CHF (congestive heart failure) exacerbation, right sided heart failure  -Anasarca due to right sided congestive heart failure  - 2-D echocardiogram -shows slightly decreased Systolic function with EF around 45 percent  - Continue beta blocker  - given stage III CKD and the need for diureses-will not start ACE inhibitor-on BiDIL  - Patient had right sided heart cath which revealed elevated right sided pressures consistent with RV failure.  Patient has continued signs of volume overload and needs continued diuresis.  -The heart failure team has followed him this admission. -he required a milrinone drip. -Will follow up with them next week in clinic.  Acute on CKD (chronic kidney  disease) stage 3, GFR 30-59 ml/min  - baseline creatinine 1.6 - 1.7  - creatinine down to 2.22 on DC. -Follow closely OP.   Normocytic Anemia  -Likely secondary to chronic kidney disease. No obvious signs of bleeding.   Uncontrolled HTN  -better control  -continue Coreg and BiDil.   Diabetes mellitus  - Well controlled.  Gout  -  Stable   Osteoarthritis  - Patient claims that he has severe osteoarthritis of his right knee-he sees a orthopedic doctor at [facility] system-he has been told that he is not an operative candidate for right knee replacement-as he has severe arthritis and a foot drop on the same side  - His right knee on exam-does not appear to have any tenderness or erythema to suggest an infective process  -X Ray right knee-show chronic changes and unchanged/stable moderate effusion      Time spent on Discharge: Greater than 30 minutes.  SignedChaya Jan [facility] Pager: [phone] 11/25/2012, 4:41 PM

## 2012-11-25 NOTE — Progress Notes (Signed)
Physical Therapy Treatment Patient Details Name: [name] MRN: [id number] DOB: 1931-09-27 Today's Date: 11/25/2012 Time: 4098-1191 PT Time Calculation (min): 11 min  PT Assessment / Plan / Recommendation Comments on Treatment Session  Quick session in prep for d/c today. Education provided on safety with transfers and especially the safe use of RW. Patient very excited about going home today. I observed him in static standing with good balance as he put his shirt on. He also ambulated to and from the bathroom and was able to demonstrate sit<>stand transfers without assist today.     Follow Up Recommendations  Home health PT;Supervision - Intermittent     Does the patient have the potential to tolerate intense rehabilitation     Barriers to Discharge        Equipment Recommendations  None recommended by PT    Recommendations for Other Services    Frequency Min 3X/week   Plan Discharge plan remains appropriate;Frequency remains appropriate    Precautions / Restrictions Precautions Precautions: Fall Precaution Comments: right knee OA and foot drop Required Braces or Orthoses: Other Brace/Splint Other Brace/Splint: right knee velcro neoprene in night stand drawer Restrictions Weight Bearing Restrictions: No   Pertinent Vitals/Pain Denies pain    Mobility  Bed Mobility Bed Mobility: Not assessed Transfers Transfers: Sit to Stand;Stand to Sit Sit to Stand: 5: Supervision Stand to Sit: 6: Modified independent (Device/Increase time) Details for Transfer Assistance: verbal cues for safe placement of RW in prep for standing so that he won't have to reach too far out of his BOS, pt verbalized understanding Ambulation/Gait Ambulation/Gait Assistance: 6: Modified independent (Device/Increase time) Ambulation Distance (Feet): 20 Feet Assistive device: Rolling walker Ambulation/Gait Assistance Details: verbal cues for safety with RW when pulling up to the sink to wash his  hands Gait Pattern: Step-through pattern General Gait Details: drop foot, significant genu varus of right knee with instability, pt wearing knee brace      PT Goals Acute Rehab PT Goals PT Goal: Sit to Stand - Progress: Progressing toward goal PT Goal: Stand to Sit - Progress: Met PT Transfer Goal: Bed to Chair/Chair to Bed - Progress: Progressing toward goal PT Goal: Ambulate - Progress: Progressing toward goal  Visit Information  Last PT Received On: 11/25/12 Assistance Needed: +1    Subjective Data  Subjective: Im getting ready to go home. Patient Stated Goal: home   Cognition  Cognition Overall Cognitive Status: Appears within functional limits for tasks assessed/performed Arousal/Alertness: Awake/alert Orientation Level: Appears intact for tasks assessed Behavior During Session: [facility] for tasks performed    Balance  Static Standing Balance Static Standing - Balance Support: No upper extremity supported Static Standing - Level of Assistance: 7: Independent  End of Session PT - End of Session Equipment Utilized During Treatment: Gait belt Activity Tolerance: Patient tolerated treatment well Patient left: in chair;with call bell/phone within reach Nurse Communication: Mobility status   GP     [facility] HELEN 11/25/2012, 12:33 PM

## 2012-11-28 ENCOUNTER — Encounter (HOSPITAL_COMMUNITY): Payer: Self-pay | Admitting: Nurse Practitioner

## 2012-11-28 ENCOUNTER — Emergency Department (HOSPITAL_COMMUNITY)
Admission: EM | Admit: 2012-11-28 | Discharge: 2012-11-28 | Disposition: A | Discharge status: Home / Self Care | Payer: Medicare Other | Attending: Emergency Medicine | Admitting: Emergency Medicine

## 2012-11-28 DIAGNOSIS — E7800 Pure hypercholesterolemia, unspecified: Secondary | ICD-10-CM | POA: Insufficient documentation

## 2012-11-28 DIAGNOSIS — E119 Type 2 diabetes mellitus without complications: Secondary | ICD-10-CM | POA: Insufficient documentation

## 2012-11-28 DIAGNOSIS — Z8639 Personal history of other endocrine, nutritional and metabolic disease: Secondary | ICD-10-CM | POA: Insufficient documentation

## 2012-11-28 DIAGNOSIS — R42 Dizziness and giddiness: Secondary | ICD-10-CM | POA: Insufficient documentation

## 2012-11-28 DIAGNOSIS — Z87898 Personal history of other specified conditions: Secondary | ICD-10-CM | POA: Insufficient documentation

## 2012-11-28 DIAGNOSIS — R55 Syncope and collapse: Secondary | ICD-10-CM | POA: Insufficient documentation

## 2012-11-28 DIAGNOSIS — Z87442 Personal history of urinary calculi: Secondary | ICD-10-CM | POA: Insufficient documentation

## 2012-11-28 DIAGNOSIS — I10 Essential (primary) hypertension: Secondary | ICD-10-CM | POA: Insufficient documentation

## 2012-11-28 DIAGNOSIS — Z79899 Other long term (current) drug therapy: Secondary | ICD-10-CM | POA: Insufficient documentation

## 2012-11-28 DIAGNOSIS — Z862 Personal history of diseases of the blood and blood-forming organs and certain disorders involving the immune mechanism: Secondary | ICD-10-CM | POA: Insufficient documentation

## 2012-11-28 DIAGNOSIS — Z8701 Personal history of pneumonia (recurrent): Secondary | ICD-10-CM | POA: Insufficient documentation

## 2012-11-28 DIAGNOSIS — I509 Heart failure, unspecified: Secondary | ICD-10-CM | POA: Insufficient documentation

## 2012-11-28 DIAGNOSIS — Z7982 Long term (current) use of aspirin: Secondary | ICD-10-CM | POA: Insufficient documentation

## 2012-11-28 LAB — CBC
Hemoglobin: 11.5 g/dL — ABNORMAL LOW (ref 13.0–17.0)
MCHC: 33.3 g/dL (ref 30.0–36.0)
Platelets: 301 10*3/uL (ref 150–400)

## 2012-11-28 LAB — PRO B NATRIURETIC PEPTIDE: Pro B Natriuretic peptide (BNP): 1456 pg/mL — ABNORMAL HIGH (ref 0–450)

## 2012-11-28 LAB — BASIC METABOLIC PANEL
GFR calc Af Amer: 31 mL/min — ABNORMAL LOW (ref >90)
GFR calc non Af Amer: 27 mL/min — ABNORMAL LOW (ref >90)
Glucose, Bld: 161 mg/dL — ABNORMAL HIGH (ref 70–99)
Potassium: 4.1 mEq/L (ref 3.5–5.1)
Sodium: 131 mEq/L — ABNORMAL LOW (ref 135–145)

## 2012-11-28 LAB — POCT I-STAT TROPONIN I: Troponin i, poc: 0.03 ng/mL (ref 0.00–0.08)

## 2012-11-28 LAB — GLUCOSE, CAPILLARY: Glucose-Capillary: 181 mg/dL — ABNORMAL HIGH (ref 70–99)

## 2012-11-28 NOTE — ED Notes (Signed)
Pt stands and took steps. Denies dizziness or lightheaded.

## 2012-11-28 NOTE — ED Provider Notes (Signed)
History     CSN: [id number]  Arrival date & time 11/28/12  1439   First MD Initiated Contact with Patient 11/28/12 1632      Chief Complaint  Patient presents with  . Near Syncope    The history is provided by the patient.   patient reports he was recently released from the hospital after admission for congestive heart failure and significant diuresis with 30 pound weight loss.  The patient was at home today in his normal state of health.  He got up to himself a sandwich.  He became slightly lightheaded and bent over and rested against the table.  He never had syncope.  He denies chest pain shortness of breath.  No palpitations.  He's never had symptoms like this before.  After 5 or 10 minutes and resolved and he came in emergency apartment for evaluation.  On arrival to emerge from he states he feels good.  He can stand up at bedside without any difficulty.  He denies fevers and chills.  No diarrhea no chest pain shortness breath or palpitations at this time.  No other complaints.  He feels fine at this time.  He denies melena and hematochezia.  No diarrhea.  Past Medical History  Diagnosis Date  . Pneumonia   . Lymphoma     s/p partial gastrectomy  . Hypertension   . Hypercholesteremia   . Diabetes mellitus   . Gout   . Irregular heart beat   . Kidney stone     Past Surgical History  Procedure Laterality Date  . Partial gastrectomy  1994    for lymphoma  . Hernia repair    . Joint replacement    . Back surgery    . Esophagogastroduodenoscopy N/A 10/28/2012    Procedure: ESOPHAGOGASTRODUODENOSCOPY (EGD);  Surgeon: Shirley Friar, MD;  Location: [facility];  Service: Endoscopy;  Laterality: N/A;    History reviewed. No pertinent family history.  History  Substance Use Topics  . Smoking status: Never Smoker   . Smokeless tobacco: Never Used  . Alcohol Use: No      Review of Systems  All other systems reviewed and are negative.    Allergies  Review of  patient's allergies indicates no known allergies.  Home Medications   Current Outpatient Rx  Name  Route  Sig  Dispense  Refill  . acetaminophen (TYLENOL) 325 MG tablet   Oral   Take 325 mg by mouth every 6 (six) hours as needed. For pain         . albuterol (PROVENTIL HFA;VENTOLIN HFA) 108 (90 BASE) MCG/ACT inhaler   Inhalation   Inhale 2 puffs into the lungs every 6 (six) hours as needed for wheezing.         Marland Kitchen amLODipine (NORVASC) 5 MG tablet   Oral   Take 1 tablet (5 mg total) by mouth daily.   30 tablet   1   . Artificial Tear Ointment OINT   Ophthalmic   Apply 1 application to eye at bedtime.          Marland Kitchen aspirin EC 81 MG tablet   Oral   Take 81 mg by mouth daily.         . bisacodyl (DULCOLAX) 5 MG EC tablet   Oral   Take 5 mg by mouth daily as needed. For constipation         . carvedilol (COREG) 12.5 MG tablet   Oral   Take 12.5 mg by  mouth 2 (two) times daily with a meal.          . Cyanocobalamin (VITAMIN B-12 PO)   Oral   Take 1 tablet by mouth every other day.         . cyclobenzaprine (FLEXERIL) 10 MG tablet   Oral   Take 10 mg by mouth 3 (three) times daily as needed. For muscle spasms         . cycloSPORINE (RESTASIS) 0.05 % ophthalmic emulsion   Both Eyes   Place 1 drop into both eyes 2 (two) times daily.         Marland Kitchen gabapentin (NEURONTIN) 100 MG capsule   Oral   Take 100 mg by mouth 2 (two) times daily.         Marland Kitchen glipiZIDE (GLUCOTROL) 10 MG tablet   Oral   Take 10 mg by mouth daily.           Marland Kitchen HYDROcodone-acetaminophen (NORCO/VICODIN) 5-325 MG per tablet   Oral   Take 1 tablet by mouth every 4 (four) hours as needed for pain.         . hydroxypropyl methylcellulose (ISOPTO TEARS) 2.5 % ophthalmic solution   Both Eyes   Place 1 drop into both eyes 3 (three) times daily as needed (dry eyes).          . isosorbide-hydrALAZINE (BIDIL) 20-37.5 MG per tablet   Oral   Take 1 tablet by mouth every 8 (eight) hours.    90 tablet   1   . lidocaine (LMX) 4 % cream   Topical   Apply 1 application topically as needed (via atomizer).         . niacin 500 MG tablet   Oral   Take 1,000 mg by mouth at bedtime.          Marland Kitchen omeprazole (PRILOSEC) 20 MG capsule   Oral   Take 20 mg by mouth 2 (two) times daily.         . polyethylene glycol (MIRALAX / GLYCOLAX) packet   Oral   Take 17 g by mouth daily as needed. For constipation         . senna (SENOKOT) 8.6 MG tablet   Oral   Take 2 tablets by mouth 2 (two) times daily.          . sertraline (ZOLOFT) 100 MG tablet   Oral   Take 200 mg by mouth at bedtime.          . simvastatin (ZOCOR) 20 MG tablet   Oral   Take 10 mg by mouth at bedtime.         . torsemide (DEMADEX) 20 MG tablet   Oral   Take 2 tablets (40 mg total) by mouth daily.   60 tablet   1   . ergocalciferol (VITAMIN D2) 50000 UNITS capsule   Oral   Take 50,000 Units by mouth every 14 (fourteen) days.           BP 158/95  Pulse 64  Temp(Src) 97.6 F (36.4 C) (Oral)  Resp 12  SpO2 99%  Physical Exam  Nursing note and vitals reviewed. Constitutional: He is oriented to person, place, and time. He appears well-developed and well-nourished.  HENT:  Head: Normocephalic and atraumatic.  Eyes: EOM are normal.  Neck: Normal range of motion.  Cardiovascular: Normal rate, regular rhythm, normal heart sounds and intact distal pulses.   Pulmonary/Chest: Effort normal and breath sounds normal. No respiratory  distress.  Abdominal: Soft. He exhibits no distension. There is no tenderness.  Musculoskeletal: Normal range of motion.  Neurological: He is alert and oriented to person, place, and time.  Skin: Skin is warm and dry.  Psychiatric: He has a normal mood and affect. Judgment normal.    ED Course  Procedures (including critical care time)   Date: 11/28/2012  Rate: 88  Rhythm: normal sinus rhythm with first degree AV block  QRS Axis: normal  Intervals:  normal  ST/T Wave abnormalities: normal  Conduction Disutrbances: none  Narrative Interpretation:   Old EKG Reviewed: No significant changes noted     Labs Reviewed  CBC - Abnormal; Notable for the following:    RBC 3.92 (*)    Hemoglobin 11.5 (*)    HCT 34.5 (*)    All other components within normal limits  BASIC METABOLIC PANEL - Abnormal; Notable for the following:    Sodium 131 (*)    Chloride 90 (*)    Glucose, Bld 161 (*)    BUN 47 (*)    Creatinine, Ser 2.19 (*)    GFR calc non Af Amer 27 (*)    GFR calc Af Amer 31 (*)    All other components within normal limits  PRO B NATRIURETIC PEPTIDE - Abnormal; Notable for the following:    Pro B Natriuretic peptide (BNP) 1456.0 (*)    All other components within normal limits  GLUCOSE, CAPILLARY - Abnormal; Notable for the following:    Glucose-Capillary 181 (*)    All other components within normal limits  POCT I-STAT TROPONIN I   No results found.   1. Near syncope       MDM  No complaints at this time.  Labs without significant abnormality from baseline.  BNP seems to be improving.  Weight loss is down.  Patient continues diuresis at home.  His blood pressure is 137/78 in emergency apartment.  Discharge home with PCP and cardiology followup.  Doubt arrhythmia.        Lyanne Co, MD 11/28/12 [phone]

## 2012-11-28 NOTE — ED Notes (Signed)
Pt states he was in kitchen making a sandwich today and when he bent over he became very dizzy and felt he might pass out. States he leaned against the table and rested until symptoms resolved. Denies any injuries. States his blood sugar has been elevated also. Pt reports he was dischargde from [place] Thursday, was admitted for CHF.

## 2012-11-30 ENCOUNTER — Ambulatory Visit (HOSPITAL_COMMUNITY)
Admit: 2012-11-30 | Discharge: 2012-11-30 | Disposition: A | Discharge status: Home / Self Care | Payer: Medicare Other | Attending: Internal Medicine | Admitting: Internal Medicine

## 2012-11-30 ENCOUNTER — Encounter (HOSPITAL_COMMUNITY): Payer: Self-pay

## 2012-11-30 VITALS — BP 142/76 | HR 80 | Wt 235.0 lb

## 2012-11-30 DIAGNOSIS — I509 Heart failure, unspecified: Secondary | ICD-10-CM | POA: Insufficient documentation

## 2012-11-30 DIAGNOSIS — I50810 Right heart failure, unspecified: Secondary | ICD-10-CM

## 2012-11-30 NOTE — Progress Notes (Addendum)
Patient ID: [name], male   DOB: February 22, 1932, 79 y.o.   MRN: [id number]  Weight Range  243 pounds  Baseline proBNP   PCP: Dr Petra Kuba Actively Followed at [facility]: Dr Sunny Schlein HPI: 79 yo with history of HTN, CKD, and DM admitted with CHF, primarily right-sided. (LVEF 45-50% with mild RV dysfunction)   [facility]HC: 11/16/12  RA mean 19  RV 53/16  PA 56/23, mean 38  PCWP mean 21  Oxygen saturations:  PA 53%  AO 94%  Cardiac Output (Fick) 5.05  Cardiac Index (Fick) 2.36  PVR 3.36 WU   Admitted to [facility] with volume overload. He required Lasix drip and Milrinone. Renal Ultrasound negative for hydronephrosis. Large bilateral renal cysts with normal kidney size. Discharge weight- 243 pounds. Creatinine 2.22   He returns for post hospital follow up. Denies SOB/PND/Orthopnea. Weight at home 230 pounds. He is actively followed at the [facility] hospital in [place] and he was seen yesterday. Labs drawn at [facility]. He will follow up at [facility] 12/08/12 for a wheelchair. His granddaughter does grocery shopping. Complaint with medications. [facility] following.     ROS: All systems negative except as listed in HPI, PMH and Problem List.  Past Medical History  Diagnosis Date  . Pneumonia   . Lymphoma     s/p partial gastrectomy  . Hypertension   . Hypercholesteremia   . Diabetes mellitus   . Gout   . Irregular heart beat   . Kidney stone     Current Outpatient Prescriptions  Medication Sig Dispense Refill  . acetaminophen (TYLENOL) 325 MG tablet Take 325 mg by mouth every 6 (six) hours as needed. For pain      . albuterol (PROVENTIL HFA;VENTOLIN HFA) 108 (90 BASE) MCG/ACT inhaler Inhale 2 puffs into the lungs every 6 (six) hours as needed for wheezing.      Marland Kitchen amLODipine (NORVASC) 5 MG tablet Take 1 tablet (5 mg total) by mouth daily.  30 tablet  1  . Artificial Tear Ointment OINT Apply 1 application to eye at bedtime.       Marland Kitchen aspirin EC 81 MG tablet Take 81 mg by mouth daily.      . bisacodyl  (DULCOLAX) 5 MG EC tablet Take 5 mg by mouth daily as needed. For constipation      . carvedilol (COREG) 12.5 MG tablet Take 12.5 mg by mouth 2 (two) times daily with a meal.       . cyclobenzaprine (FLEXERIL) 10 MG tablet Take 10 mg by mouth 3 (three) times daily as needed. For muscle spasms      . cycloSPORINE (RESTASIS) 0.05 % ophthalmic emulsion Place 1 drop into both eyes 2 (two) times daily.      . ergocalciferol (VITAMIN D2) 50000 UNITS capsule Take 50,000 Units by mouth every 14 (fourteen) days.      Marland Kitchen gabapentin (NEURONTIN) 100 MG capsule Take 100 mg by mouth 2 (two) times daily.      Marland Kitchen glipiZIDE (GLUCOTROL) 10 MG tablet Take 10 mg by mouth daily.        Marland Kitchen HYDROcodone-acetaminophen (NORCO/VICODIN) 5-325 MG per tablet Take 1 tablet by mouth every 4 (four) hours as needed for pain.      . hydroxypropyl methylcellulose (ISOPTO TEARS) 2.5 % ophthalmic solution Place 1 drop into both eyes 3 (three) times daily as needed (dry eyes).       . isosorbide-hydrALAZINE (BIDIL) 20-37.5 MG per tablet Take 1 tablet by mouth every 8 (  eight) hours.  90 tablet  1  . lidocaine (LMX) 4 % cream Apply 1 application topically as needed (via atomizer).      . niacin 500 MG tablet Take 1,000 mg by mouth at bedtime.       Marland Kitchen omeprazole (PRILOSEC) 20 MG capsule Take 20 mg by mouth 2 (two) times daily.      . polyethylene glycol (MIRALAX / GLYCOLAX) packet Take 17 g by mouth daily as needed. For constipation      . senna (SENOKOT) 8.6 MG tablet Take 2 tablets by mouth 2 (two) times daily.       . sertraline (ZOLOFT) 100 MG tablet Take 200 mg by mouth at bedtime.       . simvastatin (ZOCOR) 20 MG tablet Take 10 mg by mouth at bedtime.      . torsemide (DEMADEX) 20 MG tablet Take 2 tablets (40 mg total) by mouth daily.  60 tablet  1  . Cyanocobalamin (VITAMIN B-12 PO) Take 1 tablet by mouth every other day.       No current facility-administered medications for this encounter.     PHYSICAL EXAM: Filed Vitals:    11/30/12 1558  BP: 142/76  Pulse: 80  Weight: 235 lb (106.595 kg)  SpO2: 98%    PHYSICAL EXAM  General: Elderly. NAD in wheelchair Neck: JVP 5-6 cm, no thyromegaly or thyroid nodule.  Lungs: Decreased in the bases  CV: Nondisplaced PMI. Heart regular S1/S2, increased P2, no S3/S4, 2/6 systolic murmur LLSB. No carotid bruit.  Abdomen: Nontender. Hernia noted. Distended.  Neurologic: Alert and oriented x 3.  Psych: Normal affect.  Extremities: No clubbing or cyanosis. No lower extremity edema.       ASSESSMENT & PLAN:

## 2012-11-30 NOTE — Assessment & Plan Note (Signed)
Volume status stable. Continue current diuretic regimen. He will contact [facility] to fax lab results. Continue [facility]. Follow up in 1 month to reassess volume.

## 2012-11-30 NOTE — Patient Instructions (Addendum)
Follow up in 1 month   Do the following things EVERYDAY: 1) Weigh yourself in the morning before breakfast. Write it down and keep it in a log. 2) Take your medicines as prescribed 3) Eat low salt foods-Limit salt (sodium) to 2000 mg per day.  4) Stay as active as you can everyday 5) Limit all fluids for the day to less than 2 liters 

## 2012-12-23 ENCOUNTER — Encounter (HOSPITAL_COMMUNITY): Payer: Self-pay | Admitting: Emergency Medicine

## 2012-12-23 ENCOUNTER — Emergency Department (HOSPITAL_COMMUNITY)
Admission: EM | Admit: 2012-12-23 | Discharge: 2012-12-23 | Disposition: A | Discharge status: Home / Self Care | Payer: Medicare Other | Attending: Emergency Medicine | Admitting: Emergency Medicine

## 2012-12-23 DIAGNOSIS — Z862 Personal history of diseases of the blood and blood-forming organs and certain disorders involving the immune mechanism: Secondary | ICD-10-CM | POA: Insufficient documentation

## 2012-12-23 DIAGNOSIS — Z79899 Other long term (current) drug therapy: Secondary | ICD-10-CM | POA: Insufficient documentation

## 2012-12-23 DIAGNOSIS — Z7982 Long term (current) use of aspirin: Secondary | ICD-10-CM | POA: Insufficient documentation

## 2012-12-23 DIAGNOSIS — E119 Type 2 diabetes mellitus without complications: Secondary | ICD-10-CM | POA: Insufficient documentation

## 2012-12-23 DIAGNOSIS — L739 Follicular disorder, unspecified: Secondary | ICD-10-CM

## 2012-12-23 DIAGNOSIS — Z8701 Personal history of pneumonia (recurrent): Secondary | ICD-10-CM | POA: Insufficient documentation

## 2012-12-23 DIAGNOSIS — E7800 Pure hypercholesterolemia, unspecified: Secondary | ICD-10-CM | POA: Insufficient documentation

## 2012-12-23 DIAGNOSIS — Z87442 Personal history of urinary calculi: Secondary | ICD-10-CM | POA: Insufficient documentation

## 2012-12-23 DIAGNOSIS — Z8679 Personal history of other diseases of the circulatory system: Secondary | ICD-10-CM | POA: Insufficient documentation

## 2012-12-23 DIAGNOSIS — L738 Other specified follicular disorders: Secondary | ICD-10-CM | POA: Insufficient documentation

## 2012-12-23 DIAGNOSIS — Z8639 Personal history of other endocrine, nutritional and metabolic disease: Secondary | ICD-10-CM | POA: Insufficient documentation

## 2012-12-23 DIAGNOSIS — I10 Essential (primary) hypertension: Secondary | ICD-10-CM | POA: Insufficient documentation

## 2012-12-23 DIAGNOSIS — Z87898 Personal history of other specified conditions: Secondary | ICD-10-CM | POA: Insufficient documentation

## 2012-12-23 LAB — GLUCOSE, CAPILLARY: Glucose-Capillary: 66 mg/dL — ABNORMAL LOW (ref 70–99)

## 2012-12-23 MED ORDER — DOXYCYCLINE HYCLATE 100 MG PO CAPS: 100.0000 mg | ORAL_CAPSULE | Freq: Two times a day (BID) | ORAL | Status: DC

## 2012-12-23 NOTE — ED Notes (Signed)
Pt provided grahm crackers and coffee per ED PA request following CBG result

## 2012-12-23 NOTE — ED Notes (Signed)
PT. REPORTS RASHES AT CHEST AREA FOR 4 DAYS , DENIES PAIN OR ITCHING .

## 2012-12-23 NOTE — ED Provider Notes (Signed)
History    This chart was scribed for non-physician practitioner working with Carleene Cooper III, MD by Donne Anon, ED Scribe. This patient was seen in room [facility] and the patient's care was started at 2153.   CSN: [id number]  Arrival date & time 12/23/12  7829   First MD Initiated Contact with Patient 12/23/12 2153      Chief Complaint  Patient presents with  . Rash     The history is provided by the patient. No language interpreter was used.   [name] is a 79 y.o. male who presents to the Emergency Department complaining of gradual onset rash on his chest described as small red bumps and began 4 days ago. He denies itching or pain with the rash. He states he has had this 4 times before and states he thinks this is shingles. Does admit that the last three times he had shingles it hurt and this rash does not hurt.  He denies fever, chills, cough, sore throat or any other pain. He states no one he lives with has the rash.   Past Medical History  Diagnosis Date  . Pneumonia   . Lymphoma     s/p partial gastrectomy  . Hypertension   . Hypercholesteremia   . Diabetes mellitus   . Gout   . Irregular heart beat   . Kidney stone     Past Surgical History  Procedure Laterality Date  . Partial gastrectomy  1994    for lymphoma  . Hernia repair    . Joint replacement    . Back surgery    . Esophagogastroduodenoscopy N/A 10/28/2012    Procedure: ESOPHAGOGASTRODUODENOSCOPY (EGD);  Surgeon: Shirley Friar, MD;  Location: [facility];  Service: Endoscopy;  Laterality: N/A;    No family history on file.  History  Substance Use Topics  . Smoking status: Never Smoker   . Smokeless tobacco: Never Used  . Alcohol Use: No      Review of Systems  Constitutional: Negative for fever and chills.  Respiratory: Negative for cough and shortness of breath.   Cardiovascular: Negative for chest pain.  Skin: Positive for rash.  All other systems reviewed and are  negative.    Allergies  Review of patient's allergies indicates no known allergies.  Home Medications   Current Outpatient Rx  Name  Route  Sig  Dispense  Refill  . acetaminophen (TYLENOL) 325 MG tablet   Oral   Take 325 mg by mouth every 6 (six) hours as needed for pain.          Marland Kitchen albuterol (PROVENTIL HFA;VENTOLIN HFA) 108 (90 BASE) MCG/ACT inhaler   Inhalation   Inhale 2 puffs into the lungs every 6 (six) hours as needed for wheezing or shortness of breath.          Marland Kitchen amLODipine (NORVASC) 5 MG tablet   Oral   Take 1 tablet (5 mg total) by mouth daily.   30 tablet   1   . Artificial Tear Ointment OINT   Ophthalmic   Apply 1 application to eye at bedtime.          Marland Kitchen aspirin EC 81 MG tablet   Oral   Take 81 mg by mouth daily.         . bisacodyl (DULCOLAX) 5 MG EC tablet   Oral   Take 5 mg by mouth daily as needed for constipation.          . carvedilol (  COREG) 12.5 MG tablet   Oral   Take 12.5 mg by mouth 2 (two) times daily with a meal.          . Cyanocobalamin (VITAMIN B-12 PO)   Oral   Take 1 tablet by mouth every other day.         . cyclobenzaprine (FLEXERIL) 10 MG tablet   Oral   Take 10 mg by mouth 3 (three) times daily as needed for muscle spasms.          . cycloSPORINE (RESTASIS) 0.05 % ophthalmic emulsion   Both Eyes   Place 1 drop into both eyes 2 (two) times daily.         Marland Kitchen gabapentin (NEURONTIN) 100 MG capsule   Oral   Take 100 mg by mouth 2 (two) times daily.         Marland Kitchen glipiZIDE (GLUCOTROL) 10 MG tablet   Oral   Take 10 mg by mouth daily.           Marland Kitchen HYDROcodone-acetaminophen (NORCO/VICODIN) 5-325 MG per tablet   Oral   Take 1 tablet by mouth every 4 (four) hours as needed for pain.         . hydroxypropyl methylcellulose (ISOPTO TEARS) 2.5 % ophthalmic solution   Both Eyes   Place 1 drop into both eyes 3 (three) times daily as needed (dry eyes).          . isosorbide-hydrALAZINE (BIDIL) 20-37.5 MG per  tablet   Oral   Take 1 tablet by mouth every 8 (eight) hours.   90 tablet   1   . lidocaine (LMX) 4 % cream   Topical   Apply 1 application topically as needed (via atomizer).         . niacin 500 MG tablet   Oral   Take 1,000 mg by mouth at bedtime.          Marland Kitchen omeprazole (PRILOSEC) 20 MG capsule   Oral   Take 20 mg by mouth 2 (two) times daily as needed (acid reflux).          . polyethylene glycol (MIRALAX / GLYCOLAX) packet   Oral   Take 17 g by mouth daily as needed (constipation). For constipation         . senna (SENOKOT) 8.6 MG tablet   Oral   Take 2 tablets by mouth 2 (two) times daily.          . sertraline (ZOLOFT) 100 MG tablet   Oral   Take 200 mg by mouth at bedtime.          . simvastatin (ZOCOR) 20 MG tablet   Oral   Take 10 mg by mouth at bedtime.         . torsemide (DEMADEX) 20 MG tablet   Oral   Take 2 tablets (40 mg total) by mouth daily.   60 tablet   1     BP 113/58  Pulse 69  Temp(Src) 98.1 F (36.7 C) (Oral)  Resp 14  SpO2 100%  Physical Exam  Nursing note and vitals reviewed. Constitutional: He appears well-developed and well-nourished. No distress.  HENT:  Head: Normocephalic and atraumatic.  Neck: Neck supple.  Pulmonary/Chest: Effort normal.  Neurological: He is alert.  Skin: He is not diaphoretic.  A few scattered pustules on upper back but no other skin changes noted. Scattered papular rash on bilateral chest wall.  Nontender.  No discharge from lesions.  No surrounding  erythema, edema, warmth.      ED Course  Procedures (including critical care time) DIAGNOSTIC STUDIES: Oxygen Saturation is 100% on room air, normal by my interpretation.    COORDINATION OF CARE: 10:02 PM Discussed treatment plan which includes consulting with Dr. Ignacia Palma with pt at bedside and pt agreed to plan.   10:14 PM Consulted with Dr. Ignacia Palma who advised the rash is folliculitis and to prescribed Doxycycline for it.   Labs  Reviewed  GLUCOSE, CAPILLARY - Abnormal; Notable for the following:    Glucose-Capillary 66 (*)    All other components within normal limits   No results found.   1. Folliculitis     MDM  Pt with sparse rash over bilateral chest c/w folliculitis.  Pt is afebrile, nontoxic.  Denies systemic symptoms.  No e/o cellulitis.  This rash is not consistent with shingles.  Pt also seen by Dr Ignacia Palma who diagnosed folliculitis and recommended doxycycline.  Pt d/c home with PCP follow up.  Return precautions given.     I personally performed the services described in this documentation, which was scribed in my presence. The recorded information has been reviewed and is accurate.         Morgan, PA-C 12/24/12 0006

## 2012-12-24 NOTE — ED Provider Notes (Signed)
Medical screening examination/treatment/procedure(s) were conducted as a shared visit with non-physician practitioner(s) and myself.  I personally evaluated the patient during the encounter Elderly man with fine vesicular rash scattered sparsely over the anterior chest bilaterally.  Looks like a folliculitis to me.  Carleene Cooper III, MD 12/24/12 [phone]

## 2012-12-30 ENCOUNTER — Ambulatory Visit (HOSPITAL_COMMUNITY)
Admission: RE | Admit: 2012-12-30 | Discharge: 2012-12-30 | Disposition: A | Discharge status: Home / Self Care | Payer: Medicare Other | Source: Ambulatory Visit | Attending: Internal Medicine | Admitting: Internal Medicine

## 2012-12-30 VITALS — BP 124/66 | HR 66 | Wt 224.2 lb

## 2012-12-30 DIAGNOSIS — I10 Essential (primary) hypertension: Secondary | ICD-10-CM | POA: Insufficient documentation

## 2012-12-30 DIAGNOSIS — I509 Heart failure, unspecified: Secondary | ICD-10-CM | POA: Insufficient documentation

## 2012-12-30 DIAGNOSIS — I50810 Right heart failure, unspecified: Secondary | ICD-10-CM

## 2012-12-30 DIAGNOSIS — Q619 Cystic kidney disease, unspecified: Secondary | ICD-10-CM | POA: Insufficient documentation

## 2012-12-30 DIAGNOSIS — E7800 Pure hypercholesterolemia, unspecified: Secondary | ICD-10-CM | POA: Insufficient documentation

## 2012-12-30 DIAGNOSIS — E119 Type 2 diabetes mellitus without complications: Secondary | ICD-10-CM | POA: Insufficient documentation

## 2012-12-30 NOTE — Addendum Note (Signed)
Encounter addended by: Noralee Space, RN on: 12/30/2012  4:20 PM<BR>     Documentation filed: Patient Instructions Section

## 2012-12-30 NOTE — Assessment & Plan Note (Signed)
Doing well. Volume status improved. I offered to check labs but he refused. Will continue current regimen. Reinforced need for daily weights and reviewed use of sliding scale diuretics. See back in 4 months.

## 2012-12-30 NOTE — Assessment & Plan Note (Signed)
Blood pressure well controlled. Continue current regimen.  

## 2012-12-30 NOTE — Patient Instructions (Addendum)
We will contact you in 4 months to schedule your next appointment.  

## 2012-12-30 NOTE — Progress Notes (Signed)
Patient ID: [name], male   DOB: 1931-09-30, 79 y.o.   MRN: [id number]  Weight Range  243 pounds  Baseline proBNP   PCP: Dr Petra Kuba Actively Followed at [facility]: Dr Sunny Schlein  HPI: 79 yo with history of HTN, CKD, and DM, CHF mostly right-sided. (LVEF 45-50% with mild RV dysfunction)   RHC: 11/16/12  RA mean 19  RV 53/16  PA 56/23, mean 38  PCWP mean 21  Oxygen saturations:  PA 53%  AO 94%  Cardiac Output (Fick) 5.05  Cardiac Index (Fick) 2.36  PVR 3.36 WU   Admitted to [facility] in 3/14 with volume overload. He required Lasix drip and Milrinone. Renal Ultrasound negative for hydronephrosis. Large bilateral renal cysts with normal kidney size. Discharge weight- 243 pounds. Creatinine 2.22   He returns for follow up. Says he is doing great. Doing yard work.  Denies SOB/PND/Orthopnea/edema. Weighs himself every day. Weight continues to decrease. Weight at home 220 pounds. He is actively followed at the [facility] in [place] with PCP, cardiology and PCP. Saw them recently and said he was doing well.  Complaint with medications. [facility] following.     ROS: All systems negative except as listed in HPI, PMH and Problem List.  Past Medical History  Diagnosis Date  . Pneumonia   . Lymphoma     s/p partial gastrectomy  . Hypertension   . Hypercholesteremia   . Diabetes mellitus   . Gout   . Irregular heart beat   . Kidney stone     Current Outpatient Prescriptions  Medication Sig Dispense Refill  . acetaminophen (TYLENOL) 325 MG tablet Take 325 mg by mouth every 6 (six) hours as needed for pain.       Marland Kitchen albuterol (PROVENTIL HFA;VENTOLIN HFA) 108 (90 BASE) MCG/ACT inhaler Inhale 2 puffs into the lungs every 6 (six) hours as needed for wheezing or shortness of breath.       Marland Kitchen amLODipine (NORVASC) 5 MG tablet Take 1 tablet (5 mg total) by mouth daily.  30 tablet  1  . Artificial Tear Ointment OINT Apply 1 application to eye at bedtime.       Marland Kitchen aspirin EC 81 MG tablet Take 81 mg by  mouth daily.      . bisacodyl (DULCOLAX) 5 MG EC tablet Take 5 mg by mouth daily as needed for constipation.       . carvedilol (COREG) 12.5 MG tablet Take 12.5 mg by mouth 2 (two) times daily with a meal.       . Cyanocobalamin (VITAMIN B-12 PO) Take 1 tablet by mouth every other day.      . cyclobenzaprine (FLEXERIL) 10 MG tablet Take 10 mg by mouth 3 (three) times daily as needed for muscle spasms.       . cycloSPORINE (RESTASIS) 0.05 % ophthalmic emulsion Place 1 drop into both eyes 2 (two) times daily.      Marland Kitchen doxycycline (VIBRAMYCIN) 100 MG capsule Take 1 capsule (100 mg total) by mouth 2 (two) times daily.  14 capsule  0  . gabapentin (NEURONTIN) 100 MG capsule Take 100 mg by mouth 2 (two) times daily.      Marland Kitchen glipiZIDE (GLUCOTROL) 10 MG tablet Take 10 mg by mouth daily.        Marland Kitchen HYDROcodone-acetaminophen (NORCO/VICODIN) 5-325 MG per tablet Take 1 tablet by mouth every 4 (four) hours as needed for pain.      . hydroxypropyl methylcellulose (ISOPTO TEARS) 2.5 % ophthalmic solution  Place 1 drop into both eyes 3 (three) times daily as needed (dry eyes).       . isosorbide-hydrALAZINE (BIDIL) 20-37.5 MG per tablet Take 1 tablet by mouth every 8 (eight) hours.  90 tablet  1  . lidocaine (LMX) 4 % cream Apply 1 application topically as needed (via atomizer).      . niacin 500 MG tablet Take 1,000 mg by mouth at bedtime.       Marland Kitchen omeprazole (PRILOSEC) 20 MG capsule Take 20 mg by mouth 2 (two) times daily as needed (acid reflux).       . polyethylene glycol (MIRALAX / GLYCOLAX) packet Take 17 g by mouth daily as needed (constipation). For constipation      . senna (SENOKOT) 8.6 MG tablet Take 2 tablets by mouth 2 (two) times daily.       . sertraline (ZOLOFT) 100 MG tablet Take 200 mg by mouth at bedtime.       . simvastatin (ZOCOR) 20 MG tablet Take 10 mg by mouth at bedtime.      . torsemide (DEMADEX) 20 MG tablet Take 2 tablets (40 mg total) by mouth daily.  60 tablet  1   No current  facility-administered medications for this encounter.     PHYSICAL EXAM: Filed Vitals:   12/30/12 1559  BP: 124/66  Pulse: 66  Weight: 224 lb 4 oz (101.719 kg)  SpO2: 98%    PHYSICAL EXAM  General: Elderly. NAD in wheelchair Neck: JVP 5-6 cm, no thyromegaly or thyroid nodule.  Lungs: Decreased in the bases  CV: Nondisplaced PMI. Heart regular S1/S2, increased P2, no S3/S4, 2/6 systolic murmur LLSB. No carotid bruit.  Abdomen: Nontender. Large scar. +ventral hernia Neurologic: Alert and oriented x 3. Mild dysarthria Psych: Normal affect.  Extremities: No clubbing or cyanosis. No lower extremity edema.  Drop foot on R   ASSESSMENT & PLAN:

## 2013-01-10 ENCOUNTER — Emergency Department (INDEPENDENT_AMBULATORY_CARE_PROVIDER_SITE_OTHER)
Admission: EM | Admit: 2013-01-10 | Discharge: 2013-01-10 | Disposition: A | Discharge status: Home / Self Care | Payer: Medicare Other | Source: Home / Self Care | Attending: Emergency Medicine | Admitting: Emergency Medicine

## 2013-01-10 ENCOUNTER — Encounter (HOSPITAL_COMMUNITY): Payer: Self-pay | Admitting: Emergency Medicine

## 2013-01-10 ENCOUNTER — Emergency Department (INDEPENDENT_AMBULATORY_CARE_PROVIDER_SITE_OTHER): Payer: Medicare Other

## 2013-01-10 DIAGNOSIS — M25512 Pain in left shoulder: Secondary | ICD-10-CM

## 2013-01-10 DIAGNOSIS — M1990 Unspecified osteoarthritis, unspecified site: Secondary | ICD-10-CM

## 2013-01-10 DIAGNOSIS — M129 Arthropathy, unspecified: Secondary | ICD-10-CM

## 2013-01-10 DIAGNOSIS — M25519 Pain in unspecified shoulder: Secondary | ICD-10-CM

## 2013-01-10 MED ORDER — KETOROLAC TROMETHAMINE 30 MG/ML IJ SOLN
30.0000 mg | Freq: Once | INTRAMUSCULAR | Status: AC
  Administered 2013-01-10: 30 mg via INTRAMUSCULAR

## 2013-01-10 MED ORDER — KETOROLAC TROMETHAMINE 30 MG/ML IJ SOLN
INTRAMUSCULAR | Status: AC
  Filled 2013-01-10: qty 1

## 2013-01-10 NOTE — ED Provider Notes (Signed)
History     CSN: [id number]  Arrival date & time 01/10/13  1546   First MD Initiated Contact with Patient 01/10/13 1633      Chief Complaint  Patient presents with  . Shoulder Pain    (Consider location/radiation/quality/duration/timing/severity/associated sxs/prior treatment) HPI Comments: Patient presents urgent care complaining of anterior lateral left shoulder pain since Thursday. Patient describes that he is unable to move or raise his left arm he denies any recent falls or injuries. Describes that he has been noticing that the anterior aspect of his shoulder has become much more swollen, is tender with minimal movement and even to touch the area. He denies any fevers he has discuss this with his doctor at the [facility] which told him that it could be gout and was going to call him some prescriptions but the pharmacy didn't have this medicines for.  He has taken 2 hydrocodone as this morning which have not helped his pain. He denies any numbness or tingling sensations to his left upper extremity. Denies any chest pain or shortness of breath or neck pain.  Patient is a 79 y.o. male presenting with shoulder pain. The history is provided by the patient.  Shoulder Pain This is a new problem. Episode onset: 5 days. The problem occurs constantly. The problem has been gradually worsening. Pertinent negatives include no chest pain, no abdominal pain and no shortness of breath. Exacerbated by: movement. The symptoms are relieved by rest. He has tried nothing (hydrocodone) for the symptoms. The treatment provided no relief.    Past Medical History  Diagnosis Date  . Pneumonia   . Lymphoma     s/p partial gastrectomy  . Hypertension   . Hypercholesteremia   . Diabetes mellitus   . Gout   . Irregular heart beat   . Kidney stone     Past Surgical History  Procedure Laterality Date  . Partial gastrectomy  1994    for lymphoma  . Hernia repair    . Joint replacement    . Back surgery     . Esophagogastroduodenoscopy N/A 10/28/2012    Procedure: ESOPHAGOGASTRODUODENOSCOPY (EGD);  Surgeon: Shirley Friar, MD;  Location: [facility];  Service: Endoscopy;  Laterality: N/A;    History reviewed. No pertinent family history.  History  Substance Use Topics  . Smoking status: Never Smoker   . Smokeless tobacco: Never Used  . Alcohol Use: No      Review of Systems  Constitutional: Positive for activity change and appetite change. Negative for fever, chills, diaphoresis and fatigue.  Respiratory: Negative for apnea, chest tightness, shortness of breath and wheezing.   Cardiovascular: Negative for chest pain, palpitations and leg swelling.  Gastrointestinal: Negative for abdominal pain.  Musculoskeletal: Positive for joint swelling. Negative for myalgias and back pain.  Skin: Negative for color change, pallor, rash and wound.  Neurological: Negative for dizziness, facial asymmetry, weakness and numbness.  Hematological: Does not bruise/bleed easily.    Allergies  Review of patient's allergies indicates no known allergies.  Home Medications   Current Outpatient Rx  Name  Route  Sig  Dispense  Refill  . HYDROcodone-acetaminophen (NORCO/VICODIN) 5-325 MG per tablet   Oral   Take 1 tablet by mouth every 4 (four) hours as needed for pain.         Marland Kitchen acetaminophen (TYLENOL) 325 MG tablet   Oral   Take 325 mg by mouth every 6 (six) hours as needed for pain.          Marland Kitchen  albuterol (PROVENTIL HFA;VENTOLIN HFA) 108 (90 BASE) MCG/ACT inhaler   Inhalation   Inhale 2 puffs into the lungs every 6 (six) hours as needed for wheezing or shortness of breath.          Marland Kitchen amLODipine (NORVASC) 5 MG tablet   Oral   Take 1 tablet (5 mg total) by mouth daily.   30 tablet   1   . Artificial Tear Ointment OINT   Ophthalmic   Apply 1 application to eye at bedtime.          Marland Kitchen aspirin EC 81 MG tablet   Oral   Take 81 mg by mouth daily.         . bisacodyl (DULCOLAX)  5 MG EC tablet   Oral   Take 5 mg by mouth daily as needed for constipation.          . carvedilol (COREG) 12.5 MG tablet   Oral   Take 12.5 mg by mouth 2 (two) times daily with a meal.          . Cyanocobalamin (VITAMIN B-12 PO)   Oral   Take 1 tablet by mouth every other day.         . cyclobenzaprine (FLEXERIL) 10 MG tablet   Oral   Take 10 mg by mouth 3 (three) times daily as needed for muscle spasms.          . cycloSPORINE (RESTASIS) 0.05 % ophthalmic emulsion   Both Eyes   Place 1 drop into both eyes 2 (two) times daily.         Marland Kitchen doxycycline (VIBRAMYCIN) 100 MG capsule   Oral   Take 1 capsule (100 mg total) by mouth 2 (two) times daily.   14 capsule   0   . gabapentin (NEURONTIN) 100 MG capsule   Oral   Take 100 mg by mouth 2 (two) times daily.         Marland Kitchen glipiZIDE (GLUCOTROL) 10 MG tablet   Oral   Take 10 mg by mouth daily.           . hydroxypropyl methylcellulose (ISOPTO TEARS) 2.5 % ophthalmic solution   Both Eyes   Place 1 drop into both eyes 3 (three) times daily as needed (dry eyes).          . isosorbide-hydrALAZINE (BIDIL) 20-37.5 MG per tablet   Oral   Take 1 tablet by mouth every 8 (eight) hours.   90 tablet   1   . lidocaine (LMX) 4 % cream   Topical   Apply 1 application topically as needed (via atomizer).         . niacin 500 MG tablet   Oral   Take 1,000 mg by mouth at bedtime.          Marland Kitchen omeprazole (PRILOSEC) 20 MG capsule   Oral   Take 20 mg by mouth 2 (two) times daily as needed (acid reflux).          . polyethylene glycol (MIRALAX / GLYCOLAX) packet   Oral   Take 17 g by mouth daily as needed (constipation). For constipation         . senna (SENOKOT) 8.6 MG tablet   Oral   Take 2 tablets by mouth 2 (two) times daily.          . sertraline (ZOLOFT) 100 MG tablet   Oral   Take 200 mg by mouth at bedtime.          Marland Kitchen  simvastatin (ZOCOR) 20 MG tablet   Oral   Take 10 mg by mouth at bedtime.           . torsemide (DEMADEX) 20 MG tablet   Oral   Take 2 tablets (40 mg total) by mouth daily.   60 tablet   1     BP 125/79  Pulse 78  Temp(Src) 99 F (37.2 C) (Oral)  Resp 20  SpO2 97%  Physical Exam  Nursing note and vitals reviewed. Constitutional: Vital signs are normal.  Non-toxic appearance. He does not have a sickly appearance. He does not appear ill. No distress.  HENT:  Head: Normocephalic.  Eyes: No scleral icterus.  Neck: Trachea normal. Neck supple. No JVD present. No Kernig's sign noted.  Pulmonary/Chest: Effort normal and breath sounds normal. No respiratory distress. He has no wheezes.  Musculoskeletal: He exhibits tenderness.       Left shoulder: He exhibits decreased range of motion, tenderness, swelling, deformity and pain. He exhibits no bony tenderness, no effusion, no crepitus, normal pulse and normal strength.       Arms: Neurological: He is alert.  Skin: No rash noted. No erythema.    ED Course  Procedures (including critical care time)  Labs Reviewed - No data to display Dg Shoulder Left  01/10/2013  *RADIOLOGY REPORT*  Clinical Data: Left shoulder pain for 3-4 years worse in the past 4 days  LEFT SHOULDER - 2+ VIEW  Comparison: 10/19/2009  Findings: Advanced degenerative changes at left [facility] joint with progressive destruction and spur formation. Lateral downsloping of acromion may predispose patient to rotator cuff pathology. Superior subluxation of the left humeral head approximating the undersurface of acromion consistent with chronic rotator cuff tear. Glenohumeral degenerative changes with spur formation. No acute fracture, dislocation or bone destruction. Visualized left ribs intact.  IMPRESSION: Acromioclavicular and glenohumeral degenerative changes with chronic rotator cuff tear. No definite acute abnormalities.   Original Report Authenticated By: Ulyses Southward, M.D.      1. Shoulder joint pain, left   2. Arthritis   3-Adhesive  capsulitis    MDM   Severe- DEGENERATIVE LEFT A.C. JOINT CHANGES AND OTHER SIGNS CONSISTENT WITH A CHRONIC ROTATOR CUFF TEAR. PATIENT HAS BEEN, seen by orthopedic at [facility] on multiple co-morbidities from patient description sounds like he is not a candidate for surgical repair. Have recommended patient to see his orthopedic doctor to pursue local injections in guided physical therapy. Given severity have explained to patient that would be most advisable to have this injections done by his orthopedic provider      a  Jimmie Molly, MD 01/10/13 1758

## 2013-01-10 NOTE — ED Notes (Signed)
Per Dr. Ladon Applebaum, he prefers for pt not to drive home and wanted to see if any one could come and pick him up Asked pt if someone could come and pick him up; he said no and he wants to go home and doesn't want to leave his car here. Dr. Ladon Applebaum is aware and said he is ok to go home and to provide pt w/sling to take

## 2013-01-10 NOTE — ED Notes (Signed)
Pt c/o left shoulder pain onset Thursday Pain is constant and increases w/activity; hurts to raise above head Denies: inj/trauma to site Hx arthritis and gout  He is alert and oriented w/no signs of acute distress.

## 2013-01-23 ENCOUNTER — Encounter (HOSPITAL_COMMUNITY): Payer: Self-pay | Admitting: Nurse Practitioner

## 2013-01-23 ENCOUNTER — Emergency Department (HOSPITAL_COMMUNITY)
Admission: EM | Admit: 2013-01-23 | Discharge: 2013-01-23 | Disposition: A | Discharge status: Home / Self Care | Payer: Medicare Other | Attending: Emergency Medicine | Admitting: Emergency Medicine

## 2013-01-23 DIAGNOSIS — I499 Cardiac arrhythmia, unspecified: Secondary | ICD-10-CM | POA: Insufficient documentation

## 2013-01-23 DIAGNOSIS — E7800 Pure hypercholesterolemia, unspecified: Secondary | ICD-10-CM | POA: Insufficient documentation

## 2013-01-23 DIAGNOSIS — Z79899 Other long term (current) drug therapy: Secondary | ICD-10-CM | POA: Insufficient documentation

## 2013-01-23 DIAGNOSIS — E119 Type 2 diabetes mellitus without complications: Secondary | ICD-10-CM | POA: Insufficient documentation

## 2013-01-23 DIAGNOSIS — I10 Essential (primary) hypertension: Secondary | ICD-10-CM | POA: Insufficient documentation

## 2013-01-23 DIAGNOSIS — Z7982 Long term (current) use of aspirin: Secondary | ICD-10-CM | POA: Insufficient documentation

## 2013-01-23 DIAGNOSIS — R350 Frequency of micturition: Secondary | ICD-10-CM | POA: Insufficient documentation

## 2013-01-23 DIAGNOSIS — Z87442 Personal history of urinary calculi: Secondary | ICD-10-CM | POA: Insufficient documentation

## 2013-01-23 DIAGNOSIS — Z87898 Personal history of other specified conditions: Secondary | ICD-10-CM | POA: Insufficient documentation

## 2013-01-23 DIAGNOSIS — E876 Hypokalemia: Secondary | ICD-10-CM

## 2013-01-23 DIAGNOSIS — Z8701 Personal history of pneumonia (recurrent): Secondary | ICD-10-CM | POA: Insufficient documentation

## 2013-01-23 DIAGNOSIS — M109 Gout, unspecified: Secondary | ICD-10-CM | POA: Insufficient documentation

## 2013-01-23 LAB — COMPREHENSIVE METABOLIC PANEL
BUN: 41 mg/dL — ABNORMAL HIGH (ref 6–23)
CO2: 21 mEq/L (ref 19–32)
Calcium: 8.8 mg/dL (ref 8.4–10.5)
Creatinine, Ser: 1.91 mg/dL — ABNORMAL HIGH (ref 0.50–1.35)
GFR calc Af Amer: 37 mL/min — ABNORMAL LOW (ref >90)
GFR calc non Af Amer: 32 mL/min — ABNORMAL LOW (ref >90)
Glucose, Bld: 223 mg/dL — ABNORMAL HIGH (ref 70–99)
Sodium: 137 mEq/L (ref 135–145)
Total Protein: 6.5 g/dL (ref 6.0–8.3)

## 2013-01-23 LAB — CBC
HCT: 32 % — ABNORMAL LOW (ref 39.0–52.0)
Hemoglobin: 10.9 g/dL — ABNORMAL LOW (ref 13.0–17.0)
WBC: 5.1 10*3/uL (ref 4.0–10.5)

## 2013-01-23 MED ORDER — OXYCODONE-ACETAMINOPHEN 5-325 MG PO TABS: 2.0000 | ORAL_TABLET | Freq: Once | ORAL | Status: DC

## 2013-01-23 MED ORDER — POTASSIUM CHLORIDE CRYS ER 20 MEQ PO TBCR
20.0000 meq | EXTENDED_RELEASE_TABLET | Freq: Once | ORAL | Status: AC
  Administered 2013-01-23: 20 meq via ORAL
  Filled 2013-01-23: qty 1

## 2013-01-23 MED ORDER — OXYCODONE-ACETAMINOPHEN 5-325 MG PO TABS
2.0000 | ORAL_TABLET | Freq: Once | ORAL | Status: AC
  Administered 2013-01-23: 2 via ORAL
  Filled 2013-01-23: qty 2

## 2013-01-23 MED ORDER — POTASSIUM CHLORIDE CRYS ER 20 MEQ PO TBCR: 20.0000 meq | EXTENDED_RELEASE_TABLET | Freq: Every day | ORAL | Status: DC

## 2013-01-23 NOTE — ED Notes (Signed)
Pt states he still cannot urinate. MD updated.

## 2013-01-23 NOTE — ED Notes (Signed)
Pt continues to be unable to provide a urine sample.

## 2013-01-23 NOTE — ED Notes (Signed)
MD states pt to follow up with PCP for UA. Pt voiced understanding

## 2013-01-23 NOTE — ED Notes (Signed)
Reports increased urinary frequency since yesterday, denies pain, reports he does take a fluid pill but normally does not have to urinate this much.

## 2013-01-23 NOTE — ED Provider Notes (Signed)
History     CSN: [id number]  Arrival date & time 01/23/13  1251   First MD Initiated Contact with Patient 01/23/13 1300      Chief Complaint  Patient presents with  . Urinary Frequency    HPI Reports increased urinary frequency since yesterday, denies pain, reports he does take a fluid pill but normally does not have to urinate this much.  Patient denies fever chills.  Denies back pain, abdominal pain or distention. Past Medical History  Diagnosis Date  . Pneumonia   . Lymphoma     s/p partial gastrectomy  . Hypertension   . Hypercholesteremia   . Diabetes mellitus   . Gout   . Irregular heart beat   . Kidney stone     Past Surgical History  Procedure Laterality Date  . Partial gastrectomy  1994    for lymphoma  . Hernia repair    . Joint replacement    . Back surgery    . Esophagogastroduodenoscopy N/A 10/28/2012    Procedure: ESOPHAGOGASTRODUODENOSCOPY (EGD);  Surgeon: Shirley Friar, MD;  Location: [facility];  Service: Endoscopy;  Laterality: N/A;    History reviewed. No pertinent family history.  History  Substance Use Topics  . Smoking status: Never Smoker   . Smokeless tobacco: Never Used  . Alcohol Use: No      Review of Systems All other systems reviewed and are negative Allergies  Review of patient's allergies indicates no known allergies.  Home Medications   Current Outpatient Rx  Name  Route  Sig  Dispense  Refill  . acetaminophen (TYLENOL) 325 MG tablet   Oral   Take 325 mg by mouth every 6 (six) hours as needed for pain.          Marland Kitchen albuterol (PROVENTIL HFA;VENTOLIN HFA) 108 (90 BASE) MCG/ACT inhaler   Inhalation   Inhale 2 puffs into the lungs every 6 (six) hours as needed for wheezing or shortness of breath.          Marland Kitchen amLODipine (NORVASC) 5 MG tablet   Oral   Take 1 tablet (5 mg total) by mouth daily.   30 tablet   1   . Artificial Tear Ointment OINT   Ophthalmic   Apply 1 application to eye at bedtime.            Marland Kitchen aspirin EC 81 MG tablet   Oral   Take 81 mg by mouth daily.         . bisacodyl (DULCOLAX) 5 MG EC tablet   Oral   Take 5 mg by mouth daily as needed for constipation.          . carvedilol (COREG) 12.5 MG tablet   Oral   Take 12.5 mg by mouth 2 (two) times daily with a meal.          . Cyanocobalamin (VITAMIN B-12 PO)   Oral   Take 1 tablet by mouth every other day.         . cyclobenzaprine (FLEXERIL) 10 MG tablet   Oral   Take 10 mg by mouth 3 (three) times daily as needed for muscle spasms.          . cycloSPORINE (RESTASIS) 0.05 % ophthalmic emulsion   Both Eyes   Place 1 drop into both eyes 2 (two) times daily.         Marland Kitchen gabapentin (NEURONTIN) 100 MG capsule   Oral   Take 100 mg by mouth 2 (  two) times daily.         Marland Kitchen glipiZIDE (GLUCOTROL) 10 MG tablet   Oral   Take 10 mg by mouth daily.           Marland Kitchen HYDROcodone-acetaminophen (NORCO/VICODIN) 5-325 MG per tablet   Oral   Take 1 tablet by mouth every 4 (four) hours as needed for pain. For pain         . hydroxypropyl methylcellulose (ISOPTO TEARS) 2.5 % ophthalmic solution   Both Eyes   Place 1 drop into both eyes 3 (three) times daily as needed (dry eyes).          . isosorbide-hydrALAZINE (BIDIL) 20-37.5 MG per tablet   Oral   Take 1 tablet by mouth every 8 (eight) hours.   90 tablet   1   . lidocaine (LMX) 4 % cream   Topical   Apply 1 application topically as needed (via atomizer).         . niacin 500 MG tablet   Oral   Take 1,000 mg by mouth at bedtime.          Marland Kitchen omeprazole (PRILOSEC) 20 MG capsule   Oral   Take 20 mg by mouth 2 (two) times daily as needed (acid reflux).          . polyethylene glycol (MIRALAX / GLYCOLAX) packet   Oral   Take 17 g by mouth daily as needed (constipation). For constipation         . sertraline (ZOLOFT) 100 MG tablet   Oral   Take 200 mg by mouth at bedtime.          . simvastatin (ZOCOR) 20 MG tablet   Oral   Take 10 mg by  mouth at bedtime.         . torsemide (DEMADEX) 20 MG tablet   Oral   Take 2 tablets (40 mg total) by mouth daily.   60 tablet   1   . oxyCODONE-acetaminophen (PERCOCET/ROXICET) 5-325 MG per tablet   Oral   Take 2 tablets by mouth once.   30 tablet   0   . potassium chloride SA (K-DUR,KLOR-CON) 20 MEQ tablet   Oral   Take 1 tablet (20 mEq total) by mouth daily.   10 tablet   0     BP 120/63  Pulse 99  Temp(Src) 98 F (36.7 C) (Oral)  Resp 16  SpO2 98%  Physical Exam  Nursing note and vitals reviewed. Constitutional: He is oriented to person, place, and time. He appears well-developed and well-nourished. No distress.  HENT:  Head: Normocephalic and atraumatic.  Eyes: Pupils are equal, round, and reactive to light.  Neck: Normal range of motion.  Cardiovascular: Normal rate and intact distal pulses.   Pulmonary/Chest: No respiratory distress.  Abdominal: Normal appearance. He exhibits no distension. There is no tenderness. There is no rebound and no guarding.  Musculoskeletal: He exhibits tenderness.       Legs: Neurological: He is alert and oriented to person, place, and time. No cranial nerve deficit.  Skin: Skin is warm and dry. No rash noted.  Psychiatric: He has a normal mood and affect. His behavior is normal.    ED Course  Procedures (including critical care time) Medications  oxyCODONE-acetaminophen (PERCOCET/ROXICET) 5-325 MG per tablet 2 tablet (2 tablets Oral Given 01/23/13 1424)  potassium chloride SA (K-DUR,KLOR-CON) CR tablet 20 mEq (20 mEq Oral Given 01/23/13 1610)     Results for orders placed  during the hospital encounter of 01/23/13  COMPREHENSIVE METABOLIC PANEL      Result Value Range   Sodium 137  135 - 145 mEq/L   Potassium 3.0 (*) 3.5 - 5.1 mEq/L   Chloride 101  96 - 112 mEq/L   CO2 21  19 - 32 mEq/L   Glucose, Bld 223 (*) 70 - 99 mg/dL   BUN 41 (*) 6 - 23 mg/dL   Creatinine, Ser 1.61 (*) 0.50 - 1.35 mg/dL   Calcium 8.8  8.4 - 09.6  mg/dL   Total Protein 6.5  6.0 - 8.3 g/dL   Albumin 3.1 (*) 3.5 - 5.2 g/dL   AST 44 (*) 0 - 37 U/L   ALT 29  0 - 53 U/L   Alkaline Phosphatase 111  39 - 117 U/L   Total Bilirubin 0.5  0.3 - 1.2 mg/dL   GFR calc non Af Amer 32 (*) >90 mL/min   GFR calc Af Amer 37 (*) >90 mL/min  CBC      Result Value Range   WBC 5.1  4.0 - 10.5 K/uL   RBC 3.69 (*) 4.22 - 5.81 MIL/uL   Hemoglobin 10.9 (*) 13.0 - 17.0 g/dL   HCT 04.5 (*) 40.9 - 81.1 %   MCV 86.7  78.0 - 100.0 fL   MCH 29.5  26.0 - 34.0 pg   MCHC 34.1  30.0 - 36.0 g/dL   RDW 91.4  78.2 - 95.6 %   Platelets 188  150 - 400 K/uL   Dg Shoulder Left  01/10/2013   *RADIOLOGY REPORT*  Clinical Data: Left shoulder pain for 3-4 years worse in the past 4 days  LEFT SHOULDER - 2+ VIEW  Comparison: 10/19/2009  Findings: Advanced degenerative changes at left [facility] joint with progressive destruction and spur formation. Lateral downsloping of acromion may predispose patient to rotator cuff pathology. Superior subluxation of the left humeral head approximating the undersurface of acromion consistent with chronic rotator cuff tear. Glenohumeral degenerative changes with spur formation. No acute fracture, dislocation or bone destruction. Visualized left ribs intact.  IMPRESSION: Acromioclavicular and glenohumeral degenerative changes with chronic rotator cuff tear. No definite acute abnormalities.   Original Report Authenticated By: Ulyses Southward, M.D.      1. Urinary frequency   2. Hypokalemia       MDM  Patient was in the emergency room over 3 hours with no urine.  Patient did not want to be catheterized.  Patient be discharged home with the urine collection for total return for urinalysis.  Patient will be treated with potassium for a few days with insructions for follow up at his PCP.        Nelia Shi, MD 01/23/13 [phone]

## 2013-01-23 NOTE — ED Notes (Signed)
Pt states yesterday he "had a lot of urine." Pt states he was also lightheaded. Pt states he has not had any frequency today. Pt denies any new pain.

## 2013-01-25 DIAGNOSIS — I10 Essential (primary) hypertension: Secondary | ICD-10-CM

## 2013-01-25 DIAGNOSIS — J449 Chronic obstructive pulmonary disease, unspecified: Secondary | ICD-10-CM

## 2013-01-25 DIAGNOSIS — I2510 Atherosclerotic heart disease of native coronary artery without angina pectoris: Secondary | ICD-10-CM

## 2013-01-25 DIAGNOSIS — I509 Heart failure, unspecified: Secondary | ICD-10-CM

## 2013-01-27 ENCOUNTER — Other Ambulatory Visit (HOSPITAL_COMMUNITY): Payer: Self-pay | Admitting: *Deleted

## 2013-01-27 ENCOUNTER — Other Ambulatory Visit (HOSPITAL_COMMUNITY): Payer: Self-pay | Admitting: Internal Medicine

## 2013-01-27 MED ORDER — POTASSIUM CHLORIDE CRYS ER 20 MEQ PO TBCR: 20.0000 meq | EXTENDED_RELEASE_TABLET | Freq: Every day | ORAL | Status: DC

## 2013-01-27 MED ORDER — TORSEMIDE 20 MG PO TABS: 40.0000 mg | ORAL_TABLET | Freq: Every day | ORAL | Status: DC

## 2013-03-24 ENCOUNTER — Other Ambulatory Visit (HOSPITAL_COMMUNITY): Payer: Self-pay | Admitting: Internal Medicine

## 2013-04-12 ENCOUNTER — Other Ambulatory Visit (HOSPITAL_COMMUNITY): Payer: Self-pay | Admitting: Cardiology

## 2013-04-12 DIAGNOSIS — I509 Heart failure, unspecified: Secondary | ICD-10-CM

## 2013-04-12 MED ORDER — TORSEMIDE 20 MG PO TABS: ORAL_TABLET | ORAL | Status: DC

## 2013-04-12 MED ORDER — SIMVASTATIN 20 MG PO TABS: 10.0000 mg | ORAL_TABLET | Freq: Every day | ORAL | Status: DC

## 2013-04-12 NOTE — Telephone Encounter (Signed)
Pt requested a refill on meds

## 2013-04-13 ENCOUNTER — Encounter (HOSPITAL_COMMUNITY): Payer: Self-pay | Admitting: *Deleted

## 2013-04-13 ENCOUNTER — Emergency Department (HOSPITAL_COMMUNITY): Payer: Medicare Other

## 2013-04-13 ENCOUNTER — Emergency Department (HOSPITAL_COMMUNITY)
Admission: EM | Admit: 2013-04-13 | Discharge: 2013-04-13 | Disposition: A | Discharge status: Home / Self Care | Payer: Medicare Other | Attending: Emergency Medicine | Admitting: Emergency Medicine

## 2013-04-13 DIAGNOSIS — R05 Cough: Secondary | ICD-10-CM | POA: Insufficient documentation

## 2013-04-13 DIAGNOSIS — Z859 Personal history of malignant neoplasm, unspecified: Secondary | ICD-10-CM | POA: Insufficient documentation

## 2013-04-13 DIAGNOSIS — Z87442 Personal history of urinary calculi: Secondary | ICD-10-CM | POA: Insufficient documentation

## 2013-04-13 DIAGNOSIS — E7800 Pure hypercholesterolemia, unspecified: Secondary | ICD-10-CM | POA: Insufficient documentation

## 2013-04-13 DIAGNOSIS — G8929 Other chronic pain: Secondary | ICD-10-CM | POA: Insufficient documentation

## 2013-04-13 DIAGNOSIS — Z862 Personal history of diseases of the blood and blood-forming organs and certain disorders involving the immune mechanism: Secondary | ICD-10-CM | POA: Insufficient documentation

## 2013-04-13 DIAGNOSIS — Z79899 Other long term (current) drug therapy: Secondary | ICD-10-CM | POA: Insufficient documentation

## 2013-04-13 DIAGNOSIS — M25569 Pain in unspecified knee: Secondary | ICD-10-CM | POA: Insufficient documentation

## 2013-04-13 DIAGNOSIS — Z8701 Personal history of pneumonia (recurrent): Secondary | ICD-10-CM | POA: Insufficient documentation

## 2013-04-13 DIAGNOSIS — I10 Essential (primary) hypertension: Secondary | ICD-10-CM | POA: Insufficient documentation

## 2013-04-13 DIAGNOSIS — Z8639 Personal history of other endocrine, nutritional and metabolic disease: Secondary | ICD-10-CM | POA: Insufficient documentation

## 2013-04-13 DIAGNOSIS — Z8679 Personal history of other diseases of the circulatory system: Secondary | ICD-10-CM | POA: Insufficient documentation

## 2013-04-13 DIAGNOSIS — R059 Cough, unspecified: Secondary | ICD-10-CM | POA: Insufficient documentation

## 2013-04-13 DIAGNOSIS — Z7982 Long term (current) use of aspirin: Secondary | ICD-10-CM | POA: Insufficient documentation

## 2013-04-13 LAB — URINE MICROSCOPIC-ADD ON

## 2013-04-13 LAB — CBC
HCT: 34.6 % — ABNORMAL LOW (ref 39.0–52.0)
Hemoglobin: 12 g/dL — ABNORMAL LOW (ref 13.0–17.0)
MCH: 31 pg (ref 26.0–34.0)
MCHC: 34.7 g/dL (ref 30.0–36.0)
MCV: 89.4 fL (ref 78.0–100.0)
Platelets: 180 10*3/uL (ref 150–400)
RBC: 3.87 MIL/uL — ABNORMAL LOW (ref 4.22–5.81)
RDW: 14.5 % (ref 11.5–15.5)
WBC: 12.2 10*3/uL — ABNORMAL HIGH (ref 4.0–10.5)

## 2013-04-13 LAB — COMPREHENSIVE METABOLIC PANEL
BUN: 40 mg/dL — ABNORMAL HIGH (ref 6–23)
CO2: 27 mEq/L (ref 19–32)
Calcium: 9.2 mg/dL (ref 8.4–10.5)
Creatinine, Ser: 1.94 mg/dL — ABNORMAL HIGH (ref 0.50–1.35)
GFR calc Af Amer: 36 mL/min — ABNORMAL LOW (ref >90)
GFR calc non Af Amer: 31 mL/min — ABNORMAL LOW (ref >90)
Glucose, Bld: 144 mg/dL — ABNORMAL HIGH (ref 70–99)

## 2013-04-13 LAB — URINALYSIS, ROUTINE W REFLEX MICROSCOPIC
Bilirubin Urine: NEGATIVE
Glucose, UA: NEGATIVE mg/dL
Hgb urine dipstick: NEGATIVE
Ketones, ur: NEGATIVE mg/dL
Leukocytes, UA: NEGATIVE
Nitrite: NEGATIVE
Protein, ur: 100 mg/dL — AB
Specific Gravity, Urine: 1.01 (ref 1.005–1.030)
Urobilinogen, UA: 0.2 mg/dL (ref 0.0–1.0)
pH: 5.5 (ref 5.0–8.0)

## 2013-04-13 LAB — SYNOVIAL CELL COUNT + DIFF, W/ CRYSTALS
Eosinophils-Synovial: 0 % (ref 0–1)
Neutrophil, Synovial: 8 % (ref 0–25)

## 2013-04-13 LAB — GLUCOSE, CAPILLARY: Glucose-Capillary: 193 mg/dL — ABNORMAL HIGH (ref 70–99)

## 2013-04-13 LAB — GRAM STAIN

## 2013-04-13 MED ORDER — POTASSIUM CHLORIDE CRYS ER 20 MEQ PO TBCR
40.0000 meq | EXTENDED_RELEASE_TABLET | Freq: Once | ORAL | Status: AC
  Administered 2013-04-13: 40 meq via ORAL
  Filled 2013-04-13: qty 2

## 2013-04-13 MED ORDER — OXYCODONE-ACETAMINOPHEN 5-325 MG PO TABS
1.0000 | ORAL_TABLET | Freq: Once | ORAL | Status: AC
  Administered 2013-04-13: 1 via ORAL
  Filled 2013-04-13: qty 1

## 2013-04-13 NOTE — ED Notes (Signed)
PT states had teeth pulled yesterday and blood sugar starting rising today into the 400s.

## 2013-04-13 NOTE — ED Provider Notes (Signed)
CSN: [id number]     Arrival date & time 04/13/13  1429 History     First MD Initiated Contact with Patient 04/13/13 1530     Chief Complaint  Patient presents with  . Hyperglycemia   (Consider location/radiation/quality/duration/timing/severity/associated sxs/prior Treatment) HPI   79 year old male with a past medical history of hypertension, diabetes, kidney stones and lymphoma.  The patient also CT of the history of heart failure.  He is followed at the [place].  Patient states that this morning when he took his blood sugar is 166.  When each rechecked later his blood sugar was in the 400s.  He states that this is abnormal.  He checked it a second time on his back up glucose monitor and it was also greater than 400.  The patient came to the emergency department seeking treatment for high sugars.  Patient has had a productive cough for the past 2 weeks.  He denies any unusual abdominal pain although he does have some chronic abdominal pain to palpation deep to multiple abdominal hernias.  Denies any burning with urination, difficulty urinating, hematuria or flank pain.  The patient has chronic right knee pain and effusion.  He also has a history of gout.  He keeps his leg wrapped in with a brace on to reduce swelling.  He states that he has to get corticosteroid injections in the knee every few months at the [place] Denies fevers, chills, myalgias, arthralgias. Denies DOE, SOB, chest tightness or pressure, radiation to left arm, jaw or back, or diaphoresis. Denies headaches, light headedness, weakness, visual disturbances. Denies abdominal pain, nausea, vomiting, diarrhea or constipation.   Past Medical History  Diagnosis Date  . Pneumonia   . Lymphoma     s/p partial gastrectomy  . Hypertension   . Hypercholesteremia   . Diabetes mellitus   . Gout   . Irregular heart beat   . Kidney stone    Past Surgical History  Procedure Laterality Date  . Partial gastrectomy  1994    for lymphoma  .  Hernia repair    . Joint replacement    . Back surgery    . Esophagogastroduodenoscopy N/A 10/28/2012    Procedure: ESOPHAGOGASTRODUODENOSCOPY (EGD);  Surgeon: Shirley Friar, MD;  Location: [facility];  Service: Endoscopy;  Laterality: N/A;   No family history on file. History  Substance Use Topics  . Smoking status: Never Smoker   . Smokeless tobacco: Never Used  . Alcohol Use: No    Review of Systems Ten systems reviewed and are negative for acute change, except as noted in the HPI.   Allergies  Review of patient's allergies indicates no known allergies.  Home Medications   Current Outpatient Rx  Name  Route  Sig  Dispense  Refill  . acetaminophen (TYLENOL) 325 MG tablet   Oral   Take 325 mg by mouth every 6 (six) hours as needed for pain.          Marland Kitchen albuterol (PROVENTIL HFA;VENTOLIN HFA) 108 (90 BASE) MCG/ACT inhaler   Inhalation   Inhale 2 puffs into the lungs every 6 (six) hours as needed for wheezing or shortness of breath.          Marland Kitchen amLODipine (NORVASC) 5 MG tablet   Oral   Take 1 tablet (5 mg total) by mouth daily.   30 tablet   1   . Artificial Tear Ointment OINT   Ophthalmic   Apply 1 application to eye at bedtime.          Marland Kitchen  aspirin EC 81 MG tablet   Oral   Take 81 mg by mouth daily.         . bisacodyl (DULCOLAX) 5 MG EC tablet   Oral   Take 5 mg by mouth daily as needed for constipation.          . carvedilol (COREG) 12.5 MG tablet   Oral   Take 12.5 mg by mouth 2 (two) times daily with a meal.          . Cyanocobalamin (VITAMIN B-12 PO)   Oral   Take 1 tablet by mouth every other day.         . cyclobenzaprine (FLEXERIL) 10 MG tablet   Oral   Take 10 mg by mouth 3 (three) times daily as needed for muscle spasms.          . cycloSPORINE (RESTASIS) 0.05 % ophthalmic emulsion   Both Eyes   Place 1 drop into both eyes 2 (two) times daily.         Marland Kitchen gabapentin (NEURONTIN) 100 MG capsule   Oral   Take 100 mg by  mouth 2 (two) times daily.         Marland Kitchen glipiZIDE (GLUCOTROL) 10 MG tablet   Oral   Take 10 mg by mouth daily.           Marland Kitchen HYDROcodone-acetaminophen (NORCO/VICODIN) 5-325 MG per tablet   Oral   Take 1 tablet by mouth every 4 (four) hours as needed for pain. For pain         . hydroxypropyl methylcellulose (ISOPTO TEARS) 2.5 % ophthalmic solution   Both Eyes   Place 1 drop into both eyes 3 (three) times daily as needed (dry eyes).          . isosorbide-hydrALAZINE (BIDIL) 20-37.5 MG per tablet   Oral   Take 1 tablet by mouth every 8 (eight) hours.   90 tablet   1   . lidocaine (LMX) 4 % cream   Topical   Apply 1 application topically as needed (via atomizer).         . niacin 500 MG tablet   Oral   Take 1,000 mg by mouth at bedtime.          Marland Kitchen omeprazole (PRILOSEC) 20 MG capsule   Oral   Take 20 mg by mouth 2 (two) times daily as needed (acid reflux).          Marland Kitchen oxyCODONE-acetaminophen (PERCOCET/ROXICET) 5-325 MG per tablet   Oral   Take 2 tablets by mouth once.   30 tablet   0   . polyethylene glycol (MIRALAX / GLYCOLAX) packet   Oral   Take 17 g by mouth daily as needed (constipation). For constipation         . potassium chloride SA (K-DUR,KLOR-CON) 20 MEQ tablet   Oral   Take 1 tablet (20 mEq total) by mouth daily.   30 tablet   1   . sertraline (ZOLOFT) 100 MG tablet   Oral   Take 200 mg by mouth at bedtime.          . simvastatin (ZOCOR) 20 MG tablet   Oral   Take 0.5 tablets (10 mg total) by mouth at bedtime.   30 tablet   1   . torsemide (DEMADEX) 20 MG tablet      TAKE 2 TABLETS BY MOUTH DAILY   60 tablet   1    BP 118/78  Pulse  60  Temp(Src) 97.7 F (36.5 C) (Oral)  Resp 20  SpO2 100% Physical Exam  Nursing note and vitals reviewed. Constitutional:  Chronically ill appearing  HENT:  Head: Normocephalic and atraumatic.  Eyes: Conjunctivae are normal. No scleral icterus.  Neck: Normal range of motion. Neck supple.   Cardiovascular: Normal rate, regular rhythm and normal heart sounds.   Pulmonary/Chest: Effort normal.  Ronchi that clear with cough  Abdominal: Soft. There is no tenderness.  Musculoskeletal: He exhibits no edema.  R Knee with large effusion. He is unable to flex to 90 but can bend the knee. Warm to palpatin and skin is erythematous  Neurological: He is alert.  Skin: Skin is warm and dry.  Psychiatric: His behavior is normal.    ED Course   ARTHOCENTESIS Date/Time: 04/13/2013 7:04 PM Performed by: Arthor Captain Authorized by: Arthor Captain Consent: Verbal consent obtained. Risks and benefits: risks, benefits and alternatives were discussed Consent given by: patient Patient understanding: patient states understanding of the procedure being performed Site marked: the operative site was marked Imaging studies: imaging studies available Patient identity confirmed: verbally with patient Time out: Immediately prior to procedure a "time out" was called to verify the correct patient, procedure, equipment, support staff and site/side marked as required. Indications: joint swelling, pain and diagnostic evaluation  Body area: knee Joint: right knee Local anesthesia used: yes Local anesthetic: lidocaine 2% without epinephrine Anesthetic total: 3 ml Patient sedated: no Preparation: Patient was prepped and draped in the usual sterile fashion. Needle gauge: 18 G Approach: lateral Aspirate: blood-tinged, yellow and cloudy Aspirate amount: 20 ml Patient tolerance: Patient tolerated the procedure well with no immediate complications.   (including critical care time)  Labs Reviewed  GLUCOSE, CAPILLARY - Abnormal; Notable for the following:    Glucose-Capillary 193 (*)    All other components within normal limits  CBC - Abnormal; Notable for the following:    WBC 12.2 (*)    RBC 3.87 (*)    Hemoglobin 12.0 (*)    HCT 34.6 (*)    All other components within normal limits   COMPREHENSIVE METABOLIC PANEL - Abnormal; Notable for the following:    Potassium 3.0 (*)    Glucose, Bld 144 (*)    BUN 40 (*)    Creatinine, Ser 1.94 (*)    Albumin 3.4 (*)    Alkaline Phosphatase 164 (*)    GFR calc non Af Amer 31 (*)    GFR calc Af Amer 36 (*)    All other components within normal limits  URINALYSIS, ROUTINE W REFLEX MICROSCOPIC - Abnormal; Notable for the following:    Protein, ur 100 (*)    All other components within normal limits  URINE MICROSCOPIC-ADD ON - Abnormal; Notable for the following:    Casts HYALINE CASTS (*)    All other components within normal limits  BODY FLUID CULTURE  GRAM STAIN  CELL COUNT + DIFF,  W/ CRYST-SYNVL FLD  GLUCOSE, SYNOVIAL FLUID   No results found. 1. Knee pain, chronic, right     Patient with normal blood sugar.  Concern for possible URI deep to productive cough.  Patient's knee is warm and do not suspect a septic joint however perform aspiration for fluid assessment.  Patient does have a history of gout.      7:12 PM Filed Vitals:   04/13/13 1857  BP: 143/67  Pulse: 56  Temp: 98 F (36.7 C)  Resp: 16   Patient stable.  He  is a given 25 mg Percocet.  Potassium is low we'll give 40 mEq potassium by oral repletion.  He is elevated creatinine but appears at baseline.  Body fluid examination is currently pending.    Synovial fluid with elevated white count however is less than 50,000.  Do not suspect any septic joint periods probably related to chronic arthritis and effusion. She appears safe for discharge at this time he should followup with his providers at the [facility] and/or his primary care provider here in [place].  Arthor Captain, PA-C 04/16/13 [phone]

## 2013-04-14 LAB — GLUCOSE, SYNOVIAL FLUID: Glucose, Synovial Fluid: 110 mg/dL

## 2013-04-17 LAB — BODY FLUID CULTURE

## 2013-04-17 NOTE — ED Provider Notes (Addendum)
Medical screening examination/treatment/procedure(s) were conducted as a shared visit with non-physician practitioner(s) and myself.  I personally evaluated the patient during the encounter.  I interviewed and examined the patient. Will perform arthrocentesis of knee, mostly for therapeutic benefits, but will send diagnostic studies as pt is older and has hx of DM.   Wbc <5,000 and low suspicion of septic joint as pt continues to appear well, is afebrile. Will rec close f/u.     Junius Argyle, MD 04/17/13 1926  Junius Argyle, MD 04/22/13 [phone]

## 2013-04-19 ENCOUNTER — Encounter (HOSPITAL_COMMUNITY): Payer: Self-pay | Admitting: *Deleted

## 2013-04-19 DIAGNOSIS — Z79899 Other long term (current) drug therapy: Secondary | ICD-10-CM | POA: Insufficient documentation

## 2013-04-19 DIAGNOSIS — S0510XA Contusion of eyeball and orbital tissues, unspecified eye, initial encounter: Secondary | ICD-10-CM | POA: Insufficient documentation

## 2013-04-19 DIAGNOSIS — IMO0002 Reserved for concepts with insufficient information to code with codable children: Secondary | ICD-10-CM | POA: Insufficient documentation

## 2013-04-19 DIAGNOSIS — Z8679 Personal history of other diseases of the circulatory system: Secondary | ICD-10-CM | POA: Insufficient documentation

## 2013-04-19 DIAGNOSIS — Z87442 Personal history of urinary calculi: Secondary | ICD-10-CM | POA: Insufficient documentation

## 2013-04-19 DIAGNOSIS — Z8589 Personal history of malignant neoplasm of other organs and systems: Secondary | ICD-10-CM | POA: Insufficient documentation

## 2013-04-19 DIAGNOSIS — Z7982 Long term (current) use of aspirin: Secondary | ICD-10-CM | POA: Insufficient documentation

## 2013-04-19 DIAGNOSIS — E119 Type 2 diabetes mellitus without complications: Secondary | ICD-10-CM | POA: Insufficient documentation

## 2013-04-19 DIAGNOSIS — Y929 Unspecified place or not applicable: Secondary | ICD-10-CM | POA: Insufficient documentation

## 2013-04-19 DIAGNOSIS — I10 Essential (primary) hypertension: Secondary | ICD-10-CM | POA: Insufficient documentation

## 2013-04-19 DIAGNOSIS — Y939 Activity, unspecified: Secondary | ICD-10-CM | POA: Insufficient documentation

## 2013-04-19 DIAGNOSIS — E7800 Pure hypercholesterolemia, unspecified: Secondary | ICD-10-CM | POA: Insufficient documentation

## 2013-04-19 DIAGNOSIS — Z8701 Personal history of pneumonia (recurrent): Secondary | ICD-10-CM | POA: Insufficient documentation

## 2013-04-19 NOTE — ED Notes (Signed)
Pt c/o flashing light in right side of eye, with aching pain.

## 2013-04-20 ENCOUNTER — Emergency Department (HOSPITAL_COMMUNITY)
Admission: EM | Admit: 2013-04-20 | Discharge: 2013-04-20 | Disposition: A | Discharge status: Home / Self Care | Payer: Medicare Other | Attending: Emergency Medicine | Admitting: Emergency Medicine

## 2013-04-20 DIAGNOSIS — H5711 Ocular pain, right eye: Secondary | ICD-10-CM

## 2013-04-20 MED ORDER — FLUORESCEIN SODIUM 1 MG OP STRP
1.0000 | ORAL_STRIP | Freq: Once | OPHTHALMIC | Status: AC
  Administered 2013-04-20: 1 via OPHTHALMIC
  Filled 2013-04-20: qty 1

## 2013-04-20 MED ORDER — TETRACAINE HCL 0.5 % OP SOLN
1.0000 [drp] | Freq: Once | OPHTHALMIC | Status: AC
  Administered 2013-04-20: 1 [drp] via OPHTHALMIC
  Filled 2013-04-20: qty 2

## 2013-04-20 MED ORDER — HYDROCODONE-ACETAMINOPHEN 5-325 MG PO TABS
1.0000 | ORAL_TABLET | Freq: Once | ORAL | Status: AC
  Administered 2013-04-20: 1 via ORAL
  Filled 2013-04-20: qty 1

## 2013-04-20 NOTE — ED Provider Notes (Signed)
CSN: [id number]     Arrival date & time 04/19/13  2137 History     First MD Initiated Contact with Patient 04/20/13 0209     Chief Complaint  Patient presents with  . Eye Problem   (Consider location/radiation/quality/duration/timing/severity/associated sxs/prior Treatment) Patient is a 79 y.o. male presenting with eye problem.  Eye Problem Location:  R eye Quality:  Aching Severity:  Severe Onset quality:  Gradual Duration:  1 week Timing:  Constant Progression:  Worsening Chronicity:  New Context comment:  A rope hit him in the eye Relieved by:  Nothing Worsened by:  Eye movement Ineffective treatments:  None tried Associated symptoms: no blurred vision, no decreased vision, no nausea and no vomiting   Associated symptoms comment:  Light flashes to eye   Past Medical History  Diagnosis Date  . Pneumonia   . Lymphoma     s/p partial gastrectomy  . Hypertension   . Hypercholesteremia   . Diabetes mellitus   . Gout   . Irregular heart beat   . Kidney stone    Past Surgical History  Procedure Laterality Date  . Partial gastrectomy  1994    for lymphoma  . Hernia repair    . Joint replacement    . Back surgery    . Esophagogastroduodenoscopy N/A 10/28/2012    Procedure: ESOPHAGOGASTRODUODENOSCOPY (EGD);  Surgeon: Shirley Friar, MD;  Location: [facility];  Service: Endoscopy;  Laterality: N/A;   No family history on file. History  Substance Use Topics  . Smoking status: Never Smoker   . Smokeless tobacco: Never Used  . Alcohol Use: No    Review of Systems  Constitutional: Negative for fever.  HENT: Negative for congestion.   Eyes: Positive for pain. Negative for blurred vision.  Respiratory: Negative for cough and shortness of breath.   Cardiovascular: Negative for chest pain.  Gastrointestinal: Negative for nausea, vomiting, abdominal pain and diarrhea.  All other systems reviewed and are negative.    Allergies  Review of patient's allergies  indicates no known allergies.  Home Medications   Current Outpatient Rx  Name  Route  Sig  Dispense  Refill  . acetaminophen (TYLENOL) 325 MG tablet   Oral   Take 325 mg by mouth every 6 (six) hours as needed for pain.          Marland Kitchen albuterol (PROVENTIL HFA;VENTOLIN HFA) 108 (90 BASE) MCG/ACT inhaler   Inhalation   Inhale 2 puffs into the lungs every 6 (six) hours as needed for wheezing or shortness of breath.          Marland Kitchen amLODipine (NORVASC) 5 MG tablet   Oral   Take 1 tablet (5 mg total) by mouth daily.   30 tablet   1   . Artificial Tear Ointment OINT   Ophthalmic   Apply 1 application to eye at bedtime.          Marland Kitchen aspirin EC 81 MG tablet   Oral   Take 81 mg by mouth daily.         . bisacodyl (DULCOLAX) 5 MG EC tablet   Oral   Take 5 mg by mouth daily as needed for constipation.          . carvedilol (COREG) 12.5 MG tablet   Oral   Take 12.5 mg by mouth 2 (two) times daily with a meal.          . Cyanocobalamin (VITAMIN B-12 PO)   Oral   Take  1 tablet by mouth every other day.         . cyclobenzaprine (FLEXERIL) 10 MG tablet   Oral   Take 10 mg by mouth 3 (three) times daily as needed for muscle spasms.          . cycloSPORINE (RESTASIS) 0.05 % ophthalmic emulsion   Both Eyes   Place 1 drop into both eyes 2 (two) times daily.         Marland Kitchen gabapentin (NEURONTIN) 100 MG capsule   Oral   Take 100 mg by mouth 2 (two) times daily.         Marland Kitchen glipiZIDE (GLUCOTROL) 10 MG tablet   Oral   Take 10 mg by mouth daily.           Marland Kitchen HYDROcodone-acetaminophen (NORCO/VICODIN) 5-325 MG per tablet   Oral   Take 1 tablet by mouth every 4 (four) hours as needed for pain. For pain         . hydroxypropyl methylcellulose (ISOPTO TEARS) 2.5 % ophthalmic solution   Both Eyes   Place 1 drop into both eyes 3 (three) times daily as needed (dry eyes).          . isosorbide-hydrALAZINE (BIDIL) 20-37.5 MG per tablet   Oral   Take 1 tablet by mouth every 8  (eight) hours.   90 tablet   1   . lidocaine (LMX) 4 % cream   Topical   Apply 1 application topically as needed (via atomizer).         . niacin 500 MG tablet   Oral   Take 1,000 mg by mouth at bedtime.          Marland Kitchen omeprazole (PRILOSEC) 20 MG capsule   Oral   Take 20 mg by mouth 2 (two) times daily as needed (acid reflux).          . polyethylene glycol (MIRALAX / GLYCOLAX) packet   Oral   Take 17 g by mouth daily as needed (constipation). For constipation         . potassium chloride SA (K-DUR,KLOR-CON) 20 MEQ tablet   Oral   Take 1 tablet (20 mEq total) by mouth daily.   30 tablet   1   . sertraline (ZOLOFT) 100 MG tablet   Oral   Take 200 mg by mouth at bedtime.          . simvastatin (ZOCOR) 20 MG tablet   Oral   Take 0.5 tablets (10 mg total) by mouth at bedtime.   30 tablet   1   . torsemide (DEMADEX) 20 MG tablet   Oral   Take 40 mg by mouth daily.          BP 129/63  Pulse 71  Temp(Src) 98.4 F (36.9 C) (Oral)  Resp 18  SpO2 100% Physical Exam  Nursing note and vitals reviewed. Constitutional: He is oriented to person, place, and time. He appears well-developed and well-nourished. No distress.  HENT:  Head: Normocephalic and atraumatic.  Eyes: Conjunctivae, EOM and lids are normal. Pupils are equal, round, and reactive to light. Right eye exhibits no chemosis and no discharge. No foreign body present in the right eye. Left eye exhibits no chemosis and no discharge. No foreign body present in the left eye. No scleral icterus.  Slit lamp exam:      The right eye shows no corneal abrasion, no corneal ulcer, no hyphema, no hypopyon and no fluorescein uptake.  The left eye shows no hyphema and no hypopyon.  Right eye pressures - 9, 10, 9 Left eye pressures - 16, 16, 13  Neck: Neck supple.  Cardiovascular: Normal rate and intact distal pulses.   Pulmonary/Chest: Effort normal. No stridor. No respiratory distress.  Abdominal: Normal  appearance. He exhibits no distension.  Neurological: He is alert and oriented to person, place, and time.  Skin: Skin is warm and dry. No rash noted.  Psychiatric: He has a normal mood and affect. His behavior is normal.    ED Course   Procedures (including critical care time)  Labs Reviewed - No data to display No results found. 1. Eye pain, right     MDM  79 yo male with right eye pain for 1 week after hitting himself in the eye with a rubber strap.  He is a poor, vague historian.  He has no periorbital swelling or edema.  VA poor at baseline.  Normal IOP.  No abrasions. No photophobia.  Etiology of eye pain is unclear, so have recommended close ophthalmology follow for further evaluation.  Pain improved with norco and tetracaine.  Pt in agreement with plan.    Candyce Churn, MD 04/20/13 [phone]

## 2013-04-20 NOTE — ED Notes (Signed)
Pt states that he was exercising with a "rubber rope" around his foot, and it slipped off and hit him in the right eye. The eye is red and is tearing.

## 2013-04-23 ENCOUNTER — Encounter (HOSPITAL_COMMUNITY): Payer: Self-pay | Admitting: Emergency Medicine

## 2013-04-23 ENCOUNTER — Emergency Department (HOSPITAL_COMMUNITY): Payer: Medicare Other

## 2013-04-23 ENCOUNTER — Inpatient Hospital Stay (HOSPITAL_COMMUNITY)
Admission: EM | Admit: 2013-04-23 | Discharge: 2013-04-25 | DRG: 312 | Disposition: A | Discharge status: Home Health | Payer: Medicare Other | Attending: Internal Medicine | Admitting: Internal Medicine

## 2013-04-23 DIAGNOSIS — N189 Chronic kidney disease, unspecified: Secondary | ICD-10-CM

## 2013-04-23 DIAGNOSIS — I509 Heart failure, unspecified: Secondary | ICD-10-CM | POA: Diagnosis present

## 2013-04-23 DIAGNOSIS — I5022 Chronic systolic (congestive) heart failure: Secondary | ICD-10-CM | POA: Diagnosis present

## 2013-04-23 DIAGNOSIS — Z7982 Long term (current) use of aspirin: Secondary | ICD-10-CM

## 2013-04-23 DIAGNOSIS — I959 Hypotension, unspecified: Secondary | ICD-10-CM | POA: Diagnosis present

## 2013-04-23 DIAGNOSIS — R55 Syncope and collapse: Principal | ICD-10-CM | POA: Diagnosis present

## 2013-04-23 DIAGNOSIS — E7800 Pure hypercholesterolemia, unspecified: Secondary | ICD-10-CM | POA: Diagnosis present

## 2013-04-23 DIAGNOSIS — N179 Acute kidney failure, unspecified: Secondary | ICD-10-CM | POA: Diagnosis present

## 2013-04-23 DIAGNOSIS — M1990 Unspecified osteoarthritis, unspecified site: Secondary | ICD-10-CM | POA: Diagnosis present

## 2013-04-23 DIAGNOSIS — E860 Dehydration: Secondary | ICD-10-CM | POA: Diagnosis present

## 2013-04-23 DIAGNOSIS — I10 Essential (primary) hypertension: Secondary | ICD-10-CM | POA: Diagnosis present

## 2013-04-23 DIAGNOSIS — N183 Chronic kidney disease, stage 3 unspecified: Secondary | ICD-10-CM | POA: Diagnosis present

## 2013-04-23 DIAGNOSIS — M1710 Unilateral primary osteoarthritis, unspecified knee: Secondary | ICD-10-CM | POA: Diagnosis present

## 2013-04-23 DIAGNOSIS — E876 Hypokalemia: Secondary | ICD-10-CM | POA: Diagnosis present

## 2013-04-23 DIAGNOSIS — Z87898 Personal history of other specified conditions: Secondary | ICD-10-CM

## 2013-04-23 DIAGNOSIS — E119 Type 2 diabetes mellitus without complications: Secondary | ICD-10-CM | POA: Diagnosis present

## 2013-04-23 DIAGNOSIS — Z79899 Other long term (current) drug therapy: Secondary | ICD-10-CM

## 2013-04-23 DIAGNOSIS — I129 Hypertensive chronic kidney disease with stage 1 through stage 4 chronic kidney disease, or unspecified chronic kidney disease: Secondary | ICD-10-CM | POA: Diagnosis present

## 2013-04-23 LAB — POCT I-STAT, CHEM 8
Glucose, Bld: 179 mg/dL — ABNORMAL HIGH (ref 70–99)
HCT: 38 % — ABNORMAL LOW (ref 39.0–52.0)
Hemoglobin: 12.9 g/dL — ABNORMAL LOW (ref 13.0–17.0)
Potassium: 2.9 mEq/L — ABNORMAL LOW (ref 3.5–5.1)

## 2013-04-23 LAB — URINALYSIS, ROUTINE W REFLEX MICROSCOPIC
Bilirubin Urine: NEGATIVE
Leukocytes, UA: NEGATIVE
Nitrite: NEGATIVE
Specific Gravity, Urine: 1.016 (ref 1.005–1.030)
Urobilinogen, UA: 0.2 mg/dL (ref 0.0–1.0)

## 2013-04-23 LAB — URINE MICROSCOPIC-ADD ON

## 2013-04-23 LAB — GLUCOSE, CAPILLARY
Glucose-Capillary: 180 mg/dL — ABNORMAL HIGH (ref 70–99)
Glucose-Capillary: 131 mg/dL — ABNORMAL HIGH (ref 70–99)

## 2013-04-23 MED ORDER — ONDANSETRON HCL 4 MG PO TABS: 4.0000 mg | ORAL_TABLET | Freq: Four times a day (QID) | ORAL | Status: DC | PRN

## 2013-04-23 MED ORDER — SIMVASTATIN 10 MG PO TABS
10.0000 mg | ORAL_TABLET | Freq: Every day | ORAL | Status: DC
  Administered 2013-04-23 – 2013-04-24 (×2): 10 mg via ORAL
  Filled 2013-04-23 (×3): qty 1

## 2013-04-23 MED ORDER — ACETAMINOPHEN 325 MG PO TABS
650.0000 mg | ORAL_TABLET | Freq: Four times a day (QID) | ORAL | Status: DC | PRN
  Administered 2013-04-23 – 2013-04-24 (×3): 650 mg via ORAL
  Filled 2013-04-23 (×3): qty 2

## 2013-04-23 MED ORDER — MORPHINE SULFATE 2 MG/ML IJ SOLN
1.0000 mg | INTRAMUSCULAR | Status: DC | PRN
  Administered 2013-04-24 – 2013-04-25 (×3): 1 mg via INTRAVENOUS
  Filled 2013-04-23 (×3): qty 1

## 2013-04-23 MED ORDER — HYPROMELLOSE (GONIOSCOPIC) 2.5 % OP SOLN: 1.0000 [drp] | Freq: Three times a day (TID) | OPHTHALMIC | Status: DC | PRN

## 2013-04-23 MED ORDER — ACETAMINOPHEN 650 MG RE SUPP: 650.0000 mg | Freq: Four times a day (QID) | RECTAL | Status: DC | PRN

## 2013-04-23 MED ORDER — HYDROCODONE-ACETAMINOPHEN 5-325 MG PO TABS
1.0000 | ORAL_TABLET | ORAL | Status: DC | PRN
  Administered 2013-04-23 – 2013-04-25 (×6): 2 via ORAL
  Filled 2013-04-23 (×6): qty 2

## 2013-04-23 MED ORDER — ONDANSETRON HCL 4 MG/2ML IJ SOLN: 4.0000 mg | Freq: Four times a day (QID) | INTRAMUSCULAR | Status: DC | PRN

## 2013-04-23 MED ORDER — HEPARIN SODIUM (PORCINE) 5000 UNIT/ML IJ SOLN
5000.0000 [IU] | Freq: Three times a day (TID) | INTRAMUSCULAR | Status: DC
  Administered 2013-04-23 – 2013-04-25 (×5): 5000 [IU] via SUBCUTANEOUS
  Filled 2013-04-23 (×8): qty 1

## 2013-04-23 MED ORDER — ALBUTEROL SULFATE HFA 108 (90 BASE) MCG/ACT IN AERS
2.0000 | INHALATION_SPRAY | Freq: Four times a day (QID) | RESPIRATORY_TRACT | Status: DC | PRN
  Filled 2013-04-23: qty 6.7

## 2013-04-23 MED ORDER — PANTOPRAZOLE SODIUM 40 MG PO TBEC
40.0000 mg | DELAYED_RELEASE_TABLET | Freq: Every day | ORAL | Status: DC
  Administered 2013-04-24 – 2013-04-25 (×2): 40 mg via ORAL
  Filled 2013-04-23 (×2): qty 1

## 2013-04-23 MED ORDER — SODIUM CHLORIDE 0.9 % IJ SOLN
3.0000 mL | Freq: Two times a day (BID) | INTRAMUSCULAR | Status: DC
  Administered 2013-04-23 – 2013-04-25 (×4): 3 mL via INTRAVENOUS

## 2013-04-23 MED ORDER — INSULIN ASPART 100 UNIT/ML ~~LOC~~ SOLN: 0.0000 [IU] | Freq: Three times a day (TID) | SUBCUTANEOUS | Status: DC

## 2013-04-23 MED ORDER — SODIUM CHLORIDE 0.9 % IV SOLN
INTRAVENOUS | Status: AC
  Administered 2013-04-23 – 2013-04-24 (×2): via INTRAVENOUS

## 2013-04-23 MED ORDER — SODIUM CHLORIDE 0.9 % IV BOLUS (SEPSIS)
500.0000 mL | Freq: Once | INTRAVENOUS | Status: AC
  Administered 2013-04-23: 500 mL via INTRAVENOUS

## 2013-04-23 MED ORDER — ASPIRIN EC 81 MG PO TBEC
81.0000 mg | DELAYED_RELEASE_TABLET | Freq: Every day | ORAL | Status: DC
  Administered 2013-04-24 – 2013-04-25 (×2): 81 mg via ORAL
  Filled 2013-04-23 (×3): qty 1

## 2013-04-23 MED ORDER — POTASSIUM CHLORIDE CRYS ER 20 MEQ PO TBCR
40.0000 meq | EXTENDED_RELEASE_TABLET | Freq: Four times a day (QID) | ORAL | Status: AC
  Administered 2013-04-23 – 2013-04-24 (×2): 40 meq via ORAL
  Filled 2013-04-23 (×2): qty 2

## 2013-04-23 MED ORDER — POLYVINYL ALCOHOL 1.4 % OP SOLN
1.0000 [drp] | Freq: Three times a day (TID) | OPHTHALMIC | Status: DC | PRN
  Administered 2013-04-23 – 2013-04-25 (×3): 1 [drp] via OPHTHALMIC
  Filled 2013-04-23: qty 15

## 2013-04-23 NOTE — Progress Notes (Signed)
[facility] History and Physical  [name]:[id number] DOB: 11/23/1931 DOA: 04/23/2013  Referring physician: Jodean Lima, MD Resident PCP: Roy Farber, MD  Specialists:   Chief Complaint: Lightheadedness  HPI: [name] is a 79 y.o. male with past medical history of CHF with ejection fraction of 45-50%, CKD stage III, DM 2 and HTN. Patient present to the hospital with near syncope. Patient said he was in his usual state of health till yesterday, [facility], and he was on Lotrel for blood pressure before. He got a mail-in order for Lotrel and he took it last night. Today he felt very dizzy especially when he was trying to get off of the bed, he feels also numb and generally weak so came in to the hospital for further evaluation. Upon initial evaluation in the ED CT scan of the head showed no acute events, he was found to have low blood pressure with systolic blood pressure in the 70s, this improved after bolus of half liter of normal saline. He was also found to be acute on chronic renal failure and hypokalemia was potassium of 2.9.  Review of Systems: Constitutional: negative for anorexia, fevers and sweats Eyes: negative for irritation, redness and visual disturbance Ears, nose, mouth, throat, and face: negative for earaches, epistaxis, nasal congestion and sore throat Respiratory: negative for cough, dyspnea on exertion, sputum and wheezing Cardiovascular: negative for chest pain, dyspnea, lower extremity edema, orthopnea, palpitations and syncope Gastrointestinal: negative for abdominal pain, constipation, diarrhea, melena, nausea and vomiting Genitourinary:negative for dysuria, frequency and hematuria Hematologic/lymphatic: negative for bleeding, easy bruising and lymphadenopathy Musculoskeletal:negative for arthralgias, muscle weakness and stiff joints Neurological: negative for coordination problems, gait problems,  headaches and weakness Endocrine: negative for diabetic symptoms including polydipsia, polyuria and weight loss Allergic/Immunologic: negative for anaphylaxis, hay fever and urticaria   Past Medical History  Diagnosis Date  . Pneumonia   . Lymphoma     s/p partial gastrectomy  . Hypertension   . Hypercholesteremia   . Diabetes mellitus   . Gout   . Irregular heart beat   . Kidney stone    Past Surgical History  Procedure Laterality Date  . Partial gastrectomy  1994    for lymphoma  . Hernia repair    . Joint replacement    . Back surgery    . Esophagogastroduodenoscopy N/A 10/28/2012    Procedure: ESOPHAGOGASTRODUODENOSCOPY (EGD);  Surgeon: Roy Friar, MD;  Location: [facility];  Service: Endoscopy;  Laterality: N/A;   Social History:  reports that he has never smoked. He has never used smokeless tobacco. He reports that he does not drink alcohol or use illicit drugs.  No Known Allergies  No family history on file.  Prior to Admission medications   Medication Sig Start Date End Date Taking? Authorizing Provider  acetaminophen (TYLENOL) 325 MG tablet Take 325 mg by mouth every 6 (six) hours as needed for pain.    Yes Historical Provider, MD  albuterol (PROVENTIL HFA;VENTOLIN HFA) 108 (90 BASE) MCG/ACT inhaler Inhale 2 puffs into the lungs every 6 (six) hours as needed for wheezing or shortness of breath.    Yes Historical Provider, MD  amLODipine (NORVASC) 5 MG tablet Take 1 tablet (5 mg total) by mouth daily. 11/25/12  Yes Roy Isaiah Blakes, MD  Artificial Tear Ointment OINT Apply 1 application to eye at bedtime.    Yes Historical Provider, MD  aspirin EC 81 MG tablet  Take 81 mg by mouth daily.   Yes Historical Provider, MD  bisacodyl (DULCOLAX) 5 MG EC tablet Take 5 mg by mouth daily as needed for constipation.    Yes Historical Provider, MD  carvedilol (COREG) 12.5 MG tablet Take 12.5 mg by mouth 2 (two) times daily with a meal.    Yes Historical Provider,  MD  Cyanocobalamin (VITAMIN B-12 PO) Take 1 tablet by mouth every other day.   Yes Historical Provider, MD  cyclobenzaprine (FLEXERIL) 10 MG tablet Take 10 mg by mouth 3 (three) times daily as needed for muscle spasms.    Yes Historical Provider, MD  cycloSPORINE (RESTASIS) 0.05 % ophthalmic emulsion Place 1 drop into both eyes 2 (two) times daily.   Yes Historical Provider, MD  gabapentin (NEURONTIN) 100 MG capsule Take 100 mg by mouth 2 (two) times daily.   Yes Historical Provider, MD  glipiZIDE (GLUCOTROL) 10 MG tablet Take 10 mg by mouth daily.     Yes Historical Provider, MD  HYDROcodone-acetaminophen (NORCO/VICODIN) 5-325 MG per tablet Take 1 tablet by mouth every 4 (four) hours as needed for pain. For pain   Yes Historical Provider, MD  hydroxypropyl methylcellulose (ISOPTO TEARS) 2.5 % ophthalmic solution Place 1 drop into both eyes 3 (three) times daily as needed (dry eyes).    Yes Historical Provider, MD  isosorbide-hydrALAZINE (BIDIL) 20-37.5 MG per tablet Take 1 tablet by mouth every 8 (eight) hours. 11/25/12  Yes Roy Cloud, MD  lidocaine (LMX) 4 % cream Apply 1 application topically as needed (via atomizer).   Yes Historical Provider, MD  niacin 500 MG tablet Take 1,000 mg by mouth at bedtime.    Yes Historical Provider, MD  omeprazole (PRILOSEC) 20 MG capsule Take 20 mg by mouth 2 (two) times daily as needed (acid reflux).    Yes Historical Provider, MD  polyethylene glycol (MIRALAX / GLYCOLAX) packet Take 17 g by mouth daily as needed (constipation). For constipation   Yes Historical Provider, MD  potassium chloride SA (K-DUR,KLOR-CON) 20 MEQ tablet Take 1 tablet (20 mEq total) by mouth daily. 01/27/13  Yes Roy Patty, MD  sertraline (ZOLOFT) 100 MG tablet Take 200 mg by mouth at bedtime.    Yes Historical Provider, MD  simvastatin (ZOCOR) 20 MG tablet Take 0.5 tablets (10 mg total) by mouth at bedtime. 04/12/13  Yes Roy Patty, MD  torsemide (DEMADEX) 20  MG tablet Take 40 mg by mouth daily.   Yes Historical Provider, MD   Physical Exam: Filed Vitals:   04/23/13 1715  BP: 104/62  Pulse: 60  Temp:   Resp: 11  General appearance: alert, cooperative and no distress , slurred speech and generally difficult to understand Head: Normocephalic, without obvious abnormality, atraumatic  Eyes: conjunctivae/corneas clear. PERRL, EOM's intact. Fundi benign.  Nose: Nares normal. Septum midline. Mucosa normal. No drainage or sinus tenderness.  Throat: lips, mucosa, and tongue normal; teeth and gums normal  Neck: Supple, no masses, no cervical lymphadenopathy, no JVD appreciated, no meningeal signs Resp: clear to auscultation bilaterally  Chest wall: no tenderness  Cardio: regular rate and rhythm, S1, S2 normal, no murmur, click, rub or gallop  GI: soft, non-tender; bowel sounds normal; no masses, no organomegaly  Extremities: Right knee in a cast, patient said he has severe arthritis. Skin: Skin color, texture, turgor normal. No rashes or lesions  Neurologic: Alert and oriented X 3, normal strength and tone. Normal symmetric reflexes. Gait was not tested  Labs on  Admission:  Basic Metabolic Panel:  Recent Labs Lab 04/23/13 1532  NA 141  K 2.9*  CL 104  GLUCOSE 179*  BUN 37*  CREATININE 3.00*   Liver Function Tests: No results found for this basename: AST, ALT, ALKPHOS, BILITOT, PROT, ALBUMIN,  in the last 168 hours No results found for this basename: LIPASE, AMYLASE,  in the last 168 hours No results found for this basename: AMMONIA,  in the last 168 hours CBC:  Recent Labs Lab 04/23/13 1532  HGB 12.9*  HCT 38.0*   Cardiac Enzymes: No results found for this basename: CKTOTAL, CKMB, CKMBINDEX, TROPONINI,  in the last 168 hours  BNP (last 3 results)  Recent Labs  11/12/12 1033 11/14/12 0510 11/28/12 1530  PROBNP 4088.0* 4368.0* 1456.0*   CBG:  Recent Labs Lab 04/23/13 1550  GLUCAP 180*    Radiological Exams on  Admission: Ct Head Wo Contrast  04/23/2013   *RADIOLOGY REPORT*  Clinical Data: Dizziness with near syncope.  CT HEAD WITHOUT CONTRAST  Technique:  Contiguous axial images were obtained from the base of the skull through the vertex without contrast.  Comparison: 01/30/2012.  Findings: There is no evidence for acute infarction, intracranial hemorrhage, mass lesion, hydrocephalus, or extra-axial fluid. Moderate age related atrophy.  Chronic microvascular ischemic change. Dural calcification versus old Pantopaque along the basilar cisterns.  Calvarium intact.  Vascular calcification.  No acute sinus disease.  Cerumen fills both external canals.  No mastoid inflammatory process.  Previous scalp hematoma has resolved, otherwise no interval change from 2013.  IMPRESSION: No acute intracranial findings.  Atrophy and chronic microvascular ischemic change.   Original Report Authenticated By: Davonna Belling, M.D.   Dg Chest Portable 1 View  04/23/2013   *RADIOLOGY REPORT*  Clinical Data: Near syncope.  PORTABLE CHEST - 1 VIEW  Comparison: Two-view chest x-ray 04/13/2013, 11/22/2011.  Findings: Cardiac silhouette normal and mediastinal contours unremarkable for the AP portable technique.  Lungs clear. Pulmonary vascularity normal.  Bronchovascular markings normal.  No pneumothorax.  No pleural effusions.  Right shoulder arthroplasty with anatomic alignment.  IMPRESSION: No acute cardiopulmonary disease.   Original Report Authenticated By: Hulan Saas, M.D.    EKG: Independently reviewed.   Assessment/Plan Principal Problem:   Near syncope Active Problems:   HTN (hypertension)   Diabetes mellitus   Arthritis   CKD (chronic kidney disease) stage 3, GFR 30-59 ml/min   CHF (congestive heart failure)   Acute on chronic renal failure   Hypokalemia   Near syncope -This is likely secondary to dehydration and volume depletion, patient was still taking Demadex. -Patient is on Norco and gabapentin. These  medications renally excreted and might cause dizziness with ARF. -Patient also taking multiple blood pressure medications including amlodipine, Coreg, Demadex and Bidil. -Have had a low blood pressure on admission, this was resolved by bolus of IV fluids. -Hold blood pressure medications, hold diuretics. -There is subtle T-wave abnormalities, we will cycle 3 sets of troponin and repeat EKG in a.m. -Hydrate gently with IV fluids, check orthostatic vitals in a.m.  Acute on chronic renal failure -Patient baseline creatinine is 1.9 about 10 days ago. Creatinine is 3.0 today. -Likely secondary to dehydration, volume depletion from diuretics and antihypertensives. -Hold nephrotoxic medications, blood pressure medications and Demadex hydrate gently with IV fluids. -Check BMP in a.m.   Hypokalemia -Replete with oral potassium, check BMP in a.m.  Dysarthria -Patient is difficult to understand, he said he always been like this. I did not find any records  mentioning dysarthria. -Because of the presentation with weakness and dizziness I will obtain MRI of the brain.  CHF -Has history of chronic systolic CHF, ejection fraction around 47%. -His CHF medications were held including his Demadex and started on IV fluids. -We will discontinue IV fluids in a.m., follow his fluid status closely.  Diabetes mellitus type 2 -Check hemoglobin A1c, patient is on Glucotrol, hold while he is having renal failure. -Utilize a sliding scale and carbohydrate modified diet , when in the hospital.  I think there is question about medical compliance on this patient, his being in the ED 9 times in the past 6 months. Patient has CHF and he might benefit from CHF visiting nurse at home.  Code Status: Full code Family Communication: Plan discussed with the patient Disposition Plan: Telemetry, inpatient, especially with sitting greater than 2 midnights.  Time spent: 70 minutes  [facility] Pager [phone]  If 7PM-7AM, please contact night-coverage [email or web address] Password [facility] 04/23/2013, 6:05 PM

## 2013-04-23 NOTE — ED Notes (Signed)
Admitting MD at bedside. Reporting patient can eat.

## 2013-04-23 NOTE — ED Notes (Signed)
Pt reports while sitting on the bed he was taking his medications when he experienced a near syncopal episode. Pt with low BP in triage 76/45. Pt reports generalized body weakness. Pt with diaphoresis. Pt with slurred speech. Weak grip to left hand.

## 2013-04-23 NOTE — ED Provider Notes (Signed)
CSN: [id number]     Arrival date & time 04/23/13  1454 History     First MD Initiated Contact with Patient 04/23/13 [phone]     Chief Complaint  Patient presents with  . Near Syncope   (Consider location/radiation/quality/duration/timing/severity/associated sxs/prior Treatment) HPI Comments: 79 y.o. M with a PMH of CHF with EF 45%, CKD, DM, HTN presenting with near syncopal episode.  Pt states he took a blood pressure pill that he was recently taken off of.  Following this, he went from laying down to sitting and then felt sudden lightheadedness and generalized weakness.  Symptoms persisted so pt presented to ED.  He states he did not pass out.  He denies HA, vision change, or focal weakness or numbness although states he felt generalized weakness/numbness after sitting up.  Denies chest pain, dyspnea, N/V.  Pt found to be hypotensive to 76/45 in triage and pt diaphoretic appearing.  Patient is a 79 y.o. male presenting with weakness. The history is provided by the patient.  Weakness This is a new problem. The current episode started today. The problem occurs constantly. The problem has been unchanged. Associated symptoms include diaphoresis, fatigue, numbness (generalized) and weakness (generalized). Pertinent negatives include no abdominal pain, chest pain, chills, coughing, fever, headaches, nausea, neck pain, rash, urinary symptoms, vertigo or vomiting. Exacerbated by: sitting up from lying down. He has tried nothing for the symptoms. The treatment provided no relief.    Past Medical History  Diagnosis Date  . Pneumonia   . Lymphoma     s/p partial gastrectomy  . Hypertension   . Hypercholesteremia   . Diabetes mellitus   . Gout   . Irregular heart beat   . Kidney stone    Past Surgical History  Procedure Laterality Date  . Partial gastrectomy  1994    for lymphoma  . Hernia repair    . Joint replacement    . Back surgery    . Esophagogastroduodenoscopy N/A 10/28/2012   Procedure: ESOPHAGOGASTRODUODENOSCOPY (EGD);  Surgeon: Shirley Friar, MD;  Location: [facility];  Service: Endoscopy;  Laterality: N/A;   No family history on file. History  Substance Use Topics  . Smoking status: Never Smoker   . Smokeless tobacco: Never Used  . Alcohol Use: No    Review of Systems  Constitutional: Positive for diaphoresis and fatigue. Negative for fever, chills and appetite change.  HENT: Negative for neck pain and neck stiffness.   Eyes: Negative for photophobia and visual disturbance.  Respiratory: Negative for cough, chest tightness, shortness of breath and wheezing.   Cardiovascular: Negative for chest pain, palpitations and leg swelling.  Gastrointestinal: Negative for nausea, vomiting, abdominal pain and abdominal distention.  Genitourinary: Negative for dysuria.  Musculoskeletal: Negative for back pain and gait problem.  Skin: Negative for rash.  Neurological: Positive for weakness (generalized), light-headedness and numbness (generalized). Negative for vertigo, syncope, facial asymmetry, speech difficulty and headaches.  All other systems reviewed and are negative.    Allergies  Review of patient's allergies indicates no known allergies.  Home Medications   No current outpatient prescriptions on file. BP 111/68  Pulse 72  Temp(Src) 97.7 F (36.5 C) (Oral)  Resp 16  Ht 6\' 2"  (1.88 m)  Wt 220 lb 10.9 oz (100.1 kg)  BMI 28.32 kg/m2  SpO2 100% Physical Exam  Nursing note and vitals reviewed. Constitutional: He is oriented to person, place, and time. He appears well-developed and well-nourished. No distress.  HENT:  Head: Normocephalic and atraumatic.  Eyes: EOM are normal. Pupils are equal, round, and reactive to light.  Neck: Normal range of motion. Neck supple.  Cardiovascular: Normal rate, regular rhythm, normal heart sounds and intact distal pulses.   Pulmonary/Chest: Effort normal and breath sounds normal. No respiratory distress. He  has no wheezes. He has no rales.  Abdominal: Soft. Bowel sounds are normal. He exhibits no distension. There is no tenderness. There is no rigidity, no rebound and no guarding. A hernia is present.  Musculoskeletal: Normal range of motion. He exhibits no edema and no tenderness.  Neurological: He is alert and oriented to person, place, and time. He has normal strength. No cranial nerve deficit or sensory deficit. He exhibits normal muscle tone. He displays a negative Romberg sign. Coordination normal. GCS eye subscore is 4. GCS verbal subscore is 5. GCS motor subscore is 6.  Skin: Skin is warm and dry. No rash noted. He is not diaphoretic.    ED Course   Procedures (including critical care time)  Labs Reviewed  URINALYSIS, ROUTINE W REFLEX MICROSCOPIC - Abnormal; Notable for the following:    Protein, ur 30 (*)    All other components within normal limits  GLUCOSE, CAPILLARY - Abnormal; Notable for the following:    Glucose-Capillary 180 (*)    All other components within normal limits  GLUCOSE, CAPILLARY - Abnormal; Notable for the following:    Glucose-Capillary 131 (*)    All other components within normal limits  URINE MICROSCOPIC-ADD ON - Abnormal; Notable for the following:    Squamous Epithelial / LPF FEW (*)    Casts HYALINE CASTS (*)    All other components within normal limits  GLUCOSE, CAPILLARY - Abnormal; Notable for the following:    Glucose-Capillary 132 (*)    All other components within normal limits  POCT I-STAT, CHEM 8 - Abnormal; Notable for the following:    Potassium 2.9 (*)    BUN 37 (*)    Creatinine, Ser 3.00 (*)    Glucose, Bld 179 (*)    Hemoglobin 12.9 (*)    HCT 38.0 (*)    All other components within normal limits  TROPONIN I  MAGNESIUM  TSH  HEMOGLOBIN A1C  TROPONIN I  TROPONIN I  BASIC METABOLIC PANEL  CBC  POCT I-STAT TROPONIN I   Ct Head Wo Contrast  04/23/2013   *RADIOLOGY REPORT*  Clinical Data: Dizziness with near syncope.  CT HEAD  WITHOUT CONTRAST  Technique:  Contiguous axial images were obtained from the base of the skull through the vertex without contrast.  Comparison: 01/30/2012.  Findings: There is no evidence for acute infarction, intracranial hemorrhage, mass lesion, hydrocephalus, or extra-axial fluid. Moderate age related atrophy.  Chronic microvascular ischemic change. Dural calcification versus old Pantopaque along the basilar cisterns.  Calvarium intact.  Vascular calcification.  No acute sinus disease.  Cerumen fills both external canals.  No mastoid inflammatory process.  Previous scalp hematoma has resolved, otherwise no interval change from 2013.  IMPRESSION: No acute intracranial findings.  Atrophy and chronic microvascular ischemic change.   Original Report Authenticated By: Davonna Belling, M.D.   Dg Chest Portable 1 View  04/23/2013   *RADIOLOGY REPORT*  Clinical Data: Near syncope.  PORTABLE CHEST - 1 VIEW  Comparison: Two-view chest x-ray 04/13/2013, 11/22/2011.  Findings: Cardiac silhouette normal and mediastinal contours unremarkable for the AP portable technique.  Lungs clear. Pulmonary vascularity normal.  Bronchovascular markings normal.  No pneumothorax.  No pleural effusions.  Right shoulder arthroplasty with  anatomic alignment.  IMPRESSION: No acute cardiopulmonary disease.   Original Report Authenticated By: Hulan Saas, M.D.   1. Hypotension   2. AKI (acute kidney injury)   3. Hypokalemia   4. Acute on chronic renal failure   5. Diabetes mellitus      Date: 04/23/2013  Rate: 73  Rhythm: normal sinus rhythm  QRS Axis: normal  Intervals: normal  ST/T Wave abnormalities: ST depressions laterally  Conduction Disutrbances:first-degree A-V block   Narrative Interpretation:   Old EKG Reviewed: changes noted   MDM  79 y.o. M with a PMH of CHF with EF 45%, CKD, DM, HTN presenting with near syncopal episode, feeling lightheaded after taking a BP medication he was recently taken off of and then  going from lying to sitting position.  Pt found to be hypotensive to 76/45 and diaphoretic in triage.  EKG obtained shows new ST depressions in lateral leads.  No active chest pain or dyspnea.  Neuro exam with no focal deficits.  Some slurring of speech which pt states is chronic.  Given EKG changes will workup for ACS.  Also possible CVA or infectious etiology, or reaction to medication.  CT head ordered. BP improved to 96/55 when repeated.  Troponin, CXR, and CT head unremarkable.  Labs notable for AKI with Cr of 3.0 as well as hypokalemia.  Given presyncope, hypotension, EKG changes, and AKI, will admit pt for further workup.  Discussed with attending Dr. Micheline Maze.     Jodean Lima, MD 04/24/13 [phone]

## 2013-04-23 NOTE — ED Notes (Signed)
Pt's CBG is 180 mg/dl.RN Maralyn Sago notified.

## 2013-04-23 NOTE — ED Notes (Signed)
Pt states he wasn't feeling well at home today. States he was sitting on bed and he felt like he was going pass out, but did not. Also states "I think my blood is low." slurred speech noted, L side grip weak. He is alert and answering questions appropriately at the time. States he feels weak and hurts all over his body.

## 2013-04-24 ENCOUNTER — Inpatient Hospital Stay (HOSPITAL_COMMUNITY): Payer: Medicare Other

## 2013-04-24 ENCOUNTER — Other Ambulatory Visit: Payer: Self-pay

## 2013-04-24 DIAGNOSIS — R55 Syncope and collapse: Principal | ICD-10-CM

## 2013-04-24 LAB — BASIC METABOLIC PANEL
BUN: 40 mg/dL — ABNORMAL HIGH (ref 6–23)
CO2: 21 mEq/L (ref 19–32)
Chloride: 103 mEq/L (ref 96–112)
Creatinine, Ser: 2.36 mg/dL — ABNORMAL HIGH (ref 0.50–1.35)
Glucose, Bld: 84 mg/dL (ref 70–99)

## 2013-04-24 LAB — CBC
HCT: 31.9 % — ABNORMAL LOW (ref 39.0–52.0)
MCHC: 34.8 g/dL (ref 30.0–36.0)
MCV: 88.6 fL (ref 78.0–100.0)
RDW: 14.1 % (ref 11.5–15.5)
WBC: 7.1 10*3/uL (ref 4.0–10.5)

## 2013-04-24 LAB — HEMOGLOBIN A1C: Hgb A1c MFr Bld: 6.2 % — ABNORMAL HIGH (ref <5.7)

## 2013-04-24 LAB — GLUCOSE, CAPILLARY
Glucose-Capillary: 66 mg/dL — ABNORMAL LOW (ref 70–99)
Glucose-Capillary: 96 mg/dL (ref 70–99)
Glucose-Capillary: 87 mg/dL (ref 70–99)
Glucose-Capillary: 109 mg/dL — ABNORMAL HIGH (ref 70–99)
Glucose-Capillary: 113 mg/dL — ABNORMAL HIGH (ref 70–99)

## 2013-04-24 LAB — TSH: TSH: 1.532 u[IU]/mL (ref 0.350–4.500)

## 2013-04-24 MED ORDER — POTASSIUM CHLORIDE CRYS ER 20 MEQ PO TBCR
40.0000 meq | EXTENDED_RELEASE_TABLET | Freq: Four times a day (QID) | ORAL | Status: AC
  Administered 2013-04-24 (×2): 40 meq via ORAL
  Filled 2013-04-24 (×2): qty 2

## 2013-04-24 NOTE — Progress Notes (Signed)
[facility] PROGRESS NOTE  [name] ZOX:[id number] DOB: 1932/08/14 DOA: 04/23/2013 PCP: Eino Farber, MD  HPI/Subjective: Feels better, and denies any dizziness or "numbness"  Assessment/Plan:  Near syncope  -This is likely secondary to dehydration and volume depletion, patient was still taking Demadex.  -Patient is on Norco and gabapentin. These medications renally excreted and might cause dizziness with ARF.  -Patient also taking multiple blood pressure medications including amlodipine, Coreg, Demadex and Bidil.  -Have had a low blood pressure on admission, this was resolved by bolus of IV fluids.  -Hold blood pressure medications, hold diuretics.  --3 sets of cardiac enzymes. Repeat orthostatic vitals, discontinue IV fluids.  Acute on chronic renal failure  -Patient baseline creatinine is 1.9 about 10 days ago. Creatinine is 3.0 today.  -Likely secondary to dehydration, volume depletion from diuretics and antihypertensives.  -Hold nephrotoxic medications, blood pressure medications and Demadex hydrate gently with IV fluids.  -Check BMP in a.m.   Hypokalemia  -Replete with oral potassium, check BMP in a.m.   Dysarthria  -Patient is difficult to understand, he said he always been like this. I did not find any records mentioning dysarthria.  -MRI of the brain is negative  CHF  -Has history of chronic systolic CHF, ejection fraction around 47%.  -His CHF medications were held including his Demadex and started on IV fluids.  -We will discontinue IV fluids in a.m., follow his fluid status closely.   Diabetes mellitus type 2  -Check hemoglobin A1c, patient is on Glucotrol, hold while he is having renal failure.  -Utilize a sliding scale and carbohydrate modified diet , when in the hospital.   Code Status: Full code Family Communication: Plan discussed with the patient. Disposition Plan: Remains  inpatient   Consultants:  None  Procedures:  None  Antibiotics:   none    Objective: Filed Vitals:   04/24/13 1034  BP: 137/74  Pulse: 62  Temp: 97.5 F (36.4 C)  Resp: 18    Intake/Output Summary (Last 24 hours) at 04/24/13 1235 Last data filed at 04/24/13 1023  Gross per 24 hour  Intake    943 ml  Output    601 ml  Net    342 ml   Filed Weights   04/23/13 1506 04/23/13 1858 04/24/13 0508  Weight: 99.791 kg (220 lb) 100.1 kg (220 lb 10.9 oz) 101.3 kg (223 lb 5.2 oz)    Exam: General: Alert and awake, oriented x3, not in any acute distress. HEENT: anicteric sclera, pupils reactive to light and accommodation, EOMI CVS: S1-S2 clear, no murmur rubs or gallops Chest: clear to auscultation bilaterally, no wheezing, rales or rhonchi Abdomen: soft nontender, nondistended, normal bowel sounds, no organomegaly Extremities: Trace pedal edema. Neuro: Cranial nerves II-XII intact, no focal neurological deficits  Data Reviewed: Basic Metabolic Panel:  Recent Labs Lab 04/23/13 1532 04/23/13 2022 04/24/13 0200  NA 141  --  136  K 2.9*  --  3.4*  CL 104  --  103  CO2  --   --  21  GLUCOSE 179*  --  84  BUN 37*  --  40*  CREATININE 3.00*  --  2.36*  CALCIUM  --   --  8.4  MG  --  1.8  --    Liver Function Tests: No results found for this basename: AST, ALT, ALKPHOS, BILITOT, PROT, ALBUMIN,  in the last 168 hours No results found for this basename: LIPASE, AMYLASE,  in the last 168 hours  No results found for this basename: AMMONIA,  in the last 168 hours CBC:  Recent Labs Lab 04/23/13 1532 04/24/13 0200  WBC  --  7.1  HGB 12.9* 11.1*  HCT 38.0* 31.9*  MCV  --  88.6  PLT  --  218   Cardiac Enzymes:  Recent Labs Lab 04/23/13 2019 04/24/13 0148 04/24/13 0735  TROPONINI <0.30 <0.30 <0.30   BNP (last 3 results)  Recent Labs  11/12/12 1033 11/14/12 0510 11/28/12 1530  PROBNP 4088.0* 4368.0* 1456.0*   CBG:  Recent Labs Lab 04/23/13 1855  04/23/13 2132 04/24/13 0610 04/24/13 0641 04/24/13 1135  GLUCAP 131* 132* 66* 96 87    Micro No results found for this or any previous visit (from the past 240 hour(s)).   Studies: Ct Head Wo Contrast  04/23/2013   *RADIOLOGY REPORT*  Clinical Data: Dizziness with near syncope.  CT HEAD WITHOUT CONTRAST  Technique:  Contiguous axial images were obtained from the base of the skull through the vertex without contrast.  Comparison: 01/30/2012.  Findings: There is no evidence for acute infarction, intracranial hemorrhage, mass lesion, hydrocephalus, or extra-axial fluid. Moderate age related atrophy.  Chronic microvascular ischemic change. Dural calcification versus old [place]op[place] along the basilar cisterns.  Calvarium intact.  Vascular calcification.  No acute sinus disease.  Cerumen fills both external canals.  No mastoid inflammatory process.  Previous scalp hematoma has resolved, otherwise no interval change from 2013.  IMPRESSION: No acute intracranial findings.  Atrophy and chronic microvascular ischemic change.   Original Report Authenticated By: Davonna Belling, M.D.   Mr Brain Wo Contrast  04/24/2013   *RADIOLOGY REPORT*  Clinical Data: Near syncope.  Dizziness.  Generalized weakness. Stroke risk factors include hypertension, diabetes mellitus, and chronic renal failure as well as congestive heart failure.  Also history of lymphoma.  MRI HEAD WITHOUT CONTRAST  Technique:  Multiplanar, multiecho pulse sequences of the brain and surrounding structures were obtained according to standard protocol without intravenous contrast.  Comparison: CT head 04/23/2013.  No prior MR exams of the brain are available.  Findings: The patient had difficulty remaining motionless for the study.  Images are suboptimal.  Small or subtle lesions could be overlooked.  Diffusion weighted imaging shows no areas of restricted diffusion to suggest acute infarction. No hemorrhage, mass lesion, hydrocephalus, or extra-axial  fluid.  Moderate atrophy consistent with the patient's age of 19. Periventricular greater than subcortical white matter signal abnormality also involve the brainstem consistent with chronic microvascular ischemic change from the patient's comorbidities of hypertension and diabetes.  No large vessel infarct or significant lacunar infarctions are evident.  Flow void signal is preserved in the internal carotid arteries and left vertebral artery.  The right vertebral is small and may end in PICA.  The basilar artery appears thick-walled in its midportion suggesting atheromatous change.  This could be evaluated further with MRA intracranial as clinically indicated.  Calvarium is intact.  Partial empty sella.  No skull base lesions. Cervical spondylosis.  There is mild tonsillar ectopia without Chiari I malformation.  Soft tissue density surrounds the odontoid consistent with mild pannus.  There is no history of rheumatoid or psoriatic arthritis, therefore significance uncertain.  Mild chronic sinus disease.  No acute mastoid fluid.  Bilateral cataract extraction has been performed.  IMPRESSION: Motion degraded exam demonstrating no acute stroke or hemorrhage.  Atrophy with chronic microvascular ischemic change.  Equivocal finding of thick-walled basilar could imply posterior circulation compromise.  Consider MRA intracranial when the patient  is able to better cooperate to remain motionless.   Original Report Authenticated By: Davonna Belling, M.D.   Dg Chest Portable 1 View  04/23/2013   *RADIOLOGY REPORT*  Clinical Data: Near syncope.  PORTABLE CHEST - 1 VIEW  Comparison: Two-view chest x-ray 04/13/2013, 11/22/2011.  Findings: Cardiac silhouette normal and mediastinal contours unremarkable for the AP portable technique.  Lungs clear. Pulmonary vascularity normal.  Bronchovascular markings normal.  No pneumothorax.  No pleural effusions.  Right shoulder arthroplasty with anatomic alignment.  IMPRESSION: No acute  cardiopulmonary disease.   Original Report Authenticated By: Hulan Saas, M.D.    Scheduled Meds: . aspirin EC  81 mg Oral Daily  . heparin  5,000 Units Subcutaneous Q8H  . insulin aspart  0-9 Units Subcutaneous TID WC  . pantoprazole  40 mg Oral Daily  . potassium chloride  40 mEq Oral Q6H  . simvastatin  10 mg Oral QHS  . sodium chloride  3 mL Intravenous Q12H   Continuous Infusions: . sodium chloride 75 mL/hr at 04/24/13 1023    Principal Problem:   Near syncope Active Problems:   HTN (hypertension)   Diabetes mellitus   Arthritis   CKD (chronic kidney disease) stage 3, GFR 30-59 ml/min   CHF (congestive heart failure)   Acute on chronic renal failure   Hypokalemia    Time spent: 35 minutes    [facility]  [facility] Pager [phone] If 7PM-7AM, please contact night-coverage at [email or web address], password [facility] 04/24/2013, 12:35 PM  LOS: 1 day

## 2013-04-24 NOTE — Progress Notes (Signed)
Pt had CBG of 66, gave 8oz of Sprite and grahams and PB rechecked now 96, will continue to monitor, Thanks, Lavonda Jumbo RN

## 2013-04-24 NOTE — ED Provider Notes (Signed)
Medical screening examination/treatment/procedure(s) were conducted as a shared visit with resident-physician practitioner(s) and myself.  I personally evaluated the patient during the encounter.  Pt is a 79 y.o. male with pmhx as above presenting with near syncopal episode, hypotension in setting of taking a antihypertensive last night that he was recently taken off.  Pt found to have ST depression lateral leads, but denies CP.  Cardiopulm exam benign.  No focal neuro findings. BP improved with IVF.  Cr elevated at 3.  Pt admitted to medical service.     Shanna Cisco, MD 04/24/13 1059

## 2013-04-25 LAB — BASIC METABOLIC PANEL
Calcium: 8.8 mg/dL (ref 8.4–10.5)
Creatinine, Ser: 1.54 mg/dL — ABNORMAL HIGH (ref 0.50–1.35)
GFR calc non Af Amer: 41 mL/min — ABNORMAL LOW (ref >90)
Glucose, Bld: 79 mg/dL (ref 70–99)
Sodium: 137 mEq/L (ref 135–145)

## 2013-04-25 LAB — CBC
MCH: 29.9 pg (ref 26.0–34.0)
MCHC: 33.1 g/dL (ref 30.0–36.0)
MCV: 90.1 fL (ref 78.0–100.0)
Platelets: 224 10*3/uL (ref 150–400)
RBC: 3.75 MIL/uL — ABNORMAL LOW (ref 4.22–5.81)
RDW: 14.5 % (ref 11.5–15.5)

## 2013-04-25 LAB — GLUCOSE, CAPILLARY: Glucose-Capillary: 81 mg/dL (ref 70–99)

## 2013-04-25 MED ORDER — FUROSEMIDE 10 MG/ML IJ SOLN
40.0000 mg | Freq: Once | INTRAMUSCULAR | Status: AC
  Administered 2013-04-25: 40 mg via INTRAVENOUS
  Filled 2013-04-25: qty 4

## 2013-04-25 NOTE — Progress Notes (Signed)
Reviewed all discharge instructions and f/u appt with Dr Gala Romney [facility]nd [facility]. Pt stated understanding.  No voiced complaints.  No new medications upon discharge so no prescriptions to give.

## 2013-04-25 NOTE — Discharge Summary (Signed)
Physician Discharge Summary  [name]:[id number] DOB: 1931/12/23 DOA: 04/23/2013  PCP: Eino Farber, MD  Admit date: 04/23/2013 Discharge date: 04/25/2013  Time spent: 40 minutes  Recommendations for Outpatient Follow-up:  1. Followup with heart failure clinic on 05/03/2013  Discharge Diagnoses:  Principal Problem:   Near syncope Active Problems:   HTN (hypertension)   Diabetes mellitus   Arthritis   CKD (chronic kidney disease) stage 3, GFR 30-59 ml/min   CHF (congestive heart failure)   Acute on chronic renal failure   Hypokalemia   Discharge Condition: Stable  Diet recommendation: Heart healthy diet  Filed Weights   04/23/13 1858 04/24/13 0508 04/25/13 0538  Weight: 100.1 kg (220 lb 10.9 oz) 101.3 kg (223 lb 5.2 oz) 102.8 kg (226 lb 10.1 oz)    History of present illness:  [name] is a 79 y.o. male with past medical history of CHF with ejection fraction of 45-50%, CKD stage III, DM 2 and HTN. Patient present to the hospital with near syncope. Patient said he was in his usual state of health till yesterday, [facility], and he was on Lotrel for blood pressure before. He got a mail-in order for Lotrel and he took it last night. Today he felt very dizzy especially when he was trying to get off of the bed, he feels also numb and generally weak so came in to the hospital for further evaluation. Upon initial evaluation in the ED CT scan of the head showed no acute events, he was found to have low blood pressure with systolic blood pressure in the 70s, this improved after bolus of half liter of normal saline. He was also found to be acute on chronic renal failure and hypokalemia was potassium of 2.9.  Hospital Course:   1. Near-syncope: This is likely was secondary to dehydration or volume depletion, exacerbated by his medication including antihypertensive medication, Norco and gabapentin. Patient blood pressure was  documented low in the emergency department with systolic blood pressure in the 60s, this was observed after bolus of IV fluids. His blood pressure medication were held and his diuretic were held at the admission. Patient reported that he got Lotrel as mail order and he took it, he knows that his physician asked him not to take it. Please note that acute coronary syndrome was ruled out by 3 negative sets of cardiac enzymes, patient is back to normal.  2. Acute on chronic renal failure: Patient baseline creatinine about 1.9 about 10 days ago, he was presented with a creatinine of 3.0. Likely secondary to dehydration and hypotension. Nephrotoxic medications were held, including antihypertensive medications and Demadex and patient hydrate gently with IV fluids, his renal failure resolved with  Creatinine of 1.5 prior to discharge. Patient was noted to have 6 pound weight gain so IV Lasix were given before discharge.  3. Hypokalemia: Patient didn't have low potassium likely secondary to aggressive diuresis this was repleted aggressively with oral supplements, patient presented with potassium of 2.9, on discharge potassium is 4.6.  4. Dysarthria: ED staff reported that patient was difficult to understand, patient said he has always been like this, because presentation with near syncope and questionable dysarthria MRI of the brain was obtained and showed no acute events.  5. Chronic systolic CHF: With ejection fraction of 45-50%, at the time of admission his CHF medications were held because of near syncope, medications restarted prior to discharge.  6. Diabetes mellitus type  2: Hemoglobin A1c is 6.2, this is correlate with mean plasma glucose of 131. This indicates tight glycemic control and control DM2.  7. Questionable compliance: Patient into the hospital in the past 6 months for 9 times, patient said his primary care physician is 50 miles away from where he lives (in [place], [place]) with the [place]  [facility]. Patient also seems to have very poor understanding his disease process. Home health services including PT/OT and a nurse was ordered to help him manage his CHF and ambulation problems.  8. Arthritis: Severe arthritis especially the right knee with mild knee effusion, patient is on narcotic pain medications, he was asking about knee aspiration and steroid injection, instructed to discard that was his primary care physician.  Procedures:  None  Consultations:  Heart failure team  Discharge Exam: Filed Vitals:   04/25/13 1048  BP: 126/74  Pulse: 60  Temp: 98 F (36.7 C)  Resp: 16   General: Alert and awake, oriented x3, not in any acute distress. HEENT: anicteric sclera, pupils reactive to light and accommodation, EOMI CVS: S1-S2 clear, no murmur rubs or gallops Chest: clear to auscultation bilaterally, no wheezing, rales or rhonchi Abdomen: soft nontender, nondistended, normal bowel sounds, no organomegaly Extremities: Right knee effusion, +1 pedal edema per Neuro: Cranial nerves II-XII intact, no focal neurological deficits  Discharge Instructions  Discharge Orders   Future Appointments Provider Department Dept Phone   05/05/2013 9:30 AM [facility] [phone]   05/23/2013 10:40 AM [facility] [phone]   Future Orders Complete By Expires   Diet - low sodium heart healthy  As directed    Increase activity slowly  As directed        Medication List         acetaminophen 325 MG tablet  Commonly known as:  TYLENOL  Take 325 mg by mouth every 6 (six) hours as needed for pain.     albuterol 108 (90 BASE) MCG/ACT inhaler  Commonly known as:  PROVENTIL HFA;VENTOLIN HFA  Inhale 2 puffs into the lungs every 6 (six) hours as needed for wheezing or shortness of breath.     amLODipine 5 MG tablet  Commonly known as:  NORVASC  Take 1 tablet (5 mg  total) by mouth daily.     Artificial Tear Ointment Oint  Apply 1 application to eye at bedtime.     aspirin EC 81 MG tablet  Take 81 mg by mouth daily.     bisacodyl 5 MG EC tablet  Commonly known as:  DULCOLAX  Take 5 mg by mouth daily as needed for constipation.     carvedilol 12.5 MG tablet  Commonly known as:  COREG  Take 12.5 mg by mouth 2 (two) times daily with a meal.     cyclobenzaprine 10 MG tablet  Commonly known as:  FLEXERIL  Take 10 mg by mouth 3 (three) times daily as needed for muscle spasms.     cycloSPORINE 0.05 % ophthalmic emulsion  Commonly known as:  RESTASIS  Place 1 drop into both eyes 2 (two) times daily.     gabapentin 100 MG capsule  Commonly known as:  NEURONTIN  Take 100 mg by mouth 2 (two) times daily.     glipiZIDE 10 MG tablet  Commonly known as:  GLUCOTROL  Take 10 mg by mouth daily.     HYDROcodone-acetaminophen 5-325 MG per tablet  Commonly known as:  NORCO/VICODIN  Take 1 tablet by mouth every 4 (four) hours as needed for pain. For pain     hydroxypropyl methylcellulose 2.5 % ophthalmic solution  Commonly known as:  ISOPTO TEARS  Place 1 drop into both eyes 3 (three) times daily as needed (dry eyes).     isosorbide-hydrALAZINE 20-37.5 MG per tablet  Commonly known as:  BIDIL  Take 1 tablet by mouth every 8 (eight) hours.     lidocaine 4 % cream  Commonly known as:  LMX  Apply 1 application topically as needed (via atomizer).     niacin 500 MG tablet  Take 1,000 mg by mouth at bedtime.     omeprazole 20 MG capsule  Commonly known as:  PRILOSEC  Take 20 mg by mouth 2 (two) times daily as needed (acid reflux).     polyethylene glycol packet  Commonly known as:  MIRALAX / GLYCOLAX  Take 17 g by mouth daily as needed (constipation). For constipation     potassium chloride SA 20 MEQ tablet  Commonly known as:  K-DUR,KLOR-CON  Take 1 tablet (20 mEq total) by mouth daily.     sertraline 100 MG tablet  Commonly known as:   ZOLOFT  Take 200 mg by mouth at bedtime.     simvastatin 20 MG tablet  Commonly known as:  ZOCOR  Take 0.5 tablets (10 mg total) by mouth at bedtime.     torsemide 20 MG tablet  Commonly known as:  DEMADEX  Take 40 mg by mouth daily.     VITAMIN B-12 PO  Take 1 tablet by mouth every other day.       No Known Allergies     Follow-up Information   Follow up with Arvilla Meres, MD On 05/03/2013. (at 9:30)    Specialty:  Cardiology   Contact information:   [address] [place] [address] [phone]        The results of significant diagnostics from this hospitalization (including imaging, microbiology, ancillary and laboratory) are listed below for reference.    Significant Diagnostic Studies: Dg Chest 2 View  04/13/2013   *RADIOLOGY REPORT*  Clinical Data: Cough, knee pain and swelling  CHEST - 2 VIEW  Comparison: 11/12/2012  Findings: Cardiomegaly again noted.  No acute infiltrate or pleural effusion.  No pulmonary edema.  Degenerative changes thoracic spine again noted.  Stable right shoulder prosthesis.  IMPRESSION: No active disease.  Cardiomegaly again noted.  Degenerative changes thoracic spine.   Original Report Authenticated By: Natasha Mead, M.D.   Ct Head Wo Contrast  04/23/2013   *RADIOLOGY REPORT*  Clinical Data: Dizziness with near syncope.  CT HEAD WITHOUT CONTRAST  Technique:  Contiguous axial images were obtained from the base of the skull through the vertex without contrast.  Comparison: 01/30/2012.  Findings: There is no evidence for acute infarction, intracranial hemorrhage, mass lesion, hydrocephalus, or extra-axial fluid. Moderate age related atrophy.  Chronic microvascular ischemic change. Dural calcification versus old [place] along the basilar cisterns.  Calvarium intact.  Vascular calcification.  No acute sinus disease.  Cerumen fills both external canals.  No mastoid inflammatory process.  Previous scalp hematoma has resolved,  otherwise no interval change from 2013.  IMPRESSION: No acute intracranial findings.  Atrophy and chronic microvascular ischemic change.   Original Report Authenticated By: Davonna Belling, M.D.   Mr Brain Wo Contrast  04/24/2013   *RADIOLOGY REPORT*  Clinical Data: Near syncope.  Dizziness.  Generalized  weakness. Stroke risk factors include hypertension, diabetes mellitus, and chronic renal failure as well as congestive heart failure.  Also history of lymphoma.  MRI HEAD WITHOUT CONTRAST  Technique:  Multiplanar, multiecho pulse sequences of the brain and surrounding structures were obtained according to standard protocol without intravenous contrast.  Comparison: CT head 04/23/2013.  No prior MR exams of the brain are available.  Findings: The patient had difficulty remaining motionless for the study.  Images are suboptimal.  Small or subtle lesions could be overlooked.  Diffusion weighted imaging shows no areas of restricted diffusion to suggest acute infarction. No hemorrhage, mass lesion, hydrocephalus, or extra-axial fluid.  Moderate atrophy consistent with the patient's age of 49. Periventricular greater than subcortical white matter signal abnormality also involve the brainstem consistent with chronic microvascular ischemic change from the patient's comorbidities of hypertension and diabetes.  No large vessel infarct or significant lacunar infarctions are evident.  Flow void signal is preserved in the internal carotid arteries and left vertebral artery.  The right vertebral is small and may end in PICA.  The basilar artery appears thick-walled in its midportion suggesting atheromatous change.  This could be evaluated further with MRA intracranial as clinically indicated.  Calvarium is intact.  Partial empty sella.  No skull base lesions. Cervical spondylosis.  There is mild tonsillar ectopia without Chiari I malformation.  Soft tissue density surrounds the odontoid consistent with mild pannus.  There is no  history of rheumatoid or psoriatic arthritis, therefore significance uncertain.  Mild chronic sinus disease.  No acute mastoid fluid.  Bilateral cataract extraction has been performed.  IMPRESSION: Motion degraded exam demonstrating no acute stroke or hemorrhage.  Atrophy with chronic microvascular ischemic change.  Equivocal finding of thick-walled basilar could imply posterior circulation compromise.  Consider MRA intracranial when the patient is able to better cooperate to remain motionless.   Original Report Authenticated By: Davonna Belling, M.D.   Dg Chest Portable 1 View  04/23/2013   *RADIOLOGY REPORT*  Clinical Data: Near syncope.  PORTABLE CHEST - 1 VIEW  Comparison: Two-view chest x-ray 04/13/2013, 11/22/2011.  Findings: Cardiac silhouette normal and mediastinal contours unremarkable for the AP portable technique.  Lungs clear. Pulmonary vascularity normal.  Bronchovascular markings normal.  No pneumothorax.  No pleural effusions.  Right shoulder arthroplasty with anatomic alignment.  IMPRESSION: No acute cardiopulmonary disease.   Original Report Authenticated By: Hulan Saas, M.D.   Dg Knee Complete 4 Views Right  04/13/2013   *RADIOLOGY REPORT*  Clinical Data: Knee pain, swelling  RIGHT KNEE - COMPLETE 4+ VIEW  Comparison: 11/12/2012  Findings: Four views of the right knee submitted.  No displaced fracture or subluxation.  Again noted significant osteoarthritic changes medial joint compartment.  There is progression of degenerative changes with sclerosis of medial tibial plateau. There is a cortical irregularity and progression of the sclerotic changes in the medial femoral condyle.  There is a vague lucency in the medial aspect of the articular surface of the medial femoral condyle.  I cannot exclude a osteochondral defect.  There is progression of the spurring of medial femoral condyle and medial tibial plateau.  There are some dystrophic calcification just adjacent to medial joint.  Mild  fragmentation of the osteophytes cannot be excluded.  Clinical correlation is necessary.  Further evaluation with MRI is recommended as clinically warranted. Significant narrowing of patellofemoral joint space.  Small joint effusion.  Spurring of patella.  IMPRESSION: No displaced fracture or subluxation.  Again noted significant osteoarthritic changes medial joint  compartment.  There is progression of degenerative changes with sclerosis of medial tibial plateau.  There is a cortical irregularity and progression of the sclerotic changes in the medial femoral condyle.  There is a vague lucency in the medial aspect of the articular surface of the medial femoral condyle.  I cannot exclude a osteochondral defect.  There is progression of the spurring of medial femoral condyle and medial tibial plateau.  There are some dystrophic calcification just adjacent to medial joint.  Mild fragmentation of the osteophytes cannot be excluded.  Clinical correlation is necessary.  Further evaluation with MRI is recommended as clinically warranted. Significant narrowing of patellofemoral joint space.  Small joint effusion.  Spurring of patella.   Original Report Authenticated By: Natasha Mead, M.D.    Microbiology: No results found for this or any previous visit (from the past 240 hour(s)).   Labs: Basic Metabolic Panel:  Recent Labs Lab 04/23/13 1532 04/23/13 2022 04/24/13 0200 04/25/13 0545  NA 141  --  136 137  K 2.9*  --  3.4* 4.6  CL 104  --  103 106  CO2  --   --  21 20  GLUCOSE 179*  --  84 79  BUN 37*  --  40* 38*  CREATININE 3.00*  --  2.36* 1.54*  CALCIUM  --   --  8.4 8.8  MG  --  1.8  --   --    Liver Function Tests: No results found for this basename: AST, ALT, ALKPHOS, BILITOT, PROT, ALBUMIN,  in the last 168 hours No results found for this basename: LIPASE, AMYLASE,  in the last 168 hours No results found for this basename: AMMONIA,  in the last 168 hours CBC:  Recent Labs Lab 04/23/13 1532  04/24/13 0200 04/25/13 0545  WBC  --  7.1 6.4  HGB 12.9* 11.1* 11.2*  HCT 38.0* 31.9* 33.8*  MCV  --  88.6 90.1  PLT  --  218 224   Cardiac Enzymes:  Recent Labs Lab 04/23/13 2019 04/24/13 0148 04/24/13 0735  TROPONINI <0.30 <0.30 <0.30   BNP: BNP (last 3 results)  Recent Labs  11/12/12 1033 11/14/12 0510 11/28/12 1530  PROBNP 4088.0* 4368.0* 1456.0*   CBG:  Recent Labs Lab 04/24/13 1135 04/24/13 1605 04/24/13 2118 04/25/13 0602 04/25/13 1139  GLUCAP 87 109* 113* 81 110*       Signed:  Kaisyn Millea A  [facility] 04/25/2013, 1:07 PM

## 2013-04-25 NOTE — Care Management Note (Signed)
    Page 1 of 1   04/25/2013     2:12:40 PM   CARE MANAGEMENT NOTE 04/25/2013  Patient:  [name], [name]   Account Number:  [id number]  Date Initiated:  04/25/2013  Documentation initiated by:  Tera Mater  Subjective/Objective Assessment:   79yo male admitted with Acute Kidney Injury.  Pt. lives at home alone.     Action/Plan:   discharge planning   Anticipated DC Date:  04/25/2013   Anticipated DC Plan:  HOME W HOME HEALTH SERVICES      DC Planning Services  CM consult      Choice offered to / List presented to:  C-1 Patient        HH arranged  HH-2 PT  HH-3 OT  HH-1 RN      [facility] agency  [facility]   Status of service:  In process, will continue to follow Medicare Important Message given?   (If response is "NO", the following Medicare IM given date fields will be blank) Date Medicare IM given:   Date Additional Medicare IM given:    Discharge Disposition:  HOME W HOME HEALTH SERVICES  Per UR Regulation:  Reviewed for med. necessity/level of care/duration of stay  If discussed at Long Length of Stay Meetings, dates discussed:    Comments:  04/25/13 1400 In to speak with pt. about home health services.  Pt. states he has had [facility] in past and was satisfied with their care.  TC to Debbie, with [facility], to give referral for [facility] PT/OT/RN.  Pt. to dc home today. Tera Mater, RN, BSN NCM [phone]

## 2013-04-25 NOTE — H&P (Signed)
[facility] History and Physical  GLEASON ARDOIN RUE:[id number] DOB: [birth year] DOA: 04/23/2013  Referring physician: Jodean Lima, MD Resident PCP: Eino Farber, MD  Specialists:   Chief Complaint: Lightheadedness  HPI: [name] is a 79 y.o. male with past medical history of CHF with ejection fraction of 45-50%, CKD stage III, DM 2 and HTN. Patient present to the hospital with near syncope. Patient said he was in his usual state of health till yesterday, [facility], and he was on Lotrel for blood pressure before. He got a mail-in order for Lotrel and he took it last night. Today he felt very dizzy especially when he was trying to get off of the bed, he feels also numb and generally weak so came in to the hospital for further evaluation. Upon initial evaluation in the ED CT scan of the head showed no acute events, he was found to have low blood pressure with systolic blood pressure in the 70s, this improved after bolus of half liter of normal saline. He was also found to be acute on chronic renal failure and hypokalemia was potassium of 2.9.  Review of Systems: Constitutional: negative for anorexia, fevers and sweats Eyes: negative for irritation, redness and visual disturbance Ears, nose, mouth, throat, and face: negative for earaches, epistaxis, nasal congestion and sore throat Respiratory: negative for cough, dyspnea on exertion, sputum and wheezing Cardiovascular: negative for chest pain, dyspnea, lower extremity edema, orthopnea, palpitations and syncope Gastrointestinal: negative for abdominal pain, constipation, diarrhea, melena, nausea and vomiting Genitourinary:negative for dysuria, frequency and hematuria Hematologic/lymphatic: negative for bleeding, easy bruising and lymphadenopathy Musculoskeletal:negative for arthralgias, muscle weakness and stiff joints Neurological: negative for coordination problems, gait problems,  headaches and weakness Endocrine: negative for diabetic symptoms including polydipsia, polyuria and weight loss Allergic/Immunologic: negative for anaphylaxis, hay fever and urticaria   Past Medical History  Diagnosis Date  . Pneumonia   . Lymphoma     s/p partial gastrectomy  . Hypertension   . Hypercholesteremia   . Diabetes mellitus   . Gout   . Irregular heart beat   . Kidney stone    Past Surgical History  Procedure Laterality Date  . Partial gastrectomy  1994    for lymphoma  . Hernia repair    . Joint replacement    . Back surgery    . Esophagogastroduodenoscopy N/A 10/28/2012    Procedure: ESOPHAGOGASTRODUODENOSCOPY (EGD);  Surgeon: Shirley Friar, MD;  Location: [facility];  Service: Endoscopy;  Laterality: N/A;   Social History:  reports that he has never smoked. He has never used smokeless tobacco. He reports that he does not drink alcohol or use illicit drugs.  No Known Allergies  No family history on file.  Prior to Admission medications   Medication Sig Start Date End Date Taking? Authorizing Provider  acetaminophen (TYLENOL) 325 MG tablet Take 325 mg by mouth every 6 (six) hours as needed for pain.    Yes Historical Provider, MD  albuterol (PROVENTIL HFA;VENTOLIN HFA) 108 (90 BASE) MCG/ACT inhaler Inhale 2 puffs into the lungs every 6 (six) hours as needed for wheezing or shortness of breath.    Yes Historical Provider, MD  amLODipine (NORVASC) 5 MG tablet Take 1 tablet (5 mg total) by mouth daily. 11/25/12  Yes Estela Isaiah Blakes, MD  Artificial Tear Ointment OINT Apply 1 application to eye at bedtime.    Yes Historical Provider, MD  aspirin EC 81 MG tablet  Take 81 mg by mouth daily.   Yes Historical Provider, MD  bisacodyl (DULCOLAX) 5 MG EC tablet Take 5 mg by mouth daily as needed for constipation.    Yes Historical Provider, MD  carvedilol (COREG) 12.5 MG tablet Take 12.5 mg by mouth 2 (two) times daily with a meal.    Yes Historical Provider,  MD  Cyanocobalamin (VITAMIN B-12 PO) Take 1 tablet by mouth every other day.   Yes Historical Provider, MD  cyclobenzaprine (FLEXERIL) 10 MG tablet Take 10 mg by mouth 3 (three) times daily as needed for muscle spasms.    Yes Historical Provider, MD  cycloSPORINE (RESTASIS) 0.05 % ophthalmic emulsion Place 1 drop into both eyes 2 (two) times daily.   Yes Historical Provider, MD  gabapentin (NEURONTIN) 100 MG capsule Take 100 mg by mouth 2 (two) times daily.   Yes Historical Provider, MD  glipiZIDE (GLUCOTROL) 10 MG tablet Take 10 mg by mouth daily.     Yes Historical Provider, MD  HYDROcodone-acetaminophen (NORCO/VICODIN) 5-325 MG per tablet Take 1 tablet by mouth every 4 (four) hours as needed for pain. For pain   Yes Historical Provider, MD  hydroxypropyl methylcellulose (ISOPTO TEARS) 2.5 % ophthalmic solution Place 1 drop into both eyes 3 (three) times daily as needed (dry eyes).    Yes Historical Provider, MD  isosorbide-hydrALAZINE (BIDIL) 20-37.5 MG per tablet Take 1 tablet by mouth every 8 (eight) hours. 11/25/12  Yes Henderson Cloud, MD  lidocaine (LMX) 4 % cream Apply 1 application topically as needed (via atomizer).   Yes Historical Provider, MD  niacin 500 MG tablet Take 1,000 mg by mouth at bedtime.    Yes Historical Provider, MD  omeprazole (PRILOSEC) 20 MG capsule Take 20 mg by mouth 2 (two) times daily as needed (acid reflux).    Yes Historical Provider, MD  polyethylene glycol (MIRALAX / GLYCOLAX) packet Take 17 g by mouth daily as needed (constipation). For constipation   Yes Historical Provider, MD  potassium chloride SA (K-DUR,KLOR-CON) 20 MEQ tablet Take 1 tablet (20 mEq total) by mouth daily. 01/27/13  Yes Dolores Patty, MD  sertraline (ZOLOFT) 100 MG tablet Take 200 mg by mouth at bedtime.    Yes Historical Provider, MD  simvastatin (ZOCOR) 20 MG tablet Take 0.5 tablets (10 mg total) by mouth at bedtime. 04/12/13  Yes Dolores Patty, MD  torsemide (DEMADEX) 20  MG tablet Take 40 mg by mouth daily.   Yes Historical Provider, MD   Physical Exam: Filed Vitals:   04/25/13 1310  BP: 149/77  Pulse: 64  Temp: 98 F (36.7 C)  Resp: 18  General appearance: alert, cooperative and no distress , slurred speech and generally difficult to understand Head: Normocephalic, without obvious abnormality, atraumatic  Eyes: conjunctivae/corneas clear. PERRL, EOM's intact. Fundi benign.  Nose: Nares normal. Septum midline. Mucosa normal. No drainage or sinus tenderness.  Throat: lips, mucosa, and tongue normal; teeth and gums normal  Neck: Supple, no masses, no cervical lymphadenopathy, no JVD appreciated, no meningeal signs Resp: clear to auscultation bilaterally  Chest wall: no tenderness  Cardio: regular rate and rhythm, S1, S2 normal, no murmur, click, rub or gallop  GI: soft, non-tender; bowel sounds normal; no masses, no organomegaly  Extremities: Right knee in a cast, patient said he has severe arthritis. Skin: Skin color, texture, turgor normal. No rashes or lesions  Neurologic: Alert and oriented X 3, normal strength and tone. Normal symmetric reflexes. Gait was not tested  Labs on Admission:  Basic Metabolic Panel:  Recent Labs Lab 04/23/13 1532 04/23/13 2022 04/24/13 0200 04/25/13 0545  NA 141  --  136 137  K 2.9*  --  3.4* 4.6  CL 104  --  103 106  CO2  --   --  21 20  GLUCOSE 179*  --  84 79  BUN 37*  --  40* 38*  CREATININE 3.00*  --  2.36* 1.54*  CALCIUM  --   --  8.4 8.8  MG  --  1.8  --   --    Liver Function Tests: No results found for this basename: AST, ALT, ALKPHOS, BILITOT, PROT, ALBUMIN,  in the last 168 hours No results found for this basename: LIPASE, AMYLASE,  in the last 168 hours No results found for this basename: AMMONIA,  in the last 168 hours CBC:  Recent Labs Lab 04/23/13 1532 04/24/13 0200 04/25/13 0545  WBC  --  7.1 6.4  HGB 12.9* 11.1* 11.2*  HCT 38.0* 31.9* 33.8*  MCV  --  88.6 90.1  PLT  --  218 224    Cardiac Enzymes:  Recent Labs Lab 04/23/13 2019 04/24/13 0148 04/24/13 0735  TROPONINI <0.30 <0.30 <0.30    BNP (last 3 results)  Recent Labs  11/12/12 1033 11/14/12 0510 11/28/12 1530  PROBNP 4088.0* 4368.0* 1456.0*   CBG:  Recent Labs Lab 04/24/13 1135 04/24/13 1605 04/24/13 2118 04/25/13 0602 04/25/13 1139  GLUCAP 87 109* 113* 81 110*    Radiological Exams on Admission: Ct Head Wo Contrast  04/23/2013   *RADIOLOGY REPORT*  Clinical Data: Dizziness with near syncope.  CT HEAD WITHOUT CONTRAST  Technique:  Contiguous axial images were obtained from the base of the skull through the vertex without contrast.  Comparison: 01/30/2012.  Findings: There is no evidence for acute infarction, intracranial hemorrhage, mass lesion, hydrocephalus, or extra-axial fluid. Moderate age related atrophy.  Chronic microvascular ischemic change. Dural calcification versus old [place] along the basilar cisterns.  Calvarium intact.  Vascular calcification.  No acute sinus disease.  Cerumen fills both external canals.  No mastoid inflammatory process.  Previous scalp hematoma has resolved, otherwise no interval change from 2013.  IMPRESSION: No acute intracranial findings.  Atrophy and chronic microvascular ischemic change.   Original Report Authenticated By: Davonna Belling, M.D.   Mr Brain Wo Contrast  04/24/2013   *RADIOLOGY REPORT*  Clinical Data: Near syncope.  Dizziness.  Generalized weakness. Stroke risk factors include hypertension, diabetes mellitus, and chronic renal failure as well as congestive heart failure.  Also history of lymphoma.  MRI HEAD WITHOUT CONTRAST  Technique:  Multiplanar, multiecho pulse sequences of the brain and surrounding structures were obtained according to standard protocol without intravenous contrast.  Comparison: CT head 04/23/2013.  No prior MR exams of the brain are available.  Findings: The patient had difficulty remaining motionless for the study.  Images  are suboptimal.  Small or subtle lesions could be overlooked.  Diffusion weighted imaging shows no areas of restricted diffusion to suggest acute infarction. No hemorrhage, mass lesion, hydrocephalus, or extra-axial fluid.  Moderate atrophy consistent with the patient's age of 24. Periventricular greater than subcortical white matter signal abnormality also involve the brainstem consistent with chronic microvascular ischemic change from the patient's comorbidities of hypertension and diabetes.  No large vessel infarct or significant lacunar infarctions are evident.  Flow void signal is preserved in the internal carotid arteries and left vertebral artery.  The right vertebral is small and  may end in PICA.  The basilar artery appears thick-walled in its midportion suggesting atheromatous change.  This could be evaluated further with MRA intracranial as clinically indicated.  Calvarium is intact.  Partial empty sella.  No skull base lesions. Cervical spondylosis.  There is mild tonsillar ectopia without Chiari I malformation.  Soft tissue density surrounds the odontoid consistent with mild pannus.  There is no history of rheumatoid or psoriatic arthritis, therefore significance uncertain.  Mild chronic sinus disease.  No acute mastoid fluid.  Bilateral cataract extraction has been performed.  IMPRESSION: Motion degraded exam demonstrating no acute stroke or hemorrhage.  Atrophy with chronic microvascular ischemic change.  Equivocal finding of thick-walled basilar could imply posterior circulation compromise.  Consider MRA intracranial when the patient is able to better cooperate to remain motionless.   Original Report Authenticated By: Davonna Belling, M.D.   Dg Chest Portable 1 View  04/23/2013   *RADIOLOGY REPORT*  Clinical Data: Near syncope.  PORTABLE CHEST - 1 VIEW  Comparison: Two-view chest x-ray 04/13/2013, 11/22/2011.  Findings: Cardiac silhouette normal and mediastinal contours unremarkable for the AP portable  technique.  Lungs clear. Pulmonary vascularity normal.  Bronchovascular markings normal.  No pneumothorax.  No pleural effusions.  Right shoulder arthroplasty with anatomic alignment.  IMPRESSION: No acute cardiopulmonary disease.   Original Report Authenticated By: Hulan Saas, M.D.    EKG: Independently reviewed.   Assessment/Plan Principal Problem:   Near syncope Active Problems:   HTN (hypertension)   Diabetes mellitus   Arthritis   CKD (chronic kidney disease) stage 3, GFR 30-59 ml/min   CHF (congestive heart failure)   Acute on chronic renal failure   Hypokalemia   Near syncope -This is likely secondary to dehydration and volume depletion, patient was still taking Demadex. -Patient is on Norco and gabapentin. These medications renally excreted and might cause dizziness with ARF. -Patient also taking multiple blood pressure medications including amlodipine, Coreg, Demadex and Bidil. -Have had a low blood pressure on admission, this was resolved by bolus of IV fluids. -Hold blood pressure medications, hold diuretics. -There is subtle T-wave abnormalities, we will cycle 3 sets of troponin and repeat EKG in a.m. -Hydrate gently with IV fluids, check orthostatic vitals in a.m.  Acute on chronic renal failure -Patient baseline creatinine is 1.9 about 10 days ago. Creatinine is 3.0 today. -Likely secondary to dehydration, volume depletion from diuretics and antihypertensives. -Hold nephrotoxic medications, blood pressure medications and Demadex hydrate gently with IV fluids. -Check BMP in a.m.   Hypokalemia -Replete with oral potassium, check BMP in a.m.  Dysarthria -Patient is difficult to understand, he said he always been like this. I did not find any records mentioning dysarthria. -Because of the presentation with weakness and dizziness I will obtain MRI of the brain.  CHF -Has history of chronic systolic CHF, ejection fraction around 47%. -His CHF medications were  held including his Demadex and started on IV fluids. -We will discontinue IV fluids in a.m., follow his fluid status closely.  Diabetes mellitus type 2 -Check hemoglobin A1c, patient is on Glucotrol, hold while he is having renal failure. -Utilize a sliding scale and carbohydrate modified diet , when in the hospital.  I think there is question about medical compliance on this patient, his being in the ED 9 times in the past 6 months. Patient has CHF and he might benefit from CHF visiting nurse at home.  Code Status: Full code Family Communication: Plan discussed with the patient Disposition Plan: Telemetry, inpatient,  especially with sitting greater than 2 midnights.  Time spent: 70 minutes  [facility] Pager [phone]  If 7PM-7AM, please contact night-coverage [email or web address] Password [email or web address] 04/25/2013, 1:19 PM

## 2013-04-25 NOTE — Progress Notes (Signed)
Utilization Review Completed.   Terion Hedman, RN, BSN Nurse Case Manager  [phone]  

## 2013-05-05 ENCOUNTER — Encounter (HOSPITAL_COMMUNITY): Payer: Self-pay

## 2013-05-05 ENCOUNTER — Ambulatory Visit (HOSPITAL_COMMUNITY)
Admit: 2013-05-05 | Discharge: 2013-05-05 | Disposition: A | Discharge status: Home / Self Care | Payer: Medicare Other | Attending: Internal Medicine | Admitting: Internal Medicine

## 2013-05-05 VITALS — BP 128/74 | HR 76 | Wt 221.8 lb

## 2013-05-05 DIAGNOSIS — M109 Gout, unspecified: Secondary | ICD-10-CM | POA: Insufficient documentation

## 2013-05-05 DIAGNOSIS — I509 Heart failure, unspecified: Secondary | ICD-10-CM

## 2013-05-05 DIAGNOSIS — Z87442 Personal history of urinary calculi: Secondary | ICD-10-CM | POA: Insufficient documentation

## 2013-05-05 DIAGNOSIS — Z7982 Long term (current) use of aspirin: Secondary | ICD-10-CM | POA: Insufficient documentation

## 2013-05-05 DIAGNOSIS — I50810 Right heart failure, unspecified: Secondary | ICD-10-CM

## 2013-05-05 DIAGNOSIS — I5032 Chronic diastolic (congestive) heart failure: Secondary | ICD-10-CM

## 2013-05-05 DIAGNOSIS — Z79899 Other long term (current) drug therapy: Secondary | ICD-10-CM | POA: Insufficient documentation

## 2013-05-05 DIAGNOSIS — N189 Chronic kidney disease, unspecified: Secondary | ICD-10-CM | POA: Insufficient documentation

## 2013-05-05 DIAGNOSIS — I499 Cardiac arrhythmia, unspecified: Secondary | ICD-10-CM | POA: Insufficient documentation

## 2013-05-05 DIAGNOSIS — E7800 Pure hypercholesterolemia, unspecified: Secondary | ICD-10-CM | POA: Insufficient documentation

## 2013-05-05 DIAGNOSIS — I129 Hypertensive chronic kidney disease with stage 1 through stage 4 chronic kidney disease, or unspecified chronic kidney disease: Secondary | ICD-10-CM | POA: Insufficient documentation

## 2013-05-05 LAB — BASIC METABOLIC PANEL
BUN: 35 mg/dL — ABNORMAL HIGH (ref 6–23)
CO2: 26 mEq/L (ref 19–32)
Chloride: 98 mEq/L (ref 96–112)
Creatinine, Ser: 1.98 mg/dL — ABNORMAL HIGH (ref 0.50–1.35)
Glucose, Bld: 148 mg/dL — ABNORMAL HIGH (ref 70–99)
Potassium: 3.6 mEq/L (ref 3.5–5.1)

## 2013-05-05 NOTE — Progress Notes (Signed)
Patient ID: [name], male   DOB: 10/06/1931, 79 y.o.   MRN: [id number]   Weight Range  243 pounds  Baseline proBNP   PCP: Dr Petra Kuba Actively Followed at [facility]: Dr Sunny Schlein  HPI:  Amous is a 79 yo with history of HTN, CKD, DM,  CHF mostly right-sided. (LVEF 45-50% with mild RV dysfunction)   RHC: 11/16/12  RA mean 19  RV 53/16  PA 56/23, mean 38  PCWP mean 21  Oxygen saturations:  PA 53%  AO 94%  Cardiac Output (Fick) 5.05  Cardiac Index (Fick) 2.36  PVR 3.36 WU   Admitted to [facility] in 3/14 with volume overload. He required Lasix drip and Milrinone. Renal Ultrasound negative for hydronephrosis. Large bilateral renal cysts with normal kidney size. Discharge weight- 243 pounds. Creatinine 2.22   Admitted last week with near syncope. Patient said he was in his usual state of health till yesterday, [facility], and he was on Lotrel for blood pressure before. He got a mail-in order for Lotrel and he took it last night. He felt very dizzy especially when he was trying to get off of the bed. Went to ED.  CT scan of the head showed no acute events, he was found to have low blood pressure with systolic blood pressure in the 70s, this improved after bolus of half liter of normal saline. He was also found to be acute on chronic renal failure  With CR 3.0. He was hydrated and improved. Lotrel changed to Norvasc alone  He returns for post-hospital follow up. Says he is doing great. Doing yard work. No further dizziness.  Denies SOB/PND/Orthopnea/edema. Weighs himself every day but says weight at home is 200. (It is 221 here which is stable) . He is actively followed at the [facility] in [place] with PCP, cardiology and PCP. Saw them recently and said he was doing well.  Compliant with medications. [facility] following. Continues with R knee pain    ROS: All systems negative except as listed in HPI, PMH and Problem List.  Past Medical History  Diagnosis Date   . Pneumonia   . Lymphoma     s/p partial gastrectomy  . Hypertension   . Hypercholesteremia   . Diabetes mellitus   . Gout   . Irregular heart beat   . Kidney stone     Current Outpatient Prescriptions  Medication Sig Dispense Refill  . acetaminophen (TYLENOL) 325 MG tablet Take 325 mg by mouth every 6 (six) hours as needed for pain.       Marland Kitchen albuterol (PROVENTIL HFA;VENTOLIN HFA) 108 (90 BASE) MCG/ACT inhaler Inhale 2 puffs into the lungs every 6 (six) hours as needed for wheezing or shortness of breath.       Marland Kitchen amLODipine (NORVASC) 5 MG tablet Take 1 tablet (5 mg total) by mouth daily.  30 tablet  1  . Artificial Tear Ointment OINT Apply 1 application to eye at bedtime.       Marland Kitchen aspirin EC 81 MG tablet Take 81 mg by mouth daily.      . bisacodyl (DULCOLAX) 5 MG EC tablet Take 5 mg by mouth daily as needed for constipation.       . carvedilol (COREG) 12.5 MG tablet Take 12.5 mg by mouth 2 (two) times daily with a meal.       . Cyanocobalamin (VITAMIN B-12 PO) Take 1 tablet by mouth every other day.      Marland Kitchen  cyclobenzaprine (FLEXERIL) 10 MG tablet Take 10 mg by mouth 3 (three) times daily as needed for muscle spasms.       . cycloSPORINE (RESTASIS) 0.05 % ophthalmic emulsion Place 1 drop into both eyes 2 (two) times daily.      Marland Kitchen gabapentin (NEURONTIN) 100 MG capsule Take 100 mg by mouth 2 (two) times daily.      Marland Kitchen glipiZIDE (GLUCOTROL) 10 MG tablet Take 10 mg by mouth daily.        Marland Kitchen HYDROcodone-acetaminophen (NORCO/VICODIN) 5-325 MG per tablet Take 1 tablet by mouth every 4 (four) hours as needed for pain. For pain      . hydroxypropyl methylcellulose (ISOPTO TEARS) 2.5 % ophthalmic solution Place 1 drop into both eyes 3 (three) times daily as needed (dry eyes).       . isosorbide-hydrALAZINE (BIDIL) 20-37.5 MG per tablet Take 1 tablet by mouth every 8 (eight) hours.  90 tablet  1  . niacin 500 MG tablet Take 1,000 mg by mouth at bedtime.       Marland Kitchen omeprazole (PRILOSEC) 20 MG capsule Take  20 mg by mouth 2 (two) times daily as needed (acid reflux).       . polyethylene glycol (MIRALAX / GLYCOLAX) packet Take 17 g by mouth daily as needed (constipation). For constipation      . potassium chloride SA (K-DUR,KLOR-CON) 20 MEQ tablet Take 1 tablet (20 mEq total) by mouth daily.  30 tablet  1  . sertraline (ZOLOFT) 100 MG tablet Take 200 mg by mouth at bedtime.       . simvastatin (ZOCOR) 20 MG tablet Take 0.5 tablets (10 mg total) by mouth at bedtime.  30 tablet  1  . torsemide (DEMADEX) 20 MG tablet Take 40 mg by mouth daily.      Marland Kitchen lidocaine (LMX) 4 % cream Apply 1 application topically as needed (via atomizer).       No current facility-administered medications for this encounter.     PHYSICAL EXAM: Filed Vitals:   05/05/13 0953  BP: 128/74  Pulse: 76  Weight: 221 lb 12.8 oz (100.608 kg)  SpO2: 98%    PHYSICAL EXAM  General: Elderly. NAD in wheelchair Neck: JVP 5-6 cm, no thyromegaly or thyroid nodule.  Lungs: Decreased in the bases  CV: Nondisplaced PMI. Heart regular S1/S2, increased P2, no S3/S4, 2/6 systolic murmur LLSB. No carotid bruit.  Abdomen: Nontender. Large scar. +large ventral hernia Neurologic: Alert and oriented x 3. Mild dysarthria Psych: Normal affect.  Extremities: No clubbing or cyanosis. No lower extremity edema.  Drop foot on R with brace on R knee   ASSESSMENT & PLAN:  1) Chronic diastolic HF/RHF    --volume status looks great. Continue current regimen. Will get bloodwork today    --Reinforced need for daily weights and reviewed use of sliding scale diuretics. Ok to hold torsemide if weight down more than 3-4 pounds on any day  2) Chronic renal failure,   --stable. Check labs today  3) HTN   --well controlld. Continue current regimen.  Deston Bilyeu,MD 10:19 AM

## 2013-05-05 NOTE — Patient Instructions (Addendum)
Lab today  We will contact you in 2 months to schedule your next appointment.  

## 2013-05-05 NOTE — Addendum Note (Signed)
Encounter addended by: Noralee Space, RN on: 05/05/2013 10:29 AM<BR>     Documentation filed: Patient Instructions Section, Orders

## 2013-05-23 ENCOUNTER — Encounter (HOSPITAL_COMMUNITY): Payer: Medicare Other

## 2013-06-10 ENCOUNTER — Emergency Department (HOSPITAL_COMMUNITY)
Admission: EM | Admit: 2013-06-10 | Discharge: 2013-06-10 | Disposition: A | Discharge status: Home / Self Care | Payer: Medicare Other | Attending: Emergency Medicine | Admitting: Emergency Medicine

## 2013-06-10 ENCOUNTER — Encounter (HOSPITAL_COMMUNITY): Payer: Self-pay | Admitting: Emergency Medicine

## 2013-06-10 ENCOUNTER — Emergency Department (HOSPITAL_COMMUNITY): Payer: Medicare Other

## 2013-06-10 DIAGNOSIS — E7800 Pure hypercholesterolemia, unspecified: Secondary | ICD-10-CM | POA: Insufficient documentation

## 2013-06-10 DIAGNOSIS — Z87442 Personal history of urinary calculi: Secondary | ICD-10-CM | POA: Insufficient documentation

## 2013-06-10 DIAGNOSIS — I10 Essential (primary) hypertension: Secondary | ICD-10-CM | POA: Insufficient documentation

## 2013-06-10 DIAGNOSIS — M25469 Effusion, unspecified knee: Secondary | ICD-10-CM | POA: Insufficient documentation

## 2013-06-10 DIAGNOSIS — Z8701 Personal history of pneumonia (recurrent): Secondary | ICD-10-CM | POA: Insufficient documentation

## 2013-06-10 DIAGNOSIS — Z79899 Other long term (current) drug therapy: Secondary | ICD-10-CM | POA: Insufficient documentation

## 2013-06-10 DIAGNOSIS — E119 Type 2 diabetes mellitus without complications: Secondary | ICD-10-CM | POA: Insufficient documentation

## 2013-06-10 DIAGNOSIS — Z7982 Long term (current) use of aspirin: Secondary | ICD-10-CM | POA: Insufficient documentation

## 2013-06-10 DIAGNOSIS — M25461 Effusion, right knee: Secondary | ICD-10-CM

## 2013-06-10 LAB — SYNOVIAL CELL COUNT + DIFF, W/ CRYSTALS
Crystals, Fluid: NONE SEEN
Eosinophils-Synovial: 0 % (ref 0–1)
Lymphocytes-Synovial Fld: 3 % (ref 0–20)
Monocyte-Macrophage-Synovial Fluid: 67 % (ref 50–90)
WBC, Synovial: 957 /mm3 — ABNORMAL HIGH (ref 0–200)

## 2013-06-10 LAB — GRAM STAIN

## 2013-06-10 LAB — GLUCOSE, SYNOVIAL FLUID: Glucose, Synovial Fluid: 106 mg/dL

## 2013-06-10 MED ORDER — HYDROCODONE-ACETAMINOPHEN 5-325 MG PO TABS
2.0000 | ORAL_TABLET | Freq: Once | ORAL | Status: AC
  Administered 2013-06-10: 2 via ORAL
  Filled 2013-06-10: qty 2

## 2013-06-10 NOTE — ED Notes (Addendum)
PT states that he was seen at [facility] Failure clinic this am. Sent over to ED by staff to have "fluid drawn off my knee" PT with chronic knee issue. Swelling on right knee. Warmth to back of knee. Negative Homans at triage. PT states that he is unable to walk at this time

## 2013-06-10 NOTE — ED Notes (Signed)
Pt's medication returned to pt with 2nd RN Dahlia Byes to verify

## 2013-06-10 NOTE — ED Provider Notes (Signed)
CSN: [id number]     Arrival date & time 06/10/13  1444 History   First MD Initiated Contact with Patient 06/10/13 1533     Chief Complaint  Patient presents with  . Knee Pain   (Consider location/radiation/quality/duration/timing/severity/associated sxs/prior Treatment) The history is provided by the patient.  [name] is a 79 y.o. male history of pneumonia, lymphoma, gout presenting with right knee swelling. Right knee swelling that is chronic but it getting worse. He was not clear if this is worsening of his gout or not. Denies any fevers or chills. He said he is due for a knee replacement but hasn't gotten it yet. Sent from clinic to get a arthrocentesis.    Past Medical History  Diagnosis Date  . Pneumonia   . Lymphoma     s/p partial gastrectomy  . Hypertension   . Hypercholesteremia   . Diabetes mellitus   . Gout   . Irregular heart beat   . Kidney stone    Past Surgical History  Procedure Laterality Date  . Partial gastrectomy  1994    for lymphoma  . Hernia repair    . Joint replacement    . Back surgery    . Esophagogastroduodenoscopy N/A 10/28/2012    Procedure: ESOPHAGOGASTRODUODENOSCOPY (EGD);  Surgeon: Shirley Friar, MD;  Location: [facility];  Service: Endoscopy;  Laterality: N/A;   History reviewed. No pertinent family history. History  Substance Use Topics  . Smoking status: Never Smoker   . Smokeless tobacco: Never Used  . Alcohol Use: No    Review of Systems  Musculoskeletal:       R knee pain   All other systems reviewed and are negative.    Allergies  Review of patient's allergies indicates no known allergies.  Home Medications   Current Outpatient Rx  Name  Route  Sig  Dispense  Refill  . acetaminophen (TYLENOL) 325 MG tablet   Oral   Take 325 mg by mouth every 6 (six) hours as needed for pain.          Marland Kitchen albuterol (PROVENTIL HFA;VENTOLIN HFA) 108 (90 BASE) MCG/ACT inhaler   Inhalation   Inhale 2 puffs into the lungs  every 6 (six) hours as needed for wheezing or shortness of breath.          Marland Kitchen amLODipine (NORVASC) 5 MG tablet   Oral   Take 1 tablet (5 mg total) by mouth daily.   30 tablet   1   . Artificial Tear Ointment OINT   Ophthalmic   Apply 1 application to eye at bedtime.          Marland Kitchen aspirin EC 81 MG tablet   Oral   Take 81 mg by mouth daily.         . bisacodyl (DULCOLAX) 5 MG EC tablet   Oral   Take 5 mg by mouth daily as needed for constipation.          . carvedilol (COREG) 12.5 MG tablet   Oral   Take 12.5 mg by mouth 2 (two) times daily with a meal.          . Cyanocobalamin (VITAMIN B-12 PO)   Oral   Take 1 tablet by mouth every other day.         . cyclobenzaprine (FLEXERIL) 10 MG tablet   Oral   Take 10 mg by mouth 3 (three) times daily as needed for muscle spasms.          Marland Kitchen  cycloSPORINE (RESTASIS) 0.05 % ophthalmic emulsion   Both Eyes   Place 1 drop into both eyes 2 (two) times daily.         Marland Kitchen gabapentin (NEURONTIN) 100 MG capsule   Oral   Take 100 mg by mouth 2 (two) times daily.         Marland Kitchen glipiZIDE (GLUCOTROL) 10 MG tablet   Oral   Take 10 mg by mouth daily.           Marland Kitchen HYDROcodone-acetaminophen (NORCO/VICODIN) 5-325 MG per tablet   Oral   Take 1 tablet by mouth every 4 (four) hours as needed for pain. For pain         . hydroxypropyl methylcellulose (ISOPTO TEARS) 2.5 % ophthalmic solution   Both Eyes   Place 1 drop into both eyes 3 (three) times daily as needed (dry eyes).          . isosorbide-hydrALAZINE (BIDIL) 20-37.5 MG per tablet   Oral   Take 1 tablet by mouth every 8 (eight) hours.   90 tablet   1   . niacin 500 MG tablet   Oral   Take 1,000 mg by mouth at bedtime.          Marland Kitchen omeprazole (PRILOSEC) 20 MG capsule   Oral   Take 20 mg by mouth 2 (two) times daily as needed (acid reflux).          . polyethylene glycol (MIRALAX / GLYCOLAX) packet   Oral   Take 17 g by mouth daily as needed (constipation). For  constipation         . potassium chloride SA (K-DUR,KLOR-CON) 20 MEQ tablet   Oral   Take 1 tablet (20 mEq total) by mouth daily.   30 tablet   1   . sertraline (ZOLOFT) 100 MG tablet   Oral   Take 200 mg by mouth at bedtime.          . simvastatin (ZOCOR) 20 MG tablet   Oral   Take 0.5 tablets (10 mg total) by mouth at bedtime.   30 tablet   1   . torsemide (DEMADEX) 20 MG tablet   Oral   Take 40 mg by mouth daily.          BP 141/83  Pulse 73  Temp(Src) 98.2 F (36.8 C) (Oral)  Resp 18  Ht 6' 2.5" (1.892 m)  Wt 220 lb (99.791 kg)  BMI 27.88 kg/m2  SpO2 98% Physical Exam  Nursing note and vitals reviewed. Constitutional: He is oriented to person, place, and time. He appears well-developed.  Chronically ill, uncomfortable   HENT:  Head: Normocephalic.  Mouth/Throat: Oropharynx is clear and moist.  Eyes: Pupils are equal, round, and reactive to light.  Neck: Normal range of motion.  Cardiovascular: Normal rate, regular rhythm and normal heart sounds.   Pulmonary/Chest: Effort normal and breath sounds normal. No respiratory distress. He has no wheezes.  Abdominal: Soft. Bowel sounds are normal. He exhibits no distension. There is no tenderness.  Musculoskeletal:  R knee swollen with effusion. Slightly warm, no cellulitis. Nl ROM of knee   Neurological: He is alert and oriented to person, place, and time.  Skin: Skin is warm and dry.  Psychiatric: He has a normal mood and affect. His behavior is normal. Judgment and thought content normal.    ED Course  ARTHOCENTESIS Date/Time: 06/10/2013 4:58 PM Performed by: Richardean Canal Authorized by: Richardean Canal Consent: Verbal consent obtained.  written consent obtained. Risks and benefits: risks, benefits and alternatives were discussed Consent given by: patient Patient understanding: patient states understanding of the procedure being performed Procedure consent: procedure consent matches procedure  scheduled Relevant documents: relevant documents present and verified Test results: test results available and properly labeled Site marked: the operative site was marked Imaging studies: imaging studies available Patient identity confirmed: verbally with patient and arm band Indications: joint swelling  Body area: knee Joint: right knee Local anesthesia used: yes Local anesthetic: lidocaine 2% without epinephrine Anesthetic total: 3 ml Patient sedated: no Preparation: Patient was prepped and draped in the usual sterile fashion. Needle gauge: 18 G Approach: medial Aspirate: serous Aspirate amount: 70 ml Patient tolerance: Patient tolerated the procedure well with no immediate complications.   (including critical care time)   Labs Review Labs Reviewed  SYNOVIAL CELL COUNT + DIFF, W/ CRYSTALS - Abnormal; Notable for the following:    Color, Synovial AMBER (*)    Appearance-Synovial TURBID (*)    WBC, Synovial 957 (*)    Neutrophil, Synovial 30 (*)    All other components within normal limits  BODY FLUID CULTURE  GRAM STAIN  GLUCOSE, SYNOVIAL FLUID  BODY FLUID CRYSTAL   Imaging Review Dg Knee Complete 4 Views Right  06/10/2013   CLINICAL DATA:  Chronic right knee pain with swelling.  EXAM: RIGHT KNEE - COMPLETE 4+ VIEW  COMPARISON:  04/13/2013.  FINDINGS: Varus deformity of the right knee is present. Loose bodies are present along the medial joint line. Severe medial compartment joint space loss. There is no fracture or acute osseous abnormality. Chronic osteoarthritic remodeling of the medial tibial plateau. Dystrophic calcifications are present in the extensor mechanism. There is a moderate knee effusion, probably degenerative. Moderate to severe patellofemoral and mild lateral compartment osteoarthritis.  IMPRESSION: Tricompartmental osteoarthritis worst in the medial compartment with varus deformity and likely degenerative effusion.   Electronically Signed   By: Andreas Newport M.D.   On: 06/10/2013 15:45    EKG Interpretation   None       MDM  No diagnosis found. PRYOR GUETTLER is a 79 y.o. male here with R knee swelling. Arthrocentesis performed. Compression dressing placed. Will send to lab to make sure its not septic vs gout flare.   6:32 PM Joint fluid showed no crystals, few WBCs. I doubt septic knee or gout. Likely effusions. He has pain meds at home.    Richardean Canal, MD 06/10/13 [phone]
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
## 2013-06-14 LAB — BODY FLUID CULTURE: Culture: NO GROWTH

## 2013-06-15 ENCOUNTER — Other Ambulatory Visit (HOSPITAL_COMMUNITY): Payer: Self-pay | Admitting: Internal Medicine

## 2013-07-10 IMAGING — CR DG CHEST 1V PORT
1 series · 1 of 1 positions shown · non-contrast
Comparison: PA and lateral chest 11/22/2011.

CLINICAL DATA: Shortness of breath.

PORTABLE CHEST - 1 VIEW

[AP]
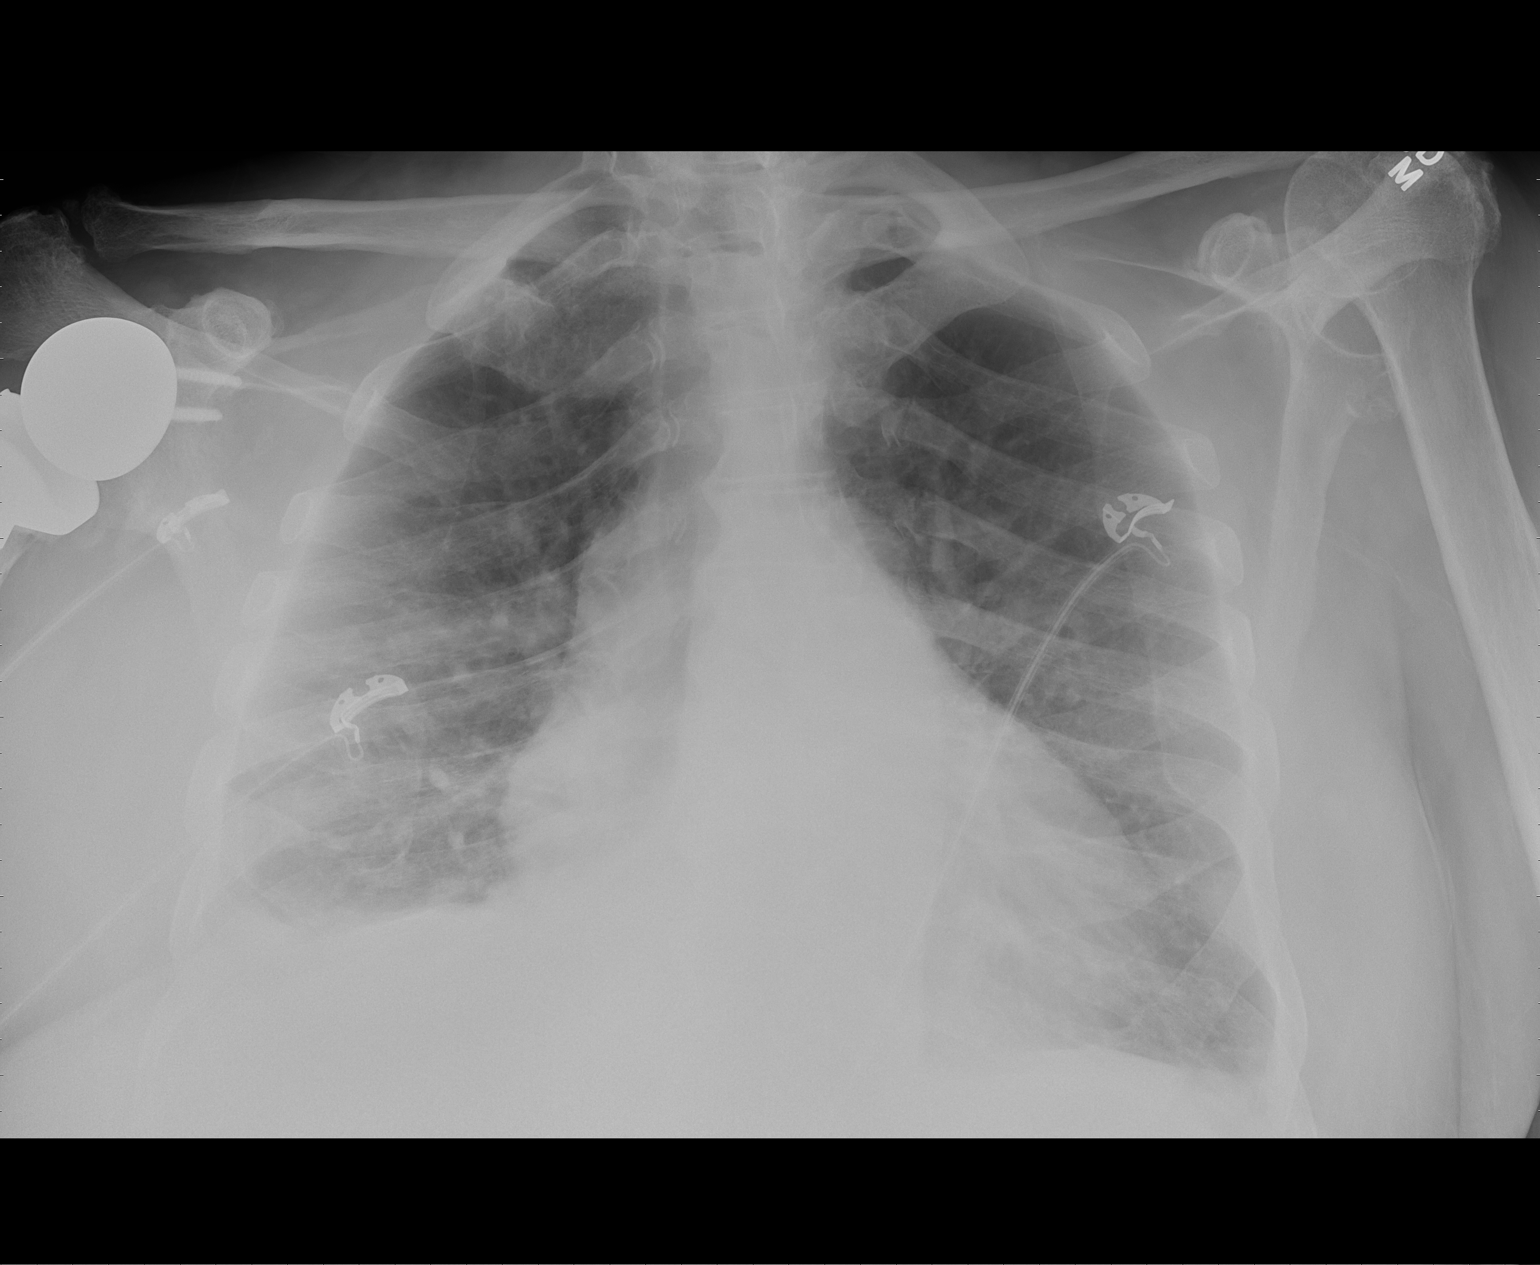

[1 of 1 positions shown; findings below may reference images not displayed]

FINDINGS: The patient is rotated on the study.  There is
cardiomegaly without edema.  Lungs appear clear.  No pneumothorax
or pleural fluid.  Right shoulder replacement noted.  Severe
degenerative disease left shoulder is identified.
IMPRESSION: Cardiomegaly without acute disease.

## 2013-07-12 ENCOUNTER — Ambulatory Visit (HOSPITAL_COMMUNITY)
Admission: RE | Admit: 2013-07-12 | Discharge: 2013-07-12 | Disposition: A | Discharge status: Home / Self Care | Payer: Medicare Other | Source: Ambulatory Visit | Attending: Internal Medicine | Admitting: Internal Medicine

## 2013-07-12 VITALS — BP 152/90 | HR 65 | Wt 232.0 lb

## 2013-07-12 DIAGNOSIS — I5032 Chronic diastolic (congestive) heart failure: Secondary | ICD-10-CM

## 2013-07-12 DIAGNOSIS — I129 Hypertensive chronic kidney disease with stage 1 through stage 4 chronic kidney disease, or unspecified chronic kidney disease: Secondary | ICD-10-CM | POA: Insufficient documentation

## 2013-07-12 DIAGNOSIS — E7800 Pure hypercholesterolemia, unspecified: Secondary | ICD-10-CM | POA: Insufficient documentation

## 2013-07-12 DIAGNOSIS — Q619 Cystic kidney disease, unspecified: Secondary | ICD-10-CM | POA: Insufficient documentation

## 2013-07-12 DIAGNOSIS — N189 Chronic kidney disease, unspecified: Secondary | ICD-10-CM | POA: Insufficient documentation

## 2013-07-12 DIAGNOSIS — E119 Type 2 diabetes mellitus without complications: Secondary | ICD-10-CM | POA: Insufficient documentation

## 2013-07-12 DIAGNOSIS — I509 Heart failure, unspecified: Secondary | ICD-10-CM | POA: Insufficient documentation

## 2013-07-12 NOTE — Patient Instructions (Addendum)
Follow up in 4 weeks  Do the following things EVERYDAY: 1) Weigh yourself in the morning before breakfast. Write it down and keep it in a log. 2) Take your medicines as prescribed 3) Eat low salt foods-Limit salt (sodium) to 2000 mg per day.  4) Stay as active as you can everyday 5) Limit all fluids for the day to less than 2 liters 

## 2013-07-12 NOTE — Addendum Note (Signed)
Encounter addended by: Theresia Bough, CMA on: 07/12/2013  4:50 PM<BR>     Documentation filed: Orders

## 2013-07-12 NOTE — Progress Notes (Signed)
Patient ID: [name], male   DOB: April 01, 1932, 79 y.o.   MRN: [id number]   Weight Range  243 pounds  Baseline proBNP   PCP: Dr Petra Kuba Actively Followed at [facility]: Dr Sunny Schlein  HPI:  [name] is a 79 yo with history of HTN, CKD, DM,  CHF mostly right-sided. (LVEF 45-50% with mild RV dysfunction)   RHC: 11/16/12  RA mean 19  RV 53/16  PA 56/23, mean 38  PCWP mean 21  Oxygen saturations:  PA 53%  AO 94%  Cardiac Output (Fick) 5.05  Cardiac Index (Fick) 2.36  PVR 3.36 WU   Admitted to [facility] in 3/14 with volume overload. He required Lasix drip and Milrinone. Renal Ultrasound negative for hydronephrosis. Large bilateral renal cysts with normal kidney size. Discharge weight- 243 pounds. Creatinine 2.22   Admitted 8//25/14 with near syncope. He said he said he was in his usual state of health till yesterday, [facility], and he was on Lotrel for blood pressure before. He got a mail-in order for Lotrel and he took it last night. He felt very dizzy especially when he was trying to get off of the bed. Went to ED.  CT scan of the head showed no acute events, he was found to have low blood pressure with systolic blood pressure in the 70s, this improved after bolus of half liter of normal saline. He was also found to be acute on chronic renal failure  With CR 3.0. He was hydrated and improved. Lotrel changed to Norvasc alone  He returns for follow up. Denies SOB/PDN/Orthopnea. Weight at home 220 pounds. He says he takes 20 mg torsemide daily and sometimes 40 mg for swelling.  He is actively followed at the [facility] hospital in [place] with PCP, cardiology and PCP. Saw them recently and said he was doing well.  Compliant with medications but did not take his blood pressure medications today. Walks with a cane.     ROS: All systems negative except as listed in HPI, PMH and Problem List.  Past Medical History  Diagnosis Date  . Pneumonia   . Lymphoma     s/p  partial gastrectomy  . Hypertension   . Hypercholesteremia   . Diabetes mellitus   . Gout   . Irregular heart beat   . Kidney stone     Current Outpatient Prescriptions  Medication Sig Dispense Refill  . acetaminophen (TYLENOL) 325 MG tablet Take 325 mg by mouth every 6 (six) hours as needed for pain.       Marland Kitchen acyclovir (ZOVIRAX) 800 MG tablet Take 800 mg by mouth 5 (five) times daily.      Marland Kitchen albuterol (PROVENTIL HFA;VENTOLIN HFA) 108 (90 BASE) MCG/ACT inhaler Inhale 2 puffs into the lungs every 6 (six) hours as needed for wheezing or shortness of breath.       . allopurinol (ZYLOPRIM) 100 MG tablet Take 100 mg by mouth daily.      Marland Kitchen amLODipine (NORVASC) 5 MG tablet Take 1 tablet (5 mg total) by mouth daily.  30 tablet  1  . Artificial Tear Ointment OINT Apply 1 application to eye at bedtime.       Marland Kitchen aspirin EC 81 MG tablet Take 81 mg by mouth daily.      . bisacodyl (DULCOLAX) 5 MG EC tablet Take 5 mg by mouth daily as needed for constipation.       . carvedilol (COREG) 12.5 MG tablet Take  12.5 mg by mouth 2 (two) times daily with a meal.       . cycloSPORINE (RESTASIS) 0.05 % ophthalmic emulsion Place 1 drop into both eyes 2 (two) times daily.      Marland Kitchen gabapentin (NEURONTIN) 100 MG capsule Take 100 mg by mouth 2 (two) times daily.      Marland Kitchen glipiZIDE (GLUCOTROL) 10 MG tablet Take 10 mg by mouth daily.        . hydrALAZINE (APRESOLINE) 25 MG tablet Take 25 mg by mouth 3 (three) times daily.      Marland Kitchen HYDROcodone-acetaminophen (NORCO/VICODIN) 5-325 MG per tablet Take 1 tablet by mouth every 4 (four) hours as needed for pain. For pain      . hydroxypropyl methylcellulose (ISOPTO TEARS) 2.5 % ophthalmic solution Place 1 drop into both eyes 3 (three) times daily as needed (dry eyes).       . isosorbide dinitrate (ISORDIL) 20 MG tablet Take 20 mg by mouth 3 (three) times daily.      . niacin 500 MG tablet Take 1,000 mg by mouth at bedtime.       . polyethylene glycol (MIRALAX / GLYCOLAX) packet Take  17 g by mouth daily as needed (constipation). For constipation      . potassium chloride SA (K-DUR,KLOR-CON) 20 MEQ tablet Take 1 tablet (20 mEq total) by mouth daily.  30 tablet  1  . senna (SENOKOT) 8.6 MG tablet Take 1 tablet by mouth daily.      . sertraline (ZOLOFT) 100 MG tablet Take 200 mg by mouth at bedtime.       . simvastatin (ZOCOR) 20 MG tablet Take 0.5 tablets (10 mg total) by mouth at bedtime.  30 tablet  1  . torsemide (DEMADEX) 20 MG tablet TAKE 2 TABLETS BY MOUTH DAILY  60 tablet  0  . traZODone (DESYREL) 50 MG tablet Take 50 mg by mouth at bedtime.       No current facility-administered medications for this encounter.     PHYSICAL EXAM: Filed Vitals:   07/12/13 1315  BP: 152/90  Pulse: 65  Weight: 232 lb (105.235 kg)  SpO2: 100%    PHYSICAL EXAM  General: Elderly. NAD in wheelchair Neck: JVP 7 cm, no thyromegaly or thyroid nodule.  Lungs: Decreased in the bases  CV: Nondisplaced PMI. Heart regular S1/S2, increased P2, no S3/S4, 2/6 systolic murmur LLSB. No carotid bruit.  Abdomen: Nontender. Large scar. +large ventral hernia Neurologic: Alert and oriented x 3. Mild dysarthria Psych: Normal affect.  Extremities: No clubbing or cyanosis. Trace ankle edema.  Drop foot on R with brace on R knee. R and LLE 1+    ASSESSMENT & PLAN:  1) Chronic diastolic CHF/right heart failure:  ECHO 10/2012  EF 45-50%.  Volume status looks great. Continue 20 mg daily and an extra 20 mg if needed. Its very difficult to determine if he is taking his medications.Will ask paramedicine to follow up with him. Reinforced need for daily weights.  Check BMET today.   2) CKD: Has been stable. Check BMET today  3) HTN: Elevated but he did not take his medications this am. Continue current regimen.  Follow up in 1 month. Refer to paramedicine.    CLEGG,AMY NP-C 1:37 PM  Patient seen with NP, agree with the above note.    Very difficult historian, I suspect he is not taking his  medications properly.  He is not volume overloaded on exam today.  We need to get him  enrolled in the paramedicine program to help with medication compliance (arranging today).  Will get BMET to follow CKD.  No medication changes.  Reviewed meds with patient.   Marca Ancona 07/12/2013 2:29 PM

## 2013-07-29 ENCOUNTER — Encounter (HOSPITAL_COMMUNITY): Payer: Self-pay | Admitting: Emergency Medicine

## 2013-07-29 ENCOUNTER — Emergency Department (HOSPITAL_COMMUNITY)
Admission: EM | Admit: 2013-07-29 | Discharge: 2013-07-29 | Disposition: A | Discharge status: Home / Self Care | Payer: Medicare Other | Attending: Emergency Medicine | Admitting: Emergency Medicine

## 2013-07-29 DIAGNOSIS — K625 Hemorrhage of anus and rectum: Secondary | ICD-10-CM

## 2013-07-29 DIAGNOSIS — I10 Essential (primary) hypertension: Secondary | ICD-10-CM | POA: Insufficient documentation

## 2013-07-29 DIAGNOSIS — M25469 Effusion, unspecified knee: Secondary | ICD-10-CM | POA: Insufficient documentation

## 2013-07-29 DIAGNOSIS — Z7982 Long term (current) use of aspirin: Secondary | ICD-10-CM | POA: Insufficient documentation

## 2013-07-29 DIAGNOSIS — K649 Unspecified hemorrhoids: Secondary | ICD-10-CM

## 2013-07-29 DIAGNOSIS — Z87442 Personal history of urinary calculi: Secondary | ICD-10-CM | POA: Insufficient documentation

## 2013-07-29 DIAGNOSIS — M109 Gout, unspecified: Secondary | ICD-10-CM | POA: Insufficient documentation

## 2013-07-29 DIAGNOSIS — M25569 Pain in unspecified knee: Secondary | ICD-10-CM | POA: Insufficient documentation

## 2013-07-29 DIAGNOSIS — M2540 Effusion, unspecified joint: Secondary | ICD-10-CM

## 2013-07-29 DIAGNOSIS — E7800 Pure hypercholesterolemia, unspecified: Secondary | ICD-10-CM | POA: Insufficient documentation

## 2013-07-29 DIAGNOSIS — M25561 Pain in right knee: Secondary | ICD-10-CM

## 2013-07-29 DIAGNOSIS — E119 Type 2 diabetes mellitus without complications: Secondary | ICD-10-CM | POA: Insufficient documentation

## 2013-07-29 DIAGNOSIS — Z79899 Other long term (current) drug therapy: Secondary | ICD-10-CM | POA: Insufficient documentation

## 2013-07-29 DIAGNOSIS — Z8572 Personal history of non-Hodgkin lymphomas: Secondary | ICD-10-CM | POA: Insufficient documentation

## 2013-07-29 DIAGNOSIS — Z8701 Personal history of pneumonia (recurrent): Secondary | ICD-10-CM | POA: Insufficient documentation

## 2013-07-29 DIAGNOSIS — K644 Residual hemorrhoidal skin tags: Secondary | ICD-10-CM | POA: Insufficient documentation

## 2013-07-29 LAB — COMPREHENSIVE METABOLIC PANEL
ALT: 20 U/L (ref 0–53)
AST: 34 U/L (ref 0–37)
Albumin: 3.4 g/dL — ABNORMAL LOW (ref 3.5–5.2)
Alkaline Phosphatase: 145 U/L — ABNORMAL HIGH (ref 39–117)
Chloride: 100 mEq/L (ref 96–112)
Potassium: 3.3 mEq/L — ABNORMAL LOW (ref 3.5–5.1)
Total Bilirubin: 0.5 mg/dL (ref 0.3–1.2)
Total Protein: 6.9 g/dL (ref 6.0–8.3)

## 2013-07-29 LAB — CBC
HCT: 31.5 % — ABNORMAL LOW (ref 39.0–52.0)
MCH: 30.7 pg (ref 26.0–34.0)
MCV: 88.7 fL (ref 78.0–100.0)
RBC: 3.55 MIL/uL — ABNORMAL LOW (ref 4.22–5.81)
WBC: 5.3 10*3/uL (ref 4.0–10.5)

## 2013-07-29 LAB — PROTIME-INR
INR: 1.17 (ref 0.00–1.49)
Prothrombin Time: 14.7 seconds (ref 11.6–15.2)

## 2013-07-29 LAB — OCCULT BLOOD, POC DEVICE: Fecal Occult Bld: POSITIVE — AB

## 2013-07-29 MED ORDER — MORPHINE SULFATE 4 MG/ML IJ SOLN
4.0000 mg | Freq: Once | INTRAMUSCULAR | Status: AC
  Administered 2013-07-29: 4 mg via INTRAMUSCULAR
  Filled 2013-07-29: qty 1

## 2013-07-29 MED ORDER — OXYCODONE-ACETAMINOPHEN 5-325 MG PO TABS
1.0000 | ORAL_TABLET | Freq: Once | ORAL | Status: AC
  Administered 2013-07-29: 1 via ORAL
  Filled 2013-07-29: qty 1

## 2013-07-29 MED ORDER — POTASSIUM CHLORIDE 20 MEQ/15ML (10%) PO LIQD
40.0000 meq | Freq: Once | ORAL | Status: AC
  Administered 2013-07-29: 40 meq via ORAL
  Filled 2013-07-29: qty 30

## 2013-07-29 MED ORDER — OXYCODONE-ACETAMINOPHEN 5-325 MG PO TABS: 1.0000 | ORAL_TABLET | ORAL | Status: DC | PRN

## 2013-07-29 MED ORDER — DOCUSATE SODIUM 100 MG PO CAPS: 100.0000 mg | ORAL_CAPSULE | Freq: Two times a day (BID) | ORAL | Status: DC

## 2013-07-29 NOTE — ED Notes (Signed)
Patient currently working on acquiring a ride home.

## 2013-07-29 NOTE — ED Notes (Signed)
Dr. Ward at bedside with patient.

## 2013-07-29 NOTE — ED Notes (Signed)
Pt given [place] sandwich and coffee. Urinal at bedside within reach of patient. Offers no complaints at this time.

## 2013-07-29 NOTE — ED Provider Notes (Signed)
TIME SEEN: 5:33 PM  CHIEF COMPLAINT: Right knee pain, blood in stool  HPI: Patient is a 79 year old male with a history of lymphoma status post partial gastrectomy, hypertension, diabetes, hyperlipidemia, kidney stones who presents the emergency department with multiple complaints. Patient reports that he has a mild, diffuse, throbbing headache. It is similar to prior headaches. No history of head injury. No numbness, tingling or focal weakness. No fever, neck pain or neck sickness.   He is also complaining of right knee pain. Patient states she has had right knee pain for greater than 10 years due to arthritis. He has had intermittent joint effusion and is followed by physician at the [facility] clinic who has been giving him cortisone injections. His last cortisone injection was one month ago. He reports he is not a candidate for knee replacement given his other medical issues. Denies any new injury to the knee. Pain is worse with walking. Better with rest. No radiation. No erythema or warmth.   Patient is also complaining of bright red blood in his stool. He states that his stool has been slightly harder than normal and he has been taking stool softeners and MiraLax. Today his stool was very loose but he was still noticing blood. No pain with bowel movements. He has a history of hemorrhoids. His last colonoscopy was 2 years ago and reports he did have polyps removed. Denies any melena. No vomiting or diarrhea. He is not on anticoagulation. Denies any abdominal pain. No chest pain or shortness of breath. He states he does feel weak. No lightheadedness.  ROS: See HPI Constitutional: no fever  Eyes: no drainage  ENT: no runny nose   Cardiovascular:  no chest pain  Resp: no SOB  GI: no vomiting GU: no dysuria Integumentary: no rash  Allergy: no hives  Musculoskeletal: no leg swelling  Neurological: no slurred speech ROS otherwise negative  PAST MEDICAL HISTORY/PAST SURGICAL HISTORY:  Past Medical  History  Diagnosis Date  . Pneumonia   . Lymphoma     s/p partial gastrectomy  . Hypertension   . Hypercholesteremia   . Diabetes mellitus   . Gout   . Irregular heart beat   . Kidney stone     MEDICATIONS:  Prior to Admission medications   Medication Sig Start Date End Date Taking? Authorizing Provider  acetaminophen (TYLENOL) 325 MG tablet Take 325 mg by mouth every 6 (six) hours as needed for pain.     Historical Provider, MD  acyclovir (ZOVIRAX) 800 MG tablet Take 800 mg by mouth 5 (five) times daily.    Historical Provider, MD  albuterol (PROVENTIL HFA;VENTOLIN HFA) 108 (90 BASE) MCG/ACT inhaler Inhale 2 puffs into the lungs every 6 (six) hours as needed for wheezing or shortness of breath.     Historical Provider, MD  allopurinol (ZYLOPRIM) 100 MG tablet Take 100 mg by mouth daily.    Historical Provider, MD  amLODipine (NORVASC) 5 MG tablet Take 1 tablet (5 mg total) by mouth daily. 11/25/12   Henderson Cloud, MD  Artificial Tear Ointment OINT Apply 1 application to eye at bedtime.     Historical Provider, MD  aspirin EC 81 MG tablet Take 81 mg by mouth daily.    Historical Provider, MD  bisacodyl (DULCOLAX) 5 MG EC tablet Take 5 mg by mouth daily as needed for constipation.     Historical Provider, MD  carvedilol (COREG) 12.5 MG tablet Take 12.5 mg by mouth 2 (two) times daily with a  meal.     Historical Provider, MD  cycloSPORINE (RESTASIS) 0.05 % ophthalmic emulsion Place 1 drop into both eyes 2 (two) times daily.    Historical Provider, MD  gabapentin (NEURONTIN) 100 MG capsule Take 100 mg by mouth 2 (two) times daily.    Historical Provider, MD  glipiZIDE (GLUCOTROL) 10 MG tablet Take 10 mg by mouth daily.      Historical Provider, MD  hydrALAZINE (APRESOLINE) 25 MG tablet Take 25 mg by mouth 3 (three) times daily.    Historical Provider, MD  HYDROcodone-acetaminophen (NORCO/VICODIN) 5-325 MG per tablet Take 1 tablet by mouth every 4 (four) hours as needed for pain.  For pain    Historical Provider, MD  hydroxypropyl methylcellulose (ISOPTO TEARS) 2.5 % ophthalmic solution Place 1 drop into both eyes 3 (three) times daily as needed (dry eyes).     Historical Provider, MD  isosorbide dinitrate (ISORDIL) 20 MG tablet Take 20 mg by mouth 3 (three) times daily.    Historical Provider, MD  niacin 500 MG tablet Take 1,000 mg by mouth at bedtime.     Historical Provider, MD  polyethylene glycol (MIRALAX / GLYCOLAX) packet Take 17 g by mouth daily as needed (constipation). For constipation    Historical Provider, MD  potassium chloride SA (K-DUR,KLOR-CON) 20 MEQ tablet Take 1 tablet (20 mEq total) by mouth daily. 01/27/13   Dolores Patty, MD  senna (SENOKOT) 8.6 MG tablet Take 1 tablet by mouth daily.    Historical Provider, MD  sertraline (ZOLOFT) 100 MG tablet Take 200 mg by mouth at bedtime.     Historical Provider, MD  simvastatin (ZOCOR) 20 MG tablet Take 0.5 tablets (10 mg total) by mouth at bedtime. 04/12/13   Dolores Patty, MD  torsemide (DEMADEX) 20 MG tablet TAKE 2 TABLETS BY MOUTH DAILY 06/15/13   Dolores Patty, MD  traZODone (DESYREL) 50 MG tablet Take 50 mg by mouth at bedtime.    Historical Provider, MD    ALLERGIES:  No Known Allergies  SOCIAL HISTORY:  History  Substance Use Topics  . Smoking status: Never Smoker   . Smokeless tobacco: Never Used  . Alcohol Use: No    FAMILY HISTORY: History reviewed. No pertinent family history.  EXAM: BP 137/74  Pulse 60  Temp(Src) 97.4 F (36.3 C) (Oral)  Resp 18  SpO2 100% CONSTITUTIONAL: Alert and oriented and responds appropriately to questions. Well-appearing; well-nourished HEAD: Normocephalic EYES: Conjunctivae clear, PERRL ENT: normal nose; no rhinorrhea; moist mucous membranes; pharynx without lesions noted NECK: Supple, no meningismus, no LAD  CARD: RRR; S1 and S2 appreciated; no murmurs, no clicks, no rubs, no gallops RESP: Normal chest excursion without splinting or  tachypnea; breath sounds clear and equal bilaterally; no wheezes, no rhonchi, no rales,  ABD/GI: Normal bowel sounds; non-distended; soft, non-tender, no rebound, no guarding; large ventral abdominal hernia that is reducible RECTAL:  Normal rectal tone, patient has multiple external nonthrombosed hemorrhoids, no gross blood or melena BACK:  The back appears normal and is non-tender to palpation, there is no CVA tenderness EXT: Patient has a large right knee joint effusion without warmth or erythema or induration, no ligamentous laxity, full range of motion in his right knee with pain with full extension, sensation to light touch intact diffusely, 2+ DP pulses bilaterally, no bony tenderness, otherwise Normal ROM in all joints; non-tender to palpation; no edema; normal capillary refill; no cyanosis    SKIN: Normal color for age and race; warm NEURO:  Moves all extremities equally PSYCH: The patient's mood and manner are appropriate. Grooming and personal hygiene are appropriate.  MEDICAL DECISION MAKING: Patient here with multiple complaints. Will obtain basic labs to evaluate for possible anemia given reports of rectal bleeding. I feel this may be due to hemorrhoids. He has no rectal bleeding currently and is hemodynamically stable. Abdomen is soft and nontender. He reports is his headache is mild and similar to prior headaches. No neurologic deficits. I do not feel he needs head imaging at this time. As for patient's right knee pain, this is chronic. There is no history of new injury. No signs of septic arthritis on exam. Will give pain medication and reassess. Anticipate if workup is unremarkable, patient can be discharged home with outpatient followup.  ED PROGRESS: Patient reports his pain has improved. His vital signs have been stable while in the emergency department and he has had no further episodes of bloody stool. His hemoglobin is baseline at 10.9. His other labs are reassuring. His creatinine  is stable. He does have mild hypokalemia. Will replace in the ED. His Hemoccult was guaiac positive but again there was no gross blood. His bloody stool may be due to his hemorrhoids. I feel the patient is safe to be discharged home to followup with his primary care physician. Have given return precautions. Patient verbalized understanding and is comfortable with this plan.     Layla Maw Kareen Hitsman, DO 07/29/13 1946

## 2013-07-29 NOTE — ED Notes (Signed)
Pt c/o right knee pain, HA and blood in stool from hemorrhoid

## 2013-08-04 ENCOUNTER — Emergency Department (HOSPITAL_COMMUNITY)
Admission: EM | Admit: 2013-08-04 | Discharge: 2013-08-04 | Disposition: A | Discharge status: Home / Self Care | Payer: Medicare Other | Attending: Emergency Medicine | Admitting: Emergency Medicine

## 2013-08-04 ENCOUNTER — Encounter (HOSPITAL_COMMUNITY): Payer: Self-pay | Admitting: Emergency Medicine

## 2013-08-04 DIAGNOSIS — Z8701 Personal history of pneumonia (recurrent): Secondary | ICD-10-CM | POA: Insufficient documentation

## 2013-08-04 DIAGNOSIS — Z7982 Long term (current) use of aspirin: Secondary | ICD-10-CM | POA: Insufficient documentation

## 2013-08-04 DIAGNOSIS — M25569 Pain in unspecified knee: Secondary | ICD-10-CM | POA: Insufficient documentation

## 2013-08-04 DIAGNOSIS — M109 Gout, unspecified: Secondary | ICD-10-CM | POA: Insufficient documentation

## 2013-08-04 DIAGNOSIS — R079 Chest pain, unspecified: Secondary | ICD-10-CM | POA: Insufficient documentation

## 2013-08-04 DIAGNOSIS — Z79899 Other long term (current) drug therapy: Secondary | ICD-10-CM | POA: Insufficient documentation

## 2013-08-04 DIAGNOSIS — E7800 Pure hypercholesterolemia, unspecified: Secondary | ICD-10-CM | POA: Insufficient documentation

## 2013-08-04 DIAGNOSIS — I10 Essential (primary) hypertension: Secondary | ICD-10-CM | POA: Insufficient documentation

## 2013-08-04 DIAGNOSIS — Z872 Personal history of diseases of the skin and subcutaneous tissue: Secondary | ICD-10-CM | POA: Insufficient documentation

## 2013-08-04 DIAGNOSIS — Z9660 Presence of unspecified orthopedic joint implant: Secondary | ICD-10-CM | POA: Insufficient documentation

## 2013-08-04 DIAGNOSIS — I509 Heart failure, unspecified: Secondary | ICD-10-CM | POA: Insufficient documentation

## 2013-08-04 DIAGNOSIS — M25561 Pain in right knee: Secondary | ICD-10-CM

## 2013-08-04 DIAGNOSIS — M25519 Pain in unspecified shoulder: Secondary | ICD-10-CM | POA: Insufficient documentation

## 2013-08-04 DIAGNOSIS — E119 Type 2 diabetes mellitus without complications: Secondary | ICD-10-CM | POA: Insufficient documentation

## 2013-08-04 DIAGNOSIS — G8929 Other chronic pain: Secondary | ICD-10-CM | POA: Insufficient documentation

## 2013-08-04 DIAGNOSIS — K439 Ventral hernia without obstruction or gangrene: Secondary | ICD-10-CM | POA: Insufficient documentation

## 2013-08-04 DIAGNOSIS — Z87442 Personal history of urinary calculi: Secondary | ICD-10-CM | POA: Insufficient documentation

## 2013-08-04 HISTORY — DX: Heart failure, unspecified: I50.9

## 2013-08-04 NOTE — ED Notes (Signed)
Pt. States he has right knee pain. Pt. Reports that he had fluid drawn off one month ago. Hx of arthritis.

## 2013-08-04 NOTE — ED Notes (Addendum)
Pt. Reports left shoulder pain that radiates to chest starting 3 days ago.

## 2013-08-04 NOTE — ED Provider Notes (Addendum)
I saw and evaluated the patient, reviewed the resident's note and I agree with the findings and plan.  Chronic right knee pain.  Multiple prior evaluations the emergency department.  No indication for urgent or emergent arthrocentesis today.  Normal pulses in right foot.  No signs of septic arthritis at this time.  Outpatient orthopedic followup.  Severe OA  I personally evaluated the ECG and agree with the interpretation of the resident  Lyanne Co, MD 08/04/13 1559  Lyanne Co, MD 08/20/13 0000

## 2013-08-04 NOTE — ED Provider Notes (Signed)
CSN: [id number]     Arrival date & time 08/04/13  1219 History   First MD Initiated Contact with Patient 08/04/13 1231     Chief Complaint  Patient presents with  . Knee Pain  . Shoulder Pain  . Chest Pain   (Consider location/radiation/quality/duration/timing/severity/associated sxs/prior Treatment) HPI Comments: [name] is a 79 year old male with a PMH of acute on chronic HF, HTN, DM, gout and OA.  He presents with chronic knee pain and swelling and reports a history of arthritis.  He says he follows with the [place] in [place] but if he cannot make it there he comes to the ED to have his knee drained.  He was most recently seen at [facility] ED on 07/29/13.  He is unable to tell me when this episode of swelling began.  He notes bilateral shoulder pain that feels like his usual arthritis pain.  He denies leg pain or SOB.  He is otherwise in his usual state of health and able to ambulate with a cane.     Patient is a 79 y.o. male presenting with knee pain, shoulder pain, and chest pain.  Knee Pain Shoulder Pain Pertinent negatives include no chest pain, nausea or vomiting.  Chest Pain Associated symptoms: no nausea, no shortness of breath and not vomiting     Past Medical History  Diagnosis Date  . Pneumonia   . Hypertension   . Hypercholesteremia   . Diabetes mellitus   . Gout   . Irregular heart beat   . Kidney stone   . Lymphoma     s/p partial gastrectomy  . CHF (congestive heart failure)    Past Surgical History  Procedure Laterality Date  . Partial gastrectomy  1994    for lymphoma  . Hernia repair    . Joint replacement    . Back surgery    . Esophagogastroduodenoscopy N/A 10/28/2012    Procedure: ESOPHAGOGASTRODUODENOSCOPY (EGD);  Surgeon: Shirley Friar, MD;  Location: [facility];  Service: Endoscopy;  Laterality: N/A;   No family history on file. History  Substance Use Topics  . Smoking status: Never Smoker   . Smokeless tobacco: Never Used  . Alcohol Use: No     Review of Systems  Constitutional: Negative for appetite change.  Respiratory: Negative for chest tightness and shortness of breath.   Cardiovascular: Negative for chest pain and leg swelling.  Gastrointestinal: Negative for nausea, vomiting, diarrhea and constipation.    Allergies  Review of patient's allergies indicates no known allergies.  Home Medications   Current Outpatient Rx  Name  Route  Sig  Dispense  Refill  . acetaminophen (TYLENOL) 325 MG tablet   Oral   Take 325 mg by mouth every 6 (six) hours as needed for pain.          Marland Kitchen albuterol (PROVENTIL HFA;VENTOLIN HFA) 108 (90 BASE) MCG/ACT inhaler   Inhalation   Inhale 2 puffs into the lungs every 6 (six) hours as needed for wheezing or shortness of breath.          . allopurinol (ZYLOPRIM) 100 MG tablet   Oral   Take 100 mg by mouth daily.         Marland Kitchen amLODipine (NORVASC) 5 MG tablet   Oral   Take 1 tablet (5 mg total) by mouth daily.   30 tablet   1   . aspirin EC 81 MG tablet   Oral   Take 81 mg by mouth daily.         Marland Kitchen  bisacodyl (DULCOLAX) 5 MG EC tablet   Oral   Take 5 mg by mouth daily as needed for constipation.          . carvedilol (COREG) 12.5 MG tablet   Oral   Take 12.5 mg by mouth 2 (two) times daily with a meal.          . cycloSPORINE (RESTASIS) 0.05 % ophthalmic emulsion   Both Eyes   Place 1 drop into both eyes 2 (two) times daily.         Marland Kitchen docusate sodium (COLACE) 100 MG capsule   Oral   Take 1 capsule (100 mg total) by mouth every 12 (twelve) hours.   30 capsule   0   . gabapentin (NEURONTIN) 100 MG capsule   Oral   Take 100 mg by mouth 2 (two) times daily.         Marland Kitchen glipiZIDE (GLUCOTROL) 10 MG tablet   Oral   Take 10 mg by mouth daily.           . hydrALAZINE (APRESOLINE) 25 MG tablet   Oral   Take 25 mg by mouth 3 (three) times daily.         Marland Kitchen HYDROcodone-acetaminophen (NORCO/VICODIN) 5-325 MG per tablet   Oral   Take 1 tablet by mouth every 6  (six) hours as needed for moderate pain.         . hydroxypropyl methylcellulose (ISOPTO TEARS) 2.5 % ophthalmic solution   Both Eyes   Place 1 drop into both eyes 3 (three) times daily as needed (dry eyes).          . isosorbide dinitrate (ISORDIL) 20 MG tablet   Oral   Take 20 mg by mouth 3 (three) times daily.         . niacin 500 MG tablet   Oral   Take 1,000 mg by mouth at bedtime.          Marland Kitchen oxyCODONE-acetaminophen (PERCOCET/ROXICET) 5-325 MG per tablet   Oral   Take 1 tablet by mouth every 4 (four) hours as needed.   15 tablet   0   . polyethylene glycol (MIRALAX / GLYCOLAX) packet   Oral   Take 17 g by mouth daily as needed (constipation). For constipation         . potassium chloride SA (K-DUR,KLOR-CON) 20 MEQ tablet   Oral   Take 1 tablet (20 mEq total) by mouth daily.   30 tablet   1   . sertraline (ZOLOFT) 100 MG tablet   Oral   Take 200 mg by mouth at bedtime.          . simvastatin (ZOCOR) 20 MG tablet   Oral   Take 0.5 tablets (10 mg total) by mouth at bedtime.   30 tablet   1   . torsemide (DEMADEX) 20 MG tablet      TAKE 2 TABLETS BY MOUTH DAILY   60 tablet   0   . traZODone (DESYREL) 50 MG tablet   Oral   Take 50 mg by mouth at bedtime.          BP 121/64  Pulse 59  Temp(Src) 98.1 F (36.7 C) (Oral)  Resp 13  Ht 6\' 2"  (1.88 m)  Wt 220 lb (99.791 kg)  BMI 28.23 kg/m2  SpO2 97% Physical Exam  Constitutional: He is oriented to person, place, and time. He appears well-developed and well-nourished. No distress.  HENT:  Mouth/Throat: Oropharynx is  clear and moist. No oropharyngeal exudate.  Cardiovascular: Normal rate, regular rhythm and normal heart sounds.   Pulmonary/Chest: Effort normal and breath sounds normal. No respiratory distress. He has no wheezes. He has no rales.  Abdominal: Soft. Bowel sounds are normal. He exhibits no distension. There is no tenderness. There is no guarding.  + abdominal hernia    Musculoskeletal:  R knee with mild swelling; joint moderately TTP; + crepitus; no redness or increased warmth  Neurological: He is alert and oriented to person, place, and time.  Skin: He is not diaphoretic.  Psychiatric: He has a normal mood and affect. His behavior is normal.    ED Course  Procedures (including critical care time) Labs Review Labs Reviewed - No data to display Imaging Review No results found.  EKG Interpretation    Date/Time:  Thursday August 04 2013 12:51:33 EST Ventricular Rate:  57 PR Interval:  320 QRS Duration: 158 QT Interval:  508 QTC Calculation: 495 R Axis:   43 Text Interpretation:  Sinus or ectopic atrial rhythm Prolonged PR interval Right bundle branch block No significant change was found Confirmed by CAMPOS  MD, KEVIN ([id number]) on 08/04/2013 1:32:57 PM            MDM  79 year old male with a PMH of acute on chronic HF, HTN, DM, CKD3, gout and OA presenting with chronic knee pain and swelling.  He says he came to get his knee aspirated, which has been done on recent trips to the ED.  He has had frequent ED visits in the past year including 4 visits in the past 4 months for right knee pain.  His presentation is consistent with OA and there is no concern for septic joint.  Patient is stable for discharge home.  He has been advised that he should follow-up with [facility] where he has had knee injections in the past.  He has also been provided with the name of a local orthopedic group so he does not have to travel all the way to [facility].  He says he has enough pain medication at home.     Evelena Peat, DO 08/04/13 [phone]

## 2013-08-04 NOTE — ED Notes (Signed)
Pt. In no acute distress

## 2013-08-11 ENCOUNTER — Other Ambulatory Visit (HOSPITAL_COMMUNITY): Payer: Self-pay | Admitting: *Deleted

## 2013-08-11 ENCOUNTER — Encounter (HOSPITAL_COMMUNITY): Payer: Medicare Other

## 2013-08-11 ENCOUNTER — Encounter (HOSPITAL_COMMUNITY): Payer: Self-pay

## 2013-08-11 ENCOUNTER — Ambulatory Visit (HOSPITAL_COMMUNITY)
Admission: RE | Admit: 2013-08-11 | Discharge: 2013-08-11 | Disposition: A | Discharge status: Home / Self Care | Payer: Medicare Other | Source: Ambulatory Visit | Attending: Internal Medicine | Admitting: Internal Medicine

## 2013-08-11 VITALS — BP 130/78 | HR 55 | Ht 74.0 in | Wt 192.5 lb

## 2013-08-11 DIAGNOSIS — I5032 Chronic diastolic (congestive) heart failure: Secondary | ICD-10-CM

## 2013-08-11 DIAGNOSIS — E876 Hypokalemia: Secondary | ICD-10-CM

## 2013-08-11 DIAGNOSIS — N183 Chronic kidney disease, stage 3 unspecified: Secondary | ICD-10-CM

## 2013-08-11 LAB — BASIC METABOLIC PANEL
BUN: 38 mg/dL — ABNORMAL HIGH (ref 6–23)
Calcium: 9.2 mg/dL (ref 8.4–10.5)
GFR calc non Af Amer: 36 mL/min — ABNORMAL LOW (ref >90)
Glucose, Bld: 120 mg/dL — ABNORMAL HIGH (ref 70–99)

## 2013-08-11 MED ORDER — ALLOPURINOL 100 MG PO TABS: 100.0000 mg | ORAL_TABLET | Freq: Every day | ORAL | Status: DC

## 2013-08-11 MED ORDER — POTASSIUM CHLORIDE CRYS ER 20 MEQ PO TBCR: 20.0000 meq | EXTENDED_RELEASE_TABLET | Freq: Every day | ORAL | Status: DC

## 2013-08-11 NOTE — Progress Notes (Signed)
Patient ID: [name], male   DOB: [birth year], 79 y.o.   MRN: [id number]   Weight Range  243 pounds  Baseline proBNP   PCP: Dr Petra Kuba Actively Followed at [facility]: Dr Sunny Schlein  HPI: [name] is a 79 yo with history of HTN, CKD, DM,  CHF mostly right-sided. (LVEF 45-50% with mild RV dysfunction)   RHC: 11/16/12  RA mean 19  RV 53/16  PA 56/23, mean 38  PCWP mean 21  Oxygen saturations:  PA 53%  AO 94%  Cardiac Output (Fick) 5.05  Cardiac Index (Fick) 2.36  PVR 3.36 WU   Admitted to [facility] in 3/14 with volume overload. He required Lasix drip and Milrinone. Renal Ultrasound negative for hydronephrosis. Large bilateral renal cysts with normal kidney size. Discharge weight- 243 pounds. Creatinine 2.22   Admitted 8//25/14 with near syncope. He said he said he was in his usual state of health till yesterday, [facility], and he was on Lotrel for blood pressure before. He got a mail-in order for Lotrel and he took it last night. He felt very dizzy especially when he was trying to get off of the bed. Went to ED.  CT scan of the head showed no acute events, he was found to have low blood pressure with systolic blood pressure in the 70s, this improved after bolus of half liter of normal saline. He was also found to be acute on chronic renal failure  With CR 3.0. He was hydrated and improved. Lotrel changed to Norvasc .  He returns for follow up. Denies SOB/PDN/Orthopnea. Complains of L knee pain. Weight at home 210-220 pounds. He says he takes 1 torsemide and sometimes he takes 2 torsemide for swelling. He is actively followed at the [facility] hospital in [place] with PCP, cardiology and PCP with the next appointment 08/17/13.   Taking all medications. He is not currently taking potassium  Walks with a cane. Followed by paramedicine.   Labs 07/29/13 K 3.3 Creatinine 1.66   SH: Lives alone. Does not drink alcohol or smoke.   ROS: All systems negative except as  listed in HPI, PMH and Problem List.  Past Medical History  Diagnosis Date  . Pneumonia   . Hypertension   . Hypercholesteremia   . Diabetes mellitus   . Gout   . Irregular heart beat   . Kidney stone   . Lymphoma     s/p partial gastrectomy  . CHF (congestive heart failure)     Current Outpatient Prescriptions  Medication Sig Dispense Refill  . acetaminophen (TYLENOL) 325 MG tablet Take 325 mg by mouth every 6 (six) hours as needed for pain.       Marland Kitchen albuterol (PROVENTIL HFA;VENTOLIN HFA) 108 (90 BASE) MCG/ACT inhaler Inhale 2 puffs into the lungs every 6 (six) hours as needed for wheezing or shortness of breath.       . allopurinol (ZYLOPRIM) 100 MG tablet Take 100 mg by mouth daily.      Marland Kitchen amLODipine (NORVASC) 5 MG tablet Take 1 tablet (5 mg total) by mouth daily.  30 tablet  1  . aspirin EC 81 MG tablet Take 81 mg by mouth daily.      . bisacodyl (DULCOLAX) 5 MG EC tablet Take 5 mg by mouth daily as needed for constipation.       . carvedilol (COREG) 12.5 MG tablet Take 12.5 mg by mouth 2 (two) times daily with a meal.       .  cycloSPORINE (RESTASIS) 0.05 % ophthalmic emulsion Place 1 drop into both eyes 2 (two) times daily.      Marland Kitchen docusate sodium (COLACE) 100 MG capsule Take 1 capsule (100 mg total) by mouth every 12 (twelve) hours.  30 capsule  0  . gabapentin (NEURONTIN) 100 MG capsule Take 100 mg by mouth 2 (two) times daily.      Marland Kitchen glipiZIDE (GLUCOTROL) 10 MG tablet Take 10 mg by mouth daily.        . hydrALAZINE (APRESOLINE) 25 MG tablet Take 25 mg by mouth 3 (three) times daily.      Marland Kitchen HYDROcodone-acetaminophen (NORCO/VICODIN) 5-325 MG per tablet Take 1 tablet by mouth every 6 (six) hours as needed for moderate pain.      . hydroxypropyl methylcellulose (ISOPTO TEARS) 2.5 % ophthalmic solution Place 1 drop into both eyes 3 (three) times daily as needed (dry eyes).       . isosorbide dinitrate (ISORDIL) 20 MG tablet Take 20 mg by mouth 3 (three) times daily.      . niacin  500 MG tablet Take 1,000 mg by mouth at bedtime.       Marland Kitchen oxyCODONE-acetaminophen (PERCOCET/ROXICET) 5-325 MG per tablet Take 1 tablet by mouth every 4 (four) hours as needed.  15 tablet  0  . polyethylene glycol (MIRALAX / GLYCOLAX) packet Take 17 g by mouth daily as needed (constipation). For constipation      . potassium chloride SA (K-DUR,KLOR-CON) 20 MEQ tablet Take 1 tablet (20 mEq total) by mouth daily.  30 tablet  1  . sertraline (ZOLOFT) 100 MG tablet Take 200 mg by mouth at bedtime.       . simvastatin (ZOCOR) 20 MG tablet Take 0.5 tablets (10 mg total) by mouth at bedtime.  30 tablet  1  . torsemide (DEMADEX) 20 MG tablet TAKE 2 TABLETS BY MOUTH DAILY  60 tablet  0  . traZODone (DESYREL) 50 MG tablet Take 50 mg by mouth at bedtime.       No current facility-administered medications for this encounter.     PHYSICAL EXAM: Filed Vitals:   08/11/13 1339  BP: 130/78  Pulse: 55  Height: 6\' 2"  (1.88 m)  Weight: 192 lb 8 oz (87.317 kg)  SpO2: 99%    PHYSICAL EXAM  General: Elderly. NAD ambulated into the clinic with a cane Neck: JVP flat, no thyromegaly or thyroid nodule.  Lungs: Decreased in the bases  CV: Nondisplaced PMI. Heart regular S1/S2, increased P2, no S3/S4, 2/6 systolic murmur LLSB. No carotid bruit.  Abdomen: Nontender. Large scar. +large ventral hernia Neurologic: Alert and oriented x 3. Mild dysarthria Psych: Normal affect.  Extremities: No clubbing or cyanosis. Trace ankle edema.  Drop foot on R with brace on R knee.  ASSESSMENT & PLAN:  1) Chronic diastolic CHF/right heart failure:  ECHO 10/2012  EF 45-50%.  Volume status looks very good.  Continue torsemide 20 mg daily and an extra 20 mg if needed. Not taking potassium as directed by [facility]. Potassium low at last visit . Will check BMET today.  Followed paramedicine and will continue.  Reinforced need for daily weights,  2) CKD: Has been stable. Check BMET today  3) HTN: Stable. Continue current  regimen.  4) Hypokalemia. Check BMET today.   Follow up in  2 -3 months.   Cristen Bredeson NP-C 1:47 PM

## 2013-08-11 NOTE — Patient Instructions (Signed)
Follow up in 2-3 months  Do the following things EVERYDAY: 1. Weigh yourself in the morning before breakfast. Write it down and keep it in a log. 2. Take your medicines as prescribed 3. Eat low salt foods-Limit salt (sodium) to 2000 mg per day.  4. Stay as active as you can everyday 5. Limit all fluids for the day to less than 2 liters 

## 2013-09-19 ENCOUNTER — Other Ambulatory Visit (HOSPITAL_COMMUNITY): Payer: Self-pay | Admitting: Internal Medicine

## 2013-09-24 ENCOUNTER — Encounter (HOSPITAL_COMMUNITY): Payer: Self-pay | Admitting: Emergency Medicine

## 2013-09-24 ENCOUNTER — Emergency Department (HOSPITAL_COMMUNITY)
Admission: EM | Admit: 2013-09-24 | Discharge: 2013-09-25 | Disposition: A | Discharge status: Home / Self Care | Payer: Non-veteran care | Attending: Emergency Medicine | Admitting: Emergency Medicine

## 2013-09-24 DIAGNOSIS — S298XXA Other specified injuries of thorax, initial encounter: Secondary | ICD-10-CM | POA: Insufficient documentation

## 2013-09-24 DIAGNOSIS — Z87442 Personal history of urinary calculi: Secondary | ICD-10-CM | POA: Insufficient documentation

## 2013-09-24 DIAGNOSIS — Z79899 Other long term (current) drug therapy: Secondary | ICD-10-CM | POA: Insufficient documentation

## 2013-09-24 DIAGNOSIS — S80812A Abrasion, left lower leg, initial encounter: Secondary | ICD-10-CM

## 2013-09-24 DIAGNOSIS — I509 Heart failure, unspecified: Secondary | ICD-10-CM | POA: Insufficient documentation

## 2013-09-24 DIAGNOSIS — Z23 Encounter for immunization: Secondary | ICD-10-CM | POA: Insufficient documentation

## 2013-09-24 DIAGNOSIS — E119 Type 2 diabetes mellitus without complications: Secondary | ICD-10-CM | POA: Insufficient documentation

## 2013-09-24 DIAGNOSIS — IMO0002 Reserved for concepts with insufficient information to code with codable children: Secondary | ICD-10-CM | POA: Insufficient documentation

## 2013-09-24 DIAGNOSIS — I10 Essential (primary) hypertension: Secondary | ICD-10-CM | POA: Insufficient documentation

## 2013-09-24 DIAGNOSIS — Z7982 Long term (current) use of aspirin: Secondary | ICD-10-CM | POA: Insufficient documentation

## 2013-09-24 DIAGNOSIS — Z9660 Presence of unspecified orthopedic joint implant: Secondary | ICD-10-CM | POA: Insufficient documentation

## 2013-09-24 DIAGNOSIS — Y9389 Activity, other specified: Secondary | ICD-10-CM | POA: Insufficient documentation

## 2013-09-24 DIAGNOSIS — S80811A Abrasion, right lower leg, initial encounter: Secondary | ICD-10-CM

## 2013-09-24 DIAGNOSIS — M109 Gout, unspecified: Secondary | ICD-10-CM | POA: Insufficient documentation

## 2013-09-24 DIAGNOSIS — Z903 Acquired absence of stomach [part of]: Secondary | ICD-10-CM | POA: Insufficient documentation

## 2013-09-24 DIAGNOSIS — Z8701 Personal history of pneumonia (recurrent): Secondary | ICD-10-CM | POA: Insufficient documentation

## 2013-09-24 DIAGNOSIS — Z87898 Personal history of other specified conditions: Secondary | ICD-10-CM | POA: Insufficient documentation

## 2013-09-24 DIAGNOSIS — E7800 Pure hypercholesterolemia, unspecified: Secondary | ICD-10-CM | POA: Insufficient documentation

## 2013-09-24 DIAGNOSIS — Y929 Unspecified place or not applicable: Secondary | ICD-10-CM | POA: Insufficient documentation

## 2013-09-24 MED ORDER — TETANUS-DIPHTH-ACELL PERTUSSIS 5-2.5-18.5 LF-MCG/0.5 IM SUSP
0.5000 mL | Freq: Once | INTRAMUSCULAR | Status: AC
  Administered 2013-09-25: 0.5 mL via INTRAMUSCULAR
  Filled 2013-09-24: qty 0.5

## 2013-09-24 MED ORDER — OXYCODONE-ACETAMINOPHEN 5-325 MG PO TABS
1.0000 | ORAL_TABLET | Freq: Once | ORAL | Status: AC
  Administered 2013-09-25: 1 via ORAL
  Filled 2013-09-24: qty 1

## 2013-09-24 NOTE — ED Notes (Signed)
Pt has small abrasions noted to both shins. No bleeding at this time.
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
## 2013-09-25 ENCOUNTER — Emergency Department (HOSPITAL_COMMUNITY): Payer: Non-veteran care

## 2013-09-25 MED ORDER — OXYCODONE-ACETAMINOPHEN 5-325 MG PO TABS: 1.0000 | ORAL_TABLET | ORAL | Status: DC | PRN

## 2013-09-25 MED ORDER — OXYCODONE-ACETAMINOPHEN 5-325 MG PO TABS
1.0000 | ORAL_TABLET | Freq: Once | ORAL | Status: AC
  Administered 2013-09-25: 1 via ORAL
  Filled 2013-09-25: qty 1

## 2013-09-25 NOTE — ED Notes (Signed)
Pt back from CT

## 2013-09-25 NOTE — Discharge Instructions (Signed)
Abrasion An abrasion is a cut or scrape of the skin. Abrasions do not extend through all layers of the skin and most heal within 10 days. It is important to care for your abrasion properly to prevent infection. CAUSES  Most abrasions are caused by falling on, or gliding across, the ground or other surface. When your skin rubs on something, the outer and inner layer of skin rubs off, causing an abrasion. DIAGNOSIS  Your caregiver will be able to diagnose an abrasion during a physical exam.  TREATMENT  Your treatment depends on how large and deep the abrasion is. Generally, your abrasion will be cleaned with water and a mild soap to remove any dirt or debris. An antibiotic ointment may be put over the abrasion to prevent an infection. A bandage (dressing) may be wrapped around the abrasion to keep it from getting dirty.  You may need a tetanus shot if:  You cannot remember when you had your last tetanus shot.  You have never had a tetanus shot.  The injury broke your skin. If you get a tetanus shot, your arm may swell, get red, and feel warm to the touch. This is common and not a problem. If you need a tetanus shot and you choose not to have one, there is a rare chance of getting tetanus. Sickness from tetanus can be serious.  HOME CARE INSTRUCTIONS   If a dressing was applied, change it at least once a day or as directed by your caregiver. If the bandage sticks, soak it off with warm water.   Wash the area with water and a mild soap to remove all the ointment 2 times a day. Rinse off the soap and pat the area dry with a clean towel.   Reapply any ointment as directed by your caregiver. This will help prevent infection and keep the bandage from sticking. Use gauze over the wound and under the dressing to help keep the bandage from sticking.   Change your dressing right away if it becomes wet or dirty.   Only take over-the-counter or prescription medicines for pain, discomfort, or fever as  directed by your caregiver.   Follow up with your caregiver within 24 48 hours for a wound check, or as directed. If you were not given a wound-check appointment, look closely at your abrasion for redness, swelling, or pus. These are signs of infection. SEEK IMMEDIATE MEDICAL CARE IF:   You have increasing pain in the wound.   You have redness, swelling, or tenderness around the wound.   You have pus coming from the wound.   You have a fever or persistent symptoms for more than 2 3 days.  You have a fever and your symptoms suddenly get worse.  You have a bad smell coming from the wound or dressing.  MAKE SURE YOU:   Understand these instructions.  Will watch your condition.  Will get help right away if you are not doing well or get worse. Document Released: 05/28/2005 Document Revised: 08/04/2012 Document Reviewed: 07/22/2011 [facility] Patient Information 2014 [place], [place].  Contusion A contusion is a deep bruise. Contusions are the result of an injury that caused bleeding under the skin. The contusion may turn blue, purple, or yellow. Minor injuries will give you a painless contusion, but more severe contusions may stay painful and swollen for a few weeks.  CAUSES  A contusion is usually caused by a blow, trauma, or direct force to an area of the body. SYMPTOMS  Swelling and redness of the injured area.  Bruising of the injured area.  Tenderness and soreness of the injured area.  Pain. DIAGNOSIS  The diagnosis can be made by taking a history and physical exam. An X-ray, CT scan, or MRI may be needed to determine if there were any associated injuries, such as fractures. TREATMENT  Specific treatment will depend on what area of the body was injured. In general, the best treatment for a contusion is resting, icing, elevating, and applying cold compresses to the injured area. Over-the-counter medicines may also be recommended for pain control. Ask your caregiver what  the best treatment is for your contusion. HOME CARE INSTRUCTIONS   Put ice on the injured area.  Put ice in a plastic bag.  Place a towel between your skin and the bag.  Leave the ice on for 15-20 minutes, 03-04 times a day.  Only take over-the-counter or prescription medicines for pain, discomfort, or fever as directed by your caregiver. Your caregiver may recommend avoiding anti-inflammatory medicines (aspirin, ibuprofen, and naproxen) for 48 hours because these medicines may increase bruising.  Rest the injured area.  If possible, elevate the injured area to reduce swelling. SEEK IMMEDIATE MEDICAL CARE IF:   You have increased bruising or swelling.  You have pain that is getting worse.  Your swelling or pain is not relieved with medicines. MAKE SURE YOU:   Understand these instructions.  Will watch your condition.  Will get help right away if you are not doing well or get worse. Document Released: 05/28/2005 Document Revised: 11/10/2011 Document Reviewed: 06/23/2011 [facility] Patient Information 2014 [place], [place].  Tetanus, Diphtheria, Pertussis (Tdap) Vaccine What You Need to Know WHY GET VACCINATED? Tetanus, diphtheria and pertussis can be very serious diseases, even for adolescents and adults. Tdap vaccine can protect [place] from these diseases. TETANUS (Lockjaw) causes painful muscle tightening and stiffness, usually all over the body.  It can lead to tightening of muscles in the head and neck so you can't open your mouth, swallow, or sometimes even breathe. Tetanus kills about 1 out of 5 people who are infected. DIPHTHERIA can cause a thick coating to form in the back of the throat.  It can lead to breathing problems, paralysis, heart failure, and death. PERTUSSIS (Whooping Cough) causes severe coughing spells, which can cause difficulty breathing, vomiting and disturbed sleep.  It can also lead to weight loss, incontinence, and rib fractures. Up to 2 in 100  adolescents and 5 in 100 adults with pertussis are hospitalized or have complications, which could include pneumonia and death. These diseases are caused by bacteria. Diphtheria and pertussis are spread from person to person through coughing or sneezing. Tetanus enters the body through cuts, scratches, or wounds. Before vaccines, the [place] saw as many as 200,000 cases a year of diphtheria and pertussis, and hundreds of cases of tetanus. Since vaccination began, tetanus and diphtheria have dropped by about 99% and pertussis by about 80%. TDAP VACCINE Tdap vaccine can protect adolescents and adults from tetanus, diphtheria, and pertussis. One dose of Tdap is routinely given at age 29 or 13. People who did not get Tdap at that age should get it as soon as possible. Tdap is especially important for health care professionals and anyone having close contact with a baby younger than 12 months. Pregnant women should get a dose of Tdap during every pregnancy, to protect the newborn from pertussis. Infants are most at risk for severe, life-threatening complications from pertussis. A similar  vaccine, called Td, protects from tetanus and diphtheria, but not pertussis. A Td booster should be given every 10 years. Tdap may be given as one of these boosters if you have not already gotten a dose. Tdap may also be given after a severe cut or burn to prevent tetanus infection. Your doctor can give you more information. Tdap may safely be given at the same time as other vaccines. SOME PEOPLE SHOULD NOT GET THIS VACCINE  If you ever had a life-threatening allergic reaction after a dose of any tetanus, diphtheria, or pertussis containing vaccine, OR if you have a severe allergy to any part of this vaccine, you should not get Tdap. Tell your doctor if you have any severe allergies.  If you had a coma, or long or multiple seizures within 7 days after a childhood dose of DTP or DTaP, you should not get Tdap, unless a  cause other than the vaccine was found. You can still get Td.  Talk to your doctor if you:  have epilepsy or another nervous system problem,  had severe pain or swelling after any vaccine containing diphtheria, tetanus or pertussis,  ever had Guillain-Barr Syndrome (GBS),  aren't feeling well on the day the shot is scheduled. RISKS OF A VACCINE REACTION With any medicine, including vaccines, there is a chance of side effects. These are usually mild and go away on their own, but serious reactions are also possible. Brief fainting spells can follow a vaccination, leading to injuries from falling. Sitting or lying down for about 15 minutes can help prevent these. Tell your doctor if you feel dizzy or light-headed, or have vision changes or ringing in the ears. Mild problems following Tdap (Did not interfere with activities)  Pain where the shot was given (about 3 in 4 adolescents or 2 in 3 adults)  Redness or swelling where the shot was given (about 1 person in 5)  Mild fever of at least 100.74F (up to about 1 in 25 adolescents or 1 in 100 adults)  Headache (about 3 or 4 people in 10)  Tiredness (about 1 person in 3 or 4)  Nausea, vomiting, diarrhea, stomach ache (up to 1 in 4 adolescents or 1 in 10 adults)  Chills, body aches, sore joints, rash, swollen glands (uncommon) Moderate problems following Tdap (Interfered with activities, but did not require medical attention)  Pain where the shot was given (about 1 in 5 adolescents or 1 in 100 adults)  Redness or swelling where the shot was given (up to about 1 in 16 adolescents or 1 in 25 adults)  Fever over 102F (about 1 in 100 adolescents or 1 in 250 adults)  Headache (about 3 in 20 adolescents or 1 in 10 adults)  Nausea, vomiting, diarrhea, stomach ache (up to 1 or 3 people in 100)  Swelling of the entire arm where the shot was given (up to about 3 in 100). Severe problems following Tdap (Unable to perform usual activities,  required medical attention)  Swelling, severe pain, bleeding and redness in the arm where the shot was given (rare). A severe allergic reaction could occur after any vaccine (estimated less than 1 in a million doses). WHAT IF THERE IS A SERIOUS REACTION? What should I look for?  Look for anything that concerns you, such as signs of a severe allergic reaction, very high fever, or behavior changes. Signs of a severe allergic reaction can include hives, swelling of the face and throat, difficulty breathing, a fast heartbeat, dizziness,  and weakness. These would start a few minutes to a few hours after the vaccination. What should I do?  If you think it is a severe allergic reaction or other emergency that can't wait, call 9-1-1 or get the person to the nearest hospital. Otherwise, call your doctor.  Afterward, the reaction should be reported to the "Vaccine Adverse Event Reporting System" (VAERS). Your doctor might file this report, or you can do it yourself through the VAERS web site at www.vaers.SamedayNews.es, or by calling 3328639397. VAERS is only for reporting reactions. They do not give medical advice.  THE NATIONAL VACCINE INJURY COMPENSATION PROGRAM The National Vaccine Injury Compensation Program (VICP) is a federal program that was created to compensate people who may have been injured by certain vaccines. Persons who believe they may have been injured by a vaccine can learn about the program and about filing a claim by calling 209-742-8542 or visiting the [place] website at GoldCloset.com.ee. HOW CAN I LEARN MORE?  Ask your doctor.  Call your local or state health department.  Contact the Centers for Disease Control and Prevention (CDC):  Call (619)611-2177 or visit CDC's website at http://hunter.com/. CDC Tdap Vaccine VIS (01/08/12) Document Released: 02/17/2012 Document Revised: 12/13/2012 Document Reviewed: 12/08/2012 [facility] Patient Information 2014 [place],  [place].  Acetaminophen; Oxycodone tablets What is this medicine? ACETAMINOPHEN; OXYCODONE (a set a MEE noe fen; ox i KOE done) is a pain reliever. It is used to treat mild to moderate pain. This medicine may be used for other purposes; ask your health care provider or pharmacist if you have questions. COMMON BRAND NAME(S): Endocet, Magnacet, Narvox, Percocet, Perloxx, Primalev, Primlev, Roxicet, Xolox What should I tell my health care provider before I take this medicine? They need to know if you have any of these conditions: -brain tumor -Crohn's disease, inflammatory bowel disease, or ulcerative colitis -drug abuse or addiction -head injury -heart or circulation problems -if you often drink alcohol -kidney disease or problems going to the bathroom -liver disease -lung disease, asthma, or breathing problems -an unusual or allergic reaction to acetaminophen, oxycodone, other opioid analgesics, other medicines, foods, dyes, or preservatives -pregnant or trying to get pregnant -breast-feeding How should I use this medicine? Take this medicine by mouth with a full glass of water. Follow the directions on the prescription label. Take your medicine at regular intervals. Do not take your medicine more often than directed. Talk to your pediatrician regarding the use of this medicine in children. Special care may be needed. Patients over 57 years old may have a stronger reaction and need a smaller dose. Overdosage: If you think you have taken too much of this medicine contact a poison control center or emergency room at once. NOTE: This medicine is only for you. Do not share this medicine with others. What if I miss a dose? If you miss a dose, take it as soon as you can. If it is almost time for your next dose, take only that dose. Do not take double or extra doses. What may interact with this medicine? -alcohol -antihistamines -barbiturates like amobarbital, butalbital, butabarbital,  methohexital, pentobarbital, phenobarbital, thiopental, and secobarbital -benztropine -drugs for bladder problems like solifenacin, trospium, oxybutynin, tolterodine, hyoscyamine, and methscopolamine -drugs for breathing problems like ipratropium and tiotropium -drugs for certain stomach or intestine problems like propantheline, homatropine methylbromide, glycopyrrolate, atropine, belladonna, and dicyclomine -general anesthetics like etomidate, ketamine, nitrous oxide, propofol, desflurane, enflurane, halothane, isoflurane, and sevoflurane -medicines for depression, anxiety, or psychotic disturbances -medicines for sleep -muscle relaxants -  naltrexone -narcotic medicines (opiates) for pain -phenothiazines like perphenazine, thioridazine, chlorpromazine, mesoridazine, fluphenazine, prochlorperazine, promazine, and trifluoperazine -scopolamine -tramadol -trihexyphenidyl This list may not describe all possible interactions. Give your health care provider a list of all the medicines, herbs, non-prescription drugs, or dietary supplements you use. Also tell them if you smoke, drink alcohol, or use illegal drugs. Some items may interact with your medicine. What should I watch for while using this medicine? Tell your doctor or health care professional if your pain does not go away, if it gets worse, or if you have new or a different type of pain. You may develop tolerance to the medicine. Tolerance means that you will need a higher dose of the medication for pain relief. Tolerance is normal and is expected if you take this medicine for a long time. Do not suddenly stop taking your medicine because you may develop a severe reaction. Your body becomes used to the medicine. This does NOT mean you are addicted. Addiction is a behavior related to getting and using a drug for a non-medical reason. If you have pain, you have a medical reason to take pain medicine. Your doctor will tell you how much medicine to  take. If your doctor wants you to stop the medicine, the dose will be slowly lowered over time to avoid any side effects. You may get drowsy or dizzy. Do not drive, use machinery, or do anything that needs mental alertness until you know how this medicine affects you. Do not stand or sit up quickly, especially if you are an older patient. This reduces the risk of dizzy or fainting spells. Alcohol may interfere with the effect of this medicine. Avoid alcoholic drinks. There are different types of narcotic medicines (opiates) for pain. If you take more than one type at the same time, you may have more side effects. Give your health care provider a list of all medicines you use. Your doctor will tell you how much medicine to take. Do not take more medicine than directed. Call emergency for help if you have problems breathing. The medicine will cause constipation. Try to have a bowel movement at least every 2 to 3 days. If you do not have a bowel movement for 3 days, call your doctor or health care professional. Do not take Tylenol (acetaminophen) or medicines that have acetaminophen with this medicine. Too much acetaminophen can be very dangerous. Many nonprescription medicines contain acetaminophen. Always read the labels carefully to avoid taking more acetaminophen. What side effects may I notice from receiving this medicine? Side effects that you should report to your doctor or health care professional as soon as possible: -allergic reactions like skin rash, itching or hives, swelling of the face, lips, or tongue -breathing difficulties, wheezing -confusion -light headedness or fainting spells -severe stomach pain -unusually weak or tired -yellowing of the skin or the whites of the eyes  Side effects that usually do not require medical attention (report to your doctor or health care professional if they continue or are bothersome): -dizziness -drowsiness -nausea -vomiting This list may not  describe all possible side effects. Call your doctor for medical advice about side effects. You may report side effects to FDA at 1-800-FDA-1088. Where should I keep my medicine? Keep out of the reach of children. This medicine can be abused. Keep your medicine in a safe place to protect it from theft. Do not share this medicine with anyone. Selling or giving away this medicine is dangerous and against the law.  Store at room temperature between 20 and 25 degrees C (68 and 77 degrees F). Keep container tightly closed. Protect from light. This medicine may cause accidental overdose and death if it is taken by other adults, children, or pets. Flush any unused medicine down the toilet to reduce the chance of harm. Do not use the medicine after the expiration date. NOTE: This sheet is a summary. It may not cover all possible information. If you have questions about this medicine, talk to your doctor, pharmacist, or health care provider.  2014, [employer]. (2013-04-11 13:17:35)

## 2013-09-25 NOTE — ED Notes (Signed)
Pt transported to xray 

## 2013-09-30 ENCOUNTER — Encounter (HOSPITAL_COMMUNITY): Payer: Self-pay | Admitting: Emergency Medicine

## 2013-09-30 DIAGNOSIS — Z79899 Other long term (current) drug therapy: Secondary | ICD-10-CM

## 2013-09-30 DIAGNOSIS — I509 Heart failure, unspecified: Secondary | ICD-10-CM | POA: Diagnosis present

## 2013-09-30 DIAGNOSIS — I5033 Acute on chronic diastolic (congestive) heart failure: Secondary | ICD-10-CM | POA: Diagnosis present

## 2013-09-30 DIAGNOSIS — N179 Acute kidney failure, unspecified: Secondary | ICD-10-CM | POA: Diagnosis present

## 2013-09-30 DIAGNOSIS — E119 Type 2 diabetes mellitus without complications: Secondary | ICD-10-CM | POA: Diagnosis present

## 2013-09-30 DIAGNOSIS — Z7982 Long term (current) use of aspirin: Secondary | ICD-10-CM

## 2013-09-30 DIAGNOSIS — Z903 Acquired absence of stomach [part of]: Secondary | ICD-10-CM

## 2013-09-30 DIAGNOSIS — I440 Atrioventricular block, first degree: Secondary | ICD-10-CM | POA: Diagnosis present

## 2013-09-30 DIAGNOSIS — E876 Hypokalemia: Secondary | ICD-10-CM | POA: Diagnosis present

## 2013-09-30 DIAGNOSIS — I130 Hypertensive heart and chronic kidney disease with heart failure and stage 1 through stage 4 chronic kidney disease, or unspecified chronic kidney disease: Principal | ICD-10-CM | POA: Diagnosis present

## 2013-09-30 DIAGNOSIS — C8589 Other specified types of non-Hodgkin lymphoma, extranodal and solid organ sites: Secondary | ICD-10-CM | POA: Diagnosis present

## 2013-09-30 DIAGNOSIS — E7800 Pure hypercholesterolemia, unspecified: Secondary | ICD-10-CM | POA: Diagnosis present

## 2013-09-30 DIAGNOSIS — M109 Gout, unspecified: Secondary | ICD-10-CM | POA: Diagnosis present

## 2013-09-30 DIAGNOSIS — N189 Chronic kidney disease, unspecified: Secondary | ICD-10-CM | POA: Diagnosis present
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
## 2013-10-01 ENCOUNTER — Inpatient Hospital Stay (HOSPITAL_COMMUNITY)
Admission: EM | Admit: 2013-10-01 | Discharge: 2013-10-05 | DRG: 291 | Disposition: A | Discharge status: Home Health | Payer: Medicare Other | Attending: Internal Medicine | Admitting: Internal Medicine

## 2013-10-01 ENCOUNTER — Encounter (HOSPITAL_COMMUNITY): Payer: Self-pay | Admitting: *Deleted

## 2013-10-01 ENCOUNTER — Emergency Department (HOSPITAL_COMMUNITY): Payer: Medicare Other

## 2013-10-01 DIAGNOSIS — G8929 Other chronic pain: Secondary | ICD-10-CM

## 2013-10-01 DIAGNOSIS — D649 Anemia, unspecified: Secondary | ICD-10-CM

## 2013-10-01 DIAGNOSIS — I369 Nonrheumatic tricuspid valve disorder, unspecified: Secondary | ICD-10-CM

## 2013-10-01 DIAGNOSIS — R1310 Dysphagia, unspecified: Secondary | ICD-10-CM

## 2013-10-01 DIAGNOSIS — N189 Chronic kidney disease, unspecified: Secondary | ICD-10-CM

## 2013-10-01 DIAGNOSIS — N183 Chronic kidney disease, stage 3 unspecified: Secondary | ICD-10-CM

## 2013-10-01 DIAGNOSIS — M549 Dorsalgia, unspecified: Secondary | ICD-10-CM

## 2013-10-01 DIAGNOSIS — M79609 Pain in unspecified limb: Secondary | ICD-10-CM

## 2013-10-01 DIAGNOSIS — I10 Essential (primary) hypertension: Secondary | ICD-10-CM

## 2013-10-01 DIAGNOSIS — E876 Hypokalemia: Secondary | ICD-10-CM

## 2013-10-01 DIAGNOSIS — M1990 Unspecified osteoarthritis, unspecified site: Secondary | ICD-10-CM

## 2013-10-01 DIAGNOSIS — I440 Atrioventricular block, first degree: Secondary | ICD-10-CM

## 2013-10-01 DIAGNOSIS — I5033 Acute on chronic diastolic (congestive) heart failure: Secondary | ICD-10-CM

## 2013-10-01 DIAGNOSIS — R55 Syncope and collapse: Secondary | ICD-10-CM

## 2013-10-01 DIAGNOSIS — E119 Type 2 diabetes mellitus without complications: Secondary | ICD-10-CM

## 2013-10-01 DIAGNOSIS — I5032 Chronic diastolic (congestive) heart failure: Secondary | ICD-10-CM

## 2013-10-01 DIAGNOSIS — N200 Calculus of kidney: Secondary | ICD-10-CM

## 2013-10-01 DIAGNOSIS — N179 Acute kidney failure, unspecified: Secondary | ICD-10-CM

## 2013-10-01 DIAGNOSIS — M7989 Other specified soft tissue disorders: Secondary | ICD-10-CM

## 2013-10-01 DIAGNOSIS — I509 Heart failure, unspecified: Secondary | ICD-10-CM

## 2013-10-01 DIAGNOSIS — I5082 Biventricular heart failure: Secondary | ICD-10-CM

## 2013-10-01 DIAGNOSIS — I1310 Hypertensive heart and chronic kidney disease without heart failure, with stage 1 through stage 4 chronic kidney disease, or unspecified chronic kidney disease: Secondary | ICD-10-CM

## 2013-10-01 DIAGNOSIS — M109 Gout, unspecified: Secondary | ICD-10-CM

## 2013-10-01 DIAGNOSIS — I50810 Right heart failure, unspecified: Secondary | ICD-10-CM

## 2013-10-01 LAB — CBC WITH DIFFERENTIAL/PLATELET
BASOS ABS: 0.1 10*3/uL (ref 0.0–0.1)
Basophils Relative: 1 % (ref 0–1)
Eosinophils Absolute: 0.2 10*3/uL (ref 0.0–0.7)
Eosinophils Relative: 3 % (ref 0–5)
HCT: 29.7 % — ABNORMAL LOW (ref 39.0–52.0)
HEMOGLOBIN: 10.1 g/dL — AB (ref 13.0–17.0)
LYMPHS PCT: 27 % (ref 12–46)
Lymphs Abs: 2.1 10*3/uL (ref 0.7–4.0)
MCH: 30.1 pg (ref 26.0–34.0)
MCHC: 34 g/dL (ref 30.0–36.0)
MCV: 88.7 fL (ref 78.0–100.0)
Monocytes Absolute: 0.7 10*3/uL (ref 0.1–1.0)
Monocytes Relative: 9 % (ref 3–12)
NEUTROS ABS: 4.6 10*3/uL (ref 1.7–7.7)
Neutrophils Relative %: 60 % (ref 43–77)
Platelets: 228 10*3/uL (ref 150–400)
RBC: 3.35 MIL/uL — AB (ref 4.22–5.81)
RDW: 14.6 % (ref 11.5–15.5)
WBC: 7.7 10*3/uL (ref 4.0–10.5)

## 2013-10-01 LAB — GLUCOSE, CAPILLARY
GLUCOSE-CAPILLARY: 167 mg/dL — AB (ref 70–99)
GLUCOSE-CAPILLARY: 172 mg/dL — AB (ref 70–99)
Glucose-Capillary: 140 mg/dL — ABNORMAL HIGH (ref 70–99)
Glucose-Capillary: 110 mg/dL — ABNORMAL HIGH (ref 70–99)

## 2013-10-01 LAB — BASIC METABOLIC PANEL
BUN: 56 mg/dL — ABNORMAL HIGH (ref 6–23)
CALCIUM: 8.9 mg/dL (ref 8.4–10.5)
CHLORIDE: 94 meq/L — AB (ref 96–112)
CO2: 25 meq/L (ref 19–32)
CREATININE: 1.95 mg/dL — AB (ref 0.50–1.35)
GFR calc Af Amer: 36 mL/min — ABNORMAL LOW (ref >90)
GFR calc non Af Amer: 31 mL/min — ABNORMAL LOW (ref >90)
GLUCOSE: 172 mg/dL — AB (ref 70–99)
Potassium: 3 mEq/L — ABNORMAL LOW (ref 3.7–5.3)
Sodium: 135 mEq/L — ABNORMAL LOW (ref 137–147)

## 2013-10-01 LAB — HEMOGLOBIN A1C
Hgb A1c MFr Bld: 6.4 % — ABNORMAL HIGH (ref <5.7)
MEAN PLASMA GLUCOSE: 137 mg/dL — AB (ref <117)

## 2013-10-01 LAB — TROPONIN I
Troponin I: <0.3 ng/mL (ref <0.30)
Troponin I: <0.3 ng/mL (ref <0.30)

## 2013-10-01 LAB — PRO B NATRIURETIC PEPTIDE: Pro B Natriuretic peptide (BNP): 2295 pg/mL — ABNORMAL HIGH (ref 0–450)

## 2013-10-01 LAB — D-DIMER, QUANTITATIVE: D-Dimer, Quant: 0.77 ug/mL-FEU — ABNORMAL HIGH (ref 0.00–0.48)

## 2013-10-01 MED ORDER — ALBUTEROL SULFATE (2.5 MG/3ML) 0.083% IN NEBU: 2.5000 mg | INHALATION_SOLUTION | Freq: Four times a day (QID) | RESPIRATORY_TRACT | Status: DC | PRN

## 2013-10-01 MED ORDER — POTASSIUM CHLORIDE CRYS ER 20 MEQ PO TBCR
40.0000 meq | EXTENDED_RELEASE_TABLET | Freq: Once | ORAL | Status: AC
  Administered 2013-10-01: 40 meq via ORAL
  Filled 2013-10-01: qty 2

## 2013-10-01 MED ORDER — ASPIRIN 81 MG PO CHEW
324.0000 mg | CHEWABLE_TABLET | Freq: Once | ORAL | Status: AC
Start: 2013-10-01 — End: 2013-10-01
  Administered 2013-10-01: 324 mg via ORAL
  Filled 2013-10-01: qty 4

## 2013-10-01 MED ORDER — ASPIRIN EC 81 MG PO TBEC
81.0000 mg | DELAYED_RELEASE_TABLET | Freq: Every day | ORAL | Status: DC
Start: 2013-10-01 — End: 2013-10-05
  Administered 2013-10-01 – 2013-10-05 (×5): 81 mg via ORAL
  Filled 2013-10-01 (×5): qty 1

## 2013-10-01 MED ORDER — DOCUSATE SODIUM 100 MG PO CAPS
100.0000 mg | ORAL_CAPSULE | Freq: Two times a day (BID) | ORAL | Status: DC
  Administered 2013-10-01 – 2013-10-05 (×7): 100 mg via ORAL
  Filled 2013-10-01 (×11): qty 1

## 2013-10-01 MED ORDER — TRAZODONE HCL 50 MG PO TABS
50.0000 mg | ORAL_TABLET | Freq: Every day | ORAL | Status: DC
  Administered 2013-10-01 – 2013-10-04 (×4): 50 mg via ORAL
  Filled 2013-10-01 (×5): qty 1

## 2013-10-01 MED ORDER — ONDANSETRON HCL 4 MG/2ML IJ SOLN
4.0000 mg | Freq: Once | INTRAMUSCULAR | Status: AC
  Administered 2013-10-01: 4 mg via INTRAVENOUS
  Filled 2013-10-01: qty 2

## 2013-10-01 MED ORDER — OXYCODONE-ACETAMINOPHEN 5-325 MG PO TABS
1.0000 | ORAL_TABLET | ORAL | Status: DC | PRN
  Administered 2013-10-01 (×2): 1 via ORAL
  Administered 2013-10-01: 2 via ORAL
  Administered 2013-10-02 – 2013-10-04 (×9): 1 via ORAL
  Administered 2013-10-05: 2 via ORAL
  Filled 2013-10-01: qty 2
  Filled 2013-10-01 (×6): qty 1
  Filled 2013-10-01: qty 2
  Filled 2013-10-01 (×5): qty 1

## 2013-10-01 MED ORDER — INSULIN ASPART 100 UNIT/ML ~~LOC~~ SOLN: 0.0000 [IU] | Freq: Every day | SUBCUTANEOUS | Status: DC

## 2013-10-01 MED ORDER — SERTRALINE HCL 100 MG PO TABS
200.0000 mg | ORAL_TABLET | Freq: Every day | ORAL | Status: DC
  Administered 2013-10-01 – 2013-10-04 (×4): 200 mg via ORAL
  Filled 2013-10-01 (×5): qty 2

## 2013-10-01 MED ORDER — SIMVASTATIN 10 MG PO TABS
10.0000 mg | ORAL_TABLET | Freq: Every day | ORAL | Status: DC
  Administered 2013-10-01 – 2013-10-04 (×4): 10 mg via ORAL
  Filled 2013-10-01 (×5): qty 1

## 2013-10-01 MED ORDER — ONDANSETRON HCL 4 MG PO TABS: 4.0000 mg | ORAL_TABLET | Freq: Four times a day (QID) | ORAL | Status: DC | PRN

## 2013-10-01 MED ORDER — FUROSEMIDE 10 MG/ML IJ SOLN
60.0000 mg | Freq: Two times a day (BID) | INTRAMUSCULAR | Status: DC
  Administered 2013-10-01: 60 mg via INTRAVENOUS
  Filled 2013-10-01 (×5): qty 6

## 2013-10-01 MED ORDER — GABAPENTIN 100 MG PO CAPS
100.0000 mg | ORAL_CAPSULE | Freq: Two times a day (BID) | ORAL | Status: DC
  Administered 2013-10-01 – 2013-10-05 (×9): 100 mg via ORAL
  Filled 2013-10-01 (×10): qty 1

## 2013-10-01 MED ORDER — HYDRALAZINE HCL 25 MG PO TABS
25.0000 mg | ORAL_TABLET | Freq: Three times a day (TID) | ORAL | Status: DC
  Administered 2013-10-01 – 2013-10-05 (×14): 25 mg via ORAL
  Filled 2013-10-01 (×15): qty 1

## 2013-10-01 MED ORDER — MORPHINE SULFATE 4 MG/ML IJ SOLN
4.0000 mg | Freq: Once | INTRAMUSCULAR | Status: AC
  Administered 2013-10-01: 4 mg via INTRAVENOUS
  Filled 2013-10-01: qty 1

## 2013-10-01 MED ORDER — FUROSEMIDE 10 MG/ML IJ SOLN
80.0000 mg | Freq: Once | INTRAMUSCULAR | Status: AC
  Administered 2013-10-01: 80 mg via INTRAVENOUS
  Filled 2013-10-01: qty 8

## 2013-10-01 MED ORDER — INSULIN ASPART 100 UNIT/ML ~~LOC~~ SOLN
0.0000 [IU] | Freq: Three times a day (TID) | SUBCUTANEOUS | Status: DC
  Administered 2013-10-01: 2 [IU] via SUBCUTANEOUS
  Administered 2013-10-01: 18:00:00 via SUBCUTANEOUS
  Administered 2013-10-02: 2 [IU] via SUBCUTANEOUS
  Administered 2013-10-02: 5 [IU] via SUBCUTANEOUS
  Administered 2013-10-02: 3 [IU] via SUBCUTANEOUS
  Administered 2013-10-03: 2 [IU] via SUBCUTANEOUS
  Administered 2013-10-03: 3 [IU] via SUBCUTANEOUS
  Administered 2013-10-03: 2 [IU] via SUBCUTANEOUS
  Administered 2013-10-04 – 2013-10-05 (×2): 3 [IU] via SUBCUTANEOUS

## 2013-10-01 MED ORDER — POTASSIUM CHLORIDE CRYS ER 20 MEQ PO TBCR
40.0000 meq | EXTENDED_RELEASE_TABLET | Freq: Two times a day (BID) | ORAL | Status: DC
  Administered 2013-10-01 – 2013-10-02 (×4): 40 meq via ORAL
  Filled 2013-10-01 (×5): qty 2

## 2013-10-01 MED ORDER — ONDANSETRON HCL 4 MG/2ML IJ SOLN: 4.0000 mg | Freq: Four times a day (QID) | INTRAMUSCULAR | Status: DC | PRN

## 2013-10-01 MED ORDER — POLYETHYLENE GLYCOL 3350 17 G PO PACK
17.0000 g | PACK | Freq: Every day | ORAL | Status: DC | PRN
  Filled 2013-10-01: qty 1

## 2013-10-01 MED ORDER — HEPARIN SODIUM (PORCINE) 5000 UNIT/ML IJ SOLN
5000.0000 [IU] | Freq: Three times a day (TID) | INTRAMUSCULAR | Status: DC
  Administered 2013-10-01 – 2013-10-05 (×14): 5000 [IU] via SUBCUTANEOUS
  Filled 2013-10-01 (×16): qty 1

## 2013-10-01 MED ORDER — ISOSORBIDE DINITRATE 20 MG PO TABS
20.0000 mg | ORAL_TABLET | Freq: Three times a day (TID) | ORAL | Status: DC
  Administered 2013-10-01 – 2013-10-05 (×14): 20 mg via ORAL
  Filled 2013-10-01 (×15): qty 1

## 2013-10-01 MED ORDER — SODIUM CHLORIDE 0.9 % IJ SOLN
3.0000 mL | Freq: Two times a day (BID) | INTRAMUSCULAR | Status: DC
  Administered 2013-10-01 – 2013-10-05 (×9): 3 mL via INTRAVENOUS

## 2013-10-01 MED ORDER — ALLOPURINOL 100 MG PO TABS
100.0000 mg | ORAL_TABLET | Freq: Every day | ORAL | Status: DC
  Administered 2013-10-01 – 2013-10-05 (×5): 100 mg via ORAL
  Filled 2013-10-01 (×5): qty 1

## 2013-10-01 MED ORDER — CARVEDILOL 12.5 MG PO TABS
12.5000 mg | ORAL_TABLET | Freq: Two times a day (BID) | ORAL | Status: DC
  Administered 2013-10-01 – 2013-10-05 (×9): 12.5 mg via ORAL
  Filled 2013-10-01 (×12): qty 1

## 2013-10-01 NOTE — ED Notes (Signed)
Nurse first Rounds : Pt. assisted to toilet ( urinated ).

## 2013-10-01 NOTE — Progress Notes (Signed)
  Echocardiogram 2D Echocardiogram has been performed.  Diamond Nickel 10/01/2013, 9:42 AM

## 2013-10-01 NOTE — Progress Notes (Signed)
VASCULAR LAB PRELIMINARY  PRELIMINARY  PRELIMINARY  PRELIMINARY  Bilateral lower extremity venous Dopplers completed.    Preliminary report:  There is no DVT or SVT noted in the bilateral lower extremities.  Aloma Boch, RVT 10/01/2013, 9:43 AM
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
## 2013-10-01 NOTE — Progress Notes (Signed)
Feeds self.  Alert and cooperative.  Up with minimal assistance.

## 2013-10-01 NOTE — Progress Notes (Signed)
The patient arrived to 3E14 from the ED at 0650.  He was oriented to the unit and placed on telemetry.  VS were taken and the patient was assessed.  He is A&Ox3.  The call bell was placed within reach.
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
## 2013-10-01 NOTE — Progress Notes (Signed)
Pt was seen and examined this morning.  H&P and orders reviewed.  New orders placed.  Echo pending.  Low threshold for consulting Heart Failure team.  Replace potassium, check BMP.  Monitor I/Os closely and daily weights.  Will follow.  Murvin Natal, MD

## 2013-10-02 DIAGNOSIS — I5032 Chronic diastolic (congestive) heart failure: Secondary | ICD-10-CM

## 2013-10-02 DIAGNOSIS — N183 Chronic kidney disease, stage 3 unspecified: Secondary | ICD-10-CM

## 2013-10-02 LAB — GLUCOSE, CAPILLARY
GLUCOSE-CAPILLARY: 124 mg/dL — AB (ref 70–99)
GLUCOSE-CAPILLARY: 220 mg/dL — AB (ref 70–99)
GLUCOSE-CAPILLARY: 157 mg/dL — AB (ref 70–99)
Glucose-Capillary: 151 mg/dL — ABNORMAL HIGH (ref 70–99)

## 2013-10-02 LAB — BASIC METABOLIC PANEL WITH GFR
BUN: 60 mg/dL — ABNORMAL HIGH (ref 6–23)
CO2: 26 meq/L (ref 19–32)
Calcium: 8.8 mg/dL (ref 8.4–10.5)
Chloride: 98 meq/L (ref 96–112)
Creatinine, Ser: 2.12 mg/dL — ABNORMAL HIGH (ref 0.50–1.35)
GFR calc Af Amer: 32 mL/min — ABNORMAL LOW
GFR calc non Af Amer: 28 mL/min — ABNORMAL LOW
Glucose, Bld: 131 mg/dL — ABNORMAL HIGH (ref 70–99)
Potassium: 4.7 meq/L (ref 3.7–5.3)
Sodium: 138 meq/L (ref 137–147)

## 2013-10-02 LAB — PRO B NATRIURETIC PEPTIDE: PRO B NATRI PEPTIDE: 1567 pg/mL — AB (ref 0–450)

## 2013-10-02 LAB — MAGNESIUM: Magnesium: 1.9 mg/dL (ref 1.5–2.5)

## 2013-10-02 MED ORDER — TORSEMIDE 20 MG PO TABS
20.0000 mg | ORAL_TABLET | Freq: Every day | ORAL | Status: DC
  Administered 2013-10-02 – 2013-10-03 (×2): 20 mg via ORAL
  Filled 2013-10-02 (×3): qty 1

## 2013-10-02 MED ORDER — HYPROMELLOSE (GONIOSCOPIC) 2.5 % OP SOLN
1.0000 [drp] | Freq: Three times a day (TID) | OPHTHALMIC | Status: DC | PRN
  Filled 2013-10-02: qty 15

## 2013-10-02 MED ORDER — POLYVINYL ALCOHOL 1.4 % OP SOLN
1.0000 [drp] | Freq: Three times a day (TID) | OPHTHALMIC | Status: DC | PRN
  Administered 2013-10-02 – 2013-10-04 (×4): 1 [drp] via OPHTHALMIC
  Filled 2013-10-02: qty 15

## 2013-10-02 MED ORDER — CYCLOSPORINE 0.05 % OP EMUL
1.0000 [drp] | Freq: Two times a day (BID) | OPHTHALMIC | Status: DC
  Administered 2013-10-03 – 2013-10-05 (×5): 1 [drp] via OPHTHALMIC
  Filled 2013-10-02 (×8): qty 1
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
## 2013-10-02 NOTE — Progress Notes (Signed)
PHYSICIAN PROGRESS NOTE  [name] OVF:[id number] DOB: [birth year] DOA: 10/01/2013  10/02/2013   PCP: Leola Brazil, MD  Assessment/Plan: 1. Acute on chronic congestive heart failure with right ventricular diastolic dysfunction  The patient presented with complaints of bilateral leg swelling and abdominal distention. He has elevated proBNP and significantly elevated weight. After diuresis with lasix 60 mg IV bid he has less edema in legs.  I will consult the heart failure team to evaluate him as he has been difficult to diurese in the past.  He has minimal JVD. He does not appear to have any pulmonary edema. His symptoms appear to be more signs of right-sided heart failure.  Monitor BMP.   Echocardiogram Study Conclusions: - Left ventricle: The cavity size was normal. Wall thickness was normal. Systolic function was normal. The estimated ejection fraction was in the range of 55% to 60%. Wall motion was normal; there were no regional wall motion abnormalities. Due to first degree atrioventricular block, there was fusion of early and atrial contributions to ventricular filling.  bilateral duplex lower extremity negative for DVT   2. Hypertension  Continuing Coreg, hydralazine, nitrate  Holding amlodipine since it would cause more pedal edema  3. Diabetes mellitus, type 2 Placing the patient on sliding scale holding oral hypoglycemic  Blood glucose readings have been well controlled.  A1c 6.4%   4. Acute on chronic renal failure - Likely secondary to cardiorenal metabolism.  Monitor BMP, avoid nephrotoxic medications   5. Question of paroxysmal atrial fibrillation  - telemetry overnight has picked up a run of afib, will consult cardiology for recommendations, currently his heart rate is sinus and rate is controlled.    DVT Prophylaxis: subcutaneous Heparin   Nutrition: Diabetic and cardiac diet  Code Status: Full  Disposition: Admitted to inpatient in telemetry  unit.  HPI/Subjective: Pt reports that he is having less edema in legs.    Objective: Filed Vitals:   10/02/13 0700  BP: 135/75  Pulse: 75  Temp: 97.8 F (36.6 C)  Resp: 18    Intake/Output Summary (Last 24 hours) at 10/02/13 0818 Last data filed at 10/02/13 0700  Gross per 24 hour  Intake   1360 ml  Output   2150 ml  Net   -790 ml   Filed Weights   10/01/13 0543 10/01/13 0745 10/02/13 0700  Weight: 226 lb 14.4 oz (102.921 kg) 223 lb 4.8 oz (101.288 kg) 228 lb 9.6 oz (103.692 kg)   Exam:   General:  Awake, no apparent distress, cooperative   Cardiovascular: normal s1, s2 sounds   Respiratory: BBS shallow, clear, no wheezes or rales heard   Abdomen: large ventral hernia seen, soft, nontender, normal BS  Musculoskeletal: 1-2+ pitting edema bilateral LEs    Data Reviewed: Basic Metabolic Panel:  Recent Labs Lab 10/01/13 0217 10/02/13 0515  NA 135* 138  K 3.0* 4.7  CL 94* 98  CO2 25 26  GLUCOSE 172* 131*  BUN 56* 60*  CREATININE 1.95* 2.12*  CALCIUM 8.9 8.8  MG  --  1.9   Liver Function Tests: No results found for this basename: AST, ALT, ALKPHOS, BILITOT, PROT, ALBUMIN,  in the last 168 hours No results found for this basename: LIPASE, AMYLASE,  in the last 168 hours No results found for this basename: AMMONIA,  in the last 168 hours CBC:  Recent Labs Lab 10/01/13 0217  WBC 7.7  NEUTROABS 4.6  HGB 10.1*  HCT 29.7*  MCV 88.7  PLT 228  Cardiac Enzymes:  Recent Labs Lab 10/01/13 0217 10/01/13 0900 10/01/13 1425 10/01/13 2030  TROPONINI <0.30 <0.30 <0.30 <0.30   BNP (last 3 results)  Recent Labs  07/12/13 1356 10/01/13 0217 10/02/13 0515  PROBNP 904.2* 2295.0* 1567.0*   CBG:  Recent Labs Lab 10/01/13 0706 10/01/13 1106 10/01/13 1629 10/01/13 2104 10/02/13 0555  GLUCAP 140* 110* 167* 172* 124*    No results found for this or any previous visit (from the past 240 hour(s)).   Studies: Dg Chest 2 View  10/01/2013    CLINICAL DATA:  Lower extremity edema.  EXAM: CHEST  2 VIEW  COMPARISON:  Chest radiograph September 25, 2013  FINDINGS: Cardiac silhouette appears upper limits of normal, tortuous and possibly ectatic aorta is unchanged. Mild chronic interstitial changes without pleural effusions or focal consolidations. Increased lung volumes. No pneumothorax.  Status post right humeral arthroplasty. Severe degenerative change of the left shoulder. Mild degenerative change of thoracolumbar spine.  IMPRESSION: Borderline cardiomegaly and mild COPD without acute pulmonary process.   Electronically Signed   By: Elon Alas   On: 10/01/2013 03:24   Scheduled Meds: . allopurinol  100 mg Oral Daily  . aspirin EC  81 mg Oral Daily  . carvedilol  12.5 mg Oral BID WC  . docusate sodium  100 mg Oral Q12H  . gabapentin  100 mg Oral BID  . heparin  5,000 Units Subcutaneous Q8H  . hydrALAZINE  25 mg Oral TID  . insulin aspart  0-15 Units Subcutaneous TID WC  . insulin aspart  0-5 Units Subcutaneous QHS  . isosorbide dinitrate  20 mg Oral TID  . potassium chloride  40 mEq Oral BID  . sertraline  200 mg Oral QHS  . simvastatin  10 mg Oral QHS  . sodium chloride  3 mL Intravenous Q12H  . torsemide  20 mg Oral Daily  . traZODone  50 mg Oral QHS   Continuous Infusions:   Principal Problem:   Acute on chronic congestive heart failure with right ventricular diastolic dysfunction Active Problems:   HTN (hypertension)   Diabetes mellitus   Cardiorenal syndrome with renal failure   Acute on chronic renal failure  [facility] Pager [phone]. If 7PM-7AM, please contact night-coverage at [email or web address], password [facility] 10/02/2013, 8:18 AM  LOS: 1 day

## 2013-10-02 NOTE — Progress Notes (Signed)
Up on side of bed.  Needs assistance with ADL skills.  Speech is difficult to understand, but, can be communicated with in a timely manner.

## 2013-10-03 DIAGNOSIS — I509 Heart failure, unspecified: Secondary | ICD-10-CM

## 2013-10-03 DIAGNOSIS — I440 Atrioventricular block, first degree: Secondary | ICD-10-CM

## 2013-10-03 LAB — BASIC METABOLIC PANEL
BUN: 61 mg/dL — AB (ref 6–23)
CO2: 27 mEq/L (ref 19–32)
Calcium: 8.9 mg/dL (ref 8.4–10.5)
Chloride: 100 mEq/L (ref 96–112)
Creatinine, Ser: 2.09 mg/dL — ABNORMAL HIGH (ref 0.50–1.35)
GFR calc non Af Amer: 28 mL/min — ABNORMAL LOW (ref >90)
GFR, EST AFRICAN AMERICAN: 33 mL/min — AB (ref >90)
GLUCOSE: 111 mg/dL — AB (ref 70–99)
POTASSIUM: 5.3 meq/L (ref 3.7–5.3)
Sodium: 140 mEq/L (ref 137–147)

## 2013-10-03 LAB — GLUCOSE, CAPILLARY
GLUCOSE-CAPILLARY: 123 mg/dL — AB (ref 70–99)
Glucose-Capillary: 131 mg/dL — ABNORMAL HIGH (ref 70–99)
Glucose-Capillary: 179 mg/dL — ABNORMAL HIGH (ref 70–99)
Glucose-Capillary: 133 mg/dL — ABNORMAL HIGH (ref 70–99)

## 2013-10-03 LAB — OCCULT BLOOD X 1 CARD TO LAB, STOOL: Fecal Occult Bld: NEGATIVE

## 2013-10-03 LAB — TSH: TSH: 3.032 u[IU]/mL (ref 0.350–4.500)

## 2013-10-03 MED ORDER — POTASSIUM CHLORIDE CRYS ER 20 MEQ PO TBCR
20.0000 meq | EXTENDED_RELEASE_TABLET | Freq: Every day | ORAL | Status: DC
  Administered 2013-10-04: 20 meq via ORAL
  Filled 2013-10-03: qty 1

## 2013-10-03 MED ORDER — POTASSIUM CHLORIDE CRYS ER 20 MEQ PO TBCR: 20.0000 meq | EXTENDED_RELEASE_TABLET | Freq: Two times a day (BID) | ORAL | Status: DC

## 2013-10-03 MED ORDER — METOLAZONE 5 MG PO TABS
5.0000 mg | ORAL_TABLET | Freq: Once | ORAL | Status: AC
  Administered 2013-10-03: 5 mg via ORAL
  Filled 2013-10-03 (×2): qty 1

## 2013-10-03 MED ORDER — FUROSEMIDE 10 MG/ML IJ SOLN
80.0000 mg | Freq: Once | INTRAMUSCULAR | Status: AC
  Administered 2013-10-03: 80 mg via INTRAVENOUS
  Filled 2013-10-03: qty 8

## 2013-10-03 NOTE — Progress Notes (Signed)
PHYSICIAN PROGRESS NOTE  GEVORK AYYAD [id number]:[id number] DOB: 29-May-1932 DOA: 10/01/2013  10/03/2013   PCP: Leola Brazil, MD  Assessment/Plan: 1. Acute on chronic congestive heart failure with right ventricular diastolic dysfunction  The patient presented with complaints of bilateral leg swelling and abdominal distention. He has elevated proBNP and significantly elevated weight. After diuresis with lasix 60 mg IV bid he has less edema in legs.  He is now back on home demadex dose. I will consult the heart failure team to evaluate him as well. He has minimal JVD. He does not appear to have any pulmonary edema. His symptoms appear to be more signs of right-sided heart failure.  Monitor BMP.   Echocardiogram Study Conclusions: - Left ventricle: The cavity size was normal. Wall thickness was normal. Systolic function was normal. The estimated ejection fraction was in the range of 55% to 60%. Wall motion was normal; there were no regional wall motion abnormalities. Due to first degree atrioventricular block, there was fusion of early and atrial contributions to ventricular filling.  bilateral duplex lower extremity negative for DVT   2. Hypertension  Continuing Coreg, hydralazine, nitrate  Holding amlodipine since it would cause more pedal edema  3. Diabetes mellitus, type 2  Placing the patient on sliding scale holding oral hypoglycemic  Blood glucose readings have been well controlled.  A1c 6.4%    4. Chronic renal failure - Likely secondary to cardiorenal metabolism.  Monitor BMP, avoid nephrotoxic medications   5. Question of paroxysmal atrial fibrillation  - telemetry overnight has picked up a run of afib, will consult cardiology for recommendations, currently his heart rate is sinus and rate is controlled.   DVT Prophylaxis: subcutaneous Heparin  Nutrition: Diabetic and cardiac diet   Code Status: Full  Disposition: Admitted to inpatient in telemetry  unit.  HPI/Subjective: Pt reports that he is feeling much better.    Objective: Filed Vitals:   10/03/13 0607  BP: 125/83  Pulse: 68  Temp: 97.9 F (36.6 C)  Resp: 18    Intake/Output Summary (Last 24 hours) at 10/03/13 0918 Last data filed at 10/03/13 0855  Gross per 24 hour  Intake   1542 ml  Output   1575 ml  Net    -33 ml   Filed Weights   10/01/13 0745 10/02/13 0700 10/03/13 0607  Weight: 223 lb 4.8 oz (101.288 kg) 228 lb 9.6 oz (103.692 kg) 225 lb (102.059 kg)   Exam:  General: Awake, no apparent distress, cooperative  Cardiovascular: normal s1, s2 sounds  Respiratory: BBS shallow, clear, no wheezes or rales heard  Abdomen: large ventral hernia seen, soft, nontender, normal BS  Musculoskeletal: 1+ pitting edema bilateral LEs    Data Reviewed: Basic Metabolic Panel:  Recent Labs Lab 10/01/13 0217 10/02/13 0515 10/03/13 0505  NA 135* 138 140  K 3.0* 4.7 5.3  CL 94* 98 100  CO2 25 26 27   GLUCOSE 172* 131* 111*  BUN 56* 60* 61*  CREATININE 1.95* 2.12* 2.09*  CALCIUM 8.9 8.8 8.9  MG  --  1.9  --    Liver Function Tests: No results found for this basename: AST, ALT, ALKPHOS, BILITOT, PROT, ALBUMIN,  in the last 168 hours No results found for this basename: LIPASE, AMYLASE,  in the last 168 hours No results found for this basename: AMMONIA,  in the last 168 hours CBC:  Recent Labs Lab 10/01/13 0217  WBC 7.7  NEUTROABS 4.6  HGB 10.1*  HCT 29.7*  MCV 88.7  PLT 228   Cardiac Enzymes:  Recent Labs Lab 10/01/13 0217 10/01/13 0900 10/01/13 1425 10/01/13 2030  TROPONINI <0.30 <0.30 <0.30 <0.30   BNP (last 3 results)  Recent Labs  07/12/13 1356 10/01/13 0217 10/02/13 0515  PROBNP 904.2* 2295.0* 1567.0*   CBG:  Recent Labs Lab 10/02/13 0555 10/02/13 1057 10/02/13 1614 10/02/13 2202 10/03/13 0701  GLUCAP 124* 220* 151* 157* 131*    No results found for this or any previous visit (from the past 240 hour(s)).   Studies: No  results found.  Scheduled Meds: . allopurinol  100 mg Oral Daily  . aspirin EC  81 mg Oral Daily  . carvedilol  12.5 mg Oral BID WC  . cycloSPORINE  1 drop Both Eyes BID  . docusate sodium  100 mg Oral Q12H  . gabapentin  100 mg Oral BID  . heparin  5,000 Units Subcutaneous Q8H  . hydrALAZINE  25 mg Oral TID  . insulin aspart  0-15 Units Subcutaneous TID WC  . insulin aspart  0-5 Units Subcutaneous QHS  . isosorbide dinitrate  20 mg Oral TID  . potassium chloride  40 mEq Oral BID  . sertraline  200 mg Oral QHS  . simvastatin  10 mg Oral QHS  . sodium chloride  3 mL Intravenous Q12H  . torsemide  20 mg Oral Daily  . traZODone  50 mg Oral QHS   Continuous Infusions:   Principal Problem:   Acute on chronic congestive heart failure with right ventricular diastolic dysfunction Active Problems:   HTN (hypertension)   Diabetes mellitus   Cardiorenal syndrome with renal failure   Acute on chronic renal failure  [facility] Pager [phone]. If 7PM-7AM, please contact night-coverage at [email or web address], password [facility] 10/03/2013, 9:18 AM  LOS: 2 days
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
## 2013-10-04 LAB — BASIC METABOLIC PANEL
BUN: 64 mg/dL — AB (ref 6–23)
CO2: 25 mEq/L (ref 19–32)
Calcium: 9 mg/dL (ref 8.4–10.5)
Chloride: 95 mEq/L — ABNORMAL LOW (ref 96–112)
Creatinine, Ser: 2.04 mg/dL — ABNORMAL HIGH (ref 0.50–1.35)
GFR calc Af Amer: 34 mL/min — ABNORMAL LOW (ref >90)
GFR, EST NON AFRICAN AMERICAN: 29 mL/min — AB (ref >90)
GLUCOSE: 127 mg/dL — AB (ref 70–99)
Potassium: 3.9 mEq/L (ref 3.7–5.3)
Sodium: 136 mEq/L — ABNORMAL LOW (ref 137–147)

## 2013-10-04 LAB — GLUCOSE, CAPILLARY
GLUCOSE-CAPILLARY: 118 mg/dL — AB (ref 70–99)
Glucose-Capillary: 117 mg/dL — ABNORMAL HIGH (ref 70–99)
Glucose-Capillary: 119 mg/dL — ABNORMAL HIGH (ref 70–99)
Glucose-Capillary: 169 mg/dL — ABNORMAL HIGH (ref 70–99)

## 2013-10-04 MED ORDER — METOLAZONE 5 MG PO TABS
5.0000 mg | ORAL_TABLET | Freq: Every day | ORAL | Status: DC
  Administered 2013-10-04: 5 mg via ORAL
  Filled 2013-10-04 (×2): qty 1

## 2013-10-04 MED ORDER — FUROSEMIDE 10 MG/ML IJ SOLN
80.0000 mg | Freq: Once | INTRAMUSCULAR | Status: AC
  Administered 2013-10-04: 80 mg via INTRAVENOUS
  Filled 2013-10-04: qty 8

## 2013-10-04 MED ORDER — INFLUENZA VAC SPLIT QUAD 0.5 ML IM SUSP
0.5000 mL | INTRAMUSCULAR | Status: AC
  Administered 2013-10-05: 0.5 mL via INTRAMUSCULAR
  Filled 2013-10-04: qty 0.5

## 2013-10-04 MED ORDER — POTASSIUM CHLORIDE CRYS ER 10 MEQ PO TBCR
30.0000 meq | EXTENDED_RELEASE_TABLET | Freq: Two times a day (BID) | ORAL | Status: DC
  Administered 2013-10-04 – 2013-10-05 (×2): 30 meq via ORAL
  Filled 2013-10-04 (×3): qty 1

## 2013-10-04 MED ORDER — POTASSIUM CHLORIDE CRYS ER 10 MEQ PO TBCR
10.0000 meq | EXTENDED_RELEASE_TABLET | Freq: Once | ORAL | Status: AC
Start: 2013-10-04 — End: 2013-10-04
  Administered 2013-10-04: 10 meq via ORAL
  Filled 2013-10-04: qty 1

## 2013-10-04 NOTE — Progress Notes (Signed)
PHYSICIAN PROGRESS NOTE  [name] WUJ:[id number] DOB: July 26, 1932 DOA: 10/01/2013  10/04/2013   PCP: Leola Brazil, MD   Assessment/Plan: 1. Acute on chronic congestive heart failure with right ventricular diastolic dysfunction  The patient presented with complaints of bilateral leg swelling and abdominal distention. He has elevated proBNP and significantly elevated weight. After diuresis with lasix 60 mg IV bid he has less edema in legs. He is now back on home demadex dose. I will consult the heart failure team to evaluate him as well. He has minimal JVD. He does not appear to have any pulmonary edema. His symptoms appear to be more signs of right-sided heart failure.  Monitor BMP.  Pt is down 10 pounds now. Diuresed 2.5 L.   Echocardiogram Study Conclusions: - Left ventricle: The cavity size was normal. Wall thickness was normal. Systolic function was normal. The estimated ejection fraction was in the range of 55% to 60%. Wall motion was normal; there were no regional wall motion abnormalities. Due to first degree atrioventricular block, there was fusion of early and atrial contributions to ventricular filling.  bilateral duplex lower extremity negative for DVT   2. Hypertension  Continuing Coreg, hydralazine, nitrate  Holding amlodipine since it would cause more pedal edema   3. Diabetes mellitus, type 2  Placing the patient on sliding scale holding oral hypoglycemic  Blood glucose readings have been well controlled.  A1c 6.4%   4. Chronic renal failure - Likely secondary to cardiorenal metabolism.  Monitor BMP, avoid nephrotoxic medications   5. Question of paroxysmal atrial fibrillation - patient has 1st degree AV block and not thought to be having AF  - currently his heart rate is sinus and rate is controlled.   DVT Prophylaxis: subcutaneous Heparin  Nutrition: Diabetic and cardiac diet   Code Status: Full  Disposition: Admitted to inpatient in telemetry  unit  HPI/Subjective: Pt says he is feeling much better   Objective: Filed Vitals:   10/04/13 1002  BP: 122/60  Pulse:   Temp:   Resp:     Intake/Output Summary (Last 24 hours) at 10/04/13 1205 Last data filed at 10/04/13 0843  Gross per 24 hour  Intake   1440 ml  Output   2825 ml  Net  -1385 ml   Filed Weights   10/02/13 0700 10/03/13 0607 10/04/13 0612  Weight: 228 lb 9.6 oz (103.692 kg) 225 lb (102.059 kg) 215 lb 13.3 oz (97.9 kg)    Exam:  General: Awake, no apparent distress, cooperative  Cardiovascular: normal s1, s2 sounds  Respiratory: BBS clear, no wheezes or rales heard  Abdomen: large ventral hernia seen, soft, nontender, normal BS  Musculoskeletal: 1+ pitting edema bilateral LEs marked improvement   Data Reviewed: Basic Metabolic Panel:  Recent Labs Lab 10/01/13 0217 10/02/13 0515 10/03/13 0505 10/04/13 0455  NA 135* 138 140 136*  K 3.0* 4.7 5.3 3.9  CL 94* 98 100 95*  CO2 25 26 27 25   GLUCOSE 172* 131* 111* 127*  BUN 56* 60* 61* 64*  CREATININE 1.95* 2.12* 2.09* 2.04*  CALCIUM 8.9 8.8 8.9 9.0  MG  --  1.9  --   --    Liver Function Tests: No results found for this basename: AST, ALT, ALKPHOS, BILITOT, PROT, ALBUMIN,  in the last 168 hours No results found for this basename: LIPASE, AMYLASE,  in the last 168 hours No results found for this basename: AMMONIA,  in the last 168 hours CBC:  Recent Labs  Lab 10/01/13 0217  WBC 7.7  NEUTROABS 4.6  HGB 10.1*  HCT 29.7*  MCV 88.7  PLT 228   Cardiac Enzymes:  Recent Labs Lab 10/01/13 0217 10/01/13 0900 10/01/13 1425 10/01/13 2030  TROPONINI <0.30 <0.30 <0.30 <0.30   BNP (last 3 results)  Recent Labs  07/12/13 1356 10/01/13 0217 10/02/13 0515  PROBNP 904.2* 2295.0* 1567.0*   CBG:  Recent Labs Lab 10/03/13 1112 10/03/13 1606 10/03/13 2118 10/04/13 0651 10/04/13 1153  GLUCAP 123* 179* 133* 117* 119*    No results found for this or any previous visit (from the past 240  hour(s)).   Studies: No results found.  Scheduled Meds: . allopurinol  100 mg Oral Daily  . aspirin EC  81 mg Oral Daily  . carvedilol  12.5 mg Oral BID WC  . cycloSPORINE  1 drop Both Eyes BID  . docusate sodium  100 mg Oral Q12H  . gabapentin  100 mg Oral BID  . heparin  5,000 Units Subcutaneous Q8H  . hydrALAZINE  25 mg Oral TID  . insulin aspart  0-15 Units Subcutaneous TID WC  . insulin aspart  0-5 Units Subcutaneous QHS  . isosorbide dinitrate  20 mg Oral TID  . metolazone  5 mg Oral Daily  . potassium chloride  20 mEq Oral Daily  . sertraline  200 mg Oral QHS  . simvastatin  10 mg Oral QHS  . sodium chloride  3 mL Intravenous Q12H  . traZODone  50 mg Oral QHS   Continuous Infusions:   Principal Problem:   Acute on chronic congestive heart failure with right ventricular diastolic dysfunction Active Problems:   HTN (hypertension)   Diabetes mellitus   Cardiorenal syndrome with renal failure   Acute on chronic renal failure   First degree atrioventricular block   Arrow Emmerich [employer] Pager [phone]. If 7PM-7AM, please contact night-coverage at [email or web address], password [facility] 10/04/2013, 12:05 PM  LOS: 3 days
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
## 2013-10-04 NOTE — Progress Notes (Signed)
Pt declined flu shot.  Stated" I will get one when i I go to my doctor."  Karie Kirks, RN.

## 2013-10-04 NOTE — Progress Notes (Signed)
The patient's rhythm is still showing a prolonged first degree AV Block.  He is receiving one Percocet for both chronic and acute pain in his legs.  The day shift nurse explained that the patient becomes a little oversedated when he receives two Percocets.  He did not have any acute changes overnight.

## 2013-10-05 DIAGNOSIS — I1310 Hypertensive heart and chronic kidney disease without heart failure, with stage 1 through stage 4 chronic kidney disease, or unspecified chronic kidney disease: Secondary | ICD-10-CM

## 2013-10-05 DIAGNOSIS — N19 Unspecified kidney failure: Secondary | ICD-10-CM

## 2013-10-05 LAB — BASIC METABOLIC PANEL
BUN: 66 mg/dL — AB (ref 6–23)
CHLORIDE: 93 meq/L — AB (ref 96–112)
CO2: 25 meq/L (ref 19–32)
Calcium: 9.1 mg/dL (ref 8.4–10.5)
Creatinine, Ser: 2.27 mg/dL — ABNORMAL HIGH (ref 0.50–1.35)
GFR calc non Af Amer: 26 mL/min — ABNORMAL LOW (ref >90)
GFR, EST AFRICAN AMERICAN: 30 mL/min — AB (ref >90)
Glucose, Bld: 135 mg/dL — ABNORMAL HIGH (ref 70–99)
POTASSIUM: 4.8 meq/L (ref 3.7–5.3)
Sodium: 134 mEq/L — ABNORMAL LOW (ref 137–147)

## 2013-10-05 LAB — GLUCOSE, CAPILLARY
GLUCOSE-CAPILLARY: 151 mg/dL — AB (ref 70–99)
Glucose-Capillary: 116 mg/dL — ABNORMAL HIGH (ref 70–99)

## 2013-10-05 LAB — PRO B NATRIURETIC PEPTIDE: PRO B NATRI PEPTIDE: 1056 pg/mL — AB (ref 0–450)

## 2013-10-05 MED ORDER — TORSEMIDE 20 MG PO TABS
20.0000 mg | ORAL_TABLET | Freq: Every day | ORAL | Status: DC
  Filled 2013-10-05: qty 1
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
## 2013-10-05 NOTE — Progress Notes (Signed)
Pt received all d/c instructions and verbalized understanding.  D/c form floor via w/c at 1655. By NT. To awaiting transportation home.  Karie Kirks, Therapist, sports.

## 2013-10-06 NOTE — Clinical Social Work Psychosocial (Signed)
     Clinical Social Work Department BRIEF PSYCHOSOCIAL ASSESSMENT  Patient:  [name], [name]     Account Number:  [id number]     Admit date:  09/30/2013  Clinical Social Worker:  Iona Coach  Date/Time:  10/04/2013 02:45 PM  Referred by:  Physician  Date Referred:  10/04/2013 Referred for  SNF Placement   Other Referral:   Interview type:  Other - See comment Other interview type:   Patient and granddaughter [name]    PSYCHOSOCIAL DATA Living Status:  WITH DISABLED ADULT Admitted from facility:   Level of care:   Primary support name:  [name], [name]  [phone] Primary support relationship to patient:  CHILD, ADULT Degree of support available:   Strong support    CURRENT CONCERNS Current Concerns  Post-Acute Placement   Other Concerns:    SOCIAL WORK ASSESSMENT / PLAN [name] met with patient today to discuss PT's recommendation for SNF placmeent vs 24 hour care at home. Patient states that he lives with his son- [name]. who is at home with him all the time.  [name] discussed option of SNF placement and patient agreed to bed search stating that he still wants to go home as soon as possible as he "has alot of things to do."  Patient was noted to be alert and oriented to person and place- however, he appeared to have some dfficulty concentrating on the subject being discussed.  He also does not appear to hear well.  With pateint's agreement- bed search will be initiated.  Fl2 placed on chart for MD's signature. Bed search will be initated.   Assessment/plan status:  Psychosocial Support/Ongoing Assessment of Needs Other assessment/ plan:   Information/referral to community resources:   SNF list provided to patient. He would like to go to [facility] if possible    PATIENTS/FAMILYS RESPONSE TO PLAN OF CARE: Patient is alert and appars to be oriented to at least person and place.  He is very pleasant and invovled in his plan of care.  Patient is currently  agreeable to SNF for short term SNF and agreed to bed search. At times- he presented with "rambling thoughts" and seemed to have some difficulty concentrating on subject at hand.  Patient does not appear to hear well either.  [name] provided support and agreed for [name] to contact his granddaughter [name] who helps him with personal issues.  [name] spoke to [name] and she stated that she would support her grandfather if he chose to seek short term SNF and asked to be called with d/c dispostion and bed offers.  Lorie Phenix. [place], [place]

## 2013-10-06 NOTE — Progress Notes (Signed)
[name] contacted patient at home via telephone.  He stated that he had considered SNF placement but that he was worried about co-pays at the nursing center and also that he had "too many appointments."  Patient also stated that he felt that he would get to a nursing center and all he would do is lay down and he'd get " weaker and weaker".  Patient related that his son [name], [name]. Is helpful at home and can assist him with all care needs. Patient also states that he is followed by [facility] for PT and nursing. Will discuss with [facility] to see if this can be re-started.   [name] contacted patient's granddaughter- [name] --she stated that she did not receive a message from [facility] re: SNF beds but she fully supported her grandfather's decision to return home and feels that he will manage well.  [name] offered SNF beds to both patient and granddaughter but they defer at this time. No further [name] needs identified; [name] signing off.  Lorie Phenix. [place], [place]

## 2013-10-06 NOTE — Progress Notes (Signed)
Dr. Venetia Constable notified that [name] has been unable to secure a SNF bed arrangement for patient; message left for granddaughter re: bed offers but no call back. Per MD- patient is medically stable for d/c; plan d/c in the a.m once [name] can facilitate d/c arrangements.  Patient is alert but does have some minor confusion.  [name]. [name], [place]

## 2013-10-06 NOTE — Clinical Social Work Placement (Signed)
     Clinical Social Work Department CLINICAL SOCIAL WORK PLACEMENT NOTE  Patient:  [name], [name]  Account Number:  [id number] Admit date:  09/30/2013  Clinical Social Worker:  Butch Penny Jayquan Bradsher, LCSWA  Date/time:  10/04/2013 03:00 PM  Clinical Social Work is seeking post-discharge placement for this patient at the following level of care:   SKILLED NURSING   (*[name] will update this form in Epic as items are completed)   10/04/2013  Patient/family provided with [place] Department of Clinical Social Works list of facilities offering this level of care within the geographic area requested by the patient (or if unable, by the patients family).  10/04/2013  Patient/family informed of their freedom to choose among providers that offer the needed level of care, that participate in Medicare, Medicaid or managed care program needed by the patient, have an available bed and are willing to accept the patient.  10/04/2013  Patient/family informed of [facility] ownership interest in [facility], as well as of the fact that they are under no obligation to receive care at this facility.  PASARR submitted to EDS on 10/04/2013 PASARR number received from EDS on 10/04/2013  FL2 transmitted to all facilities in geographic area requested by pt/family on  10/05/2013 FL2 transmitted to all facilities within larger geographic area on   Patient informed that his/her managed care company has contracts with or will negotiate with  certain facilities, including the following:   NA     Patient/family informed of bed offers received:   Patient chooses bed at  Physician recommends and patient chooses bed at    Patient to be transferred to  on   Patient to be transferred to facility by   The following physician request were entered in Epic:   Additional Comments: 10/05/13  Bed offers in place- patient is aware but wants his granddaughter to decide. Message left for Deidre- Granddaughter to  call back re: bed offers.  Patient now verbalizes worried about short term SNF- support and encouragement provided; he stated that he would go to SNF if needed.    Lorie Phenix. Genella Rife  10/06/12  [name] was unable to reach patient's granddaughter yesterday re: bed offers and notifeid MD of delay of placement until today. [name] noted that patient was d/c'd home last night.  [name] will follow up patient, granddaughter and RNCM.  Lorie Phenix. [place], [place]

## 2013-10-12 ENCOUNTER — Ambulatory Visit (HOSPITAL_COMMUNITY)
Admit: 2013-10-12 | Discharge: 2013-10-12 | Disposition: A | Discharge status: Home / Self Care | Payer: Medicare Other | Attending: Internal Medicine | Admitting: Internal Medicine

## 2013-10-12 ENCOUNTER — Encounter (HOSPITAL_COMMUNITY): Payer: Self-pay

## 2013-10-12 VITALS — BP 138/80 | HR 75 | Resp 18 | Wt 231.5 lb

## 2013-10-12 DIAGNOSIS — N184 Chronic kidney disease, stage 4 (severe): Secondary | ICD-10-CM | POA: Insufficient documentation

## 2013-10-12 DIAGNOSIS — E119 Type 2 diabetes mellitus without complications: Secondary | ICD-10-CM | POA: Insufficient documentation

## 2013-10-12 DIAGNOSIS — I129 Hypertensive chronic kidney disease with stage 1 through stage 4 chronic kidney disease, or unspecified chronic kidney disease: Secondary | ICD-10-CM | POA: Insufficient documentation

## 2013-10-12 DIAGNOSIS — Z7982 Long term (current) use of aspirin: Secondary | ICD-10-CM | POA: Insufficient documentation

## 2013-10-12 DIAGNOSIS — N183 Chronic kidney disease, stage 3 unspecified: Secondary | ICD-10-CM

## 2013-10-12 DIAGNOSIS — I5032 Chronic diastolic (congestive) heart failure: Secondary | ICD-10-CM | POA: Insufficient documentation

## 2013-10-12 DIAGNOSIS — I5082 Biventricular heart failure: Secondary | ICD-10-CM

## 2013-10-12 DIAGNOSIS — Z79899 Other long term (current) drug therapy: Secondary | ICD-10-CM | POA: Insufficient documentation

## 2013-10-12 DIAGNOSIS — E7800 Pure hypercholesterolemia, unspecified: Secondary | ICD-10-CM | POA: Insufficient documentation

## 2013-10-12 DIAGNOSIS — I10 Essential (primary) hypertension: Secondary | ICD-10-CM

## 2013-10-12 DIAGNOSIS — Z87442 Personal history of urinary calculi: Secondary | ICD-10-CM | POA: Insufficient documentation

## 2013-10-12 DIAGNOSIS — I5033 Acute on chronic diastolic (congestive) heart failure: Secondary | ICD-10-CM

## 2013-10-12 DIAGNOSIS — M109 Gout, unspecified: Secondary | ICD-10-CM | POA: Insufficient documentation

## 2013-10-12 DIAGNOSIS — I509 Heart failure, unspecified: Secondary | ICD-10-CM | POA: Insufficient documentation

## 2013-10-12 LAB — BASIC METABOLIC PANEL
BUN: 64 mg/dL — AB (ref 6–23)
CO2: 25 mEq/L (ref 19–32)
CREATININE: 2.01 mg/dL — AB (ref 0.50–1.35)
Calcium: 8.8 mg/dL (ref 8.4–10.5)
Chloride: 97 mEq/L (ref 96–112)
GFR calc Af Amer: 35 mL/min — ABNORMAL LOW (ref >90)
GFR, EST NON AFRICAN AMERICAN: 30 mL/min — AB (ref >90)
GLUCOSE: 183 mg/dL — AB (ref 70–99)
POTASSIUM: 3.9 meq/L (ref 3.7–5.3)
Sodium: 137 mEq/L (ref 137–147)
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
## 2013-10-12 NOTE — Patient Instructions (Signed)
Continue current medication regimen.  Follow up 6 weeks.  Call clinic with weight gain, swelling, shortness of breath, and any other questions/concerns!

## 2013-10-19 ENCOUNTER — Other Ambulatory Visit (HOSPITAL_COMMUNITY): Payer: Self-pay | Admitting: Cardiology

## 2013-10-19 DIAGNOSIS — I5032 Chronic diastolic (congestive) heart failure: Secondary | ICD-10-CM

## 2013-10-19 MED ORDER — ASPIRIN EC 81 MG PO TBEC: 81.0000 mg | DELAYED_RELEASE_TABLET | Freq: Every day | ORAL | Status: AC

## 2013-10-19 MED ORDER — AMLODIPINE BESYLATE 5 MG PO TABS: 5.0000 mg | ORAL_TABLET | Freq: Every day | ORAL | Status: DC

## 2013-10-19 MED ORDER — CARVEDILOL 12.5 MG PO TABS: 12.5000 mg | ORAL_TABLET | Freq: Two times a day (BID) | ORAL | Status: DC

## 2013-10-23 ENCOUNTER — Encounter (HOSPITAL_COMMUNITY): Payer: Self-pay | Admitting: Emergency Medicine

## 2013-10-23 ENCOUNTER — Emergency Department (HOSPITAL_COMMUNITY): Payer: Medicare Other

## 2013-10-23 ENCOUNTER — Observation Stay (HOSPITAL_COMMUNITY)
Admission: EM | Admit: 2013-10-23 | Discharge: 2013-10-26 | Disposition: A | Discharge status: Home / Self Care | Payer: Medicare Other | Attending: Internal Medicine | Admitting: Internal Medicine

## 2013-10-23 DIAGNOSIS — E876 Hypokalemia: Secondary | ICD-10-CM

## 2013-10-23 DIAGNOSIS — I1310 Hypertensive heart and chronic kidney disease without heart failure, with stage 1 through stage 4 chronic kidney disease, or unspecified chronic kidney disease: Secondary | ICD-10-CM

## 2013-10-23 DIAGNOSIS — I440 Atrioventricular block, first degree: Secondary | ICD-10-CM

## 2013-10-23 DIAGNOSIS — I10 Essential (primary) hypertension: Secondary | ICD-10-CM | POA: Diagnosis present

## 2013-10-23 DIAGNOSIS — I509 Heart failure, unspecified: Secondary | ICD-10-CM

## 2013-10-23 DIAGNOSIS — M1990 Unspecified osteoarthritis, unspecified site: Secondary | ICD-10-CM | POA: Diagnosis present

## 2013-10-23 DIAGNOSIS — N183 Chronic kidney disease, stage 3 unspecified: Secondary | ICD-10-CM | POA: Diagnosis present

## 2013-10-23 DIAGNOSIS — G459 Transient cerebral ischemic attack, unspecified: Secondary | ICD-10-CM | POA: Diagnosis present

## 2013-10-23 DIAGNOSIS — M549 Dorsalgia, unspecified: Secondary | ICD-10-CM

## 2013-10-23 DIAGNOSIS — I499 Cardiac arrhythmia, unspecified: Secondary | ICD-10-CM | POA: Insufficient documentation

## 2013-10-23 DIAGNOSIS — G8929 Other chronic pain: Secondary | ICD-10-CM | POA: Diagnosis present

## 2013-10-23 DIAGNOSIS — I5033 Acute on chronic diastolic (congestive) heart failure: Secondary | ICD-10-CM

## 2013-10-23 DIAGNOSIS — D649 Anemia, unspecified: Secondary | ICD-10-CM

## 2013-10-23 DIAGNOSIS — R1310 Dysphagia, unspecified: Secondary | ICD-10-CM

## 2013-10-23 DIAGNOSIS — R202 Paresthesia of skin: Secondary | ICD-10-CM | POA: Diagnosis present

## 2013-10-23 DIAGNOSIS — E119 Type 2 diabetes mellitus without complications: Secondary | ICD-10-CM | POA: Diagnosis present

## 2013-10-23 DIAGNOSIS — R55 Syncope and collapse: Secondary | ICD-10-CM

## 2013-10-23 DIAGNOSIS — M109 Gout, unspecified: Secondary | ICD-10-CM

## 2013-10-23 DIAGNOSIS — R209 Unspecified disturbances of skin sensation: Principal | ICD-10-CM | POA: Insufficient documentation

## 2013-10-23 DIAGNOSIS — I5082 Biventricular heart failure: Secondary | ICD-10-CM

## 2013-10-23 DIAGNOSIS — N179 Acute kidney failure, unspecified: Secondary | ICD-10-CM

## 2013-10-23 DIAGNOSIS — I129 Hypertensive chronic kidney disease with stage 1 through stage 4 chronic kidney disease, or unspecified chronic kidney disease: Secondary | ICD-10-CM | POA: Insufficient documentation

## 2013-10-23 DIAGNOSIS — N200 Calculus of kidney: Secondary | ICD-10-CM

## 2013-10-23 DIAGNOSIS — I5032 Chronic diastolic (congestive) heart failure: Secondary | ICD-10-CM

## 2013-10-23 DIAGNOSIS — N189 Chronic kidney disease, unspecified: Secondary | ICD-10-CM | POA: Insufficient documentation

## 2013-10-23 DIAGNOSIS — I50810 Right heart failure, unspecified: Secondary | ICD-10-CM

## 2013-10-23 LAB — URINALYSIS, ROUTINE W REFLEX MICROSCOPIC
Bilirubin Urine: NEGATIVE
Glucose, UA: NEGATIVE mg/dL
Hgb urine dipstick: NEGATIVE
KETONES UR: NEGATIVE mg/dL
LEUKOCYTES UA: NEGATIVE
Nitrite: NEGATIVE
PROTEIN: 100 mg/dL — AB
Specific Gravity, Urine: 1.015 (ref 1.005–1.030)
UROBILINOGEN UA: 0.2 mg/dL (ref 0.0–1.0)
pH: 5.5 (ref 5.0–8.0)

## 2013-10-23 LAB — CBC WITH DIFFERENTIAL/PLATELET
BASOS PCT: 0 % (ref 0–1)
Basophils Absolute: 0 10*3/uL (ref 0.0–0.1)
Eosinophils Absolute: 0.4 10*3/uL (ref 0.0–0.7)
Eosinophils Relative: 7 % — ABNORMAL HIGH (ref 0–5)
HCT: 30.1 % — ABNORMAL LOW (ref 39.0–52.0)
HEMOGLOBIN: 10 g/dL — AB (ref 13.0–17.0)
Lymphocytes Relative: 37 % (ref 12–46)
Lymphs Abs: 2 10*3/uL (ref 0.7–4.0)
MCH: 29.9 pg (ref 26.0–34.0)
MCHC: 33.2 g/dL (ref 30.0–36.0)
MCV: 90.1 fL (ref 78.0–100.0)
MONO ABS: 0.5 10*3/uL (ref 0.1–1.0)
MONOS PCT: 10 % (ref 3–12)
Neutro Abs: 2.4 10*3/uL (ref 1.7–7.7)
Neutrophils Relative %: 45 % (ref 43–77)
Platelets: 229 10*3/uL (ref 150–400)
RBC: 3.34 MIL/uL — ABNORMAL LOW (ref 4.22–5.81)
RDW: 14.4 % (ref 11.5–15.5)
WBC: 5.3 10*3/uL (ref 4.0–10.5)

## 2013-10-23 LAB — COMPREHENSIVE METABOLIC PANEL
ALK PHOS: 154 U/L — AB (ref 39–117)
ALT: 22 U/L (ref 0–53)
AST: 33 U/L (ref 0–37)
Albumin: 3.2 g/dL — ABNORMAL LOW (ref 3.5–5.2)
BUN: 41 mg/dL — ABNORMAL HIGH (ref 6–23)
CO2: 22 meq/L (ref 19–32)
Calcium: 9 mg/dL (ref 8.4–10.5)
Chloride: 102 mEq/L (ref 96–112)
Creatinine, Ser: 1.75 mg/dL — ABNORMAL HIGH (ref 0.50–1.35)
GFR, EST AFRICAN AMERICAN: 41 mL/min — AB (ref >90)
GFR, EST NON AFRICAN AMERICAN: 35 mL/min — AB (ref >90)
Glucose, Bld: 71 mg/dL (ref 70–99)
Potassium: 4 mEq/L (ref 3.7–5.3)
SODIUM: 137 meq/L (ref 137–147)
Total Bilirubin: 0.2 mg/dL — ABNORMAL LOW (ref 0.3–1.2)
Total Protein: 7.5 g/dL (ref 6.0–8.3)

## 2013-10-23 LAB — PRO B NATRIURETIC PEPTIDE: Pro B Natriuretic peptide (BNP): 1346 pg/mL — ABNORMAL HIGH (ref 0–450)

## 2013-10-23 LAB — PROTIME-INR
INR: 0.98 (ref 0.00–1.49)
Prothrombin Time: 12.8 seconds (ref 11.6–15.2)

## 2013-10-23 LAB — TROPONIN I

## 2013-10-23 LAB — URINE MICROSCOPIC-ADD ON

## 2013-10-23 LAB — GLUCOSE, CAPILLARY: Glucose-Capillary: 90 mg/dL (ref 70–99)

## 2013-10-23 LAB — LIPASE, BLOOD: Lipase: 117 U/L — ABNORMAL HIGH (ref 11–59)

## 2013-10-23 MED ORDER — DOCUSATE SODIUM 100 MG PO CAPS
100.0000 mg | ORAL_CAPSULE | Freq: Two times a day (BID) | ORAL | Status: DC
  Administered 2013-10-24 – 2013-10-26 (×6): 100 mg via ORAL
  Filled 2013-10-23 (×3): qty 1

## 2013-10-23 MED ORDER — HYPROMELLOSE (GONIOSCOPIC) 2.5 % OP SOLN: 1.0000 [drp] | Freq: Three times a day (TID) | OPHTHALMIC | Status: DC | PRN

## 2013-10-23 MED ORDER — SIMVASTATIN 10 MG PO TABS
10.0000 mg | ORAL_TABLET | Freq: Every day | ORAL | Status: DC
  Administered 2013-10-24 – 2013-10-25 (×3): 10 mg via ORAL
  Filled 2013-10-23 (×4): qty 1

## 2013-10-23 MED ORDER — CYCLOSPORINE 0.05 % OP EMUL
1.0000 [drp] | Freq: Two times a day (BID) | OPHTHALMIC | Status: DC
  Administered 2013-10-24 – 2013-10-26 (×6): 1 [drp] via OPHTHALMIC
  Filled 2013-10-23 (×7): qty 1

## 2013-10-23 MED ORDER — ASPIRIN 325 MG PO TABS
325.0000 mg | ORAL_TABLET | Freq: Every day | ORAL | Status: DC
  Administered 2013-10-24 – 2013-10-26 (×3): 325 mg via ORAL
  Filled 2013-10-23 (×3): qty 1

## 2013-10-23 MED ORDER — OXYCODONE-ACETAMINOPHEN 5-325 MG PO TABS
1.0000 | ORAL_TABLET | ORAL | Status: DC | PRN
  Administered 2013-10-24 – 2013-10-25 (×6): 2 via ORAL
  Administered 2013-10-26: 1 via ORAL
  Administered 2013-10-26 (×2): 2 via ORAL
  Filled 2013-10-23 (×3): qty 2
  Filled 2013-10-23: qty 1
  Filled 2013-10-23 (×4): qty 2

## 2013-10-23 MED ORDER — TORSEMIDE 20 MG PO TABS
20.0000 mg | ORAL_TABLET | Freq: Every day | ORAL | Status: DC
  Administered 2013-10-24 – 2013-10-26 (×3): 20 mg via ORAL
  Filled 2013-10-23 (×3): qty 1

## 2013-10-23 MED ORDER — MORPHINE SULFATE 4 MG/ML IJ SOLN: 4.0000 mg | Freq: Once | INTRAMUSCULAR | Status: DC

## 2013-10-23 MED ORDER — HEPARIN SODIUM (PORCINE) 5000 UNIT/ML IJ SOLN
5000.0000 [IU] | Freq: Three times a day (TID) | INTRAMUSCULAR | Status: DC
  Administered 2013-10-24 – 2013-10-26 (×6): 5000 [IU] via SUBCUTANEOUS
  Filled 2013-10-23 (×9): qty 1

## 2013-10-23 MED ORDER — ALLOPURINOL 100 MG PO TABS
100.0000 mg | ORAL_TABLET | Freq: Every day | ORAL | Status: DC
  Administered 2013-10-24 – 2013-10-26 (×3): 100 mg via ORAL
  Filled 2013-10-23 (×3): qty 1

## 2013-10-23 MED ORDER — CARVEDILOL 12.5 MG PO TABS
12.5000 mg | ORAL_TABLET | Freq: Two times a day (BID) | ORAL | Status: DC
  Administered 2013-10-24 – 2013-10-26 (×5): 12.5 mg via ORAL
  Filled 2013-10-23 (×7): qty 1

## 2013-10-23 MED ORDER — HYDROMORPHONE HCL PF 1 MG/ML IJ SOLN
0.5000 mg | Freq: Once | INTRAMUSCULAR | Status: AC
  Administered 2013-10-23: 0.5 mg via INTRAVENOUS
  Filled 2013-10-23: qty 1

## 2013-10-23 MED ORDER — INSULIN ASPART 100 UNIT/ML ~~LOC~~ SOLN
0.0000 [IU] | Freq: Three times a day (TID) | SUBCUTANEOUS | Status: DC
  Administered 2013-10-24: 2 [IU] via SUBCUTANEOUS
  Administered 2013-10-24 – 2013-10-25 (×2): 3 [IU] via SUBCUTANEOUS

## 2013-10-23 MED ORDER — SERTRALINE HCL 100 MG PO TABS
200.0000 mg | ORAL_TABLET | Freq: Every day | ORAL | Status: DC
  Administered 2013-10-24 – 2013-10-25 (×3): 200 mg via ORAL
  Filled 2013-10-23 (×4): qty 2

## 2013-10-23 MED ORDER — TRAZODONE HCL 50 MG PO TABS
50.0000 mg | ORAL_TABLET | Freq: Every day | ORAL | Status: DC
  Administered 2013-10-24 – 2013-10-25 (×3): 50 mg via ORAL
  Filled 2013-10-23 (×4): qty 1

## 2013-10-23 MED ORDER — ALBUTEROL SULFATE (2.5 MG/3ML) 0.083% IN NEBU: 2.5000 mg | INHALATION_SOLUTION | Freq: Four times a day (QID) | RESPIRATORY_TRACT | Status: DC | PRN

## 2013-10-23 MED ORDER — ISOSORBIDE DINITRATE 20 MG PO TABS
20.0000 mg | ORAL_TABLET | Freq: Three times a day (TID) | ORAL | Status: DC
  Administered 2013-10-24 – 2013-10-26 (×7): 20 mg via ORAL
  Filled 2013-10-23 (×9): qty 1

## 2013-10-23 MED ORDER — HYDRALAZINE HCL 25 MG PO TABS
25.0000 mg | ORAL_TABLET | Freq: Three times a day (TID) | ORAL | Status: DC
  Administered 2013-10-24 – 2013-10-26 (×5): 25 mg via ORAL
  Filled 2013-10-23 (×11): qty 1

## 2013-10-23 MED ORDER — MORPHINE SULFATE 4 MG/ML IJ SOLN
4.0000 mg | Freq: Once | INTRAMUSCULAR | Status: AC
  Administered 2013-10-23: 4 mg via INTRAVENOUS
  Filled 2013-10-23: qty 1

## 2013-10-23 MED ORDER — AMLODIPINE BESYLATE 5 MG PO TABS
5.0000 mg | ORAL_TABLET | Freq: Every day | ORAL | Status: DC
  Administered 2013-10-24 – 2013-10-25 (×2): 5 mg via ORAL
  Filled 2013-10-23 (×2): qty 1

## 2013-10-23 MED ORDER — GABAPENTIN 100 MG PO CAPS
100.0000 mg | ORAL_CAPSULE | Freq: Two times a day (BID) | ORAL | Status: DC
  Administered 2013-10-24 – 2013-10-26 (×6): 100 mg via ORAL
  Filled 2013-10-23 (×7): qty 1

## 2013-10-23 MED ORDER — IOHEXOL 300 MG/ML  SOLN
25.0000 mL | INTRAMUSCULAR | Status: AC
  Administered 2013-10-23 (×2): 25 mL via ORAL

## 2013-10-23 MED ORDER — INSULIN ASPART 100 UNIT/ML ~~LOC~~ SOLN: 0.0000 [IU] | Freq: Every day | SUBCUTANEOUS | Status: DC

## 2013-10-23 MED ORDER — POLYETHYLENE GLYCOL 3350 17 G PO PACK
17.0000 g | PACK | Freq: Every day | ORAL | Status: DC | PRN
  Filled 2013-10-23: qty 1

## 2013-10-23 NOTE — ED Notes (Signed)
Patient transported to X-ray 

## 2013-10-23 NOTE — ED Notes (Signed)
Per pt sts numbness to LLE that started about 7 hours ago. Denies any numbness anywhere else. Pt a very poor historian. sts that his speech is normal. No facial droop noted. sts that he is hurting all over due to arthritis.

## 2013-10-23 NOTE — ED Provider Notes (Signed)
CSN: [id number]     Arrival date & time 10/23/13  1501 History   First MD Initiated Contact with Patient 10/23/13 1514     Chief Complaint  Patient presents with  . Numbness     (Consider location/radiation/quality/duration/timing/severity/associated sxs/prior Treatment) HPI Comments: She presents with "feeling like my left leg is asleep" for the past 8 hours. He endorses pins and needle sensation and feeling like his leg will wake up. He denies any weakness in the leg. He denies any bowel or bladder incontinence. His chronic back pain at baseline and is unchanged. Denies any fevers or vomiting. Denies any chest pain or shortness of breath. His speech seems to be slurred but he says that is normal. He denies any headache. He has chronic right foot drop. He denies any difficulty with seeing or swallowing.  The history is provided by the patient.    Past Medical History  Diagnosis Date  . Pneumonia   . Hypertension   . Hypercholesteremia   . Diabetes mellitus   . Gout   . Irregular heart beat   . Kidney stone   . Lymphoma     s/p partial gastrectomy  . CHF (congestive heart failure)    Past Surgical History  Procedure Laterality Date  . Partial gastrectomy  1994    for lymphoma  . Hernia repair    . Joint replacement    . Back surgery    . Esophagogastroduodenoscopy N/A 10/28/2012    Procedure: ESOPHAGOGASTRODUODENOSCOPY (EGD);  Surgeon: Lear Ng, MD;  Location: [facility];  Service: Endoscopy;  Laterality: N/A;   History reviewed. No pertinent family history. History  Substance Use Topics  . Smoking status: Never Smoker   . Smokeless tobacco: Never Used  . Alcohol Use: No    Review of Systems  Constitutional: Negative for fever, activity change and appetite change.  HENT: Negative for congestion and rhinorrhea.   Eyes: Negative for visual disturbance.  Respiratory: Negative for cough, chest tightness and shortness of breath.   Cardiovascular: Negative for  chest pain.  Gastrointestinal: Negative for nausea, vomiting and abdominal pain.  Genitourinary: Negative for dysuria and hematuria.  Musculoskeletal: Positive for arthralgias and myalgias.  Skin: Negative for rash.  Neurological: Positive for weakness and numbness. Negative for dizziness and headaches.  A complete 10 system review of systems was obtained and all systems are negative except as noted in the HPI and PMH.      Allergies  Review of patient's allergies indicates no known allergies.  Home Medications   No current outpatient prescriptions on file. BP 184/94  Pulse 56  Temp(Src) 97.5 F (36.4 C) (Oral)  Resp 18  Ht 6\' 2"  (1.88 m)  Wt 227 lb 15.3 oz (103.4 kg)  BMI 29.26 kg/m2  SpO2 96% Physical Exam  Constitutional: He is oriented to person, place, and time. He appears well-developed and well-nourished. No distress.  HENT:  Head: Normocephalic and atraumatic.  Mouth/Throat: Oropharynx is clear and moist. No oropharyngeal exudate.  Eyes: Conjunctivae and EOM are normal. Pupils are equal, round, and reactive to light.  Neck: Normal range of motion. Neck supple.  Cardiovascular: Normal rate, regular rhythm and normal heart sounds.   Pulmonary/Chest: Effort normal and breath sounds normal. No respiratory distress.  Abdominal: Soft. There is tenderness.  Large ventral hernia, reducible, abdomen diffusely tenderness  Musculoskeletal: Normal range of motion. He exhibits no edema and no tenderness.  Neurological: He is alert and oriented to person, place, and time. No cranial  nerve deficit. He exhibits normal muscle tone. Coordination normal.  CN 2-12 intact, no ataxia on finger to nose, no nystagmus, no pronator drift, Romberg negative, normal gait. 5/5 strength in left lower extremity. Decreased sensation subjectively. Right lower extremity has chronic foot drop unchanged. 5/5 upper extremity strenght, equal grip strengths  Skin: Skin is warm.    ED Course   Procedures (including critical care time) Labs Review Labs Reviewed  CBC WITH DIFFERENTIAL - Abnormal; Notable for the following:    RBC 3.34 (*)    Hemoglobin 10.0 (*)    HCT 30.1 (*)    Eosinophils Relative 7 (*)    All other components within normal limits  COMPREHENSIVE METABOLIC PANEL - Abnormal; Notable for the following:    BUN 41 (*)    Creatinine, Ser 1.75 (*)    Albumin 3.2 (*)    Alkaline Phosphatase 154 (*)    Total Bilirubin 0.2 (*)    GFR calc non Af Amer 35 (*)    GFR calc Af Amer 41 (*)    All other components within normal limits  LIPASE, BLOOD - Abnormal; Notable for the following:    Lipase 117 (*)    All other components within normal limits  URINALYSIS, ROUTINE W REFLEX MICROSCOPIC - Abnormal; Notable for the following:    Protein, ur 100 (*)    All other components within normal limits  PRO B NATRIURETIC PEPTIDE - Abnormal; Notable for the following:    Pro B Natriuretic peptide (BNP) 1346.0 (*)    All other components within normal limits  URINE MICROSCOPIC-ADD ON - Abnormal; Notable for the following:    Squamous Epithelial / LPF FEW (*)    All other components within normal limits  TROPONIN I  PROTIME-INR  GLUCOSE, CAPILLARY  HEMOGLOBIN A1C  URINE RAPID DRUG SCREEN (HOSP PERFORMED)  LIPID PANEL   Imaging Review Ct Abdomen Pelvis Wo Contrast  10/23/2013   CLINICAL DATA:  Abdominal pain and distention. Elevated serum lipase. PA edema in the left lower extremity.  EXAM: CT ABDOMEN AND PELVIS WITHOUT CONTRAST  TECHNIQUE: Multidetector CT imaging of the abdomen and pelvis was performed following the standard protocol without intravenous contrast.  COMPARISON:  Unenhanced CT abdomen and pelvis 08/25/2012, 10/27/2011, 10/25/2010.  FINDINGS: Beam hardening streak artifact as the patient was unable raise the arms. Prior anterior abdominal wall hernia repair without evidence of recurrence. Prior partial gastrectomy and gastrojejunostomy. Remaining stomach  normal in appearance. Normal appearing small bowel. Diverticulosis involving the entire colon without evidence of acute diverticulitis. Wall thickening involving the sigmoid colon is likely a combination of underdistention and chronic diverticular disease resulting in muscular hypertrophy. Normal appendix in the right mid abdomen and right upper pelvis. No ascites.  Calcified granuloma in the anterior segment right lobe of liver. Relative enlargement of the left lobe and caudate lobe. Within the limits of the unenhanced technique, no significant focal hepatic parenchymal abnormality. Normal unenhanced appearance of the spleen and left adrenal gland. Moderate pancreatic atrophy. Dystrophic calcification involving the right adrenal gland. Gallstones within the otherwise normal-appearing gallbladder. No biliary ductal dilation. Numerous bilateral renal cysts, some of which are likely proteinaceous, as noted previously. No hydronephrosis. No significant lymphadenopathy. Moderate aortoiliofemoral atherosclerosis without aneurysm.  Urinary bladder unremarkable. Mild median lobe prostate gland enlargement. Numerous pelvic phleboliths.  Bone window images demonstrate severe degenerative changes involving the lower thoracic and lumbar spine, ankylosis of the sacroiliac joints, and degenerative changes in the hips. Ground-glass airspace opacities in the visualized  right middle lobe. Visualized lung bases otherwise clear. Heart moderately enlarged with 3 vessel coronary atherosclerosis.  IMPRESSION: 1. No acute abnormalities involving the abdomen or pelvis. 2. Extensive diffuse colonic diverticulosis without evidence of acute diverticulitis. 3. Query hepatic cirrhosis as there is relative enlargement of the left lobe and caudate lobe compared to the right lobe. 4. Cholelithiasis without CT evidence of acute cholecystitis. 5. Stable moderate pancreatic atrophy. 6. Dystrophic calcification involving the right adrenal gland. 7.  Multiple bilateral renal cysts. 8. Mild median lobe prostate gland enlargement. 9. Pure ground-glass airspace opacity in the visualized right middle lobe. This is nonspecific and can be related to infection, inflammation, or early adenocarcinoma. Initial follow-up by chest CT without contrast is recommended in 3 months to confirm persistence. This recommendation follows the consensus statement: Recommendations for the Management of Subsolid Pulmonary Nodules Detected at CT: A Statement from the [employer] as published in Radiology 2013; 266:304-317.   Electronically Signed   By: Evangeline Dakin M.D.   On: 10/23/2013 21:55   Dg Chest 2 View  10/23/2013   CLINICAL DATA:  Weakness, pain, increased shortness of breath at night, history hypertension, diabetes, CHF, past history lymphoma  EXAM: CHEST  2 VIEW  COMPARISON:  10/01/2013  FINDINGS: Enlargement of cardiac silhouette.  Mediastinal contours and pulmonary vascularity normal.  Lungs clear.  No pleural effusion or pneumothorax.  Right shoulder prosthesis.  IMPRESSION: Enlargement of cardiac silhouette.  No acute abnormalities.   Electronically Signed   By: Lavonia Dana M.D.   On: 10/23/2013 16:45   Ct Head Wo Contrast  10/23/2013   CLINICAL DATA:  Numbness in the left lower extremity.  EXAM: CT HEAD WITHOUT CONTRAST  TECHNIQUE: Contiguous axial images were obtained from the base of the skull through the vertex without intravenous contrast.  COMPARISON:  MRI dated 04/24/2013 and CT dated 04/23/2013  FINDINGS: No mass lesion. No midline shift. No acute hemorrhage or hematoma. No extra-axial fluid collections. No evidence of acute infarction. Ventricles are normal. Minimal atrophy. No osseous abnormality.  IMPRESSION: Essentially normal exam for age.   Electronically Signed   By: Rozetta Nunnery M.D.   On: 10/23/2013 16:32   Dg Abd 2 Views  10/23/2013   CLINICAL DATA:  Andominal pain and distention, history hypertension, diabetes, lymphoma post partial  gastrectomy, CHF, kidney stones  EXAM: ABDOMEN - 2 VIEW  COMPARISON:  03/05/2012  FINDINGS: Supine view limited by body habitus.  No gross evidence of bowel distention or bowel wall thickening.  Bones demineralized.  No evidence of bowel obstruction.  No ureter calcifications or free intraperitoneal air seen.  IMPRESSION: Nonspecific bowel gas pattern.   Electronically Signed   By: Lavonia Dana M.D.   On: 10/23/2013 18:39         MDM   Final diagnoses:  Paresthesia   LLE numbness from the knee down that onset at 8:00 this morning.  Patient has right foot drop which is chronic. He denies any numbness or tingling in his arm or right leg. Patient appears to have slurred speech but he says this is normal for him. Denies any chest pain or shortness of breath. He has chronic abdominal pain from a large ventral hernia which is reducible.  On exam patient has intact DP and PT pulses in his lower extremities.  No evidence of limb ischemia. He has several ulcerations to his shins bilaterally.  CT head is negative. Abdominal imaging does not show any acute pathology. His hernia is reducible.  There is no evidence of bowel obstruction.  Given risk factors, will observe and workup for TIA. D/w Dr. Posey Pronto.    Ezequiel Essex, MD 10/24/13 [phone]

## 2013-10-23 NOTE — H&P (Signed)
[facility] History and Physical  Patient: [name]  KZS:[id number]  DOB: 1931/10/23  DOS: the patient was seen and examined on 10/23/2013 PCP: Leola Brazil, MD  Chief Complaint: Left leg numbness  HPI: [name] is a 80 y.o. male with Past medical history of osteoarthritis, diabetes, gout, chronic diastolic dysfunction, chronic kidney disease. The patient is coming from home. The patient presented with complaints of left leg numbness below the knee that has been ongoing since 8:00 in the morning. Patient woke up earlier without any complaint and was at rest when the symptoms started. The numbness did not spread further but remained there until he came to the hospital. His symptoms are gradually improved at present on my evaluation. He denies any headache, dizziness, blurring of vision, chest pain, shortness of breath, nausea, vomiting, abdominal pain, diarrhea, burning urination, active bleeding, incontinence, focal deficit on the upper extremity. He is compliant with his medications.  Review of Systems: as mentioned in the history of present illness.  A Comprehensive review of the other systems is negative.  Past Medical History  Diagnosis Date  . Pneumonia   . Hypertension   . Hypercholesteremia   . Diabetes mellitus   . Gout   . Irregular heart beat   . Kidney stone   . Lymphoma     s/p partial gastrectomy  . CHF (congestive heart failure)    Past Surgical History  Procedure Laterality Date  . Partial gastrectomy  1994    for lymphoma  . Hernia repair    . Joint replacement    . Back surgery    . Esophagogastroduodenoscopy N/A 10/28/2012    Procedure: ESOPHAGOGASTRODUODENOSCOPY (EGD);  Surgeon: Lear Ng, MD;  Location: [facility];  Service: Endoscopy;  Laterality: N/A;   Social History:  reports that he has never smoked. He has never used smokeless tobacco. He reports that he does not drink alcohol or use illicit  drugs. Independent for most of his  ADL.  No Known Allergies  History reviewed. No pertinent family history.  Prior to Admission medications   Medication Sig Start Date End Date Taking? Authorizing Provider  acetaminophen (TYLENOL) 325 MG tablet Take 325 mg by mouth every 6 (six) hours as needed for pain.     Historical Provider, MD  albuterol (PROVENTIL HFA;VENTOLIN HFA) 108 (90 BASE) MCG/ACT inhaler Inhale 2 puffs into the lungs every 6 (six) hours as needed for wheezing or shortness of breath.     Historical Provider, MD  allopurinol (ZYLOPRIM) 100 MG tablet Take 1 tablet (100 mg total) by mouth daily. 08/11/13   Amy D Clegg, NP  amLODipine (NORVASC) 5 MG tablet Take 1 tablet (5 mg total) by mouth daily. 10/19/13   Jolaine Artist, MD  aspirin EC 81 MG tablet Take 1 tablet (81 mg total) by mouth daily. 10/19/13   Jolaine Artist, MD  bisacodyl (DULCOLAX) 5 MG EC tablet Take 5 mg by mouth daily as needed for constipation.     Historical Provider, MD  carvedilol (COREG) 12.5 MG tablet Take 1 tablet (12.5 mg total) by mouth 2 (two) times daily with a meal. 10/19/13   Jolaine Artist, MD  cycloSPORINE (RESTASIS) 0.05 % ophthalmic emulsion Place 1 drop into both eyes 2 (two) times daily.    Historical Provider, MD  docusate sodium (COLACE) 100 MG capsule Take 1 capsule (100 mg total) by mouth every 12 (twelve) hours. 07/29/13   Kristen N Ward, DO  gabapentin (NEURONTIN)  100 MG capsule Take 100 mg by mouth 2 (two) times daily.    Historical Provider, MD  glipiZIDE (GLUCOTROL) 10 MG tablet Take 10 mg by mouth daily.      Historical Provider, MD  hydrALAZINE (APRESOLINE) 25 MG tablet Take 25 mg by mouth 3 (three) times daily.    Historical Provider, MD  HYDROcodone-acetaminophen (NORCO/VICODIN) 5-325 MG per tablet Take 1 tablet by mouth every 6 (six) hours as needed for moderate pain.    Historical Provider, MD  hydroxypropyl methylcellulose (ISOPTO TEARS) 2.5 % ophthalmic solution Place 1  drop into both eyes 3 (three) times daily as needed (dry eyes).     Historical Provider, MD  isosorbide dinitrate (ISORDIL) 20 MG tablet Take 20 mg by mouth 3 (three) times daily.    Historical Provider, MD  niacin 500 MG tablet Take 1,000 mg by mouth at bedtime.     Historical Provider, MD  oxyCODONE-acetaminophen (PERCOCET) 5-325 MG per tablet Take 1-2 tablets by mouth every 4 (four) hours as needed. [id number]   Delora Fuel, MD  polyethylene glycol [facility] / Floria Raveling) packet Take 17 g by mouth daily as needed (constipation). For constipation    Historical Provider, MD  potassium chloride SA (K-DUR,KLOR-CON) 20 MEQ tablet Take 1 tablet (20 mEq total) by mouth daily. 08/11/13   Amy D Ninfa Meeker, NP  sertraline (ZOLOFT) 100 MG tablet Take 200 mg by mouth at bedtime.     Historical Provider, MD  simvastatin (ZOCOR) 20 MG tablet Take 0.5 tablets (10 mg total) by mouth at bedtime. 04/12/13   Jolaine Artist, MD  torsemide (DEMADEX) 20 MG tablet Take 20 mg by mouth daily.    Historical Provider, MD  traZODone (DESYREL) 50 MG tablet Take 50 mg by mouth at bedtime.    Historical Provider, MD    Physical Exam: Filed Vitals:   10/23/13 2100 10/23/13 2209 10/23/13 2215 10/23/13 2321  BP: 177/85 158/64 131/85 184/94  Pulse: 65 64 72 56  Temp:    97.5 F (36.4 C)  TempSrc:    Oral  Resp: 13  21 18   Height:    6\' 2"  (1.88 m)  Weight:    103.4 kg (227 lb 15.3 oz)  SpO2: 100% 100% 97% 96%    General: Alert, Awake and Oriented to Time, Place and Person. Appear in mild distress Eyes: PERRL ENT: Oral Mucosa clear moist. Neck: No JVD Cardiovascular: S1 and S2 Present, aortic systolic Murmur, Peripheral Pulses Present Respiratory: Bilateral Air entry equal and Decreased, Clear to Auscultation,  No Crackles, no wheezes Abdomen: Bowel Sound Present, Soft and Non tender Skin: No Rash Extremities: Bilateral Pedal edema, bilateral open wound on shin, no calf tenderness Neurologic: Grossly Unremarkable. Other  than right foot drop which is chronic and bilateral generalized weakness  Labs on Admission:  CBC:  Recent Labs Lab 10/23/13 1700  WBC 5.3  NEUTROABS 2.4  HGB 10.0*  HCT 30.1*  MCV 90.1  PLT 229    CMP     Component Value Date/Time   NA 137 10/23/2013 1700   K 4.0 10/23/2013 1700   CL 102 10/23/2013 1700   CO2 22 10/23/2013 1700   GLUCOSE 71 10/23/2013 1700   BUN 41* 10/23/2013 1700   CREATININE 1.75* 10/23/2013 1700   CALCIUM 9.0 10/23/2013 1700   PROT 7.5 10/23/2013 1700   ALBUMIN 3.2* 10/23/2013 1700   AST 33 10/23/2013 1700   ALT 22 10/23/2013 1700   ALKPHOS 154* 10/23/2013 1700   BILITOT  0.2* 10/23/2013 1700   GFRNONAA 35* 10/23/2013 1700   GFRAA 41* 10/23/2013 1700     Recent Labs Lab 10/23/13 1700  LIPASE 117*   No results found for this basename: AMMONIA,  in the last 168 hours   Recent Labs Lab 10/23/13 1700  TROPONINI <0.30   BNP (last 3 results)  Recent Labs  10/02/13 0515 10/05/13 0258 10/23/13 1700  PROBNP 1567.0* 1056.0* 1346.0*    Radiological Exams on Admission: Ct Abdomen Pelvis Wo Contrast  10/23/2013   CLINICAL DATA:  Abdominal pain and distention. Elevated serum lipase. PA edema in the left lower extremity.  EXAM: CT ABDOMEN AND PELVIS WITHOUT CONTRAST  TECHNIQUE: Multidetector CT imaging of the abdomen and pelvis was performed following the standard protocol without intravenous contrast.  COMPARISON:  Unenhanced CT abdomen and pelvis 08/25/2012, 10/27/2011, 10/25/2010.  FINDINGS: Beam hardening streak artifact as the patient was unable raise the arms. Prior anterior abdominal wall hernia repair without evidence of recurrence. Prior partial gastrectomy and gastrojejunostomy. Remaining stomach normal in appearance. Normal appearing small bowel. Diverticulosis involving the entire colon without evidence of acute diverticulitis. Wall thickening involving the sigmoid colon is likely a combination of underdistention and chronic diverticular disease  resulting in muscular hypertrophy. Normal appendix in the right mid abdomen and right upper pelvis. No ascites.  Calcified granuloma in the anterior segment right lobe of liver. Relative enlargement of the left lobe and caudate lobe. Within the limits of the unenhanced technique, no significant focal hepatic parenchymal abnormality. Normal unenhanced appearance of the spleen and left adrenal gland. Moderate pancreatic atrophy. Dystrophic calcification involving the right adrenal gland. Gallstones within the otherwise normal-appearing gallbladder. No biliary ductal dilation. Numerous bilateral renal cysts, some of which are likely proteinaceous, as noted previously. No hydronephrosis. No significant lymphadenopathy. Moderate aortoiliofemoral atherosclerosis without aneurysm.  Urinary bladder unremarkable. Mild median lobe prostate gland enlargement. Numerous pelvic phleboliths.  Bone window images demonstrate severe degenerative changes involving the lower thoracic and lumbar spine, ankylosis of the sacroiliac joints, and degenerative changes in the hips. Ground-glass airspace opacities in the visualized right middle lobe. Visualized lung bases otherwise clear. Heart moderately enlarged with 3 vessel coronary atherosclerosis.  IMPRESSION: 1. No acute abnormalities involving the abdomen or pelvis. 2. Extensive diffuse colonic diverticulosis without evidence of acute diverticulitis. 3. Query hepatic cirrhosis as there is relative enlargement of the left lobe and caudate lobe compared to the right lobe. 4. Cholelithiasis without CT evidence of acute cholecystitis. 5. Stable moderate pancreatic atrophy. 6. Dystrophic calcification involving the right adrenal gland. 7. Multiple bilateral renal cysts. 8. Mild median lobe prostate gland enlargement. 9. Pure ground-glass airspace opacity in the visualized right middle lobe. This is nonspecific and can be related to infection, inflammation, or early adenocarcinoma. Initial  follow-up by chest CT without contrast is recommended in 3 months to confirm persistence. This recommendation follows the consensus statement: Recommendations for the Management of Subsolid Pulmonary Nodules Detected at CT: A Statement from the [place] as published in Radiology 2013; 266:304-317.   Electronically Signed   By: Evangeline Dakin M.D.   On: 10/23/2013 21:55   Dg Chest 2 View  10/23/2013   CLINICAL DATA:  Weakness, pain, increased shortness of breath at night, history hypertension, diabetes, CHF, past history lymphoma  EXAM: CHEST  2 VIEW  COMPARISON:  10/01/2013  FINDINGS: Enlargement of cardiac silhouette.  Mediastinal contours and pulmonary vascularity normal.  Lungs clear.  No pleural effusion or pneumothorax.  Right shoulder prosthesis.  IMPRESSION: Enlargement  of cardiac silhouette.  No acute abnormalities.   Electronically Signed   By: Lavonia Dana M.D.   On: 10/23/2013 16:45   Ct Head Wo Contrast  10/23/2013   CLINICAL DATA:  Numbness in the left lower extremity.  EXAM: CT HEAD WITHOUT CONTRAST  TECHNIQUE: Contiguous axial images were obtained from the base of the skull through the vertex without intravenous contrast.  COMPARISON:  MRI dated 04/24/2013 and CT dated 04/23/2013  FINDINGS: No mass lesion. No midline shift. No acute hemorrhage or hematoma. No extra-axial fluid collections. No evidence of acute infarction. Ventricles are normal. Minimal atrophy. No osseous abnormality.  IMPRESSION: Essentially normal exam for age.   Electronically Signed   By: Rozetta Nunnery M.D.   On: 10/23/2013 16:32   Dg Abd 2 Views  10/23/2013   CLINICAL DATA:  Andominal pain and distention, history hypertension, diabetes, lymphoma post partial gastrectomy, CHF, kidney stones  EXAM: ABDOMEN - 2 VIEW  COMPARISON:  03/05/2012  FINDINGS: Supine view limited by body habitus.  No gross evidence of bowel distention or bowel wall thickening.  Bones demineralized.  No evidence of bowel obstruction.  No  ureter calcifications or free intraperitoneal air seen.  IMPRESSION: Nonspecific bowel gas pattern.   Electronically Signed   By: Lavonia Dana M.D.   On: 10/23/2013 18:39    EKG: Independently reviewed. Irregularly irregular with PVCs.  Assessment/Plan Principal Problem:   TIA (transient ischemic attack) Active Problems:   HTN (hypertension)   Diabetes mellitus   Chronic back pain   CKD (chronic kidney disease) stage 3, GFR 30-59 ml/min   Osteoarthritis   Paresthesias   1. TIA (transient ischemic attack) The patient is presenting with complaints of left leg numbness. He does not appear to have a cyanosis or limb ischemia. His symptoms are gradually improving on my evaluation he does not have any discrepancy as compared to the right leg when it comes to paresthesia. His a CT scan is negative EKG does not show any acute abnormality with this the patient will be admitted to hospital for observation, serial neuro checks, telemetry, increasing the dose of aspirin. I would of been an MRI if the MRI shows significant finding and be been consulted neurology for further input. We'll consult PT OT and speech evaluation  2. Irregular heart rhythm Patient has been evaluated by cardiology during last admission who was under the opinion that the patient appears to have sinus bradycardia with very small P wave which could appear like atrial fibrillation. I'm repeating an EKG follow serial troponins.  3. Hypertension Continue Coreg, amlodipine, hydralazine, M.D. her from tomorrow permissive hypertension today  4. Diabetes mellitus continuing him on sliding scale  DVT Prophylaxis: subcutaneous Heparin Nutrition: N.p.o. and advance as tolerated  Code Status: Full  Disposition: Admitted to observation in telemetry unit.  Author: Berle Mull, MD [facility] Pager: [phone] 10/23/2013, 11:38 PM    If 7PM-7AM, please contact night-coverage [email or web address] Password [email or web address]

## 2013-10-23 NOTE — ED Notes (Addendum)
Pt also c/o abdominal pain, abdomen distended, possible hernia. Bowel sounds present all quadrants.

## 2013-10-23 NOTE — ED Notes (Signed)
Pt states numbness/tingling to L foot. 2+ pitting edema noted to LLE. Pt states numbness started this morning at home. Pedal pulses present bilaterally. Able to wiggle digits. LLE painful and cold to touch. Speech clear, able to move all extremities. Pt is alert and oriented x4.

## 2013-10-24 ENCOUNTER — Observation Stay (HOSPITAL_COMMUNITY): Payer: Medicare Other

## 2013-10-24 ENCOUNTER — Encounter (HOSPITAL_COMMUNITY): Payer: Self-pay | Admitting: *Deleted

## 2013-10-24 LAB — CBC WITH DIFFERENTIAL/PLATELET
BASOS ABS: 0 10*3/uL (ref 0.0–0.1)
Basophils Relative: 0 % (ref 0–1)
Eosinophils Absolute: 0.3 10*3/uL (ref 0.0–0.7)
Eosinophils Relative: 7 % — ABNORMAL HIGH (ref 0–5)
HCT: 29.7 % — ABNORMAL LOW (ref 39.0–52.0)
Hemoglobin: 9.7 g/dL — ABNORMAL LOW (ref 13.0–17.0)
LYMPHS ABS: 1.7 10*3/uL (ref 0.7–4.0)
LYMPHS PCT: 36 % (ref 12–46)
MCH: 29.7 pg (ref 26.0–34.0)
MCHC: 32.7 g/dL (ref 30.0–36.0)
MCV: 90.8 fL (ref 78.0–100.0)
Monocytes Absolute: 0.5 10*3/uL (ref 0.1–1.0)
Monocytes Relative: 11 % (ref 3–12)
NEUTROS PCT: 45 % (ref 43–77)
Neutro Abs: 2.2 10*3/uL (ref 1.7–7.7)
PLATELETS: 225 10*3/uL (ref 150–400)
RBC: 3.27 MIL/uL — AB (ref 4.22–5.81)
RDW: 14.4 % (ref 11.5–15.5)
WBC: 4.7 10*3/uL (ref 4.0–10.5)

## 2013-10-24 LAB — COMPREHENSIVE METABOLIC PANEL
ALK PHOS: 158 U/L — AB (ref 39–117)
ALT: 21 U/L (ref 0–53)
AST: 33 U/L (ref 0–37)
Albumin: 3 g/dL — ABNORMAL LOW (ref 3.5–5.2)
BUN: 36 mg/dL — ABNORMAL HIGH (ref 6–23)
CO2: 21 mEq/L (ref 19–32)
Calcium: 8.9 mg/dL (ref 8.4–10.5)
Chloride: 101 mEq/L (ref 96–112)
Creatinine, Ser: 1.5 mg/dL — ABNORMAL HIGH (ref 0.50–1.35)
GFR calc non Af Amer: 43 mL/min — ABNORMAL LOW (ref >90)
GFR, EST AFRICAN AMERICAN: 49 mL/min — AB (ref >90)
GLUCOSE: 157 mg/dL — AB (ref 70–99)
POTASSIUM: 4.2 meq/L (ref 3.7–5.3)
Sodium: 134 mEq/L — ABNORMAL LOW (ref 137–147)
TOTAL PROTEIN: 7.2 g/dL (ref 6.0–8.3)
Total Bilirubin: 0.3 mg/dL (ref 0.3–1.2)

## 2013-10-24 LAB — GLUCOSE, CAPILLARY
GLUCOSE-CAPILLARY: 165 mg/dL — AB (ref 70–99)
Glucose-Capillary: 167 mg/dL — ABNORMAL HIGH (ref 70–99)
Glucose-Capillary: 133 mg/dL — ABNORMAL HIGH (ref 70–99)
Glucose-Capillary: 114 mg/dL — ABNORMAL HIGH (ref 70–99)

## 2013-10-24 LAB — RAPID URINE DRUG SCREEN, HOSP PERFORMED
AMPHETAMINES: NOT DETECTED
Barbiturates: NOT DETECTED
Benzodiazepines: NOT DETECTED
COCAINE: NOT DETECTED
Opiates: POSITIVE — AB
TETRAHYDROCANNABINOL: NOT DETECTED

## 2013-10-24 LAB — PROTIME-INR
INR: 1.11 (ref 0.00–1.49)
Prothrombin Time: 14.1 seconds (ref 11.6–15.2)

## 2013-10-24 LAB — HEMOGLOBIN A1C
Hgb A1c MFr Bld: 6.4 % — ABNORMAL HIGH (ref <5.7)
MEAN PLASMA GLUCOSE: 137 mg/dL — AB (ref <117)

## 2013-10-24 LAB — LIPID PANEL
CHOL/HDL RATIO: 3.3 ratio
Cholesterol: 109 mg/dL (ref 0–200)
HDL: 33 mg/dL — AB (ref >39)
LDL Cholesterol: 45 mg/dL (ref 0–99)
Triglycerides: 153 mg/dL — ABNORMAL HIGH (ref <150)
VLDL: 31 mg/dL (ref 0–40)

## 2013-10-24 NOTE — Evaluation (Signed)
Occupational Therapy Evaluation Patient Details Name: [name] MRN: [id number] DOB: 1932/06/06 Today's Date: 10/24/2013 Time: 1245-8099 OT Time Calculation (min): 26 min  OT Assessment / Plan / Recommendation History of present illness   80 y.o. male with Past medical history of osteoarthritis, diabetes, gout, chronic diastolic dysfunction, chronic kidney disease. Pt admitted with left leg numbness.     Clinical Impression   Pt presents with below problem list. Feel pt will benefit from acute OT to increase independence prior to d/c. Recommending SNF for additional rehab prior to d/c home-pt states no one is with him 24/7.      OT Assessment  Patient needs continued OT Services    Follow Up Recommendations  SNF    Barriers to Discharge Decreased caregiver support    Equipment Recommendations  3 in 1 bedside comode    Recommendations for Other Services    Frequency       Precautions / Restrictions Precautions Precautions: Fall Restrictions Weight Bearing Restrictions: No   Pertinent Vitals/Pain Pain all over, but not rated. Nurse notified.     ADL  Eating/Feeding: Independent Where Assessed - Eating/Feeding: Bed level Grooming: Wash/dry face;Teeth care;Min guard;Wash/dry hands Where Assessed - Grooming: Supported standing Upper Body Bathing: Minimal assistance Where Assessed - Upper Body Bathing: Unsupported sitting Lower Body Bathing: Minimal assistance Where Assessed - Lower Body Bathing: Supported sit to stand Upper Body Dressing: Minimal assistance Where Assessed - Upper Body Dressing: Unsupported sitting Lower Body Dressing: Moderate assistance Where Assessed - Lower Body Dressing: Supported sit to stand Toilet Transfer: Magazine features editor Method: Sit to Loss adjuster, chartered: Comfort height toilet;Grab bars Toileting - Water quality scientist and Hygiene: Minimal assistance Where Assessed - Best boy and Hygiene:  Standing;Sit to stand from 3-in-1 or toilet Tub/Shower Transfer Method: Not assessed Equipment Used: Gait belt;Cane;Rolling walker Transfers/Ambulation Related to ADLs: Min A/Min guard ADL Comments: Told pt that button up shirts may be easier due to limited ROM in UE's and also educated on dressing technique.     OT Diagnosis: Generalized weakness;Acute pain  OT Problem List: Decreased strength;Impaired balance (sitting and/or standing);Decreased activity tolerance;Decreased range of motion;Decreased knowledge of use of DME or AE;Decreased knowledge of precautions;Decreased safety awareness;Pain;Impaired UE functional use OT Treatment Interventions: Self-care/ADL training;DME and/or AE instruction;Therapeutic activities;Patient/family education;Balance training   OT Goals(Current goals can be found in the care plan section) Acute Rehab OT Goals OT Goal Formulation: With patient Time For Goal Achievement: 10/31/13 Potential to Achieve Goals: Good ADL Goals Pt Will Perform Grooming: standing;with supervision Pt Will Perform Lower Body Bathing: with supervision;with adaptive equipment;sit to/from stand Pt Will Perform Lower Body Dressing: with supervision;with adaptive equipment;sit to/from stand Pt Will Transfer to Toilet: with supervision;ambulating;regular height toilet;grab bars Pt Will Perform Toileting - Clothing Manipulation and hygiene: with supervision;sit to/from stand  Visit Information  Last OT Received On: 10/24/13 Assistance Needed: +1       Prior [place] expects to be discharged to:: Private residence Living Arrangements: Children;Other relatives Available Help at Discharge: Family;Available PRN/intermittently Type of Home: House Home Access: Ramped entrance Home Layout: One level Home Equipment: [place] - 4 wheels;Cane - single point;Electric scooter;Shower seat - built in;Grab bars - toilet;Adaptive equipment Adaptive Equipment:  Reacher Prior Function Level of Independence: Needs assistance Gait / Transfers Assistance Needed: son does the cooking and shopping Comments: pt drives and reports he was independent with bathing and dressing Communication Communication: Expressive difficulties;HOH  Vision/Perception Vision - History Baseline Vision: Wears glasses only for reading Visual History: Corrective eye surgery (cataracts) Patient Visual Report: No change from baseline   Cognition  Cognition Arousal/Alertness: Awake/alert Behavior During Therapy: WFL for tasks assessed/performed Overall Cognitive Status: No family/caregiver present to determine baseline cognitive functioning    Extremity/Trunk Assessment Upper Extremity Assessment Upper Extremity Assessment: Generalized weakness;RUE deficits/detail;LUE deficits/detail RUE Deficits / Details: previous shoulder surgery and arthritis; limited shoulder flexion LUE Deficits / Details: reports arthritis; limited shoulder flexion Lower Extremity Assessment Lower Extremity Assessment: Defer to PT evaluation     Mobility Bed Mobility Overal bed mobility: Needs Assistance Bed Mobility: Supine to Sit Supine to sit: Supervision General bed mobility comments: moves slowly and used rail Transfers Overall transfer level: Needs assistance Equipment used: Straight cane;Rolling walker (2 wheeled) Transfers: Sit to/from Stand Sit to Stand: Min assist;Min guard General transfer comment: cues for technique.     Exercise     Balance     End of Session OT - End of Session Equipment Utilized During Treatment: Gait belt;Rolling walker (cane) Activity Tolerance: Patient tolerated treatment well Patient left: in bed;with call bell/phone within reach;with bed alarm set Nurse Communication: Mobility status (pain)  GO Functional Assessment Tool Used: clinical judgment Functional Limitation: Self care Self Care Current Status (B1478): At least 20 percent but  less than 40 percent impaired, limited or restricted Self Care Goal Status (G9562): At least 1 percent but less than 20 percent impaired, limited or restricted   Benito Mccreedy OTR/L [phone] 10/24/2013, 9:29 AM

## 2013-10-24 NOTE — Progress Notes (Addendum)
[facility] PROGRESS NOTE  [name] [id number] DOB: [birth year] DOA: 10/23/2013 PCP: Leola Brazil, MD  Assessment/Plan: TIA (transient ischemic attack)  -patient thinks related to knee pain -CT scan is negative  -MRI and if the MRI shows significant finding will consult neurology for further input.  We'll consult PT OT and speech evaluation   Irregular heart rhythm  Patient has been evaluated by cardiology during last admission who was under the opinion that the patient appears to have sinus bradycardia with very small P wave which could appear like atrial fibrillation.  -serial troponins.    Hypertension  Continue Coreg, amlodipine, hydralazine   Diabetes mellitus  sliding scale   Code Status: full Family Communication: patient/son on phone Disposition Plan: obs   Consultants:  none  Procedures:    Antibiotics:    HPI/Subjective: C/o right knee pain and swelling  Objective: Filed Vitals:   10/24/13 0528  BP: 154/79  Pulse: 69  Temp: 97.9 F (36.6 C)  Resp: 18    Intake/Output Summary (Last 24 hours) at 10/24/13 0915 Last data filed at 10/23/13 2213  Gross per 24 hour  Intake      0 ml  Output    625 ml  Net   -625 ml   Filed Weights   10/23/13 2321  Weight: 103.4 kg (227 lb 15.3 oz)    Exam:  General: Alert, Awake and Oriented to Time, Place and Person. Very religious Cardiovascular: S1 and S2 Present, aortic systolic Murmur, Peripheral Pulses Present  Respiratory: Bilateral Air entry equal and Decreased, Clear to Auscultation,  No Crackles, no wheezes  Abdomen: Bowel Sound Present, Soft and Non tender  Extremities: Bilateral Pedal edema, bilateral open wound on shin, no calf tenderness; right knee pain; patient says slurred speech is his baseline Neurologic: right foot drop which is chronic    Data Reviewed: Basic Metabolic Panel:  Recent Labs Lab 10/23/13 1700 10/24/13 0730  NA 137 134*  K 4.0 4.2  CL  102 101  CO2 22 21  GLUCOSE 71 157*  BUN 41* 36*  CREATININE 1.75* 1.50*  CALCIUM 9.0 8.9   Liver Function Tests:  Recent Labs Lab 10/23/13 1700 10/24/13 0730  AST 33 33  ALT 22 21  ALKPHOS 154* 158*  BILITOT 0.2* 0.3  PROT 7.5 7.2  ALBUMIN 3.2* 3.0*    Recent Labs Lab 10/23/13 1700  LIPASE 117*   No results found for this basename: AMMONIA,  in the last 168 hours CBC:  Recent Labs Lab 10/23/13 1700 10/24/13 0730  WBC 5.3 4.7  NEUTROABS 2.4 2.2  HGB 10.0* 9.7*  HCT 30.1* 29.7*  MCV 90.1 90.8  PLT 229 225   Cardiac Enzymes:  Recent Labs Lab 10/23/13 1700  TROPONINI <0.30   BNP (last 3 results)  Recent Labs  10/02/13 0515 10/05/13 0258 10/23/13 1700  PROBNP 1567.0* 1056.0* 1346.0*   CBG:  Recent Labs Lab 10/23/13 2346 10/24/13 0643  GLUCAP 90 167*    No results found for this or any previous visit (from the past 240 hour(s)).   Studies: Ct Abdomen Pelvis Wo Contrast  10/23/2013   CLINICAL DATA:  Abdominal pain and distention. Elevated serum lipase. PA edema in the left lower extremity.  EXAM: CT ABDOMEN AND PELVIS WITHOUT CONTRAST  TECHNIQUE: Multidetector CT imaging of the abdomen and pelvis was performed following the standard protocol without intravenous contrast.  COMPARISON:  Unenhanced CT abdomen and pelvis 08/25/2012, 10/27/2011, 10/25/2010.  FINDINGS: Beam hardening streak artifact  as the patient was unable raise the arms. Prior anterior abdominal wall hernia repair without evidence of recurrence. Prior partial gastrectomy and gastrojejunostomy. Remaining stomach normal in appearance. Normal appearing small bowel. Diverticulosis involving the entire colon without evidence of acute diverticulitis. Wall thickening involving the sigmoid colon is likely a combination of underdistention and chronic diverticular disease resulting in muscular hypertrophy. Normal appendix in the right mid abdomen and right upper pelvis. No ascites.  Calcified  granuloma in the anterior segment right lobe of liver. Relative enlargement of the left lobe and caudate lobe. Within the limits of the unenhanced technique, no significant focal hepatic parenchymal abnormality. Normal unenhanced appearance of the spleen and left adrenal gland. Moderate pancreatic atrophy. Dystrophic calcification involving the right adrenal gland. Gallstones within the otherwise normal-appearing gallbladder. No biliary ductal dilation. Numerous bilateral renal cysts, some of which are likely proteinaceous, as noted previously. No hydronephrosis. No significant lymphadenopathy. Moderate aortoiliofemoral atherosclerosis without aneurysm.  Urinary bladder unremarkable. Mild median lobe prostate gland enlargement. Numerous pelvic phleboliths.  Bone window images demonstrate severe degenerative changes involving the lower thoracic and lumbar spine, ankylosis of the sacroiliac joints, and degenerative changes in the hips. Ground-glass airspace opacities in the visualized right middle lobe. Visualized lung bases otherwise clear. Heart moderately enlarged with 3 vessel coronary atherosclerosis.  IMPRESSION: 1. No acute abnormalities involving the abdomen or pelvis. 2. Extensive diffuse colonic diverticulosis without evidence of acute diverticulitis. 3. Query hepatic cirrhosis as there is relative enlargement of the left lobe and caudate lobe compared to the right lobe. 4. Cholelithiasis without CT evidence of acute cholecystitis. 5. Stable moderate pancreatic atrophy. 6. Dystrophic calcification involving the right adrenal gland. 7. Multiple bilateral renal cysts. 8. Mild median lobe prostate gland enlargement. 9. Pure ground-glass airspace opacity in the visualized right middle lobe. This is nonspecific and can be related to infection, inflammation, or early adenocarcinoma. Initial follow-up by chest CT without contrast is recommended in 3 months to confirm persistence. This recommendation follows the  consensus statement: Recommendations for the Management of Subsolid Pulmonary Nodules Detected at CT: A Statement from the [place] as published in Radiology 2013; 266:304-317.   Electronically Signed   By: Evangeline Dakin M.D.   On: 10/23/2013 21:55   Dg Chest 2 View  10/23/2013   CLINICAL DATA:  Weakness, pain, increased shortness of breath at night, history hypertension, diabetes, CHF, past history lymphoma  EXAM: CHEST  2 VIEW  COMPARISON:  10/01/2013  FINDINGS: Enlargement of cardiac silhouette.  Mediastinal contours and pulmonary vascularity normal.  Lungs clear.  No pleural effusion or pneumothorax.  Right shoulder prosthesis.  IMPRESSION: Enlargement of cardiac silhouette.  No acute abnormalities.   Electronically Signed   By: Lavonia Dana M.D.   On: 10/23/2013 16:45   Ct Head Wo Contrast  10/23/2013   CLINICAL DATA:  Numbness in the left lower extremity.  EXAM: CT HEAD WITHOUT CONTRAST  TECHNIQUE: Contiguous axial images were obtained from the base of the skull through the vertex without intravenous contrast.  COMPARISON:  MRI dated 04/24/2013 and CT dated 04/23/2013  FINDINGS: No mass lesion. No midline shift. No acute hemorrhage or hematoma. No extra-axial fluid collections. No evidence of acute infarction. Ventricles are normal. Minimal atrophy. No osseous abnormality.  IMPRESSION: Essentially normal exam for age.   Electronically Signed   By: Rozetta Nunnery M.D.   On: 10/23/2013 16:32   Dg Abd 2 Views  10/23/2013   CLINICAL DATA:  Andominal pain and distention, history hypertension, diabetes,  lymphoma post partial gastrectomy, CHF, kidney stones  EXAM: ABDOMEN - 2 VIEW  COMPARISON:  03/05/2012  FINDINGS: Supine view limited by body habitus.  No gross evidence of bowel distention or bowel wall thickening.  Bones demineralized.  No evidence of bowel obstruction.  No ureter calcifications or free intraperitoneal air seen.  IMPRESSION: Nonspecific bowel gas pattern.   Electronically Signed    By: Lavonia Dana M.D.   On: 10/23/2013 18:39    Scheduled Meds: . allopurinol  100 mg Oral Daily  . amLODipine  5 mg Oral Daily  . aspirin  325 mg Oral Daily  . carvedilol  12.5 mg Oral BID WC  . cycloSPORINE  1 drop Both Eyes BID  . docusate sodium  100 mg Oral BID  . gabapentin  100 mg Oral BID  . heparin  5,000 Units Subcutaneous 3 times per day  . hydrALAZINE  25 mg Oral 3 times per day  . insulin aspart  0-15 Units Subcutaneous TID WC  . insulin aspart  0-5 Units Subcutaneous QHS  . isosorbide dinitrate  20 mg Oral TID  . sertraline  200 mg Oral QHS  . simvastatin  10 mg Oral QHS  . torsemide  20 mg Oral Daily  . traZODone  50 mg Oral QHS   Continuous Infusions:   Principal Problem:   TIA (transient ischemic attack) Active Problems:   HTN (hypertension)   Diabetes mellitus   Chronic back pain   CKD (chronic kidney disease) stage 3, GFR 30-59 ml/min   Osteoarthritis   Paresthesias    Time spent: [place], [place] [facility] Pager [phone]. If 7PM-7AM, please contact night-coverage at [email or web address], password [facility] 10/24/2013, 9:15 AM  LOS: 1 day

## 2013-10-24 NOTE — Progress Notes (Signed)
UR completed 

## 2013-10-24 NOTE — Clinical Social Work Psychosocial (Signed)
Clinical Social Work Department BRIEF PSYCHOSOCIAL ASSESSMENT 10/24/2013  Patient:  [name], [name]     Account Number:  [id number]     Admit date:  10/23/2013  Clinical Social Worker:  [name]  Date/Time:  10/24/2013 12:53 PM  Referred by:  Physician  Date Referred:  10/24/2013 Referred for  SNF Placement   Other Referral:   none.   Interview type:  Patient Other interview type:   none.    PSYCHOSOCIAL DATA Living Status:  FAMILY Admitted from facility:   Level of care:   Primary support name:  [name] Primary support relationship to patient:  CHILD, ADULT Degree of support available:   Unsure. Pt stated that he lives with his son and two grandsons. Pt was unable to provide the care that pt's son provides. [name] has not spoken with pt's son.    CURRENT CONCERNS Current Concerns  Post-Acute Placement  Behavioral Health Issues   Other Concerns:   Pt stated that he sees a "psychologist for PTSD." Pt was speaking about religion in a non-realistic manner. Please see assessment below for more detail.    SOCIAL WORK ASSESSMENT / PLAN [name] met with pt at bedside to discuss possible discharge planning of SNF placement. Pt began speaking to [name] regarding religion, making statements that "God has planned my life for me, my plan for life is written inside me and I trust only in God." When [name] began to ask pt about SNF placement, pt would not answer whether he was open to SNF placement or returning home. Pt repeatively stated that "God has already written my plan and I have all my trust in him." Pt informed [name] that pt "does not trust man, and especially don't trust women because women were created to make men sin." [name] was able to speak to pt regarding his home life. Pt stated that he lives in his father's old home with his son and two grandsons. Pt stated that he has "aides" that come out through the week to help him with therapy. Pt spoke about his knee pain. Pt  also stated that he "don't worry about paying my bills, God paid all my debts when he died for my sins. He paid with 30 pieces of silver, so I don't worry about paying for my hospital bill." Pt later when on to inform [name] that he has a [place] appointment tomorrow, and can not miss it or the [place] will not pay for his medications. [name] spent 45 minutes with pt, while pt discussed religion and his beliefs. [name] consulted with [facility] and MD regarding information above. [name] to continue to follow. MD to speak with pt's family regarding discharge disposition. [name] available to help with discharge planning if SNF placement needed.   Assessment/plan status:  Psychosocial Support/Ongoing Assessment of Needs Other assessment/ plan:   none.   Information/referral to community resources:   Possible SNF bed offers, awaiting MD and family conversation.    PATIENTS/FAMILYS RESPONSE TO PLAN OF CARE: Pt was never clear with [name] about whether he was agreeable to SNF. Pt did state that "nursing homes won't let me be active, and God says I have to remain active or my heart will stop." Pt was very pleasant to speak with and was agreeable to [name] "checking in" on him on 10/25/2013.       [name], [place] Social Worker [phone]

## 2013-10-24 NOTE — Evaluation (Signed)
Physical Therapy Evaluation Patient Details Name: [name] MRN: [id number] DOB: [birth year] Today's Date: 10/24/2013 Time: 2694-8546 PT Time Calculation (min): 28 min  PT Assessment / Plan / Recommendation History of Present Illness  pt presents with L LE numbness and to r/o CVA.  pt with hx of recent admit with LE injuries.    Clinical Impression  Pt difficult to get exact answers out of when asking about PLOF and home set-up.  It sounds like pt does not have 24hr A and at this time pt needs 24hr care due to physical limitations and possibly cognitive limitations.  Feel SNF is safest option.  Will continue to follow.      PT Assessment  Patient needs continued PT services    Follow Up Recommendations  SNF    Does the patient have the potential to tolerate intense rehabilitation      Barriers to Discharge Decreased caregiver support      Equipment Recommendations  None recommended by PT    Recommendations for Other Services     Frequency Min 3X/week    Precautions / Restrictions Precautions Precautions: Fall Restrictions Weight Bearing Restrictions: No   Pertinent Vitals/Pain Indicates his whole body always hurts.        Mobility  Bed Mobility Overal bed mobility: Needs Assistance Bed Mobility: Supine to Sit Supine to sit: Supervision General bed mobility comments: moves slowly and used rail Transfers Overall transfer level: Needs assistance Equipment used: Rolling walker (2 wheeled) Transfers: Sit to/from Stand Sit to Stand: Min assist General transfer comment: cues for technique and A for coming to stand.   Ambulation/Gait Ambulation/Gait assistance: Min guard Ambulation Distance (Feet): 140 Feet Assistive device: Rolling walker (2 wheeled) Gait Pattern/deviations: Step-through pattern;Decreased stride length;Decreased dorsiflexion - right;Trunk flexed Gait velocity interpretation: Below normal speed for age/gender General Gait Details: pt moves  slowly and leans heavily on RW.      Exercises     PT Diagnosis: Difficulty walking;Generalized weakness  PT Problem List: Decreased strength;Decreased activity tolerance;Decreased balance;Decreased coordination;Decreased mobility;Decreased cognition;Decreased knowledge of use of DME;Decreased safety awareness PT Treatment Interventions: DME instruction;Gait training;Stair training;Functional mobility training;Therapeutic activities;Therapeutic exercise;Balance training;Neuromuscular re-education;Patient/family education     PT Goals(Current goals can be found in the care plan section) Acute Rehab PT Goals Patient Stated Goal: None stated PT Goal Formulation: With patient Time For Goal Achievement: 11/07/13 Potential to Achieve Goals: Fair  Visit Information  Last PT Received On: 10/24/13 Assistance Needed: +1 History of Present Illness: pt presents with L LE numbness and to r/o CVA.  pt with hx of recent admit with LE injuries.         Prior Functioning  Home Living Family/patient expects to be discharged to:: Private residence Living Arrangements: Children;Other relatives Available Help at Discharge: Family;Available PRN/intermittently Type of Home: House Home Access: Ramped entrance Home Layout: One level Home Equipment: [place]rew - 4 wheels;Cane - single point;Electric scooter;Shower seat - built in;Grab bars - toilet;Adaptive equipment Adaptive Equipment: Reacher Prior Function Level of Independence: Needs assistance Gait / Transfers Assistance Needed: son does the cooking and shopping Comments: pt drives and reports he was independent with bathing and dressing Communication Communication: Expressive difficulties;HOH    Cognition  Cognition Arousal/Alertness: Awake/alert Behavior During Therapy: WFL for tasks assessed/performed Overall Cognitive Status: No family/caregiver present to determine baseline cognitive functioning    Extremity/Trunk Assessment Upper Extremity  Assessment Upper Extremity Assessment: Defer to OT evaluation Lower Extremity Assessment Lower Extremity Assessment: Generalized weakness   Balance Balance Overall  balance assessment: History of Falls  End of Session PT - End of Session Equipment Utilized During Treatment: Gait belt Activity Tolerance: Patient limited by fatigue Patient left: in chair;with call bell/phone within reach;with chair alarm set Nurse Communication: Mobility status  GP Functional Assessment Tool Used: Clinical Judgement Functional Limitation: Mobility: Walking and moving around Mobility: Walking and Moving Around Current Status ([id number]): At least 20 percent but less than 40 percent impaired, limited or restricted Mobility: Walking and Moving Around Goal Status [phone]): At least 1 percent but less than 20 percent impaired, limited or restricted   Catarina Hartshorn, [place] 10/24/2013, 2:56 PM

## 2013-10-25 DIAGNOSIS — M129 Arthropathy, unspecified: Secondary | ICD-10-CM

## 2013-10-25 LAB — GLUCOSE, CAPILLARY
GLUCOSE-CAPILLARY: 113 mg/dL — AB (ref 70–99)
GLUCOSE-CAPILLARY: 165 mg/dL — AB (ref 70–99)
Glucose-Capillary: 129 mg/dL — ABNORMAL HIGH (ref 70–99)
Glucose-Capillary: 115 mg/dL — ABNORMAL HIGH (ref 70–99)

## 2013-10-25 NOTE — Progress Notes (Signed)
[facility] PROGRESS NOTE  [name] [id number] DOB: 1931/09/15 DOA: 10/23/2013 PCP: Leola Brazil, MD  Assessment/Plan: TIA (transient ischemic attack)  -patient thinks related to knee pain -CT scan is negative  -MRI negative PT/OT- SNf but after long talks with patient and son- would like to go home with home health- had [facility]- spoke briefly with [facility]- has been doing well at home  Irregular heart rhythm  Patient has been evaluated by cardiology during last admission who was under the opinion that the patient appears to have sinus bradycardia with very small P wave which could appear like atrial fibrillation.  -serial troponins.    Hypertension  Continue Coreg, amlodipine, hydralazine   Diabetes mellitus  sliding scale   Code Status: full Family Communication: patient/son on phone Disposition Plan: obs   Consultants:  none  Procedures:    Antibiotics:    HPI/Subjective: Wanting to go home Has follow up at [place]  Objective: Filed Vitals:   10/25/13 1051  BP: 97/54  Pulse: 63  Temp:   Resp:     Intake/Output Summary (Last 24 hours) at 10/25/13 1245 Last data filed at 10/25/13 0900  Gross per 24 hour  Intake    240 ml  Output      2 ml  Net    238 ml   Filed Weights   10/23/13 2321  Weight: 103.4 kg (227 lb 15.3 oz)    Exam:  General: Alert, Awake and Oriented to Time, Place and Person. Very religious Cardiovascular: S1 and S2 Present, aortic systolic Murmur, Peripheral Pulses Present  Respiratory: Bilateral Air entry equal and Decreased, Clear to Auscultation,  No Crackles, no wheezes  Abdomen: Bowel Sound Present, Soft and Non tender  Extremities: Bilateral Pedal edema, bilateral open wound on shin, no calf tenderness; right knee pain; patient says slurred speech is his baseline Neurologic: right foot drop which is chronic    Data Reviewed: Basic Metabolic Panel:  Recent Labs Lab 10/23/13 1700 10/24/13 0730   NA 137 134*  K 4.0 4.2  CL 102 101  CO2 22 21  GLUCOSE 71 157*  BUN 41* 36*  CREATININE 1.75* 1.50*  CALCIUM 9.0 8.9   Liver Function Tests:  Recent Labs Lab 10/23/13 1700 10/24/13 0730  AST 33 33  ALT 22 21  ALKPHOS 154* 158*  BILITOT 0.2* 0.3  PROT 7.5 7.2  ALBUMIN 3.2* 3.0*    Recent Labs Lab 10/23/13 1700  LIPASE 117*   No results found for this basename: AMMONIA,  in the last 168 hours CBC:  Recent Labs Lab 10/23/13 1700 10/24/13 0730  WBC 5.3 4.7  NEUTROABS 2.4 2.2  HGB 10.0* 9.7*  HCT 30.1* 29.7*  MCV 90.1 90.8  PLT 229 225   Cardiac Enzymes:  Recent Labs Lab 10/23/13 1700  TROPONINI <0.30   BNP (last 3 results)  Recent Labs  10/02/13 0515 10/05/13 0258 10/23/13 1700  PROBNP 1567.0* 1056.0* 1346.0*   CBG:  Recent Labs Lab 10/24/13 0643 10/24/13 1126 10/24/13 1751 10/24/13 2010 10/25/13 0623  GLUCAP 167* 133* 114* 165* 129*    No results found for this or any previous visit (from the past 240 hour(s)).   Studies: Ct Abdomen Pelvis Wo Contrast  10/23/2013   CLINICAL DATA:  Abdominal pain and distention. Elevated serum lipase. PA edema in the left lower extremity.  EXAM: CT ABDOMEN AND PELVIS WITHOUT CONTRAST  TECHNIQUE: Multidetector CT imaging of the abdomen and pelvis was performed following the standard protocol without  intravenous contrast.  COMPARISON:  Unenhanced CT abdomen and pelvis 08/25/2012, 10/27/2011, 10/25/2010.  FINDINGS: Beam hardening streak artifact as the patient was unable raise the arms. Prior anterior abdominal wall hernia repair without evidence of recurrence. Prior partial gastrectomy and gastrojejunostomy. Remaining stomach normal in appearance. Normal appearing small bowel. Diverticulosis involving the entire colon without evidence of acute diverticulitis. Wall thickening involving the sigmoid colon is likely a combination of underdistention and chronic diverticular disease resulting in muscular hypertrophy.  Normal appendix in the right mid abdomen and right upper pelvis. No ascites.  Calcified granuloma in the anterior segment right lobe of liver. Relative enlargement of the left lobe and caudate lobe. Within the limits of the unenhanced technique, no significant focal hepatic parenchymal abnormality. Normal unenhanced appearance of the spleen and left adrenal gland. Moderate pancreatic atrophy. Dystrophic calcification involving the right adrenal gland. Gallstones within the otherwise normal-appearing gallbladder. No biliary ductal dilation. Numerous bilateral renal cysts, some of which are likely proteinaceous, as noted previously. No hydronephrosis. No significant lymphadenopathy. Moderate aortoiliofemoral atherosclerosis without aneurysm.  Urinary bladder unremarkable. Mild median lobe prostate gland enlargement. Numerous pelvic phleboliths.  Bone window images demonstrate severe degenerative changes involving the lower thoracic and lumbar spine, ankylosis of the sacroiliac joints, and degenerative changes in the hips. Ground-glass airspace opacities in the visualized right middle lobe. Visualized lung bases otherwise clear. Heart moderately enlarged with 3 vessel coronary atherosclerosis.  IMPRESSION: 1. No acute abnormalities involving the abdomen or pelvis. 2. Extensive diffuse colonic diverticulosis without evidence of acute diverticulitis. 3. Query hepatic cirrhosis as there is relative enlargement of the left lobe and caudate lobe compared to the right lobe. 4. Cholelithiasis without CT evidence of acute cholecystitis. 5. Stable moderate pancreatic atrophy. 6. Dystrophic calcification involving the right adrenal gland. 7. Multiple bilateral renal cysts. 8. Mild median lobe prostate gland enlargement. 9. Pure ground-glass airspace opacity in the visualized right middle lobe. This is nonspecific and can be related to infection, inflammation, or early adenocarcinoma. Initial follow-up by chest CT without  contrast is recommended in 3 months to confirm persistence. This recommendation follows the consensus statement: Recommendations for the Management of Subsolid Pulmonary Nodules Detected at CT: A Statement from the [place] as published in Radiology 2013; 266:304-317.   Electronically Signed   By: Evangeline Dakin M.D.   On: 10/23/2013 21:55   Dg Chest 2 View  10/23/2013   CLINICAL DATA:  Weakness, pain, increased shortness of breath at night, history hypertension, diabetes, CHF, past history lymphoma  EXAM: CHEST  2 VIEW  COMPARISON:  10/01/2013  FINDINGS: Enlargement of cardiac silhouette.  Mediastinal contours and pulmonary vascularity normal.  Lungs clear.  No pleural effusion or pneumothorax.  Right shoulder prosthesis.  IMPRESSION: Enlargement of cardiac silhouette.  No acute abnormalities.   Electronically Signed   By: Lavonia Dana M.D.   On: 10/23/2013 16:45   Ct Head Wo Contrast  10/23/2013   CLINICAL DATA:  Numbness in the left lower extremity.  EXAM: CT HEAD WITHOUT CONTRAST  TECHNIQUE: Contiguous axial images were obtained from the base of the skull through the vertex without intravenous contrast.  COMPARISON:  MRI dated 04/24/2013 and CT dated 04/23/2013  FINDINGS: No mass lesion. No midline shift. No acute hemorrhage or hematoma. No extra-axial fluid collections. No evidence of acute infarction. Ventricles are normal. Minimal atrophy. No osseous abnormality.  IMPRESSION: Essentially normal exam for age.   Electronically Signed   By: Rozetta Nunnery M.D.   On: 10/23/2013 16:32  Mri Brain Without Contrast  10/24/2013   CLINICAL DATA:  Left lower extremity numbness and edema.  EXAM: MRI HEAD WITHOUT CONTRAST  MRA HEAD WITHOUT CONTRAST  TECHNIQUE: Multiplanar, multiecho pulse sequences of the brain and surrounding structures were obtained without intravenous contrast. Angiographic images of the head were obtained using MRA technique without contrast.  COMPARISON:  CT HEAD W/O CM dated  10/23/2013; CT C SPINE W/O CM dated 09/25/2013; MR HEAD W/O CM dated 04/24/2013; CT HEAD W/O CM dated 04/23/2013  FINDINGS: MRI HEAD FINDINGS  The patient was unable to remain motionless for the exam. Small or subtle lesions could be overlooked.  No evidence for acute infarction, hemorrhage, mass lesion, hydrocephalus, or extra-axial fluid. Moderate atrophy. Mild subcortical and periventricular T2 and FLAIR hyperintensities, likely chronic microvascular ischemic change. Partial empty sella. Mild tonsillar ectopia without frank Chiari I malformation. Marked pannus surrounding the C1-C2 articulation, increased from priors. No cervicomedullary compression. Flow voids are maintained. Bilateral cataract extraction. No acute sinus or mastoid disease.  Compared with priors, a similar appearance is noted.  MRA HEAD FINDINGS  There is asymmetric flow diminution in the left cervical, which is artifactual due to patient motion and positioning. Caliber internal carotid arteries is equal bilaterally without visual flow reducing stenosis. Basilar artery widely patent with left vertebral is sole contributor; right vertebral ends in PICA. Within limits of detection on this motion degraded exam, and no intracranial stenosis or aneurysm.  IMPRESSION: Moderate atrophy, mild small vessel disease, mild tonsillar ectopia, but no acute intracranial findings.  Increased pannus compared with 2011, but no frank cervicomedullary compression.  No intracranial flow reducing lesion is evident.   Electronically Signed   By: Rolla Flatten M.D.   On: 10/24/2013 17:40   Dg Abd 2 Views  10/23/2013   CLINICAL DATA:  Andominal pain and distention, history hypertension, diabetes, lymphoma post partial gastrectomy, CHF, kidney stones  EXAM: ABDOMEN - 2 VIEW  COMPARISON:  03/05/2012  FINDINGS: Supine view limited by body habitus.  No gross evidence of bowel distention or bowel wall thickening.  Bones demineralized.  No evidence of bowel obstruction.  No  ureter calcifications or free intraperitoneal air seen.  IMPRESSION: Nonspecific bowel gas pattern.   Electronically Signed   By: Lavonia Dana M.D.   On: 10/23/2013 18:39   Mr Jodene Nam Head/brain Wo Cm  10/24/2013   CLINICAL DATA:  Left lower extremity numbness and edema.  EXAM: MRI HEAD WITHOUT CONTRAST  MRA HEAD WITHOUT CONTRAST  TECHNIQUE: Multiplanar, multiecho pulse sequences of the brain and surrounding structures were obtained without intravenous contrast. Angiographic images of the head were obtained using MRA technique without contrast.  COMPARISON:  CT HEAD W/O CM dated 10/23/2013; CT C SPINE W/O CM dated 09/25/2013; MR HEAD W/O CM dated 04/24/2013; CT HEAD W/O CM dated 04/23/2013  FINDINGS: MRI HEAD FINDINGS  The patient was unable to remain motionless for the exam. Small or subtle lesions could be overlooked.  No evidence for acute infarction, hemorrhage, mass lesion, hydrocephalus, or extra-axial fluid. Moderate atrophy. Mild subcortical and periventricular T2 and FLAIR hyperintensities, likely chronic microvascular ischemic change. Partial empty sella. Mild tonsillar ectopia without frank Chiari I malformation. Marked pannus surrounding the C1-C2 articulation, increased from priors. No cervicomedullary compression. Flow voids are maintained. Bilateral cataract extraction. No acute sinus or mastoid disease.  Compared with priors, a similar appearance is noted.  MRA HEAD FINDINGS  There is asymmetric flow diminution in the left cervical, which is artifactual due to patient motion  and positioning. Caliber internal carotid arteries is equal bilaterally without visual flow reducing stenosis. Basilar artery widely patent with left vertebral is sole contributor; right vertebral ends in PICA. Within limits of detection on this motion degraded exam, and no intracranial stenosis or aneurysm.  IMPRESSION: Moderate atrophy, mild small vessel disease, mild tonsillar ectopia, but no acute intracranial findings.  Increased  pannus compared with 2011, but no frank cervicomedullary compression.  No intracranial flow reducing lesion is evident.   Electronically Signed   By: Rolla Flatten M.D.   On: 10/24/2013 17:40    Scheduled Meds: . allopurinol  100 mg Oral Daily  . aspirin  325 mg Oral Daily  . carvedilol  12.5 mg Oral BID WC  . cycloSPORINE  1 drop Both Eyes BID  . docusate sodium  100 mg Oral BID  . gabapentin  100 mg Oral BID  . heparin  5,000 Units Subcutaneous 3 times per day  . hydrALAZINE  25 mg Oral 3 times per day  . insulin aspart  0-15 Units Subcutaneous TID WC  . insulin aspart  0-5 Units Subcutaneous QHS  . isosorbide dinitrate  20 mg Oral TID  . sertraline  200 mg Oral QHS  . simvastatin  10 mg Oral QHS  . torsemide  20 mg Oral Daily  . traZODone  50 mg Oral QHS   Continuous Infusions:   Principal Problem:   TIA (transient ischemic attack) Active Problems:   HTN (hypertension)   Diabetes mellitus   Chronic back pain   CKD (chronic kidney disease) stage 3, GFR 30-59 ml/min   Osteoarthritis   Paresthesias    Time spent: [place], [facility] Pager [phone]. If 7PM-7AM, please contact night-coverage at [email or web address], password [facility] 10/25/2013, 12:45 PM  LOS: 2 days

## 2013-10-25 NOTE — Progress Notes (Signed)
[place] called to deliver rollator/ rolling walker with seat to patient's room today prior to discharge; Aneta Mins [phone]

## 2013-10-25 NOTE — Clinical Social Work Note (Signed)
MD and RNCM reported to [place] that pt is to return home on 10/26/2013 with home health care. [name] signing off. Please re consult if discharge disposition changes.  Pati Gallo, [place] Social Worker [phone]

## 2013-10-25 NOTE — Progress Notes (Signed)
Patient is active with [facility] as prior to admission for HHRN/ PT/ nurses aide; Aneta Mins [phone]

## 2013-10-25 NOTE — Progress Notes (Addendum)
Spoke to Dr Eliseo Squires in person regarding patient's SBP 87, patient asymptomatic in chair, feeling "fine", orders received. Will continue to monitor  SBP now 97 (at 1045)

## 2013-10-26 LAB — GLUCOSE, CAPILLARY
GLUCOSE-CAPILLARY: 110 mg/dL — AB (ref 70–99)
Glucose-Capillary: 112 mg/dL — ABNORMAL HIGH (ref 70–99)

## 2013-10-26 MED ORDER — PANTOPRAZOLE SODIUM 40 MG PO TBEC: 40.0000 mg | DELAYED_RELEASE_TABLET | Freq: Every day | ORAL | Status: DC

## 2013-10-26 MED ORDER — PANTOPRAZOLE SODIUM 40 MG PO TBEC
40.0000 mg | DELAYED_RELEASE_TABLET | Freq: Every day | ORAL | Status: DC
  Administered 2013-10-26: 40 mg via ORAL
  Filled 2013-10-26: qty 1

## 2013-10-26 NOTE — Progress Notes (Signed)
Agree with PTA.    Ceaser Ebeling, PT [phone]  

## 2013-10-26 NOTE — Progress Notes (Signed)
Pt. [place] home via car with family member. DC instructions and prescriptions given to patient.  Vital signs and assessments were stable.

## 2013-10-26 NOTE — Discharge Summary (Addendum)
Physician Discharge Summary  [name] LYY:[id number] DOB: Jul 23, 1932 DOA: 10/23/2013  PCP: Leola Brazil, MD  Admit date: 10/23/2013 Discharge date: 10/26/2013  Time spent: 35 minutes  Recommendations for Outpatient Follow-up:  1. No driving 2. Home health 3. 24 hour care 4. Cbc, bmp 1 week  Discharge Diagnoses:  Principal Problem:   TIA (transient ischemic attack) Active Problems:   HTN (hypertension)   Diabetes mellitus   Chronic back pain   CKD (chronic kidney disease) stage 3, GFR 30-59 ml/min   Osteoarthritis   Paresthesias   Discharge Condition: improved  Diet recommendation: cardiac/carb mod  Filed Weights   10/23/13 2321  Weight: 103.4 kg (227 lb 15.3 oz)    History of present illness:  [name] is a 80 y.o. male with Past medical history of osteoarthritis, diabetes, gout, chronic diastolic dysfunction, chronic kidney disease.  The patient is coming from home.  The patient presented with complaints of left leg numbness below the knee that has been ongoing since 8:00 in the morning. Patient woke up earlier without any complaint and was at rest when the symptoms started. The numbness did not spread further but remained there until he came to the hospital. His symptoms are gradually improved at present on my evaluation.  He denies any headache, dizziness, blurring of vision, chest pain, shortness of breath, nausea, vomiting, abdominal pain, diarrhea, burning urination, active bleeding, incontinence, focal deficit on the upper extremity.  He is compliant with his medications.   Hospital Course:  TIA (transient ischemic attack)  -patient thinks related to knee pain  -CT scan is negative  -MRI negative  Resolved PT/OT- recommends SNf but after long talks with patient and son- would like to go home with home health- had [facility]- spoke briefly with [facility]- has been doing well at home   Irregular heart rhythm  Patient has been evaluated by  cardiology during last admission who was under the opinion that the patient appears to have sinus bradycardia with very small P wave which could appear like atrial fibrillation.  -serial troponins negative  Hypertension  Continue Coreg, hydralazine - d/c norvasc  Diabetes mellitus  Resume home meds   Procedures:    Consultations:  none  Discharge Exam: Filed Vitals:   10/26/13 0511  BP: 146/73  Pulse: 70  Temp: 97.6 F (36.4 C)  Resp: 20    General: pleasant/cooperative Cardiovascular: rrr Respiratory: clear anterior  Discharge Instructions      Discharge Orders   Future Orders Complete By Expires   Diet - low sodium heart healthy  As directed    Diet Carb Modified  As directed    Discharge instructions  As directed    Comments:     Home health and 24 hour supervision by family No driving   Increase activity slowly  As directed        Medication List    STOP taking these medications       amLODipine 5 MG tablet  Commonly known as:  NORVASC     HYDROcodone-acetaminophen 5-325 MG per tablet  Commonly known as:  NORCO/VICODIN      TAKE these medications       acetaminophen 325 MG tablet  Commonly known as:  TYLENOL  Take 325 mg by mouth every 6 (six) hours as needed for pain.     albuterol 108 (90 BASE) MCG/ACT inhaler  Commonly known as:  PROVENTIL HFA;VENTOLIN HFA  Inhale 2 puffs into the lungs every 6 (six)  hours as needed for wheezing or shortness of breath.     allopurinol 100 MG tablet  Commonly known as:  ZYLOPRIM  Take 1 tablet (100 mg total) by mouth daily.     aspirin EC 81 MG tablet  Take 1 tablet (81 mg total) by mouth daily.     bisacodyl 5 MG EC tablet  Commonly known as:  DULCOLAX  Take 5 mg by mouth daily as needed for constipation.     carvedilol 12.5 MG tablet  Commonly known as:  COREG  Take 1 tablet (12.5 mg total) by mouth 2 (two) times daily with a meal.     cycloSPORINE 0.05 % ophthalmic emulsion  Commonly known  as:  RESTASIS  Place 1 drop into both eyes 2 (two) times daily.     docusate sodium 100 MG capsule  Commonly known as:  COLACE  Take 1 capsule (100 mg total) by mouth every 12 (twelve) hours.     gabapentin 100 MG capsule  Commonly known as:  NEURONTIN  Take 100 mg by mouth 2 (two) times daily.     glipiZIDE 10 MG tablet  Commonly known as:  GLUCOTROL  Take 10 mg by mouth daily.     hydrALAZINE 25 MG tablet  Commonly known as:  APRESOLINE  Take 25 mg by mouth 3 (three) times daily.     hydroxypropyl methylcellulose 2.5 % ophthalmic solution  Commonly known as:  ISOPTO TEARS  Place 1 drop into both eyes 3 (three) times daily as needed (dry eyes).     isosorbide dinitrate 20 MG tablet  Commonly known as:  ISORDIL  Take 20 mg by mouth 3 (three) times daily.     niacin 500 MG tablet  Take 1,000 mg by mouth at bedtime.     oxyCODONE-acetaminophen 5-325 MG per tablet  Commonly known as:  PERCOCET  Take 1-2 tablets by mouth every 4 (four) hours as needed.     pantoprazole 40 MG tablet  Commonly known as:  PROTONIX  Take 1 tablet (40 mg total) by mouth daily.     polyethylene glycol packet  Commonly known as:  MIRALAX / GLYCOLAX  Take 17 g by mouth daily as needed (constipation). For constipation     potassium chloride SA 20 MEQ tablet  Commonly known as:  K-DUR,KLOR-CON  Take 1 tablet (20 mEq total) by mouth daily.     sertraline 100 MG tablet  Commonly known as:  ZOLOFT  Take 200 mg by mouth at bedtime.     simvastatin 20 MG tablet  Commonly known as:  ZOCOR  Take 0.5 tablets (10 mg total) by mouth at bedtime.     torsemide 20 MG tablet  Commonly known as:  DEMADEX  Take 20 mg by mouth daily.     traZODone 50 MG tablet  Commonly known as:  DESYREL  Take 50 mg by mouth at bedtime.       No Known Allergies    The results of significant diagnostics from this hospitalization (including imaging, microbiology, ancillary and laboratory) are listed below for  reference.    Significant Diagnostic Studies: Ct Abdomen Pelvis Wo Contrast  10/23/2013   CLINICAL DATA:  Abdominal pain and distention. Elevated serum lipase. PA edema in the left lower extremity.  EXAM: CT ABDOMEN AND PELVIS WITHOUT CONTRAST  TECHNIQUE: Multidetector CT imaging of the abdomen and pelvis was performed following the standard protocol without intravenous contrast.  COMPARISON:  Unenhanced CT abdomen and pelvis 08/25/2012, 10/27/2011,  10/25/2010.  FINDINGS: Beam hardening streak artifact as the patient was unable raise the arms. Prior anterior abdominal wall hernia repair without evidence of recurrence. Prior partial gastrectomy and gastrojejunostomy. Remaining stomach normal in appearance. Normal appearing small bowel. Diverticulosis involving the entire colon without evidence of acute diverticulitis. Wall thickening involving the sigmoid colon is likely a combination of underdistention and chronic diverticular disease resulting in muscular hypertrophy. Normal appendix in the right mid abdomen and right upper pelvis. No ascites.  Calcified granuloma in the anterior segment right lobe of liver. Relative enlargement of the left lobe and caudate lobe. Within the limits of the unenhanced technique, no significant focal hepatic parenchymal abnormality. Normal unenhanced appearance of the spleen and left adrenal gland. Moderate pancreatic atrophy. Dystrophic calcification involving the right adrenal gland. Gallstones within the otherwise normal-appearing gallbladder. No biliary ductal dilation. Numerous bilateral renal cysts, some of which are likely proteinaceous, as noted previously. No hydronephrosis. No significant lymphadenopathy. Moderate aortoiliofemoral atherosclerosis without aneurysm.  Urinary bladder unremarkable. Mild median lobe prostate gland enlargement. Numerous pelvic phleboliths.  Bone window images demonstrate severe degenerative changes involving the lower thoracic and lumbar spine,  ankylosis of the sacroiliac joints, and degenerative changes in the hips. Ground-glass airspace opacities in the visualized right middle lobe. Visualized lung bases otherwise clear. Heart moderately enlarged with 3 vessel coronary atherosclerosis.  IMPRESSION: 1. No acute abnormalities involving the abdomen or pelvis. 2. Extensive diffuse colonic diverticulosis without evidence of acute diverticulitis. 3. Query hepatic cirrhosis as there is relative enlargement of the left lobe and caudate lobe compared to the right lobe. 4. Cholelithiasis without CT evidence of acute cholecystitis. 5. Stable moderate pancreatic atrophy. 6. Dystrophic calcification involving the right adrenal gland. 7. Multiple bilateral renal cysts. 8. Mild median lobe prostate gland enlargement. 9. Pure ground-glass airspace opacity in the visualized right middle lobe. This is nonspecific and can be related to infection, inflammation, or early adenocarcinoma. Initial follow-up by chest CT without contrast is recommended in 3 months to confirm persistence. This recommendation follows the consensus statement: Recommendations for the Management of Subsolid Pulmonary Nodules Detected at CT: A Statement from the [place] as published in Radiology 2013; 266:304-317.   Electronically Signed   By: Evangeline Dakin M.D.   On: 10/23/2013 21:55   Dg Chest 2 View  10/23/2013   CLINICAL DATA:  Weakness, pain, increased shortness of breath at night, history hypertension, diabetes, CHF, past history lymphoma  EXAM: CHEST  2 VIEW  COMPARISON:  10/01/2013  FINDINGS: Enlargement of cardiac silhouette.  Mediastinal contours and pulmonary vascularity normal.  Lungs clear.  No pleural effusion or pneumothorax.  Right shoulder prosthesis.  IMPRESSION: Enlargement of cardiac silhouette.  No acute abnormalities.   Electronically Signed   By: Lavonia Dana M.D.   On: 10/23/2013 16:45   Dg Chest 2 View  10/01/2013   CLINICAL DATA:  Lower extremity edema.   EXAM: CHEST  2 VIEW  COMPARISON:  Chest radiograph September 25, 2013  FINDINGS: Cardiac silhouette appears upper limits of normal, tortuous and possibly ectatic aorta is unchanged. Mild chronic interstitial changes without pleural effusions or focal consolidations. Increased lung volumes. No pneumothorax.  Status post right humeral arthroplasty. Severe degenerative change of the left shoulder. Mild degenerative change of thoracolumbar spine.  IMPRESSION: Borderline cardiomegaly and mild COPD without acute pulmonary process.   Electronically Signed   By: Elon Alas   On: 10/01/2013 03:24   Ct Head Wo Contrast  10/23/2013   CLINICAL DATA:  Numbness  in the left lower extremity.  EXAM: CT HEAD WITHOUT CONTRAST  TECHNIQUE: Contiguous axial images were obtained from the base of the skull through the vertex without intravenous contrast.  COMPARISON:  MRI dated 04/24/2013 and CT dated 04/23/2013  FINDINGS: No mass lesion. No midline shift. No acute hemorrhage or hematoma. No extra-axial fluid collections. No evidence of acute infarction. Ventricles are normal. Minimal atrophy. No osseous abnormality.  IMPRESSION: Essentially normal exam for age.   Electronically Signed   By: Rozetta Nunnery M.D.   On: 10/23/2013 16:32   Mri Brain Without Contrast  10/24/2013   CLINICAL DATA:  Left lower extremity numbness and edema.  EXAM: MRI HEAD WITHOUT CONTRAST  MRA HEAD WITHOUT CONTRAST  TECHNIQUE: Multiplanar, multiecho pulse sequences of the brain and surrounding structures were obtained without intravenous contrast. Angiographic images of the head were obtained using MRA technique without contrast.  COMPARISON:  CT HEAD W/O CM dated 10/23/2013; CT C SPINE W/O CM dated 09/25/2013; MR HEAD W/O CM dated 04/24/2013; CT HEAD W/O CM dated 04/23/2013  FINDINGS: MRI HEAD FINDINGS  The patient was unable to remain motionless for the exam. Small or subtle lesions could be overlooked.  No evidence for acute infarction, hemorrhage, mass  lesion, hydrocephalus, or extra-axial fluid. Moderate atrophy. Mild subcortical and periventricular T2 and FLAIR hyperintensities, likely chronic microvascular ischemic change. Partial empty sella. Mild tonsillar ectopia without frank Chiari I malformation. Marked pannus surrounding the C1-C2 articulation, increased from priors. No cervicomedullary compression. Flow voids are maintained. Bilateral cataract extraction. No acute sinus or mastoid disease.  Compared with priors, a similar appearance is noted.  MRA HEAD FINDINGS  There is asymmetric flow diminution in the left cervical, which is artifactual due to patient motion and positioning. Caliber internal carotid arteries is equal bilaterally without visual flow reducing stenosis. Basilar artery widely patent with left vertebral is sole contributor; right vertebral ends in PICA. Within limits of detection on this motion degraded exam, and no intracranial stenosis or aneurysm.  IMPRESSION: Moderate atrophy, mild small vessel disease, mild tonsillar ectopia, but no acute intracranial findings.  Increased pannus compared with 2011, but no frank cervicomedullary compression.  No intracranial flow reducing lesion is evident.   Electronically Signed   By: Rolla Flatten M.D.   On: 10/24/2013 17:40   Dg Abd 2 Views  10/23/2013   CLINICAL DATA:  Andominal pain and distention, history hypertension, diabetes, lymphoma post partial gastrectomy, CHF, kidney stones  EXAM: ABDOMEN - 2 VIEW  COMPARISON:  03/05/2012  FINDINGS: Supine view limited by body habitus.  No gross evidence of bowel distention or bowel wall thickening.  Bones demineralized.  No evidence of bowel obstruction.  No ureter calcifications or free intraperitoneal air seen.  IMPRESSION: Nonspecific bowel gas pattern.   Electronically Signed   By: Lavonia Dana M.D.   On: 10/23/2013 18:39   Mr Jodene Nam Head/brain Wo Cm  10/24/2013   CLINICAL DATA:  Left lower extremity numbness and edema.  EXAM: MRI HEAD WITHOUT  CONTRAST  MRA HEAD WITHOUT CONTRAST  TECHNIQUE: Multiplanar, multiecho pulse sequences of the brain and surrounding structures were obtained without intravenous contrast. Angiographic images of the head were obtained using MRA technique without contrast.  COMPARISON:  CT HEAD W/O CM dated 10/23/2013; CT C SPINE W/O CM dated 09/25/2013; MR HEAD W/O CM dated 04/24/2013; CT HEAD W/O CM dated 04/23/2013  FINDINGS: MRI HEAD FINDINGS  The patient was unable to remain motionless for the exam. Small or subtle lesions could be  overlooked.  No evidence for acute infarction, hemorrhage, mass lesion, hydrocephalus, or extra-axial fluid. Moderate atrophy. Mild subcortical and periventricular T2 and FLAIR hyperintensities, likely chronic microvascular ischemic change. Partial empty sella. Mild tonsillar ectopia without frank Chiari I malformation. Marked pannus surrounding the C1-C2 articulation, increased from priors. No cervicomedullary compression. Flow voids are maintained. Bilateral cataract extraction. No acute sinus or mastoid disease.  Compared with priors, a similar appearance is noted.  MRA HEAD FINDINGS  There is asymmetric flow diminution in the left cervical, which is artifactual due to patient motion and positioning. Caliber internal carotid arteries is equal bilaterally without visual flow reducing stenosis. Basilar artery widely patent with left vertebral is sole contributor; right vertebral ends in PICA. Within limits of detection on this motion degraded exam, and no intracranial stenosis or aneurysm.  IMPRESSION: Moderate atrophy, mild small vessel disease, mild tonsillar ectopia, but no acute intracranial findings.  Increased pannus compared with 2011, but no frank cervicomedullary compression.  No intracranial flow reducing lesion is evident.   Electronically Signed   By: Rolla Flatten M.D.   On: 10/24/2013 17:40    Microbiology: No results found for this or any previous visit (from the past 240 hour(s)).    Labs: Basic Metabolic Panel:  Recent Labs Lab 10/23/13 1700 10/24/13 0730  NA 137 134*  K 4.0 4.2  CL 102 101  CO2 22 21  GLUCOSE 71 157*  BUN 41* 36*  CREATININE 1.75* 1.50*  CALCIUM 9.0 8.9   Liver Function Tests:  Recent Labs Lab 10/23/13 1700 10/24/13 0730  AST 33 33  ALT 22 21  ALKPHOS 154* 158*  BILITOT 0.2* 0.3  PROT 7.5 7.2  ALBUMIN 3.2* 3.0*    Recent Labs Lab 10/23/13 1700  LIPASE 117*   No results found for this basename: AMMONIA,  in the last 168 hours CBC:  Recent Labs Lab 10/23/13 1700 10/24/13 0730  WBC 5.3 4.7  NEUTROABS 2.4 2.2  HGB 10.0* 9.7*  HCT 30.1* 29.7*  MCV 90.1 90.8  PLT 229 225   Cardiac Enzymes:  Recent Labs Lab 10/23/13 1700  TROPONINI <0.30   BNP: BNP (last 3 results)  Recent Labs  10/02/13 0515 10/05/13 0258 10/23/13 1700  PROBNP 1567.0* 1056.0* 1346.0*   CBG:  Recent Labs Lab 10/25/13 0623 10/25/13 1152 10/25/13 1652 10/25/13 2142 10/26/13 0641  GLUCAP 129* 113* 165* 115* 110*       Signed:  Elonna Mcfarlane  [facility] 10/26/2013, 8:35 AM

## 2013-10-26 NOTE — Progress Notes (Signed)
Physical Therapy Treatment Patient Details Name: [name] MRN: [id number] DOB: June 02, 1932 Today's Date: 10/26/2013 Time: 7628-3151 PT Time Calculation (min): 25 min  PT Assessment / Plan / Recommendation  History of Present Illness pt presents with L LE numbness and to r/o CVA.  pt with hx of recent admit with LE injuries.     PT Comments   Patient agreeable to ambulation with some encouragement. No LOB noted this session. Patient is now planning to DC home with assistance from his son and two grandsons. Patient has ramp to enter his home.   Follow Up Recommendations  Home health PT     Does the patient have the potential to tolerate intense rehabilitation     Barriers to Discharge        Equipment Recommendations  None recommended by PT    Recommendations for Other Services    Frequency Min 3X/week   Progress towards PT Goals Progress towards PT goals: Progressing toward goals  Plan Current plan remains appropriate    Precautions / Restrictions Precautions Precautions: Fall   Pertinent Vitals/Pain no apparent distress     Mobility  Transfers Overall transfer level: Needs assistance Equipment used: Rolling walker (2 wheeled) Sit to Stand: Min guard General transfer comment: cues for technique  Ambulation/Gait Ambulation/Gait assistance: Min guard Ambulation Distance (Feet): 160 Feet Assistive device: Rolling walker (2 wheeled) Gait velocity interpretation: Below normal speed for age/gender General Gait Details: pt moves slowly and leans heavily on RW.      Exercises     PT Diagnosis:    PT Problem List:   PT Treatment Interventions:     PT Goals (current goals can now be found in the care plan section)    Visit Information  Last PT Received On: 10/26/13 Assistance Needed: +1 History of Present Illness: pt presents with L LE numbness and to r/o CVA.  pt with hx of recent admit with LE injuries.      Subjective Data      Cognition   Cognition Arousal/Alertness: Awake/alert Behavior During Therapy: WFL for tasks assessed/performed Overall Cognitive Status: No family/caregiver present to determine baseline cognitive functioning    Balance     End of Session PT - End of Session Equipment Utilized During Treatment: Gait belt Activity Tolerance: Patient tolerated treatment well Patient left: in bed;with bed alarm set Nurse Communication: Mobility status   GP     Jacqualyn Posey 10/26/2013, 9:43 AM 10/26/2013 Jacqualyn Posey PTA [phone] pager [phone] office

## 2013-11-17 ENCOUNTER — Encounter (HOSPITAL_COMMUNITY): Payer: Self-pay

## 2013-11-17 ENCOUNTER — Ambulatory Visit (HOSPITAL_COMMUNITY)
Admission: RE | Admit: 2013-11-17 | Discharge: 2013-11-17 | Disposition: A | Discharge status: Home / Self Care | Payer: Medicare Other | Source: Ambulatory Visit | Attending: Internal Medicine | Admitting: Internal Medicine

## 2013-11-17 VITALS — BP 130/80 | HR 80 | Resp 20 | Wt 231.4 lb

## 2013-11-17 DIAGNOSIS — I10 Essential (primary) hypertension: Secondary | ICD-10-CM

## 2013-11-17 DIAGNOSIS — I5032 Chronic diastolic (congestive) heart failure: Secondary | ICD-10-CM

## 2013-11-17 NOTE — Progress Notes (Signed)
Patient ID: [name], male   DOB: 10/22/1931, 80 y.o.   MRN: [id number]  Primary Physician:Dr Kilpatrick  Primary Cardiologist: Dr. Haroldine Laws  Followed at [facility]: Dr Lake Bells  HPI: [name] is a 80 yo with history of HTN, CKD, DM, chronic diastolic HF (primarily R side).   RHC: 11/16/12  RA mean 19  RV 53/16  PA 56/23, mean 38  PCWP mean 21  Oxygen saturations:  PA 53%  AO 94%  Cardiac Output (Fick) 5.05  Cardiac Index (Fick) 2.36  PVR 3.36 WU   Admitted to [facility] in 3/14 with volume overload. He required Lasix drip and Milrinone. Renal Ultrasound negative for hydronephrosis. Large bilateral renal cysts with normal kidney size. Discharge weight- 243 pounds. Creatinine 2.22   Admitted to [facility] 10/23/13 with LLE numbness. He was evaluated for possible CVA but CT and MRI was negative.  Presumed TIA.   He returns for follow up. Followed by [facility]. Denies SOB/PND/Orthopnea. Weight at home 223 pounds.   Ambulates with a cane. Did not take medications this morning. Following a low salt diet and drinking less than 2L a day. Walks with a cane. Followed by paramedicine. SBP has been < 140.    Labs 07/29/13 K 3.3 Creatinine 1.66  Labs 10/05/12: K 4.8, Creatinine 2.27, pro-BNP 1056 Labs 10/24/13 K 4.2 Creatinine 1.5    ECHO 09/2013: EF 55-60%, mod Roy, RV sys fx nl.   SH: Currently son and grandsons living with him; Does not drink alcohol or smoke.   ROS: All systems negative except as listed in HPI, PMH and Problem List.  Past Medical History  Diagnosis Date  . Pneumonia   . Hypertension   . Hypercholesteremia   . Diabetes mellitus   . Gout   . Irregular heart beat   . Kidney stone   . Lymphoma     s/p partial gastrectomy  . CHF (congestive heart failure)     Current Outpatient Prescriptions  Medication Sig Dispense Refill  . acetaminophen (TYLENOL) 325 MG tablet Take 325 mg by mouth every 6 (six) hours as needed for pain.       Marland Kitchen albuterol (PROVENTIL HFA;VENTOLIN HFA) 108 (90  BASE) MCG/ACT inhaler Inhale 2 puffs into the lungs every 6 (six) hours as needed for wheezing or shortness of breath.       Marland Kitchen amLODipine (NORVASC) 10 MG tablet Take 5 mg by mouth daily.      Marland Kitchen aspirin EC 81 MG tablet Take 1 tablet (81 mg total) by mouth daily.  30 tablet  3  . bisacodyl (DULCOLAX) 5 MG EC tablet Take 5 mg by mouth daily as needed for constipation.       . carvedilol (COREG) 12.5 MG tablet Take 1 tablet (12.5 mg total) by mouth 2 (two) times daily with a meal.  60 tablet  3  . cycloSPORINE (RESTASIS) 0.05 % ophthalmic emulsion Place 1 drop into both eyes 2 (two) times daily.      Marland Kitchen docusate sodium (COLACE) 100 MG capsule Take 1 capsule (100 mg total) by mouth every 12 (twelve) hours.  30 capsule  0  . gabapentin (NEURONTIN) 100 MG capsule Take 100 mg by mouth 2 (two) times daily.      Marland Kitchen glipiZIDE (GLUCOTROL) 10 MG tablet Take 10 mg by mouth daily.        . hydrALAZINE (APRESOLINE) 25 MG tablet Take 50 mg by mouth 3 (three) times daily.       . hydroxypropyl  methylcellulose (ISOPTO TEARS) 2.5 % ophthalmic solution Place 1 drop into both eyes 3 (three) times daily as needed (dry eyes).       . isosorbide dinitrate (ISORDIL) 20 MG tablet Take 20 mg by mouth 3 (three) times daily.      . niacin 500 MG tablet Take 1,000 mg by mouth at bedtime.       Marland Kitchen oxyCODONE-acetaminophen (PERCOCET) 5-325 MG per tablet Take 1-2 tablets by mouth every 4 (four) hours as needed.  20 tablet  0  . pantoprazole (PROTONIX) 40 MG tablet Take 1 tablet (40 mg total) by mouth daily.  30 tablet  0  . polyethylene glycol (MIRALAX / GLYCOLAX) packet Take 17 g by mouth daily as needed (constipation). For constipation      . potassium chloride SA (K-DUR,KLOR-CON) 20 MEQ tablet Take 1 tablet (20 mEq total) by mouth daily.  30 tablet  6  . sertraline (ZOLOFT) 100 MG tablet Take 200 mg by mouth at bedtime.       . simvastatin (ZOCOR) 20 MG tablet Take 0.5 tablets (10 mg total) by mouth at bedtime.  30 tablet  1  .  torsemide (DEMADEX) 20 MG tablet Take 40 mg by mouth daily.       . traZODone (DESYREL) 50 MG tablet Take 50 mg by mouth at bedtime.       No current facility-administered medications for this encounter.   Filed Vitals:   11/17/13 1117 11/17/13 1118  BP: 165/96 130/80  Pulse:  80  Resp:  20  Weight:  231 lb 6 oz (104.951 kg)  SpO2:  100%    PHYSICAL EXAM  General: Elderly. NAD; in wheelchair Neck: JVP 9-10  no thyromegaly or thyroid nodule.  Lungs: Decreased in the bases  CV: Nondisplaced PMI. Heart regular S1/S2, increased P2, no U5/K2, 2/6 systolic murmur LLSB. No carotid bruit.  Abdomen: Nontender. Large scar. +large ventral hernia Neurologic: Alert and oriented x 3. Mild dysarthria Psych: Normal affect.  Extremities: No clubbing or cyanosis. RLE and LLE 1-2+  edema; LLE dresing intact. RLE  with brace.   ASSESSMENT & PLAN:  1) Chronic diastolic CHF: Primarily right side, EF 55-60% (09/2013) - NYHA II symptoms and volume status mildly elevated but he has not had diuretics this am.  stable. Will continue torsemide 40 mg daily.Discussed use of sliding scale diuretics. Instructed to take an extra 20 mg torsemide for weight >3 lbs in a day and 5 lbs in a week. - Reinforced the need and importance of daily weights, a low sodium diet, and fluid restriction (less than 2 L a day). Instructed to call the HF clinic if weight increases more than 3 lbs overnight or 5 lbs in a week.  - Provided with medication bag at clinic to bring all medications to next visit.  - Continue Paramedicine.    2) HTN:  Stable. Continue current regimen BB, norvasc, hydralazine and IMDUR.   Follow up 3-4 months  Rylea Selway NP-C 11:51 AM

## 2013-11-17 NOTE — Patient Instructions (Signed)
Follow up in 3-4 months  Do the following things EVERYDAY: 1) Weigh yourself in the morning before breakfast. Write it down and keep it in a log. 2) Take your medicines as prescribed 3) Eat low salt foods-Limit salt (sodium) to 2000 mg per day.  4) Stay as active as you can everyday 5) Limit all fluids for the day to less than 2 liters 

## 2013-11-24 ENCOUNTER — Telehealth: Payer: Self-pay

## 2013-11-24 NOTE — Telephone Encounter (Signed)
Received a call from [name] stating that had a wound that needed dressing changed by Home Health. He stated that was told he would have to come into Dr Katherine Roan and be seen. Patient sounded a little frantic, concerned, and short of breath.  I called Dr Kilpatrick's office and talked with his nurse about my concerns and Roy. Dygert. I voiced that maybe he couldn't get to the office. Maybe Dr Katherine Roan could send out Home health to evaulate his condition and needs.   PLAN: I will call Roy Strauch back to see whether his issues have been resolved.

## 2013-11-27 ENCOUNTER — Emergency Department (HOSPITAL_COMMUNITY)
Admission: EM | Admit: 2013-11-27 | Discharge: 2013-11-27 | Disposition: A | Discharge status: Home / Self Care | Payer: Medicare Other | Attending: Emergency Medicine | Admitting: Emergency Medicine

## 2013-11-27 ENCOUNTER — Emergency Department (HOSPITAL_COMMUNITY): Payer: Medicare Other

## 2013-11-27 ENCOUNTER — Encounter (HOSPITAL_COMMUNITY): Payer: Self-pay | Admitting: Emergency Medicine

## 2013-11-27 DIAGNOSIS — M109 Gout, unspecified: Secondary | ICD-10-CM | POA: Insufficient documentation

## 2013-11-27 DIAGNOSIS — Z79899 Other long term (current) drug therapy: Secondary | ICD-10-CM | POA: Insufficient documentation

## 2013-11-27 DIAGNOSIS — E119 Type 2 diabetes mellitus without complications: Secondary | ICD-10-CM | POA: Insufficient documentation

## 2013-11-27 DIAGNOSIS — R600 Localized edema: Secondary | ICD-10-CM

## 2013-11-27 DIAGNOSIS — Z87440 Personal history of urinary (tract) infections: Secondary | ICD-10-CM | POA: Insufficient documentation

## 2013-11-27 DIAGNOSIS — E7800 Pure hypercholesterolemia, unspecified: Secondary | ICD-10-CM | POA: Insufficient documentation

## 2013-11-27 DIAGNOSIS — R0989 Other specified symptoms and signs involving the circulatory and respiratory systems: Secondary | ICD-10-CM | POA: Insufficient documentation

## 2013-11-27 DIAGNOSIS — Z7982 Long term (current) use of aspirin: Secondary | ICD-10-CM | POA: Insufficient documentation

## 2013-11-27 DIAGNOSIS — C8589 Other specified types of non-Hodgkin lymphoma, extranodal and solid organ sites: Secondary | ICD-10-CM | POA: Insufficient documentation

## 2013-11-27 DIAGNOSIS — I509 Heart failure, unspecified: Secondary | ICD-10-CM | POA: Insufficient documentation

## 2013-11-27 DIAGNOSIS — R609 Edema, unspecified: Secondary | ICD-10-CM | POA: Insufficient documentation

## 2013-11-27 DIAGNOSIS — I10 Essential (primary) hypertension: Secondary | ICD-10-CM | POA: Insufficient documentation

## 2013-11-27 DIAGNOSIS — I499 Cardiac arrhythmia, unspecified: Secondary | ICD-10-CM | POA: Insufficient documentation

## 2013-11-27 DIAGNOSIS — Z8701 Personal history of pneumonia (recurrent): Secondary | ICD-10-CM | POA: Insufficient documentation

## 2013-11-27 LAB — CBC WITH DIFFERENTIAL/PLATELET
Basophils Absolute: 0 10*3/uL (ref 0.0–0.1)
Basophils Relative: 0 % (ref 0–1)
Eosinophils Absolute: 0.4 10*3/uL (ref 0.0–0.7)
Eosinophils Relative: 6 % — ABNORMAL HIGH (ref 0–5)
HCT: 28.9 % — ABNORMAL LOW (ref 39.0–52.0)
Hemoglobin: 9.7 g/dL — ABNORMAL LOW (ref 13.0–17.0)
LYMPHS ABS: 1.8 10*3/uL (ref 0.7–4.0)
Lymphocytes Relative: 33 % (ref 12–46)
MCH: 29.8 pg (ref 26.0–34.0)
MCHC: 33.6 g/dL (ref 30.0–36.0)
MCV: 88.9 fL (ref 78.0–100.0)
Monocytes Absolute: 0.7 10*3/uL (ref 0.1–1.0)
Monocytes Relative: 12 % (ref 3–12)
Neutro Abs: 2.6 10*3/uL (ref 1.7–7.7)
Neutrophils Relative %: 48 % (ref 43–77)
Platelets: 186 10*3/uL (ref 150–400)
RBC: 3.25 MIL/uL — AB (ref 4.22–5.81)
RDW: 13.8 % (ref 11.5–15.5)
WBC: 5.5 10*3/uL (ref 4.0–10.5)

## 2013-11-27 LAB — COMPREHENSIVE METABOLIC PANEL
ALK PHOS: 135 U/L — AB (ref 39–117)
ALT: 33 U/L (ref 0–53)
AST: 44 U/L — AB (ref 0–37)
Albumin: 3 g/dL — ABNORMAL LOW (ref 3.5–5.2)
BUN: 44 mg/dL — ABNORMAL HIGH (ref 6–23)
CO2: 25 mEq/L (ref 19–32)
Calcium: 8.8 mg/dL (ref 8.4–10.5)
Chloride: 99 mEq/L (ref 96–112)
Creatinine, Ser: 1.88 mg/dL — ABNORMAL HIGH (ref 0.50–1.35)
GFR calc Af Amer: 38 mL/min — ABNORMAL LOW (ref >90)
GFR calc non Af Amer: 32 mL/min — ABNORMAL LOW (ref >90)
GLUCOSE: 109 mg/dL — AB (ref 70–99)
POTASSIUM: 4.6 meq/L (ref 3.7–5.3)
SODIUM: 136 meq/L — AB (ref 137–147)
TOTAL PROTEIN: 6.7 g/dL (ref 6.0–8.3)
Total Bilirubin: 0.2 mg/dL — ABNORMAL LOW (ref 0.3–1.2)

## 2013-11-27 LAB — PRO B NATRIURETIC PEPTIDE: Pro B Natriuretic peptide (BNP): 740.1 pg/mL — ABNORMAL HIGH (ref 0–450)

## 2013-11-27 MED ORDER — FUROSEMIDE 40 MG PO TABS: 40.0000 mg | ORAL_TABLET | Freq: Every day | ORAL | Status: DC

## 2013-11-27 MED ORDER — MORPHINE SULFATE 4 MG/ML IJ SOLN
4.0000 mg | Freq: Once | INTRAMUSCULAR | Status: AC
  Administered 2013-11-27: 4 mg via INTRAVENOUS
  Filled 2013-11-27: qty 1

## 2013-11-27 MED ORDER — FUROSEMIDE 10 MG/ML IJ SOLN
40.0000 mg | Freq: Once | INTRAMUSCULAR | Status: AC
  Administered 2013-11-27: 40 mg via INTRAVENOUS
  Filled 2013-11-27: qty 4

## 2013-11-27 NOTE — ED Provider Notes (Signed)
CSN: [id number]     Arrival date & time 11/27/13  1409 History   First MD Initiated Contact with Patient 11/27/13 1508     Chief Complaint  Patient presents with  . Leg Swelling     (Consider location/radiation/quality/duration/timing/severity/associated sxs/prior Treatment) HPI Comments: Patient is a 80 year old male with history of diabetes, hypertension, congestive heart failure. He presents today with complaints of sores and swelling in his lower extremities. He has been seen here in for similar complaints in the past however is not improving with treatment administered. He denies any chest pain or shortness of breath. He denies any fevers or chills. His legs are quite uncomfortable and makes it difficult to ambulate. There are no aggravating or alleviating factors.  The history is provided by the patient.    Past Medical History  Diagnosis Date  . Pneumonia   . Hypertension   . Hypercholesteremia   . Diabetes mellitus   . Gout   . Irregular heart beat   . Kidney stone   . Lymphoma     s/p partial gastrectomy  . CHF (congestive heart failure)    Past Surgical History  Procedure Laterality Date  . Partial gastrectomy  1994    for lymphoma  . Hernia repair    . Joint replacement    . Back surgery    . Esophagogastroduodenoscopy N/A 10/28/2012    Procedure: ESOPHAGOGASTRODUODENOSCOPY (EGD);  Surgeon: Lear Ng, MD;  Location: [facility];  Service: Endoscopy;  Laterality: N/A;   History reviewed. No pertinent family history. History  Substance Use Topics  . Smoking status: Never Smoker   . Smokeless tobacco: Never Used  . Alcohol Use: No    Review of Systems  All other systems reviewed and are negative.      Allergies  Review of patient's allergies indicates no known allergies.  Home Medications   Current Outpatient Rx  Name  Route  Sig  Dispense  Refill  . albuterol (PROVENTIL HFA;VENTOLIN HFA) 108 (90 BASE) MCG/ACT inhaler   Inhalation  Inhale 2 puffs into the lungs every 6 (six) hours as needed for wheezing or shortness of breath.          Marland Kitchen amLODipine (NORVASC) 10 MG tablet   Oral   Take 5 mg by mouth daily.         Marland Kitchen aspirin EC 81 MG tablet   Oral   Take 1 tablet (81 mg total) by mouth daily.   30 tablet   3   . bisacodyl (DULCOLAX) 5 MG EC tablet   Oral   Take 5 mg by mouth daily as needed for constipation.          . carvedilol (COREG) 12.5 MG tablet   Oral   Take 1 tablet (12.5 mg total) by mouth 2 (two) times daily with a meal.   60 tablet   3   . cycloSPORINE (RESTASIS) 0.05 % ophthalmic emulsion   Both Eyes   Place 1 drop into both eyes 2 (two) times daily.         Marland Kitchen docusate sodium (COLACE) 100 MG capsule   Oral   Take 1 capsule (100 mg total) by mouth every 12 (twelve) hours.   30 capsule   0   . gabapentin (NEURONTIN) 100 MG capsule   Oral   Take 100 mg by mouth 2 (two) times daily.         Marland Kitchen glipiZIDE (GLUCOTROL) 10 MG tablet   Oral  Take 10 mg by mouth daily.           . hydrALAZINE (APRESOLINE) 25 MG tablet   Oral   Take 50 mg by mouth 3 (three) times daily.          . hydroxypropyl methylcellulose (ISOPTO TEARS) 2.5 % ophthalmic solution   Both Eyes   Place 1 drop into both eyes 3 (three) times daily as needed (dry eyes).          . isosorbide dinitrate (ISORDIL) 20 MG tablet   Oral   Take 20 mg by mouth 3 (three) times daily.         . niacin 500 MG tablet   Oral   Take 1,000 mg by mouth at bedtime.          Marland Kitchen oxyCODONE-acetaminophen (PERCOCET) 5-325 MG per tablet   Oral   Take 1-2 tablets by mouth every 4 (four) hours as needed.   20 tablet   0   . pantoprazole (PROTONIX) 40 MG tablet   Oral   Take 1 tablet (40 mg total) by mouth daily.   30 tablet   0   . polyethylene glycol (MIRALAX / GLYCOLAX) packet   Oral   Take 17 g by mouth daily as needed (constipation). For constipation         . potassium chloride SA (K-DUR,KLOR-CON) 20 MEQ  tablet   Oral   Take 1 tablet (20 mEq total) by mouth daily.   30 tablet   6   . sertraline (ZOLOFT) 100 MG tablet   Oral   Take 200 mg by mouth at bedtime.          . torsemide (DEMADEX) 20 MG tablet   Oral   Take 40 mg by mouth daily.          . traZODone (DESYREL) 50 MG tablet   Oral   Take 50 mg by mouth at bedtime.          BP 139/64  Pulse 75  Temp(Src) 98 F (36.7 C) (Oral)  Resp 16  SpO2 100% Physical Exam  Nursing note and vitals reviewed. Constitutional: He is oriented to person, place, and time. He appears well-developed and well-nourished. No distress.  Patient is an elderly male, chronically ill-appearing.  HENT:  Head: Normocephalic and atraumatic.  Mouth/Throat: Oropharynx is clear and moist.  Neck: Normal range of motion. Neck supple.  Cardiovascular: Normal rate, regular rhythm and normal heart sounds.   No murmur heard. Pulmonary/Chest: Effort normal. No respiratory distress. He has no wheezes. He has rales.  Abdominal: Soft. Bowel sounds are normal. He exhibits no distension. There is no tenderness.  Musculoskeletal: Normal range of motion. He exhibits edema.  There is 2+ pitting edema bilateral lower extremities. There are several superficial sores to the anterior tibial regions that are weeping clear to yellow fluid. There is no significant redness or erythema.  Lymphadenopathy:    He has no cervical adenopathy.  Neurological: He is alert and oriented to person, place, and time.  Skin: Skin is warm and dry. He is not diaphoretic.    ED Course  Procedures (including critical care time) Labs Review Labs Reviewed  CBC WITH DIFFERENTIAL  COMPREHENSIVE METABOLIC PANEL  PRO B NATRIURETIC PEPTIDE   Imaging Review No results found.   EKG Interpretation   Date/Time:  Sunday November 27 2013 15:41:30 EDT Ventricular Rate:  60 PR Interval:  304 QRS Duration: 163 QT Interval:  449 QTC Calculation: 449 R  Axis:   49 Text Interpretation:   Sinus rhythm Prolonged PR interval Right bundle  branch block Baseline wander in lead(s) V4 Unchanged from 10/17/13  Confirmed by DELOS  MD, Emmelyn Schmale ([id number]) on 11/27/2013 3:46:02 PM      MDM   Final diagnoses:  None    Patient is a 80 year old male with history of congestive heart failure and diabetes. He presents with leg swelling and weeping from sores on his legs. He has venous stasis and edema on exam, however no evidence for cellulitis or infection. He is afebrile and there is no white count. He was given IV Lasix with good diuresis. His chest x-ray and BNP are consistent with his baseline and do not reflect heart failure. There is no hypoxia and I feel as though he is appropriate for discharge. I will prescribe a dose of Lasix for him to take daily for the next several days and get him to followup with his doctor towards the end of the week.    Veryl Speak, MD 11/27/13 [phone]

## 2013-11-27 NOTE — ED Notes (Signed)
Pt getting IV narcotic. Will keep pt til his ride gets here to pick him up.

## 2013-11-27 NOTE — ED Notes (Signed)
Pt reports being here for swelling to legs for extended amount of time, weeping and wounds to bilateral lower legs. Denies any sob. No acute distress noted at triage.

## 2013-11-27 NOTE — Discharge Instructions (Signed)
Continue your medications as before. Add Lasix 40 mg once daily as prescribed.  Followup with your doctor later on in the week and return to the emergency department if you develop chest pain or difficulty breathing.   Peripheral Edema You have swelling in your legs (peripheral edema). This swelling is due to excess accumulation of salt and water in your body. Edema may be a sign of heart, kidney or liver disease, or a side effect of a medication. It may also be due to problems in the leg veins. Elevating your legs and using special support stockings may be very helpful, if the cause of the swelling is due to poor venous circulation. Avoid long periods of standing, whatever the cause. Treatment of edema depends on identifying the cause. Chips, pretzels, pickles and other salty foods should be avoided. Restricting salt in your diet is almost always needed. Water pills (diuretics) are often used to remove the excess salt and water from your body via urine. These medicines prevent the kidney from reabsorbing sodium. This increases urine flow. Diuretic treatment may also result in lowering of potassium levels in your body. Potassium supplements may be needed if you have to use diuretics daily. Daily weights can help you keep track of your progress in clearing your edema. You should call your caregiver for follow up care as recommended. SEEK IMMEDIATE MEDICAL CARE IF:   You have increased swelling, pain, redness, or heat in your legs.  You develop shortness of breath, especially when lying down.  You develop chest or abdominal pain, weakness, or fainting.  You have a fever. Document Released: 09/25/2004 Document Revised: 11/10/2011 Document Reviewed: 09/05/2009 [facility] Patient Information 2014 [place].

## 2013-11-27 NOTE — ED Notes (Signed)
Patient transported to X-ray 

## 2013-12-01 ENCOUNTER — Telehealth (HOSPITAL_COMMUNITY): Payer: Self-pay | Admitting: *Deleted

## 2013-12-01 NOTE — Telephone Encounter (Signed)
[name] is seeing pt today and called to report he is retaining fluid, she states he has not been weighing himself but she made him get up and weight and his wt is about 4 lbs higher than it was a couple of weeks ago when he last weighed.  She is states his left leg is swollen and he is more SOB, lungs full of rales VS all stable.  Pt was in ER 3/29 and was given IV lasix 80 mg and discharged with lasix 40 mg daily instead of his Torsemide that he is suppose to take.  Discussed all with Dr Aundra Dubin, Daine Floras will give pt 80 mg IV lasix today have pt start back on Torsemide tomorrow, will sch f/u appt for next week.  [name] is aware and agreeable, she will give med and stay with pt for a while after

## 2013-12-01 NOTE — Telephone Encounter (Signed)
Spoke w/[name] she states pt received lasix, VS were stable and pt had put out 200 cc of urine before she left and was feeling a little better

## 2013-12-03 ENCOUNTER — Emergency Department (HOSPITAL_COMMUNITY)
Admission: EM | Admit: 2013-12-03 | Discharge: 2013-12-03 | Disposition: A | Discharge status: Home / Self Care | Payer: Medicare Other | Attending: Emergency Medicine | Admitting: Emergency Medicine

## 2013-12-03 ENCOUNTER — Emergency Department (HOSPITAL_COMMUNITY): Payer: Medicare Other

## 2013-12-03 ENCOUNTER — Encounter (HOSPITAL_COMMUNITY): Payer: Self-pay | Admitting: Emergency Medicine

## 2013-12-03 DIAGNOSIS — E119 Type 2 diabetes mellitus without complications: Secondary | ICD-10-CM | POA: Insufficient documentation

## 2013-12-03 DIAGNOSIS — K458 Other specified abdominal hernia without obstruction or gangrene: Secondary | ICD-10-CM | POA: Insufficient documentation

## 2013-12-03 DIAGNOSIS — Z8572 Personal history of non-Hodgkin lymphomas: Secondary | ICD-10-CM | POA: Insufficient documentation

## 2013-12-03 DIAGNOSIS — M109 Gout, unspecified: Secondary | ICD-10-CM | POA: Insufficient documentation

## 2013-12-03 DIAGNOSIS — R05 Cough: Secondary | ICD-10-CM | POA: Insufficient documentation

## 2013-12-03 DIAGNOSIS — I10 Essential (primary) hypertension: Secondary | ICD-10-CM | POA: Insufficient documentation

## 2013-12-03 DIAGNOSIS — Z87442 Personal history of urinary calculi: Secondary | ICD-10-CM | POA: Insufficient documentation

## 2013-12-03 DIAGNOSIS — L988 Other specified disorders of the skin and subcutaneous tissue: Secondary | ICD-10-CM | POA: Insufficient documentation

## 2013-12-03 DIAGNOSIS — I509 Heart failure, unspecified: Secondary | ICD-10-CM | POA: Insufficient documentation

## 2013-12-03 DIAGNOSIS — Z79899 Other long term (current) drug therapy: Secondary | ICD-10-CM | POA: Insufficient documentation

## 2013-12-03 DIAGNOSIS — I872 Venous insufficiency (chronic) (peripheral): Secondary | ICD-10-CM

## 2013-12-03 DIAGNOSIS — Z8701 Personal history of pneumonia (recurrent): Secondary | ICD-10-CM | POA: Insufficient documentation

## 2013-12-03 DIAGNOSIS — R059 Cough, unspecified: Secondary | ICD-10-CM | POA: Insufficient documentation

## 2013-12-03 DIAGNOSIS — I8310 Varicose veins of unspecified lower extremity with inflammation: Secondary | ICD-10-CM | POA: Insufficient documentation

## 2013-12-03 DIAGNOSIS — Z7982 Long term (current) use of aspirin: Secondary | ICD-10-CM | POA: Insufficient documentation

## 2013-12-03 LAB — URINALYSIS, ROUTINE W REFLEX MICROSCOPIC
Bilirubin Urine: NEGATIVE
Glucose, UA: NEGATIVE mg/dL
Hgb urine dipstick: NEGATIVE
KETONES UR: NEGATIVE mg/dL
Leukocytes, UA: NEGATIVE
NITRITE: NEGATIVE
PH: 5.5 (ref 5.0–8.0)
Protein, ur: 30 mg/dL — AB
Specific Gravity, Urine: 1.013 (ref 1.005–1.030)
UROBILINOGEN UA: 0.2 mg/dL (ref 0.0–1.0)

## 2013-12-03 LAB — BASIC METABOLIC PANEL
BUN: 43 mg/dL — ABNORMAL HIGH (ref 6–23)
CO2: 24 mEq/L (ref 19–32)
CREATININE: 1.98 mg/dL — AB (ref 0.50–1.35)
Calcium: 8.8 mg/dL (ref 8.4–10.5)
Chloride: 96 mEq/L (ref 96–112)
GFR, EST AFRICAN AMERICAN: 35 mL/min — AB (ref >90)
GFR, EST NON AFRICAN AMERICAN: 30 mL/min — AB (ref >90)
GLUCOSE: 125 mg/dL — AB (ref 70–99)
Potassium: 3.7 mEq/L (ref 3.7–5.3)
Sodium: 134 mEq/L — ABNORMAL LOW (ref 137–147)

## 2013-12-03 LAB — CBC WITH DIFFERENTIAL/PLATELET
BASOS PCT: 0 % (ref 0–1)
Basophils Absolute: 0 10*3/uL (ref 0.0–0.1)
EOS PCT: 4 % (ref 0–5)
Eosinophils Absolute: 0.3 10*3/uL (ref 0.0–0.7)
HCT: 29.6 % — ABNORMAL LOW (ref 39.0–52.0)
Hemoglobin: 10 g/dL — ABNORMAL LOW (ref 13.0–17.0)
LYMPHS ABS: 1.7 10*3/uL (ref 0.7–4.0)
Lymphocytes Relative: 18 % (ref 12–46)
MCH: 30 pg (ref 26.0–34.0)
MCHC: 33.8 g/dL (ref 30.0–36.0)
MCV: 88.9 fL (ref 78.0–100.0)
MONO ABS: 0.9 10*3/uL (ref 0.1–1.0)
MONOS PCT: 9 % (ref 3–12)
Neutro Abs: 6.6 10*3/uL (ref 1.7–7.7)
Neutrophils Relative %: 69 % (ref 43–77)
Platelets: 216 10*3/uL (ref 150–400)
RBC: 3.33 MIL/uL — ABNORMAL LOW (ref 4.22–5.81)
RDW: 14.2 % (ref 11.5–15.5)
WBC: 9.6 10*3/uL (ref 4.0–10.5)

## 2013-12-03 LAB — URINE MICROSCOPIC-ADD ON

## 2013-12-03 NOTE — Progress Notes (Signed)
Orthopedic Tech Progress Note Patient Details:  [name] 1932-07-27 [id number]  Ortho Devices Type of Ortho Device: Haematologist;Ace wrap Ortho Device/Splint Location: bilateral unna wraps Ortho Device/Splint Interventions: Application   Cammer, [name] 12/03/2013, 1:29 PM

## 2013-12-03 NOTE — ED Notes (Signed)
Patient transported to X-ray 

## 2013-12-03 NOTE — Discharge Instructions (Signed)
You need to see your primary care doctor for wound care management and pain medication . You have been diagnosed with cellulitis.  Chronic Pain Discharge Instructions  Emergency care providers appreciate that many patients coming to [place] are in severe pain and we wish to address their pain in the safest, most responsible manner.  It is important to recognize however, that the proper treatment of chronic pain differs from that of the pain of injuries and acute illnesses.  Our goal is to provide quality, safe, personalized care and we thank you for giving [place] the opportunity to serve you. The use of narcotics and related agents for chronic pain syndromes may lead to additional physical and psychological problems.  Nearly as many people die from prescription narcotics each year as die from car crashes.  Additionally, this risk is increased if such prescriptions are obtained from a variety of sources.  Therefore, only your primary care physician or a pain management specialist is able to safely treat such syndromes with narcotic medications long-term.    Documentation revealing such prescriptions have been sought from multiple sources may prohibit [place] from providing a refill or different narcotic medication.  Your name may be checked first through the [facility].  This database is a record of controlled substance medication prescriptions that the patient has received.  This has been established by [facility] in an effort to eliminate the dangerous, and often life threatening, practice of obtaining multiple prescriptions from different medical providers.   If you have a chronic pain syndrome (i.e. chronic headaches, recurrent back or neck pain, dental pain, abdominal or pelvis pain without a specific diagnosis, or neuropathic pain such as fibromyalgia) or recurrent visits for the same condition without an acute diagnosis, you may be treated with non-narcotics and other  non-addictive medicines.  Allergic reactions or negative side effects that may be reported by a patient to such medications will not typically lead to the use of a narcotic analgesic or other controlled substance as an alternative.   Patients managing chronic pain with a personal physician should have provisions in place for breakthrough pain.  If you are in crisis, you should call your physician.  If your physician directs you to the emergency department, please have the doctor call and speak to our attending physician concerning your care.   When patients come to the Emergency Department (ED) with acute medical conditions in which the Emergency Department physician feels appropriate to prescribe narcotic or sedating pain medication, the physician will prescribe these in very limited quantities.  The amount of these medications will last only until you can see your primary care physician in his/her office.  Any patient who returns to the ED seeking refills should expect only non-narcotic pain medications.   In the event of an acute medical condition exists and the emergency physician feels it is necessary that the patient be given a narcotic or sedating medication -  a responsible adult driver should be present in the room prior to the medication being given by the nurse.   Prescriptions for narcotic or sedating medications that have been lost, stolen or expired will not be refilled in the Emergency Department.    Patients who have chronic pain may receive non-narcotic prescriptions until seen by their primary care physician.  It is every patients personal responsibility to maintain active prescriptions with his or her primary care physician or specialist.  SEEK MEDICAL CARE IF:   You notice red streaks coming  from the infected area.  Your red area gets larger or turns dark in color.  Your bone or joint underneath the infected area becomes painful after the skin has healed.  Your infection  returns in the same area or another area.  You notice a swollen bump in the infected area.  You develop new symptoms. SEEK IMMEDIATE MEDICAL CARE IF:   You have a fever.  You feel very sleepy.  You develop vomiting or diarrhea.  You have a general ill feeling (malaise) with muscle aches and pains.

## 2013-12-03 NOTE — ED Notes (Signed)
Patient returned from X-ray 

## 2013-12-03 NOTE — ED Notes (Signed)
Pt. Stated, I've had this leg pain and fluid that runs out of my leg for a month i was discharged and its the same.

## 2013-12-03 NOTE — ED Provider Notes (Signed)
CSN: [id number]     Arrival date & time 12/03/13  1100 History   First MD Initiated Contact with Patient 12/03/13 1118     Chief Complaint  Patient presents with  . Leg Pain  . Wound Check     (Consider location/radiation/quality/duration/timing/severity/associated sxs/prior Treatment) HPI  Patient is a 80 year old male with history of diabetes, hypertension, congestive heart failure. He presents today with complaints of sores and swelling in his lower extremities. He has been seen here in for similar complaints in the past however is not improving. He denies any chest pain or shortness of breath. He denies any fevers or chills. His legs are quite uncomfortable and makes it difficult to ambulate. There are no aggravating or alleviating factors. Patient is ornery and a poor historian. He  Refuses to use TED hose. He has a home health nurse from the [place] who treats his legs. He is requesting narcotic pain meds.   Past Medical History  Diagnosis Date  . Pneumonia   . Hypertension   . Hypercholesteremia   . Diabetes mellitus   . Gout   . Irregular heart beat   . Kidney stone   . Lymphoma     s/p partial gastrectomy  . CHF (congestive heart failure)    Past Surgical History  Procedure Laterality Date  . Partial gastrectomy  1994    for lymphoma  . Hernia repair    . Joint replacement    . Back surgery    . Esophagogastroduodenoscopy N/A 10/28/2012    Procedure: ESOPHAGOGASTRODUODENOSCOPY (EGD);  Surgeon: Lear Ng, MD;  Location: [facility];  Service: Endoscopy;  Laterality: N/A;   No family history on file. History  Substance Use Topics  . Smoking status: Never Smoker   . Smokeless tobacco: Never Used  . Alcohol Use: No    Review of Systems  Constitutional: Negative for fever and chills.  HENT: Negative.   Eyes: Negative.   Respiratory: Positive for cough. Negative for shortness of breath and wheezing.   Cardiovascular: Negative for chest pain and palpitations.   Gastrointestinal: Negative for nausea, vomiting, abdominal pain, diarrhea and constipation.  Genitourinary: Negative for dysuria, urgency and frequency.  Musculoskeletal: Negative for arthralgias and myalgias.  Skin: Positive for wound. Negative for rash.  Neurological: Negative for headaches.      Allergies  Review of patient's allergies indicates no known allergies.  Home Medications   Current Outpatient Rx  Name  Route  Sig  Dispense  Refill  . albuterol (PROVENTIL HFA;VENTOLIN HFA) 108 (90 BASE) MCG/ACT inhaler   Inhalation   Inhale 2 puffs into the lungs every 6 (six) hours as needed for wheezing or shortness of breath.          . allopurinol (ZYLOPRIM) 100 MG tablet   Oral   Take 100 mg by mouth daily.         Marland Kitchen amLODipine (NORVASC) 10 MG tablet   Oral   Take 5 mg by mouth daily.         Marland Kitchen aspirin EC 81 MG tablet   Oral   Take 1 tablet (81 mg total) by mouth daily.   30 tablet   3   . bisacodyl (DULCOLAX) 5 MG EC tablet   Oral   Take 5 mg by mouth daily as needed for constipation.          . carvedilol (COREG) 12.5 MG tablet   Oral   Take 1 tablet (12.5 mg total) by mouth 2 (  two) times daily with a meal.   60 tablet   3   . cycloSPORINE (RESTASIS) 0.05 % ophthalmic emulsion   Both Eyes   Place 1 drop into both eyes 2 (two) times daily.         Marland Kitchen docusate sodium (COLACE) 100 MG capsule   Oral   Take 1 capsule (100 mg total) by mouth every 12 (twelve) hours.   30 capsule   0   . furosemide (LASIX) 40 MG tablet   Oral   Take 1 tablet (40 mg total) by mouth daily.   7 tablet   0   . gabapentin (NEURONTIN) 100 MG capsule   Oral   Take 100 mg by mouth 2 (two) times daily.         Marland Kitchen glipiZIDE (GLUCOTROL) 10 MG tablet   Oral   Take 10 mg by mouth daily.           . hydrALAZINE (APRESOLINE) 25 MG tablet   Oral   Take 50 mg by mouth 3 (three) times daily.          . hydroxypropyl methylcellulose (ISOPTO TEARS) 2.5 % ophthalmic  solution   Both Eyes   Place 1 drop into both eyes 3 (three) times daily as needed (dry eyes).          . isosorbide dinitrate (ISORDIL) 20 MG tablet   Oral   Take 20 mg by mouth 3 (three) times daily.         . niacin 500 MG tablet   Oral   Take 1,000 mg by mouth at bedtime.          Marland Kitchen oxyCODONE-acetaminophen (PERCOCET) 5-325 MG per tablet   Oral   Take 1-2 tablets by mouth every 4 (four) hours as needed.   20 tablet   0   . pantoprazole (PROTONIX) 40 MG tablet   Oral   Take 1 tablet (40 mg total) by mouth daily.   30 tablet   0   . polyethylene glycol (MIRALAX / GLYCOLAX) packet   Oral   Take 17 g by mouth daily as needed (constipation). For constipation         . potassium chloride SA (K-DUR,KLOR-CON) 20 MEQ tablet   Oral   Take 1 tablet (20 mEq total) by mouth daily.   30 tablet   6   . sertraline (ZOLOFT) 100 MG tablet   Oral   Take 200 mg by mouth at bedtime.          . torsemide (DEMADEX) 20 MG tablet   Oral   Take 40 mg by mouth daily.          . traZODone (DESYREL) 50 MG tablet   Oral   Take 50 mg by mouth at bedtime.          BP 121/49  Pulse 66  Temp(Src) 97.4 F (36.3 C)  Resp 18  Ht 6\' 3"  (1.905 m)  Wt 225 lb (102.059 kg)  BMI 28.12 kg/m2  SpO2 99% Physical Exam  Nursing note and vitals reviewed. Constitutional: He is oriented to person, place, and time. No distress.  Chronically ill appearing  HENT:  Head: Normocephalic and atraumatic.  Eyes: Conjunctivae are normal.  Neck: Normal range of motion. No JVD present.  Cardiovascular: Normal rate and regular rhythm.   Pulmonary/Chest: Effort normal.  Productive cough with ronchi  Abdominal: There is tenderness.  Multiple well healed scars. Large hernia in the LUQ. TTP  Musculoskeletal: Normal range of motion.  Neurological: He is alert and oriented to person, place, and time.  Skin:  BL venous stasis edema wnd dermatitis, with tatooing and thickened skin. There are areas  of skin break down with weeping. Left leg with large bulla, openly weeping. No signs of active infection    ED Course  Procedures (including critical care time) Labs Review Labs Reviewed  CBC WITH DIFFERENTIAL - Abnormal; Notable for the following:    RBC 3.33 (*)    Hemoglobin 10.0 (*)    HCT 29.6 (*)    All other components within normal limits  BASIC METABOLIC PANEL - Abnormal; Notable for the following:    Sodium 134 (*)    Glucose, Bld 125 (*)    BUN 43 (*)    Creatinine, Ser 1.98 (*)    GFR calc non Af Amer 30 (*)    GFR calc Af Amer 35 (*)    All other components within normal limits  URINALYSIS, ROUTINE W REFLEX MICROSCOPIC - Abnormal; Notable for the following:    Protein, ur 30 (*)    All other components within normal limits  URINE MICROSCOPIC-ADD ON   Imaging Review No results found.   EKG Interpretation None      MDM   Final diagnoses:  Stasis dermatitis of both legs    Patient with stasis dermatitis, no changes in his labs since last time. He has a home health nurse doing wound care through the [place]. Patient has no signs of infection at this time. Unna boots applied. Patient is to follow up with his PCP.  The patient appears reasonably screened and/or stabilized for discharge and I doubt any other medical condition or other [facility] requiring further screening, evaluation, or treatment in the ED at this time prior to discharge.      Margarita Mail, PA-C 12/03/13 1958

## 2013-12-03 NOTE — ED Notes (Signed)
Ortho tech paged and returned page. Notified of unna boot orders

## 2013-12-04 NOTE — ED Provider Notes (Signed)
Medical screening examination/treatment/procedure(s) were performed by non-physician practitioner and as supervising physician I was immediately available for consultation/collaboration.   EKG Interpretation None        Charles B. Karle Starch, MD 12/04/13 [phone]

## 2013-12-28 ENCOUNTER — Other Ambulatory Visit (HOSPITAL_COMMUNITY): Payer: Self-pay | Admitting: Internal Medicine

## 2014-01-21 ENCOUNTER — Other Ambulatory Visit (HOSPITAL_COMMUNITY): Payer: Self-pay | Admitting: Internal Medicine

## 2014-02-07 ENCOUNTER — Encounter (HOSPITAL_COMMUNITY): Payer: Self-pay

## 2014-02-17 ENCOUNTER — Encounter (HOSPITAL_COMMUNITY): Payer: Medicare Other

## 2014-02-20 ENCOUNTER — Telehealth (HOSPITAL_COMMUNITY): Payer: Self-pay

## 2014-02-20 ENCOUNTER — Inpatient Hospital Stay (HOSPITAL_COMMUNITY)
Admission: RE | Admit: 2014-02-20 | Discharge: 2014-02-20 | Disposition: A | Discharge status: Home / Self Care | Payer: Medicare Other | Source: Ambulatory Visit

## 2014-02-20 ENCOUNTER — Encounter (HOSPITAL_COMMUNITY): Payer: Self-pay

## 2014-02-23 ENCOUNTER — Telehealth (HOSPITAL_COMMUNITY): Payer: Self-pay | Admitting: Vascular Surgery

## 2014-02-23 NOTE — Telephone Encounter (Signed)
Shanon Brow RN case manager [facility] is needing medical records on the pt (EF) please advise

## 2014-02-27 ENCOUNTER — Encounter (HOSPITAL_COMMUNITY): Payer: Self-pay

## 2014-02-27 ENCOUNTER — Ambulatory Visit (HOSPITAL_COMMUNITY)
Admission: RE | Admit: 2014-02-27 | Discharge: 2014-02-27 | Disposition: A | Discharge status: Home / Self Care | Payer: Medicare Other | Source: Ambulatory Visit | Attending: Internal Medicine | Admitting: Internal Medicine

## 2014-02-27 VITALS — BP 137/73 | HR 81 | Resp 18 | Wt 212.1 lb

## 2014-02-27 DIAGNOSIS — I129 Hypertensive chronic kidney disease with stage 1 through stage 4 chronic kidney disease, or unspecified chronic kidney disease: Secondary | ICD-10-CM | POA: Insufficient documentation

## 2014-02-27 DIAGNOSIS — I509 Heart failure, unspecified: Secondary | ICD-10-CM

## 2014-02-27 DIAGNOSIS — N183 Chronic kidney disease, stage 3 unspecified: Secondary | ICD-10-CM

## 2014-02-27 DIAGNOSIS — I50810 Right heart failure, unspecified: Secondary | ICD-10-CM

## 2014-02-27 DIAGNOSIS — I5032 Chronic diastolic (congestive) heart failure: Secondary | ICD-10-CM | POA: Insufficient documentation

## 2014-02-27 DIAGNOSIS — I10 Essential (primary) hypertension: Secondary | ICD-10-CM

## 2014-02-27 LAB — BASIC METABOLIC PANEL
BUN: 40 mg/dL — ABNORMAL HIGH (ref 6–23)
CO2: 20 mEq/L (ref 19–32)
CREATININE: 1.94 mg/dL — AB (ref 0.50–1.35)
Calcium: 9 mg/dL (ref 8.4–10.5)
Chloride: 95 mEq/L — ABNORMAL LOW (ref 96–112)
GFR calc Af Amer: 36 mL/min — ABNORMAL LOW (ref >90)
GFR calc non Af Amer: 31 mL/min — ABNORMAL LOW (ref >90)
Glucose, Bld: 158 mg/dL — ABNORMAL HIGH (ref 70–99)
Potassium: 3.4 mEq/L — ABNORMAL LOW (ref 3.7–5.3)
Sodium: 131 mEq/L — ABNORMAL LOW (ref 137–147)

## 2014-02-27 NOTE — Patient Instructions (Signed)
Follow up in 6-8 weeks.   Paramedicine to call with medication dosages.

## 2014-02-27 NOTE — Progress Notes (Signed)
Patient ID: OTHAL KUBITZ, male   DOB: Feb 26, 1932, 80 y.o.   MRN: [id number]  Primary Physician:Dr Kilpatrick  Primary Cardiologist: Dr. Haroldine Laws  Followed at [facility]: Dr Lake Bells [phone])  HPI: Mr Bress is a 80 yo with history of HTN, CKD, DM and chronic diastolic HF (primarily R side).   RHC: 11/16/12  RA mean 19  RV 53/16  PA 56/23, mean 38  PCWP mean 21  Oxygen saturations:  PA 53%  AO 94%  Cardiac Output (Fick) 5.05  Cardiac Index (Fick) 2.36  PVR 3.36 WU   Admitted to [facility] in 10/2012 with volume overload. He required Lasix drip and Milrinone. Renal Ultrasound negative for hydronephrosis. Large bilateral renal cysts with normal kidney size. Discharge weight- 243 pounds. Creatinine 2.22   Admitted to [facility] 10/23/13 with LLE numbness. He was evaluated for possible CVA but CT and MRI was negative.  Presumed TIA.   Follow up for Heart Failure: Patient has missed appointments and just showed up to be seen in clinic today. Does not really know medications he is taking. Denies SOB, PND, orthopnea or CP. Having chronic leg pain. Walks with a cane. Does not know weight at home.  Labs 07/29/13 K 3.3 Creatinine 1.66  Labs 10/05/12: K 4.8, Creatinine 2.27, pro-BNP 1056 Labs 10/24/13 K 4.2 Creatinine 1.5  Labs 12/03/13: K 3.7 and creatinine 1.98  ECHO 09/2013: EF 55-60%, mod MR, RV sys fx nl.   SH: Lives by himself. Son and grandsons come check on every once in a while.   ROS: All systems negative except as listed in HPI, PMH and Problem List.  Past Medical History  Diagnosis Date  . Pneumonia   . Hypertension   . Hypercholesteremia   . Diabetes mellitus   . Gout   . Irregular heart beat   . Kidney stone   . Lymphoma     s/p partial gastrectomy  . CHF (congestive heart failure)     Current Outpatient Prescriptions  Medication Sig Dispense Refill  . acetaminophen (TYLENOL) 325 MG tablet Take 325 mg by mouth every 6 (six) hours as needed.      Marland Kitchen allopurinol (ZYLOPRIM) 100 MG  tablet Take 100 mg by mouth daily.      Marland Kitchen aspirin EC 81 MG tablet Take 1 tablet (81 mg total) by mouth daily.  30 tablet  3  . bisacodyl (DULCOLAX) 5 MG EC tablet Take 5 mg by mouth daily as needed for constipation.       . carboxymethylcellulose 1 % ophthalmic solution Apply 1 drop to eye 4 (four) times daily.      . carvedilol (COREG) 12.5 MG tablet Take 1 tablet (12.5 mg total) by mouth 2 (two) times daily with a meal.  60 tablet  3  . cyclobenzaprine (FLEXERIL) 10 MG tablet Take 10 mg by mouth 3 (three) times daily as needed for muscle spasms.      . ferrous sulfate 325 (65 FE) MG tablet Take 325 mg by mouth 2 (two) times daily with a meal.      . gabapentin (NEURONTIN) 100 MG capsule Take 100 mg by mouth 2 (two) times daily.      Marland Kitchen glipiZIDE (GLUCOTROL) 10 MG tablet Take 5 mg by mouth daily.       . hydrALAZINE (APRESOLINE) 50 MG tablet Take 50 mg by mouth 3 (three) times daily.      Marland Kitchen HYDROcodone-acetaminophen (NORCO/VICODIN) 5-325 MG per tablet Take 1-2 tablets by mouth every 4 (four)  hours as needed for moderate pain.      . isosorbide dinitrate (ISORDIL) 20 MG tablet Take 20 mg by mouth 3 (three) times daily.      . metolazone (ZAROXOLYN) 2.5 MG tablet Take 2.5 mg by mouth every other day.      . mirtazapine (REMERON) 15 MG tablet Take 7.5 mg by mouth at bedtime.      . niacin 500 MG tablet Take 1,000 mg by mouth at bedtime.       Marland Kitchen omeprazole (PRILOSEC) 20 MG capsule Take 20 mg by mouth 2 (two) times daily before a meal.      . psyllium (METAMUCIL) 58.6 % powder Take 1 packet by mouth 3 (three) times daily.      Marland Kitchen senna-docusate (SENOKOT-S) 8.6-50 MG per tablet Take 2 tablets by mouth 2 (two) times daily as needed for mild constipation.      . sertraline (ZOLOFT) 100 MG tablet Take 200 mg by mouth at bedtime.       . simvastatin (ZOCOR) 20 MG tablet TAKE 1/2 TABLET BY MOUTH AT BEDTIME  30 tablet  3  . torsemide (DEMADEX) 20 MG tablet Take 20 mg by mouth 2 (two) times daily.      .  traZODone (DESYREL) 50 MG tablet Take 50 mg by mouth at bedtime.       No current facility-administered medications for this encounter.   Filed Vitals:   02/27/14 1214  BP: 137/73  Pulse: 81  Resp: 18  Weight: 212 lb 2 oz (96.219 kg)  SpO2: 96%    PHYSICAL EXAM  General: Elderly. NAD; in wheelchair Neck: JVP 7 no thyromegaly or thyroid nodule.  Lungs: CTA CV: Nondisplaced PMI. Heart regular S1/S2, increased P2, no F7/T0, 2/6 systolic murmur LLSB. No carotid bruit.  Abdomen: Nontender. Large scar. +large ventral hernia Neurologic: Alert and oriented x 3. Mild dysarthria Psych: Normal affect.  Extremities: No clubbing or cyanosis. RLE and LLE 1-2+  Edema, compression stockings intact.     ASSESSMENT & PLAN:  1) Chronic diastolic CHF: Primarily right side, EF 55-60% (09/2013) - NYHA II symptoms and volume status stable. Very difficult situation patient showed up to clinic today thinking he had an appointment which he didn't and seems to have isseus with his memory. He is not very sure of the medications he is taking. His list from the [place] does not match out medication list.  - Have made a call in to [place] with Paramedicine program to confirm medications and dosages. If he is taking torsemide 20 mg BID then will continue. Need to clarify dose of metolazone whether he is taking it QOD if he is he may not need that much. Check BMET today. - [facility] get [facility]. - SBP stable.  - Patient likely needs to be in nursing home concerned that he has some cognitive deficits and can't remember to take medications as prescribed. He is not interested currently though in SNF.  - Reinforced the need and importance of daily weights, a low sodium diet, and fluid restriction (less than 2 L a day). Instructed to call the HF clinic if weight increases more than 3 lbs overnight or 5 lbs in a week.  2) HTN:  - Stable. As above will wait to get dosages of medications.  3) CKD stage III - baseline Cr  1.6-2.0. Will check today. He reports he follows up with a renal MD but does not know their name.   Follow  up 6-8 weeks.  [name] B NP-C 2:01 PM

## 2014-02-28 ENCOUNTER — Telehealth (HOSPITAL_COMMUNITY): Payer: Self-pay

## 2014-02-28 NOTE — Telephone Encounter (Signed)
Labs reviewed with patient, instructed to take extra 40 meq potassium today.  Message left for paramedic to follow up with patient. Renee Pain

## 2014-03-06 ENCOUNTER — Encounter (HOSPITAL_COMMUNITY): Payer: Medicare Other

## 2014-03-07 ENCOUNTER — Encounter (HOSPITAL_COMMUNITY): Payer: Self-pay

## 2014-03-07 NOTE — Progress Notes (Signed)
[name] with paramedicine stopped in to clinic to review patient's medications.  States she went for a routine visit and found that he was missing some medications that were ordered by our [facility]inic to take.  Per patient, the [place] doctor stopped his metolazone and torsemide, unclear why.

## 2014-03-11 ENCOUNTER — Encounter (HOSPITAL_COMMUNITY): Payer: Self-pay | Admitting: Emergency Medicine

## 2014-03-11 ENCOUNTER — Encounter (HOSPITAL_COMMUNITY)
Admission: EM | Disposition: A | Discharge status: Home Health | Payer: Self-pay | Source: Home / Self Care | Attending: Internal Medicine

## 2014-03-11 ENCOUNTER — Emergency Department (HOSPITAL_COMMUNITY): Payer: Medicare Other

## 2014-03-11 ENCOUNTER — Inpatient Hospital Stay (HOSPITAL_COMMUNITY)
Admission: EM | Admit: 2014-03-11 | Discharge: 2014-03-16 | DRG: 177 | Disposition: A | Discharge status: Home Health | Payer: Medicare Other | Attending: Internal Medicine | Admitting: Internal Medicine

## 2014-03-11 DIAGNOSIS — Z8673 Personal history of transient ischemic attack (TIA), and cerebral infarction without residual deficits: Secondary | ICD-10-CM

## 2014-03-11 DIAGNOSIS — K224 Dyskinesia of esophagus: Secondary | ICD-10-CM | POA: Diagnosis present

## 2014-03-11 DIAGNOSIS — R1310 Dysphagia, unspecified: Secondary | ICD-10-CM

## 2014-03-11 DIAGNOSIS — Z7982 Long term (current) use of aspirin: Secondary | ICD-10-CM | POA: Diagnosis not present

## 2014-03-11 DIAGNOSIS — E119 Type 2 diabetes mellitus without complications: Secondary | ICD-10-CM | POA: Diagnosis present

## 2014-03-11 DIAGNOSIS — Z6825 Body mass index (BMI) 25.0-25.9, adult: Secondary | ICD-10-CM | POA: Diagnosis not present

## 2014-03-11 DIAGNOSIS — J690 Pneumonitis due to inhalation of food and vomit: Secondary | ICD-10-CM | POA: Diagnosis present

## 2014-03-11 DIAGNOSIS — K222 Esophageal obstruction: Secondary | ICD-10-CM | POA: Diagnosis present

## 2014-03-11 DIAGNOSIS — E1122 Type 2 diabetes mellitus with diabetic chronic kidney disease: Secondary | ICD-10-CM

## 2014-03-11 DIAGNOSIS — I509 Heart failure, unspecified: Secondary | ICD-10-CM

## 2014-03-11 DIAGNOSIS — R202 Paresthesia of skin: Secondary | ICD-10-CM

## 2014-03-11 DIAGNOSIS — J9600 Acute respiratory failure, unspecified whether with hypoxia or hypercapnia: Secondary | ICD-10-CM | POA: Diagnosis present

## 2014-03-11 DIAGNOSIS — D500 Iron deficiency anemia secondary to blood loss (chronic): Secondary | ICD-10-CM | POA: Diagnosis present

## 2014-03-11 DIAGNOSIS — I5030 Unspecified diastolic (congestive) heart failure: Secondary | ICD-10-CM

## 2014-03-11 DIAGNOSIS — I498 Other specified cardiac arrhythmias: Secondary | ICD-10-CM | POA: Diagnosis present

## 2014-03-11 DIAGNOSIS — E876 Hypokalemia: Secondary | ICD-10-CM | POA: Diagnosis present

## 2014-03-11 DIAGNOSIS — N183 Chronic kidney disease, stage 3 unspecified: Secondary | ICD-10-CM

## 2014-03-11 DIAGNOSIS — I079 Rheumatic tricuspid valve disease, unspecified: Secondary | ICD-10-CM | POA: Diagnosis present

## 2014-03-11 DIAGNOSIS — I10 Essential (primary) hypertension: Secondary | ICD-10-CM

## 2014-03-11 DIAGNOSIS — M1990 Unspecified osteoarthritis, unspecified site: Secondary | ICD-10-CM

## 2014-03-11 DIAGNOSIS — M549 Dorsalgia, unspecified: Secondary | ICD-10-CM

## 2014-03-11 DIAGNOSIS — K220 Achalasia of cardia: Secondary | ICD-10-CM

## 2014-03-11 DIAGNOSIS — I483 Typical atrial flutter: Secondary | ICD-10-CM

## 2014-03-11 DIAGNOSIS — N200 Calculus of kidney: Secondary | ICD-10-CM

## 2014-03-11 DIAGNOSIS — D62 Acute posthemorrhagic anemia: Secondary | ICD-10-CM | POA: Diagnosis present

## 2014-03-11 DIAGNOSIS — Z79899 Other long term (current) drug therapy: Secondary | ICD-10-CM

## 2014-03-11 DIAGNOSIS — N189 Chronic kidney disease, unspecified: Secondary | ICD-10-CM

## 2014-03-11 DIAGNOSIS — I1310 Hypertensive heart and chronic kidney disease without heart failure, with stage 1 through stage 4 chronic kidney disease, or unspecified chronic kidney disease: Secondary | ICD-10-CM

## 2014-03-11 DIAGNOSIS — I5033 Acute on chronic diastolic (congestive) heart failure: Secondary | ICD-10-CM

## 2014-03-11 DIAGNOSIS — C8589 Other specified types of non-Hodgkin lymphoma, extranodal and solid organ sites: Secondary | ICD-10-CM | POA: Diagnosis present

## 2014-03-11 DIAGNOSIS — E440 Moderate protein-calorie malnutrition: Secondary | ICD-10-CM | POA: Diagnosis present

## 2014-03-11 DIAGNOSIS — Z903 Acquired absence of stomach [part of]: Secondary | ICD-10-CM | POA: Diagnosis not present

## 2014-03-11 DIAGNOSIS — I129 Hypertensive chronic kidney disease with stage 1 through stage 4 chronic kidney disease, or unspecified chronic kidney disease: Secondary | ICD-10-CM | POA: Diagnosis present

## 2014-03-11 DIAGNOSIS — J189 Pneumonia, unspecified organism: Secondary | ICD-10-CM

## 2014-03-11 DIAGNOSIS — T18108A Unspecified foreign body in esophagus causing other injury, initial encounter: Secondary | ICD-10-CM

## 2014-03-11 DIAGNOSIS — N179 Acute kidney failure, unspecified: Secondary | ICD-10-CM

## 2014-03-11 DIAGNOSIS — E785 Hyperlipidemia, unspecified: Secondary | ICD-10-CM | POA: Diagnosis present

## 2014-03-11 DIAGNOSIS — I4892 Unspecified atrial flutter: Secondary | ICD-10-CM | POA: Diagnosis present

## 2014-03-11 DIAGNOSIS — Z87442 Personal history of urinary calculi: Secondary | ICD-10-CM

## 2014-03-11 DIAGNOSIS — G8929 Other chronic pain: Secondary | ICD-10-CM

## 2014-03-11 DIAGNOSIS — I5082 Biventricular heart failure: Secondary | ICD-10-CM

## 2014-03-11 DIAGNOSIS — I440 Atrioventricular block, first degree: Secondary | ICD-10-CM

## 2014-03-11 DIAGNOSIS — J9601 Acute respiratory failure with hypoxia: Secondary | ICD-10-CM

## 2014-03-11 DIAGNOSIS — R55 Syncope and collapse: Secondary | ICD-10-CM

## 2014-03-11 DIAGNOSIS — I5032 Chronic diastolic (congestive) heart failure: Secondary | ICD-10-CM | POA: Diagnosis present

## 2014-03-11 DIAGNOSIS — D649 Anemia, unspecified: Secondary | ICD-10-CM | POA: Diagnosis present

## 2014-03-11 DIAGNOSIS — I50810 Right heart failure, unspecified: Secondary | ICD-10-CM

## 2014-03-11 HISTORY — PX: ESOPHAGOGASTRODUODENOSCOPY: SHX5428

## 2014-03-11 LAB — BASIC METABOLIC PANEL
Anion gap: 15 (ref 5–15)
BUN: 46 mg/dL — AB (ref 6–23)
CALCIUM: 9.2 mg/dL (ref 8.4–10.5)
CO2: 22 meq/L (ref 19–32)
CREATININE: 2.15 mg/dL — AB (ref 0.50–1.35)
Chloride: 100 mEq/L (ref 96–112)
GFR calc Af Amer: 32 mL/min — ABNORMAL LOW (ref >90)
GFR calc non Af Amer: 27 mL/min — ABNORMAL LOW (ref >90)
GLUCOSE: 145 mg/dL — AB (ref 70–99)
Potassium: 3.6 mEq/L — ABNORMAL LOW (ref 3.7–5.3)
Sodium: 137 mEq/L (ref 137–147)

## 2014-03-11 LAB — CBC WITH DIFFERENTIAL/PLATELET
Basophils Absolute: 0 10*3/uL (ref 0.0–0.1)
Basophils Relative: 0 % (ref 0–1)
EOS PCT: 2 % (ref 0–5)
Eosinophils Absolute: 0.1 10*3/uL (ref 0.0–0.7)
HCT: 32.9 % — ABNORMAL LOW (ref 39.0–52.0)
Hemoglobin: 11.2 g/dL — ABNORMAL LOW (ref 13.0–17.0)
LYMPHS ABS: 1.4 10*3/uL (ref 0.7–4.0)
LYMPHS PCT: 18 % (ref 12–46)
MCH: 30.4 pg (ref 26.0–34.0)
MCHC: 34 g/dL (ref 30.0–36.0)
MCV: 89.4 fL (ref 78.0–100.0)
MONOS PCT: 11 % (ref 3–12)
Monocytes Absolute: 0.8 10*3/uL (ref 0.1–1.0)
Neutro Abs: 5.5 10*3/uL (ref 1.7–7.7)
Neutrophils Relative %: 69 % (ref 43–77)
Platelets: 316 10*3/uL (ref 150–400)
RBC: 3.68 MIL/uL — AB (ref 4.22–5.81)
RDW: 15.2 % (ref 11.5–15.5)
WBC: 7.8 10*3/uL (ref 4.0–10.5)

## 2014-03-11 LAB — EXPECTORATED SPUTUM ASSESSMENT W REFEX TO RESP CULTURE

## 2014-03-11 LAB — GLUCOSE, CAPILLARY: Glucose-Capillary: 156 mg/dL — ABNORMAL HIGH (ref 70–99)

## 2014-03-11 LAB — HIV ANTIBODY (ROUTINE TESTING W REFLEX): HIV: NONREACTIVE

## 2014-03-11 LAB — EXPECTORATED SPUTUM ASSESSMENT W GRAM STAIN, RFLX TO RESP C

## 2014-03-11 SURGERY — EGD (ESOPHAGOGASTRODUODENOSCOPY)
Anesthesia: Moderate Sedation | Laterality: Left

## 2014-03-11 MED ORDER — SUCRALFATE 1 GM/10ML PO SUSP
1.0000 g | Freq: Three times a day (TID) | ORAL | Status: DC
  Administered 2014-03-11 – 2014-03-16 (×18): 1 g via ORAL
  Filled 2014-03-11 (×27): qty 10

## 2014-03-11 MED ORDER — SERTRALINE HCL 100 MG PO TABS
200.0000 mg | ORAL_TABLET | Freq: Every day | ORAL | Status: DC
  Administered 2014-03-11 – 2014-03-15 (×5): 200 mg via ORAL
  Filled 2014-03-11 (×6): qty 2

## 2014-03-11 MED ORDER — PANTOPRAZOLE SODIUM 40 MG PO TBEC
40.0000 mg | DELAYED_RELEASE_TABLET | Freq: Every day | ORAL | Status: DC
  Administered 2014-03-11 – 2014-03-16 (×6): 40 mg via ORAL
  Filled 2014-03-11 (×7): qty 1

## 2014-03-11 MED ORDER — FERROUS SULFATE 325 (65 FE) MG PO TABS
325.0000 mg | ORAL_TABLET | Freq: Two times a day (BID) | ORAL | Status: DC
  Administered 2014-03-11 – 2014-03-16 (×10): 325 mg via ORAL
  Filled 2014-03-11 (×12): qty 1

## 2014-03-11 MED ORDER — MIDAZOLAM HCL 10 MG/2ML IJ SOLN
INTRAMUSCULAR | Status: DC | PRN
  Administered 2014-03-11 (×2): 2 mg via INTRAVENOUS

## 2014-03-11 MED ORDER — ASPIRIN EC 81 MG PO TBEC
81.0000 mg | DELAYED_RELEASE_TABLET | Freq: Every day | ORAL | Status: DC
  Administered 2014-03-11 – 2014-03-16 (×6): 81 mg via ORAL
  Filled 2014-03-11 (×6): qty 1

## 2014-03-11 MED ORDER — TRAZODONE HCL 50 MG PO TABS
50.0000 mg | ORAL_TABLET | Freq: Every day | ORAL | Status: DC
  Administered 2014-03-11 – 2014-03-15 (×5): 50 mg via ORAL
  Filled 2014-03-11 (×7): qty 1

## 2014-03-11 MED ORDER — ONDANSETRON HCL 4 MG/2ML IJ SOLN: 4.0000 mg | Freq: Four times a day (QID) | INTRAMUSCULAR | Status: DC | PRN

## 2014-03-11 MED ORDER — ALLOPURINOL 100 MG PO TABS
100.0000 mg | ORAL_TABLET | Freq: Every day | ORAL | Status: DC
  Administered 2014-03-11 – 2014-03-16 (×6): 100 mg via ORAL
  Filled 2014-03-11 (×6): qty 1

## 2014-03-11 MED ORDER — SENNOSIDES-DOCUSATE SODIUM 8.6-50 MG PO TABS: 2.0000 | ORAL_TABLET | Freq: Two times a day (BID) | ORAL | Status: DC | PRN

## 2014-03-11 MED ORDER — SODIUM CHLORIDE 0.9 % IV SOLN
INTRAVENOUS | Status: AC
  Administered 2014-03-11: 16:00:00 via INTRAVENOUS

## 2014-03-11 MED ORDER — ISOSORBIDE DINITRATE 20 MG PO TABS
20.0000 mg | ORAL_TABLET | Freq: Three times a day (TID) | ORAL | Status: DC
  Administered 2014-03-11 – 2014-03-14 (×7): 20 mg via ORAL
  Filled 2014-03-11 (×11): qty 1

## 2014-03-11 MED ORDER — FENTANYL CITRATE 0.05 MG/ML IJ SOLN
INTRAMUSCULAR | Status: AC
  Filled 2014-03-11: qty 2

## 2014-03-11 MED ORDER — SIMVASTATIN 10 MG PO TABS
10.0000 mg | ORAL_TABLET | Freq: Every day | ORAL | Status: DC
  Administered 2014-03-11 – 2014-03-16 (×6): 10 mg via ORAL
  Filled 2014-03-11 (×6): qty 1

## 2014-03-11 MED ORDER — DEXTROSE 5 % IV SOLN
500.0000 mg | INTRAVENOUS | Status: DC
  Administered 2014-03-12: 500 mg via INTRAVENOUS
  Filled 2014-03-11: qty 500

## 2014-03-11 MED ORDER — ALBUTEROL SULFATE (2.5 MG/3ML) 0.083% IN NEBU: 2.5000 mg | INHALATION_SOLUTION | RESPIRATORY_TRACT | Status: DC | PRN

## 2014-03-11 MED ORDER — FENTANYL CITRATE 0.05 MG/ML IJ SOLN
INTRAMUSCULAR | Status: DC | PRN
  Administered 2014-03-11 (×2): 25 ug via INTRAVENOUS

## 2014-03-11 MED ORDER — HYDROMORPHONE HCL PF 1 MG/ML IJ SOLN
0.5000 mg | INTRAMUSCULAR | Status: DC | PRN
  Administered 2014-03-11 – 2014-03-15 (×17): 0.5 mg via INTRAVENOUS
  Filled 2014-03-11 (×17): qty 1

## 2014-03-11 MED ORDER — HYDROCODONE-ACETAMINOPHEN 5-325 MG PO TABS
1.0000 | ORAL_TABLET | ORAL | Status: DC | PRN
  Administered 2014-03-13: 1 via ORAL
  Administered 2014-03-15 (×2): 2 via ORAL
  Filled 2014-03-11: qty 2
  Filled 2014-03-11: qty 1
  Filled 2014-03-11 (×2): qty 2

## 2014-03-11 MED ORDER — HYDROCODONE-ACETAMINOPHEN 5-325 MG PO TABS: 2.0000 | ORAL_TABLET | Freq: Once | ORAL | Status: DC

## 2014-03-11 MED ORDER — CEFTRIAXONE SODIUM 1 G IJ SOLR
1.0000 g | INTRAMUSCULAR | Status: DC
  Administered 2014-03-12: 1 g via INTRAVENOUS
  Filled 2014-03-11: qty 10

## 2014-03-11 MED ORDER — BUTAMBEN-TETRACAINE-BENZOCAINE 2-2-14 % EX AERO
INHALATION_SPRAY | CUTANEOUS | Status: DC | PRN
  Administered 2014-03-11: 2 via TOPICAL

## 2014-03-11 MED ORDER — DIPHENHYDRAMINE HCL 50 MG/ML IJ SOLN
INTRAMUSCULAR | Status: AC
  Filled 2014-03-11: qty 1

## 2014-03-11 MED ORDER — DEXTROSE 5 % IV SOLN
1.0000 g | Freq: Once | INTRAVENOUS | Status: AC
  Administered 2014-03-11: 1 g via INTRAVENOUS
  Filled 2014-03-11: qty 10

## 2014-03-11 MED ORDER — NIACIN 500 MG PO TABS
1000.0000 mg | ORAL_TABLET | Freq: Every day | ORAL | Status: DC
  Administered 2014-03-11 – 2014-03-15 (×4): 1000 mg via ORAL
  Filled 2014-03-11 (×5): qty 2

## 2014-03-11 MED ORDER — SODIUM CHLORIDE 0.9 % IV SOLN
INTRAVENOUS | Status: DC
  Administered 2014-03-11: 500 mL via INTRAVENOUS

## 2014-03-11 MED ORDER — HYDRALAZINE HCL 50 MG PO TABS
50.0000 mg | ORAL_TABLET | Freq: Three times a day (TID) | ORAL | Status: DC
  Administered 2014-03-11 – 2014-03-14 (×7): 50 mg via ORAL
  Filled 2014-03-11 (×11): qty 1

## 2014-03-11 MED ORDER — CARVEDILOL 12.5 MG PO TABS
12.5000 mg | ORAL_TABLET | Freq: Two times a day (BID) | ORAL | Status: DC
  Administered 2014-03-11 – 2014-03-15 (×8): 12.5 mg via ORAL
  Filled 2014-03-11 (×10): qty 1

## 2014-03-11 MED ORDER — GLUCAGON HCL RDNA (DIAGNOSTIC) 1 MG IJ SOLR
1.0000 mg | Freq: Once | INTRAMUSCULAR | Status: AC
  Administered 2014-03-11: 1 mg via INTRAVENOUS
  Filled 2014-03-11: qty 1

## 2014-03-11 MED ORDER — MIDAZOLAM HCL 10 MG/2ML IJ SOLN
INTRAMUSCULAR | Status: AC
  Filled 2014-03-11: qty 2

## 2014-03-11 MED ORDER — DEXTROSE 5 % IV SOLN
500.0000 mg | Freq: Once | INTRAVENOUS | Status: AC
  Administered 2014-03-11: 500 mg via INTRAVENOUS
  Filled 2014-03-11: qty 500

## 2014-03-11 MED ORDER — METOLAZONE 2.5 MG PO TABS
2.5000 mg | ORAL_TABLET | ORAL | Status: DC
  Administered 2014-03-12 – 2014-03-14 (×2): 2.5 mg via ORAL
  Filled 2014-03-11 (×2): qty 1

## 2014-03-11 MED ORDER — MIRTAZAPINE 7.5 MG PO TABS
7.5000 mg | ORAL_TABLET | Freq: Every day | ORAL | Status: DC
  Administered 2014-03-11 – 2014-03-15 (×5): 7.5 mg via ORAL
  Filled 2014-03-11 (×6): qty 1

## 2014-03-11 MED ORDER — ENOXAPARIN SODIUM 40 MG/0.4ML ~~LOC~~ SOLN
40.0000 mg | SUBCUTANEOUS | Status: DC
  Administered 2014-03-11 – 2014-03-12 (×2): 40 mg via SUBCUTANEOUS
  Filled 2014-03-11 (×3): qty 0.4

## 2014-03-11 NOTE — ED Notes (Signed)
He is taken to Endoscopy by their staff at this time without incident.

## 2014-03-11 NOTE — Progress Notes (Signed)
Received call from lab that sputum specimen has too much saliva in it to run. Will replace order and encourage not saliva filled sample from patient. Pt has difficulty swallowing (per SLP) so it is difficult to receive speciment without saliva. Alerted lab to this and they said that we would have to keep trying until we got one that would work for their protocols. Will continue to gain appropriate lab specimen.

## 2014-03-11 NOTE — ED Provider Notes (Signed)
CSN: [id number]     Arrival date & time 03/11/14  0240 History   First MD Initiated Contact with Patient 03/11/14 0319     Chief Complaint  Patient presents with  . Dysphagia     (Consider location/radiation/quality/duration/timing/severity/associated sxs/prior Treatment) HPI Comments: The patient is an 80 year old male with a history of esophageal stricture and a prior food impaction most recently dilated to September of 2012. He presents with a complaint of esophageal foreign body. He states that while swallowing his medicine earlier in the day, 3 pills at a time they became lodged and he has had a difficult time with swallowing since that time. He has been able to tolerate liquids but states that he has significant discomfort in his upper chest and neck with swallowing. He presents asking for pain medication. This gets worse with swallowing, not associated with vomiting or diarrhea, no fevers no shortness of breath and he denies any changes in his voice to me. This this occurred approximately 12 hours prior to arrival.  GI - Penelope Coop  The history is provided by the patient and medical records.    Past Medical History  Diagnosis Date  . Pneumonia   . Hypertension   . Hypercholesteremia   . Diabetes mellitus   . Gout   . Irregular heart beat   . Kidney stone   . Lymphoma     s/p partial gastrectomy  . CHF (congestive heart failure)    Past Surgical History  Procedure Laterality Date  . Partial gastrectomy  1994    for lymphoma  . Hernia repair    . Joint replacement    . Back surgery    . Esophagogastroduodenoscopy N/A 10/28/2012    Procedure: ESOPHAGOGASTRODUODENOSCOPY (EGD);  Surgeon: Lear Ng, MD;  Location: [facility];  Service: Endoscopy;  Laterality: N/A;   History reviewed. No pertinent family history. History  Substance Use Topics  . Smoking status: Never Smoker   . Smokeless tobacco: Never Used  . Alcohol Use: No    Review of Systems  All other  systems reviewed and are negative.     Allergies  Review of patient's allergies indicates no known allergies.  Home Medications   Prior to Admission medications   Medication Sig Start Date End Date Taking? Authorizing Provider  allopurinol (ZYLOPRIM) 100 MG tablet Take 100 mg by mouth daily.   Yes Historical Provider, MD  aspirin EC 81 MG tablet Take 1 tablet (81 mg total) by mouth daily. 10/19/13  Yes Jolaine Artist, MD  carvedilol (COREG) 12.5 MG tablet Take 1 tablet (12.5 mg total) by mouth 2 (two) times daily with a meal. 10/19/13  Yes Jolaine Artist, MD  ferrous sulfate 325 (65 FE) MG tablet Take 325 mg by mouth 2 (two) times daily with a meal.   Yes Historical Provider, MD  gabapentin (NEURONTIN) 100 MG capsule Take 100 mg by mouth 2 (two) times daily.   Yes Historical Provider, MD  glipiZIDE (GLUCOTROL) 10 MG tablet Take 5 mg by mouth daily.    Yes Historical Provider, MD  hydrALAZINE (APRESOLINE) 50 MG tablet Take 50 mg by mouth 3 (three) times daily.   Yes Historical Provider, MD  HYDROcodone-acetaminophen (NORCO/VICODIN) 5-325 MG per tablet Take 1-2 tablets by mouth every 4 (four) hours as needed for moderate pain.   Yes Historical Provider, MD  isosorbide dinitrate (ISORDIL) 20 MG tablet Take 20 mg by mouth 3 (three) times daily.   Yes Historical Provider, MD  metolazone (ZAROXOLYN)  2.5 MG tablet Take 2.5 mg by mouth every other day.   Yes Historical Provider, MD  mirtazapine (REMERON) 15 MG tablet Take 7.5 mg by mouth at bedtime.   Yes Historical Provider, MD  niacin 500 MG tablet Take 1,000 mg by mouth at bedtime.    Yes Historical Provider, MD  omeprazole (PRILOSEC) 20 MG capsule Take 20 mg by mouth 2 (two) times daily before a meal.   Yes Historical Provider, MD  psyllium (METAMUCIL) 58.6 % powder Take 1 packet by mouth 3 (three) times daily.   Yes Historical Provider, MD  senna-docusate (SENOKOT-S) 8.6-50 MG per tablet Take 2 tablets by mouth 2 (two) times daily as  needed for mild constipation.   Yes Historical Provider, MD  sertraline (ZOLOFT) 100 MG tablet Take 200 mg by mouth at bedtime.    Yes Historical Provider, MD  simvastatin (ZOCOR) 20 MG tablet Take 10 mg by mouth daily.   Yes Historical Provider, MD  torsemide (DEMADEX) 20 MG tablet Take 20 mg by mouth 2 (two) times daily.   Yes Historical Provider, MD  traZODone (DESYREL) 50 MG tablet Take 50 mg by mouth at bedtime.   Yes Historical Provider, MD  carboxymethylcellulose 1 % ophthalmic solution Apply 1 drop to eye 4 (four) times daily.    Historical Provider, MD   BP 142/68  Pulse 60  Temp(Src) 98.7 F (37.1 C) (Oral)  Resp 22  SpO2 98% Physical Exam  Nursing note and vitals reviewed. Constitutional: He appears well-developed and well-nourished. No distress.  HENT:  Head: Normocephalic and atraumatic.  Mouth/Throat: Oropharynx is clear and moist. No oropharyngeal exudate.  No obvious oropharyngeal foreign bodies, no trismus or torticollis, mucous membranes moist, no phlegm, vomitus or blood in oral cavity  Eyes: Conjunctivae and EOM are normal. Pupils are equal, round, and reactive to light. Right eye exhibits no discharge. Left eye exhibits no discharge. No scleral icterus.  Neck: Normal range of motion. Neck supple. No JVD present. No thyromegaly present.  Cardiovascular: Normal rate, regular rhythm, normal heart sounds and intact distal pulses.  Exam reveals no gallop and no friction rub.   No murmur heard. Pulmonary/Chest: Effort normal and breath sounds normal. No respiratory distress. He has no wheezes. He has no rales.  Abdominal: Soft. Bowel sounds are normal. He exhibits no distension and no mass. There is no tenderness.  Musculoskeletal: Normal range of motion. He exhibits no edema and no tenderness.  Lymphadenopathy:    He has no cervical adenopathy.  Neurological: He is alert. Coordination normal.  Skin: Skin is warm and dry. No rash noted. No erythema.  Psychiatric: He has  a normal mood and affect. His behavior is normal.    ED Course  Procedures (including critical care time) Labs Review Labs Reviewed  CBC WITH DIFFERENTIAL - Abnormal; Notable for the following:    RBC 3.68 (*)    Hemoglobin 11.2 (*)    HCT 32.9 (*)    All other components within normal limits  BASIC METABOLIC PANEL - Abnormal; Notable for the following:    Potassium 3.6 (*)    Glucose, Bld 145 (*)    BUN 46 (*)    Creatinine, Ser 2.15 (*)    GFR calc non Af Amer 27 (*)    GFR calc Af Amer 32 (*)    All other components within normal limits    Imaging Review Dg Neck Soft Tissue  03/11/2014   CLINICAL DATA:  Dysphagia.  EXAM: NECK SOFT TISSUES -  1+ VIEW  COMPARISON:  CT of the cervical spine September 25, 2013  FINDINGS: There is no evidence of retropharyngeal soft tissue swelling or epiglottic enlargement. The cervical airway is unremarkable and no radio-opaque foreign body identified.  Multiple linear radiopaque foreign bodies about the oral cavity consistent with dental hardware, recommend direct inspection. Mild lateral bilateral soft tissue calcifications may be vascular. Bridging syndesmophytes most consistent with ankylosing spondylitis. Bilateral humeral arthroplasties.  IMPRESSION: No radiopaque foreign bodies along the included aerodigestive tract.   Electronically Signed   By: Elon Alas   On: 03/11/2014 04:34   Dg Chest 2 View  03/11/2014   CLINICAL DATA:  Patient swallowed a pill and felt like it got stuck.  EXAM: CHEST  2 VIEW  COMPARISON:  12/03/2013  FINDINGS: Cardiac enlargement without vascular congestion. Airspace disease in the right lung days suggesting developing infiltration or pneumonia. The no pneumothorax. No blunting of costophrenic angles. Degenerative changes in the spine and left shoulder. Loss of subacromial space consistent with chronic rotator cuff arthropathy on the left. Postoperative changes with right reverse shoulder arthroplasty.  IMPRESSION:  Developing airspace disease in the right lung base suggesting pneumonia.   Electronically Signed   By: Lucienne Capers M.D.   On: 03/11/2014 04:31      MDM   Final diagnoses:  Aspiration pneumonia, unspecified aspiration pneumonia type  Impacted esophageal foreign body, initial encounter    The patient's exam is consistent with a possible esophageal foreign body however he does not appear to be obstructed, is tolerating liquids, tolerating secretions. We'll give pain medication, chest x-rays including chest and neck  D/W Dr. Penelope Coop who will see pt in consultation this AM - will admit to hospitalist service for aspiration pna.  Labs stable.  VS improved - HR improved, pt refuses to swallow 2/2 pain and vomiting.  D/w Dr. Alcario Drought who agrees with admission.  Meds given in ED:  Medications  HYDROcodone-acetaminophen (NORCO/VICODIN) 5-325 MG per tablet 2 tablet (not administered)  azithromycin (ZITHROMAX) 500 mg in dextrose 5 % 250 mL IVPB (500 mg Intravenous New Bag/Given 03/11/14 0603)  glucagon (human recombinant) (GLUCAGEN) injection 1 mg (1 mg Intravenous Given 03/11/14 0426)  cefTRIAXone (ROCEPHIN) 1 g in dextrose 5 % 50 mL IVPB (0 g Intravenous Stopped 03/11/14 0603)    New Prescriptions   No medications on file      Johnna Acosta, MD 03/11/14 [phone]

## 2014-03-11 NOTE — Progress Notes (Signed)
Pt keeps calling out to have leg dressings changed. There are no orders to change his dressings. When patient is reoriented to having had dressings recently changed he understands, but forgets about 30 minutes later and has to be reminded again and again. Will continue to monitor.

## 2014-03-11 NOTE — ED Notes (Signed)
He arouses easily from comfortable sleep; and tells me he feels "alright; my throat still hurts a little".  He denies any problem with air exchange; and his skin is normal, warm and dry.  We are in the process of obtaining for him a tele. Bed.  Monitor shows what appears to be atrial tach. With variable block; with his overall heart rate at ~75/min.  He tells he he is aware of plan to admit, with which he agrees.  He falls to sleep soon after my assessment, with his breathing being normal.

## 2014-03-11 NOTE — ED Notes (Signed)
I have just given phone report to Gregary Signs, RN on [facility]--will transport shortly.  He remains in no distress.

## 2014-03-11 NOTE — Consult Note (Signed)
[facility] Gastroenterology Consultation Note  Referring Provider: Dr. Mart Piggs [facility]) Primary Care Physician:  Leola Brazil, MD Primary Gastroenterologist:  Dr. Clarene Essex  Reason for Consultation:  Foreign body sensation, odynophagia  HPI: [name] is a 80 y.o. male with history of esophageal stricture and food impaction.  Presents to ED with sensation of pills being stuck in his throat since last night.  Doesn't recall which pills.  Can't tolerate liquids due to discomfort.  Neck and chest films unrevealing.  Patient has no abdominal pain, blood in stool.  Chest xray shows possible right lower lobe pneumonia.   Past Medical History  Diagnosis Date  . Pneumonia   . Hypertension   . Hypercholesteremia   . Diabetes mellitus   . Gout   . Irregular heart beat   . Kidney stone   . Lymphoma     s/p partial gastrectomy  . CHF (congestive heart failure)     Past Surgical History  Procedure Laterality Date  . Partial gastrectomy  1994    for lymphoma  . Hernia repair    . Joint replacement    . Back surgery    . Esophagogastroduodenoscopy N/A 10/28/2012    Procedure: ESOPHAGOGASTRODUODENOSCOPY (EGD);  Surgeon: Lear Ng, MD;  Location: [facility];  Service: Endoscopy;  Laterality: N/A;    Prior to Admission medications   Medication Sig Start Date End Date Taking? Authorizing Provider  allopurinol (ZYLOPRIM) 100 MG tablet Take 100 mg by mouth daily.   Yes Historical Provider, MD  aspirin EC 81 MG tablet Take 1 tablet (81 mg total) by mouth daily. 10/19/13  Yes Jolaine Artist, MD  carvedilol (COREG) 12.5 MG tablet Take 1 tablet (12.5 mg total) by mouth 2 (two) times daily with a meal. 10/19/13  Yes Jolaine Artist, MD  ferrous sulfate 325 (65 FE) MG tablet Take 325 mg by mouth 2 (two) times daily with a meal.   Yes Historical Provider, MD  gabapentin (NEURONTIN) 100 MG capsule Take 100 mg by mouth 2 (two) times daily.   Yes Historical Provider, MD   glipiZIDE (GLUCOTROL) 10 MG tablet Take 5 mg by mouth daily.    Yes Historical Provider, MD  hydrALAZINE (APRESOLINE) 50 MG tablet Take 50 mg by mouth 3 (three) times daily.   Yes Historical Provider, MD  HYDROcodone-acetaminophen (NORCO/VICODIN) 5-325 MG per tablet Take 1-2 tablets by mouth every 4 (four) hours as needed for moderate pain.   Yes Historical Provider, MD  isosorbide dinitrate (ISORDIL) 20 MG tablet Take 20 mg by mouth 3 (three) times daily.   Yes Historical Provider, MD  metolazone (ZAROXOLYN) 2.5 MG tablet Take 2.5 mg by mouth every other day.   Yes Historical Provider, MD  mirtazapine (REMERON) 15 MG tablet Take 7.5 mg by mouth at bedtime.   Yes Historical Provider, MD  niacin 500 MG tablet Take 1,000 mg by mouth at bedtime.    Yes Historical Provider, MD  omeprazole (PRILOSEC) 20 MG capsule Take 20 mg by mouth 2 (two) times daily before a meal.   Yes Historical Provider, MD  psyllium (METAMUCIL) 58.6 % powder Take 1 packet by mouth 3 (three) times daily.   Yes Historical Provider, MD  senna-docusate (SENOKOT-S) 8.6-50 MG per tablet Take 2 tablets by mouth 2 (two) times daily as needed for mild constipation.   Yes Historical Provider, MD  sertraline (ZOLOFT) 100 MG tablet Take 200 mg by mouth at bedtime.    Yes Historical Provider, MD  simvastatin (ZOCOR) 20 MG tablet Take 10 mg by mouth daily.   Yes Historical Provider, MD  torsemide (DEMADEX) 20 MG tablet Take 20 mg by mouth 2 (two) times daily.   Yes Historical Provider, MD  traZODone (DESYREL) 50 MG tablet Take 50 mg by mouth at bedtime.   Yes Historical Provider, MD  carboxymethylcellulose 1 % ophthalmic solution Apply 1 drop to eye 4 (four) times daily.    Historical Provider, MD    Current Facility-Administered Medications  Medication Dose Route Frequency Provider Last Rate Last Dose  . HYDROcodone-acetaminophen (NORCO/VICODIN) 5-325 MG per tablet 2 tablet  2 tablet Oral Once Johnna Acosta, MD       Current Outpatient  Prescriptions  Medication Sig Dispense Refill  . allopurinol (ZYLOPRIM) 100 MG tablet Take 100 mg by mouth daily.      Marland Kitchen aspirin EC 81 MG tablet Take 1 tablet (81 mg total) by mouth daily.  30 tablet  3  . carvedilol (COREG) 12.5 MG tablet Take 1 tablet (12.5 mg total) by mouth 2 (two) times daily with a meal.  60 tablet  3  . ferrous sulfate 325 (65 FE) MG tablet Take 325 mg by mouth 2 (two) times daily with a meal.      . gabapentin (NEURONTIN) 100 MG capsule Take 100 mg by mouth 2 (two) times daily.      Marland Kitchen glipiZIDE (GLUCOTROL) 10 MG tablet Take 5 mg by mouth daily.       . hydrALAZINE (APRESOLINE) 50 MG tablet Take 50 mg by mouth 3 (three) times daily.      Marland Kitchen HYDROcodone-acetaminophen (NORCO/VICODIN) 5-325 MG per tablet Take 1-2 tablets by mouth every 4 (four) hours as needed for moderate pain.      . isosorbide dinitrate (ISORDIL) 20 MG tablet Take 20 mg by mouth 3 (three) times daily.      . metolazone (ZAROXOLYN) 2.5 MG tablet Take 2.5 mg by mouth every other day.      . mirtazapine (REMERON) 15 MG tablet Take 7.5 mg by mouth at bedtime.      . niacin 500 MG tablet Take 1,000 mg by mouth at bedtime.       Marland Kitchen omeprazole (PRILOSEC) 20 MG capsule Take 20 mg by mouth 2 (two) times daily before a meal.      . psyllium (METAMUCIL) 58.6 % powder Take 1 packet by mouth 3 (three) times daily.      Marland Kitchen senna-docusate (SENOKOT-S) 8.6-50 MG per tablet Take 2 tablets by mouth 2 (two) times daily as needed for mild constipation.      . sertraline (ZOLOFT) 100 MG tablet Take 200 mg by mouth at bedtime.       . simvastatin (ZOCOR) 20 MG tablet Take 10 mg by mouth daily.      Marland Kitchen torsemide (DEMADEX) 20 MG tablet Take 20 mg by mouth 2 (two) times daily.      . traZODone (DESYREL) 50 MG tablet Take 50 mg by mouth at bedtime.      . carboxymethylcellulose 1 % ophthalmic solution Apply 1 drop to eye 4 (four) times daily.        Allergies as of 03/11/2014  . (No Known Allergies)    History reviewed. No  pertinent family history.  History   Social History  . Marital Status: Widowed    Spouse Name: N/A    Number of Children: N/A  . Years of Education: N/A   Occupational History  . Retired  [employer]    Social History Main Topics  . Smoking status: Never Smoker   . Smokeless tobacco: Never Used  . Alcohol Use: No  . Drug Use: No  . Sexual Activity: No   Other Topics Concern  . Not on file   Social History Narrative   Widowed, lives alone.  Has family in the area.  Most of his medical care comes through the [place] in [place].    Review of Systems: As per HPI, all others negative  Physical Exam: Vital signs in last 24 hours: Temp:  [97.9 F (36.6 C)-99 F (37.2 C)] 97.9 F (36.6 C) (07/11 0812) Pulse Rate:  [60-116] 72 (07/11 0812) Resp:  [16-22] 16 (07/11 0812) BP: (138-144)/(65-68) 144/68 mmHg (07/11 0812) SpO2:  [96 %-100 %] 100 % (07/11 0812)   General:   Alert,  Well-developed, well-nourished, pleasant and cooperative in NAD Head:  Normocephalic and atraumatic. Eyes:  Sclera clear, no icterus.   Conjunctiva pink. Ears:  Normal auditory acuity. Nose:  No deformity, discharge,  or lesions. Mouth:  No deformity or lesions.  Oropharynx pink & moist. Neck:  Supple; no masses or thyromegaly. Lungs:  Clear throughout to auscultation.   No wheezes, crackles, or rhonchi. No acute distress. Heart:  Regular rate and rhythm; no murmurs, clicks, rubs,  or gallops. Abdomen:  Soft, nontender and nondistended. No masses, hepatosplenomegaly or hernias noted. Normal bowel sounds, without guarding, and without rebound.     Msk:  Symmetrical without gross deformities. Normal posture. Pulses:  Normal pulses noted. Extremities:  Without clubbing or edema. Neurologic:  Alert and  oriented x4;  grossly normal neurologically. Skin:  Intact without significant lesions or rashes. Psych:  Alert and cooperative. Normal mood and affect.   Lab Results:  Recent Labs  03/11/14 0457  WBC 7.8   HGB 11.2*  HCT 32.9*  PLT 316   BMET  Recent Labs  03/11/14 0457  NA 137  K 3.6*  CL 100  CO2 22  GLUCOSE 145*  BUN 46*  CREATININE 2.15*  CALCIUM 9.2   LFT No results found for this basename: PROT, ALBUMIN, AST, ALT, ALKPHOS, BILITOT, BILIDIR, IBILI,  in the last 72 hours PT/INR No results found for this basename: LABPROT, INR,  in the last 72 hours  Studies/Results: Dg Neck Soft Tissue  03/11/2014   CLINICAL DATA:  Dysphagia.  EXAM: NECK SOFT TISSUES - 1+ VIEW  COMPARISON:  CT of the cervical spine September 25, 2013  FINDINGS: There is no evidence of retropharyngeal soft tissue swelling or epiglottic enlargement. The cervical airway is unremarkable and no radio-opaque foreign body identified.  Multiple linear radiopaque foreign bodies about the oral cavity consistent with dental hardware, recommend direct inspection. Mild lateral bilateral soft tissue calcifications may be vascular. Bridging syndesmophytes most consistent with ankylosing spondylitis. Bilateral humeral arthroplasties.  IMPRESSION: No radiopaque foreign bodies along the included aerodigestive tract.   Electronically Signed   By: Elon Alas   On: 03/11/2014 04:34   Dg Chest 2 View  03/11/2014   CLINICAL DATA:  Patient swallowed a pill and felt like it got stuck.  EXAM: CHEST  2 VIEW  COMPARISON:  12/03/2013  FINDINGS: Cardiac enlargement without vascular congestion. Airspace disease in the right lung days suggesting developing infiltration or pneumonia. The no pneumothorax. No blunting of costophrenic angles. Degenerative changes in the spine and left shoulder. Loss of subacromial space consistent with chronic rotator cuff arthropathy on the left. Postoperative changes with right reverse shoulder arthroplasty.  IMPRESSION: Developing airspace disease in the right lung base suggesting pneumonia.   Electronically Signed   By: Lucienne Capers M.D.   On: 03/11/2014 04:31    Impression:  1.  Foreign body  sensation-->pills stuck in the throat. 2.  Odynophagia. 3.  Inability to tolerate liquids. 4.  Pneumonia by xray but no shortness of breath, leukocytosis, or hypoxia.  Plan:  1.  Endoscopy for possible foreign body removal. 2.  Risks (bleeding, infection, bowel perforation that could require surgery, sedation-related changes in cardiopulmonary systems), benefits (identification and possible treatment of source of symptoms, exclusion of certain causes of symptoms), and alternatives (watchful waiting, radiographic imaging studies, empiric medical treatment) of upper endoscopy (EGD) were explained to patient/family in detail and patient wishes to proceed.   LOS: 0 days   Anvika Gashi M  03/11/2014, 9:22 AM

## 2014-03-11 NOTE — ED Notes (Signed)
Dr. Paulita Fujita has just seen pt. And is requesting that we hold pt. Here until his endoscopy team comes to get him; and not send him to [facility] [place] until after the procedure.  I notified Gregary Signs, RN of this request at this time.  He remains in no distress.

## 2014-03-11 NOTE — H&P (Signed)
[facility] History and Physical  [name] CWC:[id number] DOB: 09-14-1931 DOA: 03/11/2014  Referring physician: ED physician PCP: Leola Brazil, MD   Chief Complaint: difficulty with swallowing   HPI:  Pt is 80 yo male who presented to [facility] ED with main concern of progressively worsening difficulty with swallowing and feeling as if the pills are getting stuck. He denies similar events in the past, no fevers, chills, no chest pain, no shortness of breath. Pt denies recent sick contacts or exposures. In ED, worrisome for PNA, [facility] asked to admit for further evaluation and GI consulted.  Assessment and Plan: Active Problems:   PNA (pneumonia) - admit for further evaluation - place on Zithromax and Rocephin for now  - provide oxygen as needed - sputum analysis pending    HTN - continue home medical regimen    HLD - continue statin    Hypokalemia - supplement and repeat BMP in AM   Acute on chronic renal failure stage II - III - close monitoring, repeat BMP in AM  Radiological Exams on Admission: Dg Neck Soft Tissue  03/11/2014   CLINICAL DATA:  Dysphagia.  EXAM: NECK SOFT TISSUES - 1+ VIEW  COMPARISON:  CT of the cervical spine September 25, 2013  FINDINGS: There is no evidence of retropharyngeal soft tissue swelling or epiglottic enlargement. The cervical airway is unremarkable and no radio-opaque foreign body identified.  Multiple linear radiopaque foreign bodies about the oral cavity consistent with dental hardware, recommend direct inspection. Mild lateral bilateral soft tissue calcifications may be vascular. Bridging syndesmophytes most consistent with ankylosing spondylitis. Bilateral humeral arthroplasties.  IMPRESSION: No radiopaque foreign bodies along the included aerodigestive tract.   Electronically Signed   By: Elon Alas   On: 03/11/2014 04:34   Dg Chest 2 View  03/11/2014   CLINICAL DATA:  Patient swallowed a pill and felt like it got stuck.   EXAM: CHEST  2 VIEW  COMPARISON:  12/03/2013  FINDINGS: Cardiac enlargement without vascular congestion. Airspace disease in the right lung days suggesting developing infiltration or pneumonia. The no pneumothorax. No blunting of costophrenic angles. Degenerative changes in the spine and left shoulder. Loss of subacromial space consistent with chronic rotator cuff arthropathy on the left. Postoperative changes with right reverse shoulder arthroplasty.  IMPRESSION: Developing airspace disease in the right lung base suggesting pneumonia.   Electronically Signed   By: Lucienne Capers M.D.   On: 03/11/2014 04:31     Code Status: Full Family Communication: Pt at bedside Disposition Plan: Admit for further evaluation    Review of Systems:  Constitutional: Negative for diaphoresis.  HENT: Negative for hearing loss, ear pain, nosebleeds, congestion, sore throat, neck pain, tinnitus and ear discharge.   Eyes: Negative for blurred vision, double vision, photophobia, pain, discharge and redness.  Respiratory: Negative for wheezing and stridor.   Cardiovascular: Negative for claudication and leg swelling.  Gastrointestinal:  Negative for heartburn, constipation, blood in stool and melena.  Genitourinary: Negative for dysuria, urgency, frequency, hematuria and flank pain.  Musculoskeletal: Negative for myalgias, back pain, joint pain and falls.  Skin: Negative for itching and rash.  Neurological: Negative for dizziness, focal weakness, loss of consciousness and headaches.  Endo/Heme/Allergies: Negative for environmental allergies and polydipsia. Does not bruise/bleed easily.  Psychiatric/Behavioral: Negative for suicidal ideas. The patient is not nervous/anxious.      Past Medical History  Diagnosis Date  . Pneumonia   . Hypertension   . Hypercholesteremia   . Diabetes mellitus   .  Gout   . Irregular heart beat   . Kidney stone   . Lymphoma     s/p partial gastrectomy  . CHF (congestive heart  failure)     Past Surgical History  Procedure Laterality Date  . Partial gastrectomy  1994    for lymphoma  . Hernia repair    . Joint replacement    . Back surgery    . Esophagogastroduodenoscopy N/A 10/28/2012    Procedure: ESOPHAGOGASTRODUODENOSCOPY (EGD);  Surgeon: Lear Ng, MD;  Location: [facility];  Service: Endoscopy;  Laterality: N/A;    Social History:  reports that he has never smoked. He has never used smokeless tobacco. He reports that he does not drink alcohol or use illicit drugs.  No Known Allergies  History reviewed. No pertinent family history.  Prior to Admission medications   Medication Sig Start Date End Date Taking? Authorizing Provider  allopurinol (ZYLOPRIM) 100 MG tablet Take 100 mg by mouth daily.   Yes Historical Provider, MD  aspirin EC 81 MG tablet Take 1 tablet (81 mg total) by mouth daily. 10/19/13  Yes Jolaine Artist, MD  carvedilol (COREG) 12.5 MG tablet Take 1 tablet (12.5 mg total) by mouth 2 (two) times daily with a meal. 10/19/13  Yes Jolaine Artist, MD  ferrous sulfate 325 (65 FE) MG tablet Take 325 mg by mouth 2 (two) times daily with a meal.   Yes Historical Provider, MD  gabapentin (NEURONTIN) 100 MG capsule Take 100 mg by mouth 2 (two) times daily.   Yes Historical Provider, MD  glipiZIDE (GLUCOTROL) 10 MG tablet Take 5 mg by mouth daily.    Yes Historical Provider, MD  hydrALAZINE (APRESOLINE) 50 MG tablet Take 50 mg by mouth 3 (three) times daily.   Yes Historical Provider, MD  HYDROcodone-acetaminophen (NORCO/VICODIN) 5-325 MG per tablet Take 1-2 tablets by mouth every 4 (four) hours as needed for moderate pain.   Yes Historical Provider, MD  isosorbide dinitrate (ISORDIL) 20 MG tablet Take 20 mg by mouth 3 (three) times daily.   Yes Historical Provider, MD  metolazone (ZAROXOLYN) 2.5 MG tablet Take 2.5 mg by mouth every other day.   Yes Historical Provider, MD  mirtazapine (REMERON) 15 MG tablet Take 7.5 mg by mouth at  bedtime.   Yes Historical Provider, MD  niacin 500 MG tablet Take 1,000 mg by mouth at bedtime.    Yes Historical Provider, MD  omeprazole (PRILOSEC) 20 MG capsule Take 20 mg by mouth 2 (two) times daily before a meal.   Yes Historical Provider, MD  psyllium (METAMUCIL) 58.6 % powder Take 1 packet by mouth 3 (three) times daily.   Yes Historical Provider, MD  senna-docusate (SENOKOT-S) 8.6-50 MG per tablet Take 2 tablets by mouth 2 (two) times daily as needed for mild constipation.   Yes Historical Provider, MD  sertraline (ZOLOFT) 100 MG tablet Take 200 mg by mouth at bedtime.    Yes Historical Provider, MD  simvastatin (ZOCOR) 20 MG tablet Take 10 mg by mouth daily.   Yes Historical Provider, MD  torsemide (DEMADEX) 20 MG tablet Take 20 mg by mouth 2 (two) times daily.   Yes Historical Provider, MD  traZODone (DESYREL) 50 MG tablet Take 50 mg by mouth at bedtime.   Yes Historical Provider, MD  carboxymethylcellulose 1 % ophthalmic solution Apply 1 drop to eye 4 (four) times daily.    Historical Provider, MD    Physical Exam: Filed Vitals:   03/11/14 1125 03/11/14  1233 03/11/14 1802 03/11/14 2134  BP: 128/45 141/68 142/56 126/70  Pulse: 75 68 65 72  Temp:  98.1 F (36.7 C)  98.2 F (36.8 C)  TempSrc:  Oral  Oral  Resp: 14 18  18   Height:  6\' 3"  (1.905 m)    Weight:  92.1 kg (203 lb 0.7 oz)    SpO2: 100% 97%  97%    Physical Exam  Constitutional: Appears well-developed and well-nourished. No distress.  HENT: Normocephalic. External right and left ear normal. Dry MM Eyes: Conjunctivae and EOM are normal. PERRLA, no scleral icterus.  Neck: Normal ROM. Neck supple. No JVD. No tracheal deviation. No thyromegaly.  CVS: RRR, no gallops, no carotid bruit.  Pulmonary: Effort and breath sounds normal, no stridor, rhonchi, wheezes, rales.  Abdominal: Soft. BS +,  no distension, tenderness, rebound or guarding.  Musculoskeletal: Normal range of motion. No edema and no tenderness.   Lymphadenopathy: No lymphadenopathy noted, cervical, inguinal. Neuro: Alert. Normal reflexes, muscle tone coordination. No cranial nerve deficit. Skin: Skin is warm and dry. No rash noted. Not diaphoretic. No erythema. No pallor.  Psychiatric: Normal mood and affect. Behavior, judgment, thought content normal.   Labs on Admission:  Basic Metabolic Panel:  Recent Labs Lab 03/11/14 0457  NA 137  K 3.6*  CL 100  CO2 22  GLUCOSE 145*  BUN 46*  CREATININE 2.15*  CALCIUM 9.2   CBC:  Recent Labs Lab 03/11/14 0457  WBC 7.8  NEUTROABS 5.5  HGB 11.2*  HCT 32.9*  MCV 89.4  PLT 316   CBG:  Recent Labs Lab 03/11/14 2139  GLUCAP 156*    EKG: Normal sinus rhythm, no ST/T wave changes  Faye Ramsay, MD  [facility] Pager [phone]  If 7PM-7AM, please contact night-coverage [email or web address] Password [facility] 03/11/2014, 11:12 PM

## 2014-03-11 NOTE — ED Notes (Signed)
Pt was taking his medication yesterday around noon and he took 3 pills at once,  (known esophagus problems) and he didn't get all of them down,  Says he can feel it stuck and that his voice is changing.

## 2014-03-11 NOTE — Evaluation (Addendum)
Clinical/Bedside Swallow Evaluation Patient Details  Name: [name] MRN: [id number] Date of Birth: 1931/12/02  Today's Date: 03/11/2014 Time: 3295-1884 SLP Time Calculation (min): 28 min  Past Medical History:  Past Medical History  Diagnosis Date  . Pneumonia   . Hypertension   . Hypercholesteremia   . Diabetes mellitus   . Gout   . Irregular heart beat   . Kidney stone   . Lymphoma     s/p partial gastrectomy  . CHF (congestive heart failure)    Past Surgical History:  Past Surgical History  Procedure Laterality Date  . Partial gastrectomy  1994    for lymphoma  . Hernia repair    . Joint replacement    . Back surgery    . Esophagogastroduodenoscopy N/A 10/28/2012    Procedure: ESOPHAGOGASTRODUODENOSCOPY (EGD);  Surgeon: Lear Ng, MD;  Location: [facility];  Service: Endoscopy;  Laterality: N/A;   HPI:  80 year old male with a history of esophageal stricture and a prior food impaction most recently dilated to September of 2012 admitted with  difficulty swallowing after 3 pills lodged in his throat.  Additional PMH includes DM, CHF, partial gastrectomy, hernia repair.  Per MD note pt. has been able to tolerate liquids but states that he has significant discomfort in his upper chest and neck with swallowing.  EGD 03/11/14 revealed Subjective tightening at lower esophageal sphincter with possible distal esophageal web.  Suspect patient has esophageal dysmotility (perhaps even achalasia), which might in turn have led to some aspiration which could account for his possible right lower lobe pneumonia. However, no evidence of esophageal foreign body was identified.  GI recommendations includedConsider Speech Therapy consultation tomorrow for evaluation of possible (micro)aspiration. 4. Might ultimately need further work-up of esophageal motilitydisorder (esophagram +/- esophageal manometry), pending Speech Therapy evaluation.  No MBS study on record.  CXR 7/11  Developing airspace disease in the right lung base suggesting pneumonia.  CT cervical spine 09/24/13 showed bridging osteophytes along the anterior cervical spine result in effective osseous fusion of C2-C7. Grade 1 anterolisthesis of C7 on T1 is perhaps minimally more prominent than in 2013.   Assessment / Plan / Recommendation Clinical Impression  Pt. with indications of pharyngeal impairment in addition to/or caused by known/previously diagnosed history of esophageal dysphagia (stricture, dilation, possible achalasia per EGD 7/11).  Prior to po's pt. expectorating mucous; audible/sluggish swallow indicative of possible structural component (diagnosed cervial osteophyte), immediate cough with one of six cup sips water, wet vocal quality throughout assessment.  Suspect likely aspiration after the swallow from esophageal stasis and possible aspiration during the swallow.  Pt. would benefit from MBS (no record of MBS performed at [facility]), however he refuses stating "the lord is the only one who can heal me. I don't need that test." SLP continued to explain clinical reasoning and consequences with pt. Refusal. Educated/reviewed esophageal precautions with pt. although pt. able to state He "can't eat fast, has to take small sips" and appears to have awareness of esophageal symptoms.  Recommend MD upgrade to solids as appropriate/as pt. Able.  ST will sign off at this time.       Aspiration Risk   (mod-severe)    Diet Recommendation Thin liquid (advancing as able per MD)   Liquid Administration via: Cup;No straw Medication Administration: Crushed with puree Supervision: Patient able to self feed;Full supervision/cueing for compensatory strategies Compensations: Slow rate;Small sips/bites;Follow solids with liquid;Clear throat intermittently;Multiple dry swallows after each bite/sip Postural Changes and/or Swallow Maneuvers: Seated  upright 90 degrees;Upright 30-60 min after meal    Other  Recommendations  Recommended Consults: MBS Oral Care Recommendations: Oral care BID   Follow Up Recommendations  None    Frequency and Duration        Pertinent Vitals/Pain WDL         Swallow Study         Oral/Motor/Sensory Function Overall Oral Motor/Sensory Function: Appears within functional limits for tasks assessed   Ice Chips Ice chips: Not tested   Thin Liquid Thin Liquid: Impaired Presentation: Cup Pharyngeal  Phase Impairments: Wet Vocal Quality;Cough - Immediate;Cough - Delayed;Throat Clearing - Delayed (audible swallow)    Nectar Thick Nectar Thick Liquid: Not tested   Honey Thick Honey Thick Liquid: Not tested   Puree Puree: Impaired Presentation: Spoon;Self Fed Pharyngeal Phase Impairments: Wet Vocal Quality;Throat Clearing - Delayed   Solid   GO    Solid: Not tested       Houston Siren M.Ed [employer] [phone]  03/11/2014

## 2014-03-11 NOTE — Op Note (Signed)
[facility] [address] [place], [address]   ENDOSCOPY PROCEDURE REPORT  PATIENT: [name], [name]  MR#: [id number] BIRTHDATE: 1932-08-09 , 82  yrs. old GENDER: Male ENDOSCOPIST: Arta Silence, MD REFERRED BY:  [facility] PROCEDURE DATE:  03/11/2014 PROCEDURE:  EGD, diagnostic ASA CLASS:     Class III INDICATIONS:  odynophagia, foreign body sensation in throat. MEDICATIONS: Fentanyl 50 mcg IV and Versed 4 mg IV TOPICAL ANESTHETIC: Cetacaine Spray  DESCRIPTION OF PROCEDURE: After the risks benefits and alternatives of the procedure were thoroughly explained, informed consent was obtained.  The [place] [id number] endoscope was introduced through the mouth and advanced to the stomach body. Without limitations.  The instrument was slowly withdrawn as the mucosa was fully examined.     Findings:  Fluid and small amount of debris scattered throughout esophagus.  No esophageal foreign body.  No esophagitis seen. Subjective tightening at lower esophageal sphincter with possible distal esophageal web.  Bile gastritis noted, in otherwise normal appearance of Bilroth-II gastrojejunostomy.  Proximal 5cm of both limbs of jejunostomy were normal.               The scope was then withdrawn from the patient and the procedure completed.  ENDOSCOPIC IMPRESSION:     As above.  Suspect patient has esophageal dysmotility (perhaps even achalasia), which might in turn have led to some aspiration which could account for his possible right lower lobe pneumonia.  However, no evidence of esophageal foreign body was identified.  RECOMMENDATIONS:     1.  Watch for potential complications of procedure. 2.  Sucralfate suspension. 3.  Consider Speech Therapy consultation tomorrow for evaluation of possible (micro)aspiration. 4.  Might ultimately need further work-up of esophageal motility disorder (esophagram +/- esophageal manometry), pending Speech Therapy  evaluation. 5.  [facility] will revisit Monday.  eSigned:  Arta Silence, MD 03/11/2014 11:41 AM   CC:

## 2014-03-12 ENCOUNTER — Inpatient Hospital Stay (HOSPITAL_COMMUNITY): Payer: Medicare Other

## 2014-03-12 LAB — BASIC METABOLIC PANEL
Anion gap: 17 — ABNORMAL HIGH (ref 5–15)
BUN: 39 mg/dL — AB (ref 6–23)
CHLORIDE: 103 meq/L (ref 96–112)
CO2: 18 mEq/L — ABNORMAL LOW (ref 19–32)
Calcium: 9.1 mg/dL (ref 8.4–10.5)
Creatinine, Ser: 2.07 mg/dL — ABNORMAL HIGH (ref 0.50–1.35)
GFR calc Af Amer: 33 mL/min — ABNORMAL LOW (ref >90)
GFR calc non Af Amer: 29 mL/min — ABNORMAL LOW (ref >90)
Glucose, Bld: 138 mg/dL — ABNORMAL HIGH (ref 70–99)
Potassium: 3.9 mEq/L (ref 3.7–5.3)
SODIUM: 138 meq/L (ref 137–147)

## 2014-03-12 LAB — CBC
HCT: 36.3 % — ABNORMAL LOW (ref 39.0–52.0)
HEMOGLOBIN: 11.7 g/dL — AB (ref 13.0–17.0)
MCH: 29.8 pg (ref 26.0–34.0)
MCHC: 32.2 g/dL (ref 30.0–36.0)
MCV: 92.4 fL (ref 78.0–100.0)
Platelets: 293 10*3/uL (ref 150–400)
RBC: 3.93 MIL/uL — AB (ref 4.22–5.81)
RDW: 15.3 % (ref 11.5–15.5)
WBC: 13.4 10*3/uL — ABNORMAL HIGH (ref 4.0–10.5)

## 2014-03-12 LAB — EXPECTORATED SPUTUM ASSESSMENT W REFEX TO RESP CULTURE: Special Requests: NORMAL

## 2014-03-12 LAB — STREP PNEUMONIAE URINARY ANTIGEN: Strep Pneumo Urinary Antigen: NEGATIVE

## 2014-03-12 MED ORDER — DEXTROSE 5 % IV SOLN: 1.0000 g | Freq: Three times a day (TID) | INTRAVENOUS | Status: DC

## 2014-03-12 MED ORDER — POTASSIUM CHLORIDE CRYS ER 20 MEQ PO TBCR: 40.0000 meq | EXTENDED_RELEASE_TABLET | Freq: Once | ORAL | Status: DC

## 2014-03-12 MED ORDER — DEXTROSE 5 % IV SOLN
1.0000 g | INTRAVENOUS | Status: DC
  Administered 2014-03-12 – 2014-03-15 (×4): 1 g via INTRAVENOUS
  Filled 2014-03-12 (×4): qty 1

## 2014-03-12 MED ORDER — VANCOMYCIN HCL IN DEXTROSE 1-5 GM/200ML-% IV SOLN
1000.0000 mg | INTRAVENOUS | Status: DC
  Administered 2014-03-12 – 2014-03-15 (×4): 1000 mg via INTRAVENOUS
  Filled 2014-03-12 (×4): qty 200

## 2014-03-12 MED ORDER — FUROSEMIDE 10 MG/ML IJ SOLN
20.0000 mg | Freq: Once | INTRAMUSCULAR | Status: DC
  Administered 2014-03-12: 20 mg via INTRAVENOUS
  Filled 2014-03-12: qty 2

## 2014-03-12 NOTE — Progress Notes (Signed)
ANTIBIOTIC CONSULT NOTE - INITIAL  Pharmacy Consult for Vancomycin, Renal dose adjustments Indication: pneumonia  No Known Allergies  Patient Measurements: Height: 6\' 3"  (190.5 cm) Weight: 203 lb 0.7 oz (92.1 kg) IBW/kg (Calculated) : 84.5  Vital Signs: Temp: 98.1 F (36.7 C) (07/12 0621) Temp src: Oral (07/12 0621) BP: 123/69 mmHg (07/12 0621) Pulse Rate: 76 (07/12 0621) Intake/Output from previous day: 07/11 0701 - 07/12 0700 In: 1065.8 [P.O.:240; I.V.:525.8; IV Piggyback:300] Out: 550 [Urine:550] Intake/Output from this shift:    Labs:  Recent Labs  03/11/14 0457 03/12/14 0527  WBC 7.8 13.4*  HGB 11.2* 11.7*  PLT 316 293  CREATININE 2.15* 2.07*   Estimated Creatinine Clearance: 34 ml/min (by C-G formula based on Cr of 2.07). No results found for this basename: VANCOTROUGH, Corlis Leak, VANCORANDOM, [place], GENTPEAK, GENTRANDOM, TOBRATROUGH, TOBRAPEAK, TOBRARND, AMIKACINPEAK, AMIKACINTROU, AMIKACIN,  in the last 72 hours   Microbiology: Recent Results (from the past 720 hour(s))  CULTURE, EXPECTORATED SPUTUM-ASSESSMENT     Status: None   Collection Time    03/11/14  8:00 PM      Result Value Ref Range Status   Specimen Description SPUTUM   Final   Special Requests NONE   Final   Sputum evaluation     Final   Value: MICROSCOPIC FINDINGS SUGGEST THAT THIS SPECIMEN IS NOT REPRESENTATIVE OF LOWER RESPIRATORY SECRETIONS. PLEASE RECOLLECT.     CALLED REINERT,L/4E @2207  ON 03/11/14 BY KARCZEWSKI,S.   Report Status 03/11/2014 FINAL   Final  CULTURE, EXPECTORATED SPUTUM-ASSESSMENT     Status: None   Collection Time    03/12/14  5:18 AM      Result Value Ref Range Status   Specimen Description SPUTUM   Final   Special Requests Normal   Final   Sputum evaluation     Final   Value: THIS SPECIMEN IS ACCEPTABLE. RESPIRATORY CULTURE REPORT TO FOLLOW.   Report Status 03/12/2014 FINAL   Final    Medical History: Past Medical History  Diagnosis Date  . Pneumonia    . Hypertension   . Hypercholesteremia   . Diabetes mellitus   . Gout   . Irregular heart beat   . Kidney stone   . Lymphoma     s/p partial gastrectomy  . CHF (congestive heart failure)     Assessment: 48 yom with PMH of HTN, HDL, and AoCKD. Presented to [facility] ED with main concern of progressively worsening difficulty with swallowing and feeling as if the pills are getting stuck. In ED, worrisome for PNA. Pharmacy consulted to dose Vanco/Cefepime for possible HCAP  7/12 >>Azithromycin >> 7/12 7/12 >>Rocephin >> 7/12 7/12 >> Vanco >> 7/12 >> Cefepime >>  Tmax: afebrile WBCs: increasing 7.8 -> 13.4 Renal: SCr 2.07 with CrCl 69ml/min  Goal of Therapy:  Vancomycin trough level 15-20 mcg/ml Appropriate antibiotic dosing for renal function; eradication of infection  Plan:  Start Vancomycin 1g IV Q24H Adjust Cefepime to 1g IV Q24H for renal function. Measure antibiotic drug levels at steady state Follow up culture results  Kizzie Furnish, PharmD Pager: [phone] 03/12/2014 8:53 AM

## 2014-03-12 NOTE — Progress Notes (Signed)
Patient ID: [name], male   DOB: Nov 04, 1931, 80 y.o.   MRN: [id number]  [facility] PROGRESS NOTE  [name]:[id number] DOB: 08-Sep-1931 DOA: 03/11/2014 PCP: Leola Brazil, MD  Brief narrative: Pt is 80 yo male who presented to [facility] ED with main concern of progressively worsening difficulty with swallowing and feeling as if the pills are getting stuck. He denies similar events in the past, no fevers, chills, no chest pain, no shortness of breath. Pt denies recent sick contacts or exposures. In ED, worrisome for PNA, [facility] asked to admit for further evaluation and GI consulted.   Assessment and Plan:  Active Problems:  Acute hypoxic respiratory failure secondary to PNA (pneumonia), CAP - no clinical improvement and pt still with significant cough and says more dyspnea this AM - WBC is up from yesterday, will therefore increase ABX coverage to broader spectrum for now - repeat CXR today  - urine legionella and strep pneumo pending  - sputum analysis pending  Odynophagia  - per EGD, ? esophageal dysmotility (perhaps even achalasia), which might in turn have led to some aspiration which could account for his possible right lower lobe pneumonia - no evidence of esophageal foreign body noted on EGD - per GI rec's, continue Sucralfate suspension.  - SLP evaluation appreciated  HTN, accelerated  - continue home medical regimen  - BP remains stable over the past 24 hours  HLD  - continue statin  Hypokalemia  - supplemented and WNL this AM Acute on chronic renal failure stage II - III  - likely pre renal - Cr is trending down, repeat BMP in AM Severe malnutrition - with high risk aspiration - continue to advance diet as pt able to tolerate, advance to soft diet if possible  Consultants:  None  Procedures/Studies: Dg Neck Soft Tissue  03/11/2014   No radiopaque foreign bodies along the included aerodigestive tract.    Dg Chest 2 View   03/11/2014   Developing  airspace disease in the right lung base suggesting pneumonia. Antibiotics:  Zithromax 7/11 --> 7/12  Rocephin 7/11 --> 7/12  Maxipime 7/12 -->  Vancomycin 7/12 -->   Code Status: Full Family Communication: Pt at bedside Disposition Plan: Home when medically stable  HPI/Subjective: No events overnight.   Objective: Filed Vitals:   03/11/14 1233 03/11/14 1802 03/11/14 2134 03/12/14 0621  BP: 141/68 142/56 126/70 123/69  Pulse: 68 65 72 76  Temp: 98.1 F (36.7 C)  98.2 F (36.8 C) 98.1 F (36.7 C)  TempSrc: Oral  Oral Oral  Resp: 18  18 18   Height: 6\' 3"  (1.905 m)     Weight: 92.1 kg (203 lb 0.7 oz)     SpO2: 97%  97% 97%    Intake/Output Summary (Last 24 hours) at 03/12/14 0836 Last data filed at 03/12/14 0600  Gross per 24 hour  Intake 1065.83 ml  Output    550 ml  Net 515.83 ml    Exam:   General:  Pt is alert, follows commands appropriately, not in acute distress  Cardiovascular: Regular rate and rhythm, no rubs, no gallops  Respiratory: Bilateral rales noted with no wheezing, productive cough noted   Abdomen: Soft, non tender, non distended, bowel sounds present, no guarding  Extremities: pulses DP and PT palpable bilaterally  Neuro: Grossly nonfocal  Data Reviewed: Basic Metabolic Panel:  Recent Labs Lab 03/11/14 0457 03/12/14 0527  NA 137 138  K 3.6* 3.9  CL 100 103  CO2  22 18*  GLUCOSE 145* 138*  BUN 46* 39*  CREATININE 2.15* 2.07*  CALCIUM 9.2 9.1   Liver Function Tests: CBC:  Recent Labs Lab 03/11/14 0457 03/12/14 0527  WBC 7.8 13.4*  NEUTROABS 5.5  --   HGB 11.2* 11.7*  HCT 32.9* 36.3*  MCV 89.4 92.4  PLT 316 293   CBG:  Recent Labs Lab 03/11/14 2139  GLUCAP 156*    Recent Results (from the past 240 hour(s))  CULTURE, EXPECTORATED SPUTUM-ASSESSMENT     Status: None   Collection Time    03/11/14  8:00 PM      Result Value Ref Range Status   Specimen Description SPUTUM   Final   Special Requests NONE   Final    Sputum evaluation     Final   Value: MICROSCOPIC FINDINGS SUGGEST THAT THIS SPECIMEN IS NOT REPRESENTATIVE OF LOWER RESPIRATORY SECRETIONS. PLEASE RECOLLECT.     CALLED REINERT,L/4E @2207  ON 03/11/14 BY KARCZEWSKI,S.   Report Status 03/11/2014 FINAL   Final  CULTURE, EXPECTORATED SPUTUM-ASSESSMENT     Status: None   Collection Time    03/12/14  5:18 AM      Result Value Ref Range Status   Specimen Description SPUTUM   Final   Special Requests Normal   Final   Sputum evaluation     Final   Value: THIS SPECIMEN IS ACCEPTABLE. RESPIRATORY CULTURE REPORT TO FOLLOW.   Report Status 03/12/2014 FINAL   Final     Scheduled Meds: . allopurinol  100 mg Oral Daily  . aspirin EC  81 mg Oral Daily  . carvedilol  12.5 mg Oral BID WC  . ceFEPime (MAXIPIME) IV  1 g Intravenous 3 times per day  . enoxaparin (LOVENOX) injection  40 mg Subcutaneous Q24H  . ferrous sulfate  325 mg Oral BID WC  . furosemide  20 mg Intravenous Once  . hydrALAZINE  50 mg Oral TID  . isosorbide dinitrate  20 mg Oral TID  . metolazone  2.5 mg Oral QODAY  . mirtazapine  7.5 mg Oral QHS  . niacin  1,000 mg Oral QHS  . pantoprazole  40 mg Oral Daily  . potassium chloride  40 mEq Oral Once  . sertraline  200 mg Oral QHS  . simvastatin  10 mg Oral Daily  . sucralfate  1 g Oral TID WC & HS  . traZODone  50 mg Oral QHS   Continuous Infusions:  Faye Ramsay, MD  [facility] Pager [phone]  If 7PM-7AM, please contact night-coverage [email or web address] Password [facility] 03/12/2014, 8:36 AM   LOS: 1 day

## 2014-03-12 NOTE — Progress Notes (Signed)
Patient has seen speech therapy but declines modified barium swallow.  I inquired about this, and he tells me that [facility]."  I told him he probably has oropharyngeal dysphagia in addition to some type of esophageal dysmotility, and instructed him that these issues in conjunction were likely causing his swallowing troubles and troubles with aspiration.  Patient voiced understanding, but did not want anything else done.  He relays to me that the only reason he came to the hospital is that he thought there were pills in his throat (which, on endoscopy, there were not).  In light of his desire to pursue no further testing, I will sign-off.  Please call with any further questions.  Thank you for the consult.

## 2014-03-12 NOTE — Progress Notes (Signed)
Pt claiming IV dilaudid is not doing anything and is trying to convince the tech to get more for him after this RN has explained he cannot have more for 4 hours due to way PRN is ordered. Pt still asking tech to get the nurse to give her the med so he can have more. Will continue to monitor.

## 2014-03-12 NOTE — Evaluation (Signed)
Physical Therapy Evaluation Patient Details Name: [name] MRN: [id number] DOB: [birth year] Today's Date: 03/12/2014   History of Present Illness  80 yo male admitted with difficulty swallowing, Pna. Hx of HTN, DM, gout, lymphoma, CHF.   Clinical Impression  On eval, pt required Min assist for mobility-able to ambulate ~15 x2 in room with walker. Demonstrates general weakness, decreased activity tolerance, and impaired gait and balance. Pt states he plans to d/c home.     Follow Up Recommendations Home health PT;Supervision/Assistance - 24 hour;Home Health Aide    Equipment Recommendations  None recommended by PT    Recommendations for Other Services OT consult     Precautions / Restrictions Precautions Precautions: Fall Restrictions Weight Bearing Restrictions: No      Mobility  Bed Mobility Overal bed mobility: Needs Assistance Bed Mobility: Supine to Sit     Supine to sit: [facility] elevated;Min assist     General bed mobility comments: Increased time.   Transfers Overall transfer level: Needs assistance Equipment used: Rolling walker (2 wheeled) Transfers: Sit to/from Stand Sit to Stand: Min assist         General transfer comment: Assist to rise, stabilize, control descent. VCs safety, technique, hand placement  Ambulation/Gait Ambulation/Gait assistance: Min guard Ambulation Distance (Feet): 15 Feet (x2) Assistive device: Rolling walker (2 wheeled) Gait Pattern/deviations: Trunk flexed;Step-to pattern;Decreased stride length     General Gait Details: very slow gait speed. close guard for safety.   Stairs            Wheelchair Mobility    Modified Rankin (Stroke Patients Only)       Balance Overall balance assessment: Needs assistance         Standing balance support: Bilateral upper extremity supported;During functional activity Standing balance-Leahy Scale: Poor                               Pertinent Vitals/Pain  Chronic pain-unrated    Home Living Family/patient expects to be discharged to:: Private residence Living Arrangements: Alone Available Help at Discharge:  (nephew?) Type of Home: House Home Access: Ramped entrance     Home Layout: One level Home Equipment: Environmental consultant - 4 wheels;Cane - single point;Electric scooter;Shower seat - built in;Grab bars - toilet;Adaptive equipment      Prior Function                 Hand Dominance        Extremity/Trunk Assessment   Upper Extremity Assessment: Generalized weakness           Lower Extremity Assessment: Generalized weakness;RLE deficits/detail RLE Deficits / Details: R foot drop    Cervical / Trunk Assessment: Kyphotic  Communication   Communication: Expressive difficulties  Cognition Arousal/Alertness: Awake/alert Behavior During Therapy: WFL for tasks assessed/performed Overall Cognitive Status: Within Functional Limits for tasks assessed                      General Comments      Exercises        Assessment/Plan    PT Assessment Patient needs continued PT services  PT Diagnosis Difficulty walking;Generalized weakness;Abnormality of gait   PT Problem List Decreased strength;Decreased range of motion;Decreased activity tolerance;Decreased balance;Decreased mobility;Pain  PT Treatment Interventions DME instruction;Gait training;Functional mobility training;Therapeutic activities;Therapeutic exercise;Patient/family education;Balance training   PT Goals (Current goals can be found in the Care Plan section) Acute Rehab PT Goals Patient Stated Goal:  home PT Goal Formulation: With patient Time For Goal Achievement: 03/26/14 Potential to Achieve Goals: Fair    Frequency Min 3X/week   Barriers to discharge        Co-evaluation               End of Session Equipment Utilized During Treatment: Gait belt Activity Tolerance: Patient limited by fatigue;Patient limited by pain Patient left: in  chair;with call bell/phone within reach;with chair alarm set           Time: 1410-1453 PT Time Calculation (min): 43 min   Charges:   PT Evaluation $Initial PT Evaluation Tier I: 1 Procedure PT Treatments $Gait Training: 8-22 mins $Therapeutic Activity: 23-37 mins   PT G Codes:          Weston Anna, MPT Pager: [phone]

## 2014-03-13 ENCOUNTER — Encounter (HOSPITAL_COMMUNITY): Payer: Self-pay | Admitting: Gastroenterology

## 2014-03-13 DIAGNOSIS — E440 Moderate protein-calorie malnutrition: Secondary | ICD-10-CM | POA: Diagnosis present

## 2014-03-13 LAB — CBC
HCT: 31.8 % — ABNORMAL LOW (ref 39.0–52.0)
Hemoglobin: 10.7 g/dL — ABNORMAL LOW (ref 13.0–17.0)
MCH: 30.4 pg (ref 26.0–34.0)
MCHC: 33.6 g/dL (ref 30.0–36.0)
MCV: 90.3 fL (ref 78.0–100.0)
PLATELETS: 280 10*3/uL (ref 150–400)
RBC: 3.52 MIL/uL — ABNORMAL LOW (ref 4.22–5.81)
RDW: 15.1 % (ref 11.5–15.5)
WBC: 15.4 10*3/uL — ABNORMAL HIGH (ref 4.0–10.5)

## 2014-03-13 LAB — LEGIONELLA ANTIGEN, URINE: Legionella Antigen, Urine: NEGATIVE

## 2014-03-13 LAB — BASIC METABOLIC PANEL
ANION GAP: 17 — AB (ref 5–15)
BUN: 43 mg/dL — ABNORMAL HIGH (ref 6–23)
CHLORIDE: 102 meq/L (ref 96–112)
CO2: 19 mEq/L (ref 19–32)
CREATININE: 2.6 mg/dL — AB (ref 0.50–1.35)
Calcium: 8.8 mg/dL (ref 8.4–10.5)
GFR calc non Af Amer: 22 mL/min — ABNORMAL LOW (ref >90)
GFR, EST AFRICAN AMERICAN: 25 mL/min — AB (ref >90)
Glucose, Bld: 147 mg/dL — ABNORMAL HIGH (ref 70–99)
Potassium: 3.4 mEq/L — ABNORMAL LOW (ref 3.7–5.3)
Sodium: 138 mEq/L (ref 137–147)

## 2014-03-13 LAB — GLUCOSE, CAPILLARY: Glucose-Capillary: 133 mg/dL — ABNORMAL HIGH (ref 70–99)

## 2014-03-13 MED ORDER — ENOXAPARIN SODIUM 30 MG/0.3ML ~~LOC~~ SOLN
30.0000 mg | SUBCUTANEOUS | Status: DC
  Administered 2014-03-13 – 2014-03-15 (×3): 30 mg via SUBCUTANEOUS
  Filled 2014-03-13 (×4): qty 0.3

## 2014-03-13 MED ORDER — POTASSIUM CHLORIDE CRYS ER 20 MEQ PO TBCR
40.0000 meq | EXTENDED_RELEASE_TABLET | Freq: Once | ORAL | Status: AC
  Administered 2014-03-13: 40 meq via ORAL
  Filled 2014-03-13: qty 2

## 2014-03-13 MED ORDER — TORSEMIDE 20 MG PO TABS
20.0000 mg | ORAL_TABLET | Freq: Two times a day (BID) | ORAL | Status: DC
  Administered 2014-03-13: 20 mg via ORAL
  Filled 2014-03-13 (×4): qty 1

## 2014-03-13 NOTE — Progress Notes (Signed)
Physical Therapy Treatment Patient Details Name: [name] MRN: [id number] DOB: 1931-09-25 Today's Date: 03/13/2014    History of Present Illness 80 yo male admitted with difficulty swallowing, Pna. Hx of HTN, DM, gout, lymphoma, CHF.     PT Comments    *Pt with decreased activity tolerance 2* fatigue. He walked 56' with 4 wheeled RW. He stated he mostly uses a motorized scooter at home so may be near baseline with mobility. SaO2 95% on RA walking. His son lives with him so he will have 36* assist at home. **  Follow Up Recommendations  Home health PT;Supervision/Assistance - 24 hour (home health aide)     Equipment Recommendations  None recommended by PT    Recommendations for Other Services OT consult     Precautions / Restrictions Precautions Precautions: Fall Restrictions Weight Bearing Restrictions: No    Mobility  Bed Mobility Overal bed mobility: Needs Assistance Bed Mobility: Supine to Sit     Supine to sit: HOB elevated;Modified independent (Device/Increase time)     General bed mobility comments: Increased time, HOB elevated  Transfers Overall transfer level: Needs assistance Equipment used: Rolling walker (2 wheeled) Transfers: Sit to/from Stand Sit to Stand: Min assist         General transfer comment: Assist to rise, stabilize, control descent. VCs safety, technique, hand placement  Ambulation/Gait Ambulation/Gait assistance: Min guard Ambulation Distance (Feet): 12 Feet Assistive device: 4-wheeled walker Gait Pattern/deviations: Trunk flexed;Decreased stride length;Step-to pattern     General Gait Details: very slow gait speed. close guard for safety, distance limited by fatigue   Stairs            Wheelchair Mobility    Modified Rankin (Stroke Patients Only)       Balance             Standing balance-Leahy Scale: Poor                      Cognition Arousal/Alertness: Awake/alert Behavior During  Therapy: WFL for tasks assessed/performed Overall Cognitive Status: Within Functional Limits for tasks assessed                      Exercises General Exercises - Lower Extremity Ankle Circles/Pumps: AROM;Left;10 reps;Supine Short Arc Quad: AROM;Both;10 reps;Supine Hip ABduction/ADduction: AAROM;Both;10 reps;Supine    General Comments        Pertinent Vitals/Pain *SaO2 87% on RA at rest, 95% on RA with walking Pt reports pain "all over", he was premedicated**    Home Living                      Prior Function            PT Goals (current goals can now be found in the care plan section) Acute Rehab PT Goals Patient Stated Goal: home PT Goal Formulation: With patient Time For Goal Achievement: 03/26/14 Potential to Achieve Goals: Fair Progress towards PT goals: Progressing toward goals    Frequency  Min 3X/week    PT Plan Current plan remains appropriate    Co-evaluation             End of Session Equipment Utilized During Treatment: Gait belt Activity Tolerance: Patient limited by fatigue Patient left: in chair;with call bell/phone within reach;with chair alarm set;with family/visitor present     Time: 2025-4270 PT Time Calculation (min): 24 min  Charges:  $Gait Training: 8-22 mins $Therapeutic Exercise: 8-22 mins  G Codes:      Blondell Reveal Kistler 03/13/2014, 10:02 AM [phone]

## 2014-03-13 NOTE — Progress Notes (Signed)
INITIAL NUTRITION ASSESSMENT  DOCUMENTATION CODES Per approved criteria  -Non-severe (moderate) malnutrition in the context of chronic illness  Pt meets criteria for moderate MALNUTRITION in the context of chronic illness as evidenced by moderate muscle wasting and subcutaneous fat loss, PO intake <75% for > one month.    INTERVENTION: -Recommend Ensure Pudding po TID, each supplement provides 170 kcal and 4 grams of protein -Will continue to monitor  NUTRITION DIAGNOSIS: Inadequate oral intake related to difficulty swallowing/decreased appetite as evidenced by PO intake <75%, wt loss.   Goal: Pt to meet >/= 90% of their estimated nutrition needs    Monitor:  Total protein/energy intake, labs, weights, swallow profile  Reason for Assessment: MST  80 y.o. male  Admitting Dx: <principal problem not specified>  ASSESSMENT: Pt is 80 yo male who presented to [facility] ED with main concern of progressively worsening difficulty with swallowing and feeling as if the pills are getting stuck  -Pt resting during time of assessment, did not provide detailed food/nutrition hx -Family member noted that pt has had decreased appetite, and has likely lost weight. Was unable to quantify weight loss or when it began. Previous medical records indicate a 20 lbs weight loss over past 5-6 months (9% body weight, non-severe for time frame) -Pt currently only taking a few bites of foods. MD noted pt with high aspiration risk, but refusing SLP evaluation. Has been placed on Soft diet -Family noted pt able to tolerate thin liquids, and would likely consume Ensure or Boost supplement. Will order in pudding form for additional texture tolerance Nutrition Focused Physical Exam:  Subcutaneous Fat:  Orbital Region: WDL Upper Arm Region: moderate depletion Thoracic and Lumbar Region: n/a  Muscle:  Temple Region: moderate depletion Clavicle Bone Region: moderate depletion Clavicle and Acromion Bone Region:  moderate depletion Scapular Bone Region: WDL Dorsal Hand: WDL Patellar Region: WDL Anterior Thigh Region: WDL Posterior Calf Region: WDL  Edema: none noted    Height: Ht Readings from Last 1 Encounters:  03/11/14 6\' 3"  (1.905 m)    Weight: Wt Readings from Last 1 Encounters:  03/11/14 203 lb 0.7 oz (92.1 kg)    Ideal Body Weight: 196 lbs  % Ideal Body Weight: 104%  Wt Readings from Last 10 Encounters:  03/11/14 203 lb 0.7 oz (92.1 kg)  03/11/14 203 lb 0.7 oz (92.1 kg)  02/27/14 212 lb 2 oz (96.219 kg)  12/03/13 225 lb (102.059 kg)  11/17/13 231 lb 6 oz (104.951 kg)  10/23/13 227 lb 15.3 oz (103.4 kg)  10/12/13 231 lb 8 oz (105.008 kg)  10/05/13 224 lb 1.6 oz (101.651 kg)  09/24/13 220 lb (99.791 kg)  08/11/13 192 lb 8 oz (87.317 kg)    Usual Body Weight: approximately 225 lbs per previous medical records  % Usual Body Weight: 91%  BMI:  Body mass index is 25.38 kg/(m^2).  Estimated Nutritional Needs: Kcal: 1850-2050 Protein: 90-100 gram Fluid: >/=1850 ml/daily  Skin: WDL  Diet Order: Criss Rosales  EDUCATION NEEDS: -No education needs identified at this time   Intake/Output Summary (Last 24 hours) at 03/13/14 1532 Last data filed at 03/12/14 2200  Gross per 24 hour  Intake    250 ml  Output    250 ml  Net      0 ml    Last BM: 7/12   Labs:   Recent Labs Lab 03/11/14 0457 03/12/14 0527 03/13/14 0508  NA 137 138 138  K 3.6* 3.9 3.4*  CL 100 103 102  CO2 22 18* 19  BUN 46* 39* 43*  CREATININE 2.15* 2.07* 2.60*  CALCIUM 9.2 9.1 8.8  GLUCOSE 145* 138* 147*    CBG (last 3)   Recent Labs  03/11/14 2139  GLUCAP 156*    Scheduled Meds: . allopurinol  100 mg Oral Daily  . aspirin EC  81 mg Oral Daily  . carvedilol  12.5 mg Oral BID WC  . ceFEPime (MAXIPIME) IV  1 g Intravenous Q24H  . enoxaparin (LOVENOX) injection  30 mg Subcutaneous Q24H  . ferrous sulfate  325 mg Oral BID WC  . hydrALAZINE  50 mg Oral TID  . isosorbide dinitrate   20 mg Oral TID  . metolazone  2.5 mg Oral QODAY  . mirtazapine  7.5 mg Oral QHS  . niacin  1,000 mg Oral QHS  . pantoprazole  40 mg Oral Daily  . sertraline  200 mg Oral QHS  . simvastatin  10 mg Oral Daily  . sucralfate  1 g Oral TID WC & HS  . torsemide  20 mg Oral BID  . traZODone  50 mg Oral QHS  . vancomycin  1,000 mg Intravenous Q24H    Continuous Infusions:   Past Medical History  Diagnosis Date  . Pneumonia   . Hypertension   . Hypercholesteremia   . Diabetes mellitus   . Gout   . Irregular heart beat   . Kidney stone   . Lymphoma     s/p partial gastrectomy  . CHF (congestive heart failure)     Past Surgical History  Procedure Laterality Date  . Partial gastrectomy  1994    for lymphoma  . Hernia repair    . Joint replacement    . Back surgery    . Esophagogastroduodenoscopy N/A 10/28/2012    Procedure: ESOPHAGOGASTRODUODENOSCOPY (EGD);  Surgeon: Lear Ng, MD;  Location: [facility];  Service: Endoscopy;  Laterality: N/A;  . Esophagogastroduodenoscopy Left 03/11/2014    Procedure: ESOPHAGOGASTRODUODENOSCOPY (EGD);  Surgeon: Arta Silence, MD;  Location: [facility];  Service: Endoscopy;  Laterality: Left;    Atlee Abide MS RD [place] Clinical Dietitian [phone]

## 2014-03-13 NOTE — Progress Notes (Signed)
Pt asking for cough medicine, but pt usually only coughs after eating or drinking food. Pt encouraged to cough so that PNA doesn't worsen.Marland Kitchen

## 2014-03-13 NOTE — Progress Notes (Addendum)
Patient ID: [name], male   DOB: 01-28-1932, 80 y.o.   MRN: [id number]  [facility] PROGRESS NOTE  [facility] MHD:[id number] DOB: 1932-04-19 DOA: 03/11/2014 PCP: Leola Brazil, MD  Brief narrative: Pt is 80 yo male who presented to [facility] ED with main concern of progressively worsening difficulty with swallowing and feeling as if the pills are getting stuck. He denies similar events in the past, no fevers, chills, no chest pain, no shortness of breath. Pt denies recent sick contacts or exposures. In ED, worrisome for PNA, [facility] asked to admit for further evaluation and GI consulted.   Assessment and Plan:  Active Problems:  Acute hypoxic respiratory failure secondary to PNA (pneumonia), CAP  - pt initially placed on Zithromax and Rocephin and since no clinical improvement noted, along with worsening PNA on CXR and worsening leukocytosis, ABX coverage broadened to Vanc and Maxipime July 12th, 2015 - pt is now clinically improving and reports feeling better this AM, less dyspnea at rest - urine legionella and strep pneumo pending  - sputum analysis pending - pt refusing swallow evaluation  Odynophagia  - per EGD, ? esophageal dysmotility (perhaps even achalasia), which might in turn have led to some aspiration which could account for his possible right lower lobe pneumonia  - no evidence of esophageal foreign body noted on EGD  - per GI rec's, continue Sucralfate suspension.  - SLP evaluation requested but pt refusing  - continue aspiration precautions  HTN, accelerated  - continue home medical regimen  - BP remains stable over the past 48 hours  HLD  - continue statin  Hypokalemia  - supplement and repeat BMP this AM Acute on chronic renal failure stage II - III  - Cr is trending up despite IVF, ? Vancomycin side effect - resume home dose Torsemide today 7/13 - repeat BMP in AM - monitor daily weights, intake and output, weight to day is 203 lbs  - if Cr up  tomorrow, consider stopping vancomycin  Severe malnutrition  - with high risk aspiration  - continue to advance diet as pt able to tolerate, advance to soft diet if possible  Acute on chronic blood loss anemia - likely from dilutional effect as pt has been on IVF, stop IVF and repeat CBC in AM  DVT prophylaxis: Lovenox GI prophylaxis: Protonix   Consultants:  None Procedures/Studies:  Dg Neck Soft Tissue 03/11/2014 No radiopaque foreign bodies along the included aerodigestive tract.  Dg Chest 2 View 03/11/2014 Developing airspace disease in the right lung base suggesting pneumonia. Dg Chest Port 1 View  03/12/2014   Worsening bibasilar heterogeneous airspace opacities, right greater than left, worrisome for progression of multifocal infection. A follow-up chest radiograph in 4 to 6 weeks after treatment is recommended to ensure resolution. Worsening slightly nodular thickening of the pulmonary interstitium, possibly accentuated due to technique though airways disease/bronchitis could have a similar appearance.   Antibiotics:  Zithromax 7/11 --> 7/12  Rocephin 7/11 --> 7/12  Maxipime 7/12 -->  Vancomycin 7/12 -->   Code Status: Full  Family Communication: Pt and niece at bedside  Disposition Plan: Home when medically stable  HPI/Subjective: No events overnight.   Objective: Filed Vitals:   03/12/14 1343 03/12/14 1600 03/12/14 2137 03/13/14 0526  BP: 108/55 107/66 115/60 122/52  Pulse: 74  58 80  Temp: 98.1 F (36.7 C)  98.3 F (36.8 C) 97.7 F (36.5 C)  TempSrc: Oral  Oral Oral  Resp: 20  18 18  Height:      Weight:      SpO2: 95%  96% 94%    Intake/Output Summary (Last 24 hours) at 03/13/14 1347 Last data filed at 03/12/14 2200  Gross per 24 hour  Intake    250 ml  Output    250 ml  Net      0 ml    Exam:   General:  Pt is alert, follows commands appropriately, not in acute distress  Cardiovascular: Regular rate and rhythm, no rubs, no gallops  Respiratory:  Bibasilar rales, much improved over the past 24 hours   Abdomen: Soft, non tender, non distended, bowel sounds present, no guarding  Extremities: pulses DP and PT palpable bilaterally  Neuro: Grossly nonfocal  Data Reviewed: Basic Metabolic Panel:  Recent Labs Lab 03/11/14 0457 03/12/14 0527 03/13/14 0508  NA 137 138 138  K 3.6* 3.9 3.4*  CL 100 103 102  CO2 22 18* 19  GLUCOSE 145* 138* 147*  BUN 46* 39* 43*  CREATININE 2.15* 2.07* 2.60*  CALCIUM 9.2 9.1 8.8   CBC:  Recent Labs Lab 03/11/14 0457 03/12/14 0527 03/13/14 0508  WBC 7.8 13.4* 15.4*  NEUTROABS 5.5  --   --   HGB 11.2* 11.7* 10.7*  HCT 32.9* 36.3* 31.8*  MCV 89.4 92.4 90.3  PLT 316 293 280   CBG:  Recent Labs Lab 03/11/14 2139  GLUCAP 156*    Recent Results (from the past 240 hour(s))  CULTURE, BLOOD (ROUTINE X 2)     Status: None   Collection Time    03/11/14  1:32 PM      Result Value Ref Range Status   Specimen Description BLOOD RIGHT HAND   Final   Special Requests BOTTLES DRAWN AEROBIC ONLY 4CC   Final   Culture  Setup Time     Final   Value: 03/11/2014 18:46     Performed at [facility]   Culture     Final   Value:        BLOOD CULTURE RECEIVED NO GROWTH TO DATE CULTURE WILL BE HELD FOR 5 DAYS BEFORE ISSUING A FINAL NEGATIVE REPORT     Performed at [facility]   Report Status PENDING   Incomplete  CULTURE, BLOOD (ROUTINE X 2)     Status: None   Collection Time    03/11/14  1:37 PM      Result Value Ref Range Status   Specimen Description BLOOD RIGHT ANTECUBITAL   Final   Special Requests BOTTLES DRAWN AEROBIC ONLY 4 CC    Final   Culture  Setup Time     Final   Value: 03/11/2014 18:46     Performed at [facility]   Culture     Final   Value:        BLOOD CULTURE RECEIVED NO GROWTH TO DATE CULTURE WILL BE HELD FOR 5 DAYS BEFORE ISSUING A FINAL NEGATIVE REPORT     Performed at [facility]   Report Status PENDING   Incomplete  CULTURE,  EXPECTORATED SPUTUM-ASSESSMENT     Status: None   Collection Time    03/11/14  8:00 PM      Result Value Ref Range Status   Specimen Description SPUTUM   Final   Special Requests NONE   Final   Sputum evaluation     Final   Value: MICROSCOPIC FINDINGS SUGGEST THAT THIS SPECIMEN IS NOT REPRESENTATIVE OF LOWER RESPIRATORY SECRETIONS. PLEASE RECOLLECT.  CALLED REINERT,L/4E @2207  ON 03/11/14 BY KARCZEWSKI,S.   Report Status 03/11/2014 FINAL   Final  CULTURE, EXPECTORATED SPUTUM-ASSESSMENT     Status: None   Collection Time    03/12/14  5:18 AM      Result Value Ref Range Status   Specimen Description SPUTUM   Final   Special Requests Normal   Final   Sputum evaluation     Final   Value: THIS SPECIMEN IS ACCEPTABLE. RESPIRATORY CULTURE REPORT TO FOLLOW.   Report Status 03/12/2014 FINAL   Final  CULTURE, RESPIRATORY (NON-EXPECTORATED)     Status: None   Collection Time    03/12/14  5:18 AM      Result Value Ref Range Status   Specimen Description SPUTUM   Final   Special Requests NONE   Final   Gram Stain     Final   Value: RARE WBC PRESENT, PREDOMINANTLY MONONUCLEAR     FEW SQUAMOUS EPITHELIAL CELLS PRESENT     RARE GRAM POSITIVE COCCI IN PAIRS     IN CLUSTERS RARE GRAM POSITIVE RODS     Performed at [facility]   Culture     Final   Value: Culture reincubated for better growth     Performed at [facility]   Report Status PENDING   Incomplete     Scheduled Meds: . allopurinol  100 mg Oral Daily  . aspirin EC  81 mg Oral Daily  . carvedilol  12.5 mg Oral BID WC  . ceFEPime (MAXIPIME) IV  1 g Intravenous Q24H  . enoxaparin (LOVENOX) injection  30 mg Subcutaneous Q24H  . ferrous sulfate  325 mg Oral BID WC  . hydrALAZINE  50 mg Oral TID  . isosorbide dinitrate  20 mg Oral TID  . metolazone  2.5 mg Oral QODAY  . mirtazapine  7.5 mg Oral QHS  . niacin  1,000 mg Oral QHS  . pantoprazole  40 mg Oral Daily  . sertraline  200 mg Oral QHS  . simvastatin  10 mg  Oral Daily  . sucralfate  1 g Oral TID WC & HS  . traZODone  50 mg Oral QHS  . vancomycin  1,000 mg Intravenous Q24H   Continuous Infusions:  Faye Ramsay, MD  [facility] Pager [phone]  If 7PM-7AM, please contact night-coverage [email or web address] Password [email or web address] 03/13/2014, 1:47 PM   LOS: 2 days

## 2014-03-14 ENCOUNTER — Inpatient Hospital Stay (HOSPITAL_COMMUNITY): Payer: Medicare Other

## 2014-03-14 DIAGNOSIS — J9600 Acute respiratory failure, unspecified whether with hypoxia or hypercapnia: Secondary | ICD-10-CM

## 2014-03-14 DIAGNOSIS — N189 Chronic kidney disease, unspecified: Secondary | ICD-10-CM | POA: Diagnosis present

## 2014-03-14 DIAGNOSIS — R1310 Dysphagia, unspecified: Secondary | ICD-10-CM | POA: Diagnosis present

## 2014-03-14 DIAGNOSIS — J9601 Acute respiratory failure with hypoxia: Secondary | ICD-10-CM | POA: Diagnosis present

## 2014-03-14 DIAGNOSIS — N179 Acute kidney failure, unspecified: Secondary | ICD-10-CM | POA: Diagnosis present

## 2014-03-14 DIAGNOSIS — J189 Pneumonia, unspecified organism: Secondary | ICD-10-CM

## 2014-03-14 LAB — BASIC METABOLIC PANEL
ANION GAP: 13 (ref 5–15)
BUN: 47 mg/dL — ABNORMAL HIGH (ref 6–23)
CO2: 21 mEq/L (ref 19–32)
Calcium: 8.8 mg/dL (ref 8.4–10.5)
Chloride: 105 mEq/L (ref 96–112)
Creatinine, Ser: 2.76 mg/dL — ABNORMAL HIGH (ref 0.50–1.35)
GFR calc non Af Amer: 20 mL/min — ABNORMAL LOW (ref >90)
GFR, EST AFRICAN AMERICAN: 23 mL/min — AB (ref >90)
Glucose, Bld: 138 mg/dL — ABNORMAL HIGH (ref 70–99)
POTASSIUM: 3.8 meq/L (ref 3.7–5.3)
SODIUM: 139 meq/L (ref 137–147)

## 2014-03-14 LAB — CULTURE, RESPIRATORY W GRAM STAIN: Culture: NORMAL

## 2014-03-14 LAB — URINALYSIS, ROUTINE W REFLEX MICROSCOPIC
BILIRUBIN URINE: NEGATIVE
Glucose, UA: NEGATIVE mg/dL
HGB URINE DIPSTICK: NEGATIVE
Ketones, ur: NEGATIVE mg/dL
Leukocytes, UA: NEGATIVE
Nitrite: NEGATIVE
Protein, ur: 30 mg/dL — AB
Specific Gravity, Urine: 1.013 (ref 1.005–1.030)
UROBILINOGEN UA: 0.2 mg/dL (ref 0.0–1.0)
pH: 5 (ref 5.0–8.0)

## 2014-03-14 LAB — CBC
HCT: 27.9 % — ABNORMAL LOW (ref 39.0–52.0)
HEMOGLOBIN: 9.6 g/dL — AB (ref 13.0–17.0)
MCH: 31.1 pg (ref 26.0–34.0)
MCHC: 34.4 g/dL (ref 30.0–36.0)
MCV: 90.3 fL (ref 78.0–100.0)
PLATELETS: 243 10*3/uL (ref 150–400)
RBC: 3.09 MIL/uL — ABNORMAL LOW (ref 4.22–5.81)
RDW: 15 % (ref 11.5–15.5)
WBC: 11.8 10*3/uL — ABNORMAL HIGH (ref 4.0–10.5)

## 2014-03-14 LAB — STREP PNEUMONIAE URINARY ANTIGEN: Strep Pneumo Urinary Antigen: NEGATIVE

## 2014-03-14 LAB — CULTURE, RESPIRATORY

## 2014-03-14 LAB — URINE MICROSCOPIC-ADD ON

## 2014-03-14 LAB — CREATININE, URINE, RANDOM: Creatinine, Urine: 95.09 mg/dL

## 2014-03-14 LAB — SODIUM, URINE, RANDOM: SODIUM UR: 49 meq/L

## 2014-03-14 NOTE — Care Management Note (Signed)
    Page 1 of 1   03/14/2014     5:48:57 PM CARE MANAGEMENT NOTE 03/14/2014  Patient:  [name], [name]   Account Number:  [id number]  Date Initiated:  03/14/2014  Documentation initiated by:  Dessa Phi  Subjective/Objective Assessment:   80 Y/O M ADMITTED W/IMPACTED ESOPHAGEAL FOREIGN BODY.     Action/Plan:   FROM HOME.   Anticipated DC Date:  03/17/2014   Anticipated DC Plan:  [place]  CM consult      Choice offered to / List presented to:             Status of service:  In process, will continue to follow Medicare Important Message given?   (If response is "NO", the following Medicare IM given date fields will be blank) Date Medicare IM given:   Medicare IM given by:   Date Additional Medicare IM given:   Additional Medicare IM given by:    Discharge Disposition:    Per UR Regulation:  Reviewed for med. necessity/level of care/duration of stay  If discussed at Long Length of Stay Meetings, dates discussed:    Comments:  03/14/14 Delorese Sellin RN,BSN NCM [phone] SPOKE TO PATIENT'S SON Casey JR TEL#[phone] [place] TO CONFIRM.

## 2014-03-14 NOTE — Evaluation (Addendum)
SLP Cancellation Note  Patient Details Name: PHU RECORD MRN: [id number] DOB: [birth year]   Cancelled treatment:      received order for swallow evaluation 03/13/14 pm, pt has undergone endoscopy with findings of food/debris in esophagus with ? Esophageal dysmotility vs achalasia per MD note.   Suspect esophageal issues primary source of dysphagia and pt has declined work up.  SLP spoke to RN re: this issue.     MD agreed with cancellation of SLP swallow evaluation order.  Thanks.    Janett Labella [place], [place] [facility] SLP [phone]

## 2014-03-14 NOTE — Progress Notes (Signed)
[facility] PROGRESS NOTE  KATHERINE SYME NFA:[id number] DOB: September 28, 1931 DOA: 03/11/2014 PCP: Leola Brazil, MD  Assessment/Plan: #1 acute respiratory failure with hypoxia Secondary to community acquired pneumonia versus aspiration pneumonia. Patient with some esophageal dysmotility noted per upper endoscopy. Patient however refused and speech therapy evaluation stated that his swallowing is being evaluated at the [facility] and the like to followup there. Clinical improvement. Blood cultures pending. Sputum cultures pending. Urine Legionella antigen negative. Urine pneumococcus antigen negative. Continue empiric IV vancomycin and IV Maxipime. Follow.  #2 community acquired pneumonia versus aspiration pneumonia Patient on upper endoscopy to have probable esophageal dysmotility however patient refusing speech therapy evaluation. Patient is currently afebrile. WBC is trending down. Urine Legionella antigen is negative. Urine pneumococcus antigen is negative. Continue empiric IV vancomycin IV Maxipime. Follow.  #3 acute on chronic kidney disease stage III Patient with worsening renal function. May be secondary to prerenal azotemia as patient's blood pressure is noted to be borderline. Check a urine sodium. Check a urine creatinine. Check a UA with cultures and sensitivities. Check a renal ultrasound. Place a Foley catheter. Will hold blood pressure medications. Continue hydration with IV fluid. If worsening renal function may need to consult with nephrology.  #4 anemia H&H stable.  #5 hypertension Will hold blood pressure medications. Continue hydration.   Code Status: Full Family Communication: Updated patient no family at bedside. Disposition Plan: Home when medically stable.   Consultants:  Gastroenterology: Dr. Paulita Fujita 03/11/2014  Procedures:  Chest x-ray 03/12/2014, 03/11/2014  Upper endoscopy 03/11/2014 per Dr. Paulita Fujita  Antibiotics:  IV Maxipime  03/12/2014  IV vancomycin 03/12/2014  IV azithromycin 03/11/2014>>> 03/12/2014  IV Rocephin 03/11/2014 >>>> 03/12/2014    HPI/Subjective: Patient tolerating current diet. Patient states his father is being evaluated in the [place] and is refusing speech therapy evaluation. Patient feels is improving. No complaints.  Objective: Filed Vitals:   03/14/14 1634  BP: 120/58  Pulse: 73  Temp:   Resp:     Intake/Output Summary (Last 24 hours) at 03/14/14 1732 Last data filed at 03/14/14 0930  Gross per 24 hour  Intake    360 ml  Output    100 ml  Net    260 ml   Filed Weights   03/11/14 1233 03/13/14 1612 03/14/14 0436  Weight: 92.1 kg (203 lb 0.7 oz) 93.2 kg (205 lb 7.5 oz) 92.2 kg (203 lb 4.2 oz)    Exam:   General:  NAD  Cardiovascular: RRR  Respiratory: Some coarse breath sounds. No wheezing.  Abdomen: Soft, nontender, nondistended, positive bowel sounds  Musculoskeletal: No clubbing cyanosis or edema  Data Reviewed: Basic Metabolic Panel:  Recent Labs Lab 03/11/14 0457 03/12/14 0527 03/13/14 0508 03/14/14 0035  NA 137 138 138 139  K 3.6* 3.9 3.4* 3.8  CL 100 103 102 105  CO2 22 18* 19 21  GLUCOSE 145* 138* 147* 138*  BUN 46* 39* 43* 47*  CREATININE 2.15* 2.07* 2.60* 2.76*  CALCIUM 9.2 9.1 8.8 8.8   Liver Function Tests: No results found for this basename: AST, ALT, ALKPHOS, BILITOT, PROT, ALBUMIN,  in the last 168 hours No results found for this basename: LIPASE, AMYLASE,  in the last 168 hours No results found for this basename: AMMONIA,  in the last 168 hours CBC:  Recent Labs Lab 03/11/14 0457 03/12/14 0527 03/13/14 0508 03/14/14 0035  WBC 7.8 13.4* 15.4* 11.8*  NEUTROABS 5.5  --   --   --   HGB 11.2* 11.7*  10.7* 9.6*  HCT 32.9* 36.3* 31.8* 27.9*  MCV 89.4 92.4 90.3 90.3  PLT 316 293 280 243   Cardiac Enzymes: No results found for this basename: CKTOTAL, CKMB, CKMBINDEX, TROPONINI,  in the last 168 hours BNP (last 3 results)  Recent  Labs  10/05/13 0258 10/23/13 1700 11/27/13 1518  PROBNP 1056.0* 1346.0* 740.1*   CBG:  Recent Labs Lab 03/11/14 2139 03/13/14 2306  GLUCAP 156* 133*    Recent Results (from the past 240 hour(s))  CULTURE, BLOOD (ROUTINE X 2)     Status: None   Collection Time    03/11/14  1:32 PM      Result Value Ref Range Status   Specimen Description BLOOD RIGHT HAND   Final   Special Requests BOTTLES DRAWN AEROBIC ONLY 4CC   Final   Culture  Setup Time     Final   Value: 03/11/2014 18:46     Performed at [facility]   Culture     Final   Value:        BLOOD CULTURE RECEIVED NO GROWTH TO DATE CULTURE WILL BE HELD FOR 5 DAYS BEFORE ISSUING A FINAL NEGATIVE REPORT     Performed at [facility]   Report Status PENDING   Incomplete  CULTURE, BLOOD (ROUTINE X 2)     Status: None   Collection Time    03/11/14  1:37 PM      Result Value Ref Range Status   Specimen Description BLOOD RIGHT ANTECUBITAL   Final   Special Requests BOTTLES DRAWN AEROBIC ONLY 4 CC    Final   Culture  Setup Time     Final   Value: 03/11/2014 18:46     Performed at [facility]   Culture     Final   Value:        BLOOD CULTURE RECEIVED NO GROWTH TO DATE CULTURE WILL BE HELD FOR 5 DAYS BEFORE ISSUING A FINAL NEGATIVE REPORT     Performed at [facility]   Report Status PENDING   Incomplete  CULTURE, EXPECTORATED SPUTUM-ASSESSMENT     Status: None   Collection Time    03/11/14  8:00 PM      Result Value Ref Range Status   Specimen Description SPUTUM   Final   Special Requests NONE   Final   Sputum evaluation     Final   Value: MICROSCOPIC FINDINGS SUGGEST THAT THIS SPECIMEN IS NOT REPRESENTATIVE OF LOWER RESPIRATORY SECRETIONS. PLEASE RECOLLECT.     CALLED REINERT,L/4E @2207  ON 03/11/14 BY KARCZEWSKI,S.   Report Status 03/11/2014 FINAL   Final  CULTURE, EXPECTORATED SPUTUM-ASSESSMENT     Status: None   Collection Time    03/12/14  5:18 AM      Result Value Ref Range Status    Specimen Description SPUTUM   Final   Special Requests Normal   Final   Sputum evaluation     Final   Value: THIS SPECIMEN IS ACCEPTABLE. RESPIRATORY CULTURE REPORT TO FOLLOW.   Report Status 03/12/2014 FINAL   Final  CULTURE, RESPIRATORY (NON-EXPECTORATED)     Status: None   Collection Time    03/12/14  5:18 AM      Result Value Ref Range Status   Specimen Description SPUTUM   Final   Special Requests NONE   Final   Gram Stain     Final   Value: RARE WBC PRESENT, PREDOMINANTLY MONONUCLEAR     FEW SQUAMOUS EPITHELIAL  CELLS PRESENT     RARE GRAM POSITIVE COCCI IN PAIRS     IN CLUSTERS RARE GRAM POSITIVE RODS     Performed at [facility]   Culture     Final   Value: NORMAL OROPHARYNGEAL FLORA     Performed at [facility]   Report Status 03/14/2014 FINAL   Final     Studies: No results found.  Scheduled Meds: . allopurinol  100 mg Oral Daily  . aspirin EC  81 mg Oral Daily  . carvedilol  12.5 mg Oral BID WC  . ceFEPime (MAXIPIME) IV  1 g Intravenous Q24H  . enoxaparin (LOVENOX) injection  30 mg Subcutaneous Q24H  . ferrous sulfate  325 mg Oral BID WC  . mirtazapine  7.5 mg Oral QHS  . niacin  1,000 mg Oral QHS  . pantoprazole  40 mg Oral Daily  . sertraline  200 mg Oral QHS  . simvastatin  10 mg Oral Daily  . sucralfate  1 g Oral TID WC & HS  . traZODone  50 mg Oral QHS  . vancomycin  1,000 mg Intravenous Q24H   Continuous Infusions:   Principal Problem:   Acute respiratory failure with hypoxia Active Problems:   HTN (hypertension)   Diabetes mellitus   Anemia   CKD (chronic kidney disease) stage 3, GFR 30-59 ml/min   Hypokalemia   Impacted esophageal foreign body   PNA (pneumonia)   Malnutrition of moderate degree   Odynophagia   Renal failure (ARF), acute on chronic    Time spent: 6 minutes    Laparis Durrett M.D. [facility] Pager [phone]. If 7PM-7AM, please contact night-coverage at [email or web address], password [facility] 03/14/2014,  5:32 PM  LOS: 3 days

## 2014-03-15 DIAGNOSIS — N183 Chronic kidney disease, stage 3 unspecified: Secondary | ICD-10-CM

## 2014-03-15 DIAGNOSIS — I509 Heart failure, unspecified: Secondary | ICD-10-CM

## 2014-03-15 DIAGNOSIS — I369 Nonrheumatic tricuspid valve disorder, unspecified: Secondary | ICD-10-CM

## 2014-03-15 DIAGNOSIS — I1310 Hypertensive heart and chronic kidney disease without heart failure, with stage 1 through stage 4 chronic kidney disease, or unspecified chronic kidney disease: Secondary | ICD-10-CM

## 2014-03-15 DIAGNOSIS — I10 Essential (primary) hypertension: Secondary | ICD-10-CM

## 2014-03-15 DIAGNOSIS — I5030 Unspecified diastolic (congestive) heart failure: Secondary | ICD-10-CM

## 2014-03-15 DIAGNOSIS — I5033 Acute on chronic diastolic (congestive) heart failure: Secondary | ICD-10-CM

## 2014-03-15 LAB — RENAL FUNCTION PANEL
ANION GAP: 15 (ref 5–15)
Albumin: 2.2 g/dL — ABNORMAL LOW (ref 3.5–5.2)
BUN: 51 mg/dL — ABNORMAL HIGH (ref 6–23)
CO2: 22 mEq/L (ref 19–32)
Calcium: 8.8 mg/dL (ref 8.4–10.5)
Chloride: 99 mEq/L (ref 96–112)
Creatinine, Ser: 2.7 mg/dL — ABNORMAL HIGH (ref 0.50–1.35)
GFR, EST AFRICAN AMERICAN: 24 mL/min — AB (ref >90)
GFR, EST NON AFRICAN AMERICAN: 21 mL/min — AB (ref >90)
Glucose, Bld: 119 mg/dL — ABNORMAL HIGH (ref 70–99)
Phosphorus: 2.4 mg/dL (ref 2.3–4.6)
Potassium: 3.6 mEq/L — ABNORMAL LOW (ref 3.7–5.3)
SODIUM: 136 meq/L — AB (ref 137–147)

## 2014-03-15 LAB — CBC
HCT: 29 % — ABNORMAL LOW (ref 39.0–52.0)
Hemoglobin: 9.8 g/dL — ABNORMAL LOW (ref 13.0–17.0)
MCH: 30.2 pg (ref 26.0–34.0)
MCHC: 33.8 g/dL (ref 30.0–36.0)
MCV: 89.5 fL (ref 78.0–100.0)
PLATELETS: 239 10*3/uL (ref 150–400)
RBC: 3.24 MIL/uL — AB (ref 4.22–5.81)
RDW: 14.8 % (ref 11.5–15.5)
WBC: 8.6 10*3/uL (ref 4.0–10.5)

## 2014-03-15 LAB — VANCOMYCIN, TROUGH: Vancomycin Tr: 20.7 ug/mL — ABNORMAL HIGH (ref 10.0–20.0)

## 2014-03-15 LAB — LEGIONELLA ANTIGEN, URINE: Legionella Antigen, Urine: NEGATIVE

## 2014-03-15 LAB — URINE CULTURE
Colony Count: NO GROWTH
Culture: NO GROWTH

## 2014-03-15 LAB — TSH: TSH: 1.8 u[IU]/mL (ref 0.350–4.500)

## 2014-03-15 MED ORDER — CARVEDILOL 6.25 MG PO TABS
6.2500 mg | ORAL_TABLET | Freq: Two times a day (BID) | ORAL | Status: DC
  Administered 2014-03-15 – 2014-03-16 (×2): 6.25 mg via ORAL
  Filled 2014-03-15 (×4): qty 1

## 2014-03-15 MED ORDER — PIPERACILLIN-TAZOBACTAM 3.375 G IVPB
3.3750 g | Freq: Three times a day (TID) | INTRAVENOUS | Status: DC
  Administered 2014-03-15 – 2014-03-16 (×2): 3.375 g via INTRAVENOUS
  Filled 2014-03-15 (×4): qty 50

## 2014-03-15 MED ORDER — HYDROCOD POLST-CHLORPHEN POLST 10-8 MG/5ML PO LQCR
5.0000 mL | Freq: Once | ORAL | Status: AC
  Administered 2014-03-15: 5 mL via ORAL
  Filled 2014-03-15: qty 5

## 2014-03-15 MED ORDER — POTASSIUM CHLORIDE CRYS ER 20 MEQ PO TBCR
20.0000 meq | EXTENDED_RELEASE_TABLET | Freq: Once | ORAL | Status: AC
  Administered 2014-03-15: 20 meq via ORAL
  Filled 2014-03-15: qty 1

## 2014-03-15 NOTE — Consult Note (Signed)
WOC wound consult note Reason for Consult:Bilateral Unna's Boots with ulcerations on the right LE Wound type:Venouis insufficiency Pressure Ulcer POA: No Measurement:Right medial LE partial thickness wound measuring 4cm x 5cm x 0.2cm.  Right lateral full thickness wound measuring 3cm x 3xcm x 0.2cm. Wound FIE:PPIRJ, pink, moist Drainage (amount, consistency, odor) scant serous Periwound:dry, flaking on right LE, clean, dry and supple on left LE Dressing procedure/placement/frequency: I will ask nursing to remove bilateral LE Unna's Boots and to wash and dry the LEs prior to moisturizing and placing a topical moisture retentive dressing over the right LE wounds. Following that, ortho Technicians will place bilateral Unna's Boots.  Changes are indicated for once weekly at this time.  Patient states that they have been changed three times a week at home previously.  There is no indication that this frequency is needed at this time. [place] nursing team will not follow, but will remain available to this patient, the nursing and medical team.  Please re-consult if needed. Thanks, Maudie Flakes, MSN, RN, [place], [place], [place] [phone])

## 2014-03-15 NOTE — Progress Notes (Signed)
  Echocardiogram 2D Echocardiogram has been performed.  Darlina Sicilian M 03/15/2014, 4:14 PM

## 2014-03-15 NOTE — Consult Note (Addendum)
Reason for Consult: Aflutter/CHF  Requesting Physician: Dr. Sheran Fava  HPI: [name] is an 80 year old male appearing African American male admitted on 03/11/14 with community-acquired pneumonia versus aspiration pneumonia. We are asked to see him because of newly recognized atrial flutter with controlled ventricular response. Prior EKGs have shown right bundle branch block with long first degree AV block. Previous echoes revealed preserved left ventricular function with diastolic dysfunction. He was admitted, diuresed and placed on antibiotics. He does have what appears to be venous stasis ulcers on his pretibial areas. This exam is otherwise fairly benign. His baseline serum creatinine is in the 2 range and this has increased to 2.7 with diuresis. His diuretics have been placed on hold. He was on torsemide and metolazone as an outpatient in addition to Coreg and BiDil.  Problem List: Patient Active Problem List   Diagnosis Date Noted  . Acute respiratory failure with hypoxia 03/14/2014  . Odynophagia 03/14/2014  . Renal failure (ARF), acute on chronic 03/14/2014  . Malnutrition of moderate degree 03/13/2014  . Impacted esophageal foreign body 03/11/2014  . PNA (pneumonia) 03/11/2014  . Paresthesias 10/23/2013  . TIA (transient ischemic attack) 10/23/2013  . First degree atrioventricular block 10/03/2013  . Acute on chronic congestive heart failure with right ventricular diastolic dysfunction 29/19/1660  . Chronic diastolic heart failure 60/12/5995  . Near syncope 04/23/2013  . Hypokalemia 04/23/2013  . Right heart failure 11/22/2012  . Cardiorenal syndrome with renal failure 11/22/2012  . Acute on chronic renal failure 11/22/2012  . CHF (congestive heart failure) 11/12/2012  . Kidney stone   . HTN (hypertension) 10/26/2010  . Diabetes mellitus 10/26/2010  . Anemia 10/26/2010  . Arthritis 10/26/2010  . Chronic back pain 10/26/2010  . Gout 10/26/2010  . CKD (chronic kidney  disease) stage 3, GFR 30-59 ml/min 10/26/2010  . Osteoarthritis 10/26/2010  . Dysphagia, unspecified 10/26/2010    PMHx:  Past Medical History  Diagnosis Date  . Pneumonia   . Hypertension   . Hypercholesteremia   . Diabetes mellitus   . Gout   . Irregular heart beat   . Kidney stone   . Lymphoma     s/p partial gastrectomy  . CHF (congestive heart failure)    Past Surgical History  Procedure Laterality Date  . Partial gastrectomy  1994    for lymphoma  . Hernia repair    . Joint replacement    . Back surgery    . Esophagogastroduodenoscopy N/A 10/28/2012    Procedure: ESOPHAGOGASTRODUODENOSCOPY (EGD);  Surgeon: Lear Ng, MD;  Location: [facility];  Service: Endoscopy;  Laterality: N/A;  . Esophagogastroduodenoscopy Left 03/11/2014    Procedure: ESOPHAGOGASTRODUODENOSCOPY (EGD);  Surgeon: Arta Silence, MD;  Location: [facility];  Service: Endoscopy;  Laterality: Left;    FAMHx: History reviewed. No pertinent family history.  SOCHx:  reports that he has never smoked. He has never used smokeless tobacco. He reports that he does not drink alcohol or use illicit drugs.  ALLERGIES: No Known Allergies  ROS: Pertinent items are noted in HPI.  HOME MEDICATIONS: Prescriptions prior to admission  Medication Sig Dispense Refill  . allopurinol (ZYLOPRIM) 100 MG tablet Take 100 mg by mouth daily.      Marland Kitchen aspirin EC 81 MG tablet Take 1 tablet (81 mg total) by mouth daily.  30 tablet  3  . carvedilol (COREG) 12.5 MG tablet Take 1 tablet (12.5 mg total) by mouth 2 (two) times daily with a meal.  60 tablet  3  . ferrous sulfate 325 (65 FE) MG tablet Take 325 mg by mouth 2 (two) times daily with a meal.      . gabapentin (NEURONTIN) 100 MG capsule Take 100 mg by mouth 2 (two) times daily.      Marland Kitchen glipiZIDE (GLUCOTROL) 10 MG tablet Take 5 mg by mouth daily.       . hydrALAZINE (APRESOLINE) 50 MG tablet Take 50 mg by mouth 3 (three) times daily.      Marland Kitchen  HYDROcodone-acetaminophen (NORCO/VICODIN) 5-325 MG per tablet Take 1-2 tablets by mouth every 4 (four) hours as needed for moderate pain.      . isosorbide dinitrate (ISORDIL) 20 MG tablet Take 20 mg by mouth 3 (three) times daily.      . metolazone (ZAROXOLYN) 2.5 MG tablet Take 2.5 mg by mouth every other day.      . mirtazapine (REMERON) 15 MG tablet Take 7.5 mg by mouth at bedtime.      . niacin 500 MG tablet Take 1,000 mg by mouth at bedtime.       Marland Kitchen omeprazole (PRILOSEC) 20 MG capsule Take 20 mg by mouth 2 (two) times daily before a meal.      . psyllium (METAMUCIL) 58.6 % powder Take 1 packet by mouth 3 (three) times daily.      Marland Kitchen senna-docusate (SENOKOT-S) 8.6-50 MG per tablet Take 2 tablets by mouth 2 (two) times daily as needed for mild constipation.      . sertraline (ZOLOFT) 100 MG tablet Take 200 mg by mouth at bedtime.       . simvastatin (ZOCOR) 20 MG tablet Take 10 mg by mouth daily.      Marland Kitchen torsemide (DEMADEX) 20 MG tablet Take 20 mg by mouth 2 (two) times daily.      . traZODone (DESYREL) 50 MG tablet Take 50 mg by mouth at bedtime.      . carboxymethylcellulose 1 % ophthalmic solution Apply 1 drop to eye 4 (four) times daily.        HOSPITAL MEDICATIONS: I have reviewed the patient's current medications.  VITALS: Blood pressure 103/56, pulse 69, temperature 97.6 F (36.4 C), temperature source Oral, resp. rate 18, height 6\' 3"  (1.905 m), weight 202 lb 6.1 oz (91.8 kg), SpO2 97.00%.  INPUT/OUTPUT I/O last 3 completed shifts: In: 1050 [P.O.:600; IV Piggyback:450] Out: 975 [Urine:975] Total I/O In: 260 [P.O.:260] Out: 225 [Urine:225]    PHYSICAL EXAM: General appearance: alert and no distress Neck: no adenopathy, no carotid bruit, no JVD, supple, symmetrical, trachea midline and thyroid not enlarged, symmetric, no tenderness/mass/nodules Lungs: clear to auscultation bilaterally Heart: irregularly irregular rhythm Extremities: vvenous stasis ulcer ulcer right  pretibial area, mild edema both feet  LABS:  BMP  Recent Labs  03/13/14 0508 03/14/14 0035 03/15/14 0440  NA 138 139 136*  K 3.4* 3.8 3.6*  CL 102 105 99  CO2 19 21 22   GLUCOSE 147* 138* 119*  BUN 43* 47* 51*  CREATININE 2.60* 2.76* 2.70*  CALCIUM 8.8 8.8 8.8  GFRNONAA 22* 20* 21*  GFRAA 25* 23* 24*    CBC  Recent Labs Lab 03/15/14 0440  WBC 8.6  RBC 3.24*  HGB 9.8*  HCT 29.0*  PLT 239  MCV 89.5    HEMOGLOBIN A1C Lab Results  Component Value Date   HGBA1C 6.4* 10/24/2013   MPG 137* 10/24/2013    Cardiac Panel (last 3 results)  Recent Labs  10/01/13 1425 10/01/13 2030 10/23/13 1700  TROPONINI <0.30 <0.30 <0.30    BNP (last 3 results)  Recent Labs  10/05/13 0258 10/23/13 1700 11/27/13 1518  PROBNP 1056.0* 1346.0* 740.1*    TSH  Recent Labs  04/23/13 2022 10/03/13 0505  TSH 1.532 3.032    CHOLESTEROL  Recent Labs  10/24/13 0553  CHOL 109    Hepatic Function Panel  Recent Labs  10/23/13 1700 10/24/13 0730 11/27/13 1547 03/15/14 0440  PROT 7.5 7.2 6.7  --   ALBUMIN 3.2* 3.0* 3.0* 2.2*  AST 33 33 44*  --   ALT 22 21 33  --   ALKPHOS 154* 158* 135*  --   BILITOT 0.2* 0.3 0.2*  --     IMAGING: US Renal  03/14/2014   CLINICAL DATA:  Acute renal failure.  Diabetes and hypertension.  EXAM: RENAL/URINARY TRACT ULTRASOUND COMPLETE  COMPARISON:  CT abdomen pelvis without contrast 10/23/2013. Abdominal ultrasound 11/22/2012  FINDINGS: Right Kidney:  Imaging is technically difficult due to bowel gas and body habitus.  Length: Measures approximately 15 cm in length, including renal cysts. Echogenicity appears slightly increased. There is no hydronephrosis. Again seen are 2 cysts in the upper to mid pole. The largest is simple measures 5.1 x 4.8 x 4.7 cm. More superiorly is a smaller cyst with some internal echoes that measures 2.1 x 2.5 x 3.0 cm. The internal echoes are consistent with mild complexity, and this cyst has a similar  appearance to the ultrasound of 11/22/2012.  Left Kidney:  Length: Measures approximately 14.2 cm in length, including the renal cysts. No evidence of hydronephrosis. Echogenicity appears within normal limits, but evaluation is difficult due to limitation in image detail. There are multiple cysts involving the upper to mid pole. The largest cyst is 5.5 x 4.8 x 5.6 cm in the upper pole.  Bladder:  Appears normal for degree of bladder distention. A right ureteral jet is visualized. A left ureteral jet is not definitely seen.  IMPRESSION: 1. Image detail of the kidneys is limited by body habitus and bowel gas. Again seen are bilateral renal cysts, with a stable mildly complex cyst in the upper pole of the right kidney. 2. No evidence of hydronephrosis.   Electronically Signed   By: Curlene Dolphin M.D.   On: 03/14/2014 20:43    IMPRESSION: 1. Atrial flutter-prior EKGs have shown sinus rhythm with first degree AV block and right branch block. EKG on admission showed atrial flutter with variable block, controlled ventricular response with right bundle branch block. His CHA2DSVASC score is 5 suggesting that he would benefit from oral anticoagulation although I'm not sure he is the best candidate for this. Given his current ventricular response I do not see the need to add any further rate slowing agents other than his Coreg. 2. Congestive heart failure/diastolic dysfunction- in the past he has had documentation of preserved left ventricular function with diastolic dysfunction. He's had right heart failure in the past requiring high-dose diuretics including torsemide 20 mg by mouth twice a day and metolazone as an outpatient. On exam he appears fairly dry and his serum creatinine has increased from 1.9-2.7 since admission I agree withholding his diuretics, liberalizing his fluids and follow his renal function. He should be placed back on her diuretic prior to discharge. A 2-D echocardiogram is  pending.   RECOMMENDATION: 1. Despite his need for oral anticoagulation given his high CHA2DSVASC score I am not convinced he is the best oral anticoagulation candidate based on potential compliance issues. At this  point, given the fact that he is rate controlled I would continue his carvedilol and treat him with aspirin therapy.it appears that he has had an episode of TIA back in February of this year. 2. Hold diuretics as you were doing, follow renal function and restart oral diuretics prior to discharge. He would benefit from followup in our congestive heart failure clinic.  Time Spent Directly with Patient: 45 minutes  Veer Elamin J 03/15/2014, 12:54 PM

## 2014-03-15 NOTE — Progress Notes (Signed)
[facility] PROGRESS NOTE  [name] RSW:[id number] DOB: 07/15/1932 DOA: 03/11/2014 PCP: Leola Brazil, MD  Assessment/Plan  Acute respiratory failure with hypoxia due to CAP vs. Aspiration pneumonia, now on room air - Esophageal dysmotility noted per upper endoscopy. Patient however refused and speech therapy evaluation. - Blood cultures NGTD - Sputum cultures normal OP flora -  Urine Legionella antigen and S. pneumo negative -  D/c IV vancomycin since cx negative -  Change cefepime to IV zosyn for better anaerobic coverage given suspicion of aspiration -  Plan to transition to augmentin   Acute on chronic kidney disease stage III, creatinine relatively stable last few days, but up from baseline of 1.5 -2. -  FENa 1% -  RUS without obstruction -  Not retaining urine -  Okay to d/c foley -  Continue to hold BP medications -  F/u with nephrology   Normocytic anemia, likely due to renal disease, hgb stable.  Hypertension, blood pressures low normal -  Will hold blood pressure medications. Continue hydration.  Congestive heart failure, previously required diuresis with milrinone during inpatient admissions.  Last EF preserved.  Persistent LEE with una boots in place -  Diuretics currently on hold due to AKI -  Appreciate cardiology assistance -  Daily weights -  Strict I/O -  Wound care consult for una boot   Previously had NSR with 1st degree block, however, ECG from admission with atrial flutter and variable PR intervals concerning for developing higher degree heart block.  Patient bradycardic to 40s on telemetry -  decrease carvedilol to 6.25mg  BID -  Cardiology consult -  Repeat ECHO -  TSH -  Troponin may be slightly high due to CKD -  Defer a/c to cardiology   Diet:  Soft, low sodium Access:  PIV IVF:  off Proph:  lovenox  Code Status: full Family Communication: patient alone Disposition Plan: pending work up for heart rhythm problem,     Consultants:  Cardiology  Nephrology  GI, Dr. Paulita Fujita   Procedures:  Chest x-ray 03/12/2014, 03/11/2014  Upper endoscopy 03/11/2014 per Dr. Paulita Fujita Antibiotics:  IV Maxipime 03/12/2014 > 7/15 IV vancomycin 03/12/2014  IV azithromycin 03/11/2014>>> 03/12/2014  IV Rocephin 03/11/2014 >>>> 03/12/2014  Zosyn 7/15 >>   HPI/Subjective:  Breathing okay, but c/o bilateral leg pain  Objective: Filed Vitals:   03/14/14 1400 03/14/14 1634 03/14/14 2154 03/15/14 0515  BP: 112/59 120/58 112/56 103/56  Pulse: 76 73 71 69  Temp: 98 F (36.7 C)  97.8 F (36.6 C) 97.6 F (36.4 C)  TempSrc: Oral  Oral Oral  Resp: 18  18 18   Height:      Weight:    91.8 kg (202 lb 6.1 oz)  SpO2: 97%  98% 97%    Intake/Output Summary (Last 24 hours) at 03/15/14 1131 Last data filed at 03/15/14 0940  Gross per 24 hour  Intake   1140 ml  Output   1100 ml  Net     40 ml   Filed Weights   03/13/14 1612 03/14/14 0436 03/15/14 0515  Weight: 93.2 kg (205 lb 7.5 oz) 92.2 kg (203 lb 4.2 oz) 91.8 kg (202 lb 6.1 oz)    Exam:   General:  BM, No acute distress  HEENT:  NCAT, MMM  Cardiovascular:  IRRR, nl S1, S2 no mrg, 2+ pulses, warm extremities  Respiratory:  Rhonchorous, no focal rales or wheeze no increased WOB  Abdomen:   NABS, soft, mildly distended, TTP diffusely  wtihout rebound ro guarding, bilateral hernias  MSK:   Normal tone and bulk, dried skin on right leg, 1+ bilateral LEE  Neuro:  Grossly moves all extremities.    Data Reviewed: Basic Metabolic Panel:  Recent Labs Lab 03/11/14 0457 03/12/14 0527 03/13/14 0508 03/14/14 0035 03/15/14 0440  NA 137 138 138 139 136*  K 3.6* 3.9 3.4* 3.8 3.6*  CL 100 103 102 105 99  CO2 22 18* 19 21 22   GLUCOSE 145* 138* 147* 138* 119*  BUN 46* 39* 43* 47* 51*  CREATININE 2.15* 2.07* 2.60* 2.76* 2.70*  CALCIUM 9.2 9.1 8.8 8.8 8.8  PHOS  --   --   --   --  2.4   Liver Function Tests:  Recent Labs Lab 03/15/14 0440  ALBUMIN 2.2*    No results found for this basename: LIPASE, AMYLASE,  in the last 168 hours No results found for this basename: AMMONIA,  in the last 168 hours CBC:  Recent Labs Lab 03/11/14 0457 03/12/14 0527 03/13/14 0508 03/14/14 0035 03/15/14 0440  WBC 7.8 13.4* 15.4* 11.8* 8.6  NEUTROABS 5.5  --   --   --   --   HGB 11.2* 11.7* 10.7* 9.6* 9.8*  HCT 32.9* 36.3* 31.8* 27.9* 29.0*  MCV 89.4 92.4 90.3 90.3 89.5  PLT 316 293 280 243 239   Cardiac Enzymes: No results found for this basename: CKTOTAL, CKMB, CKMBINDEX, TROPONINI,  in the last 168 hours BNP (last 3 results)  Recent Labs  10/05/13 0258 10/23/13 1700 11/27/13 1518  PROBNP 1056.0* 1346.0* 740.1*   CBG:  Recent Labs Lab 03/11/14 2139 03/13/14 2306  GLUCAP 156* 133*    Recent Results (from the past 240 hour(s))  CULTURE, BLOOD (ROUTINE X 2)     Status: None   Collection Time    03/11/14  1:32 PM      Result Value Ref Range Status   Specimen Description BLOOD RIGHT HAND   Final   Special Requests BOTTLES DRAWN AEROBIC ONLY 4CC   Final   Culture  Setup Time     Final   Value: 03/11/2014 18:46     Performed at [facility]   Culture     Final   Value:        BLOOD CULTURE RECEIVED NO GROWTH TO DATE CULTURE WILL BE HELD FOR 5 DAYS BEFORE ISSUING A FINAL NEGATIVE REPORT     Performed at [facility]   Report Status PENDING   Incomplete  CULTURE, BLOOD (ROUTINE X 2)     Status: None   Collection Time    03/11/14  1:37 PM      Result Value Ref Range Status   Specimen Description BLOOD RIGHT ANTECUBITAL   Final   Special Requests BOTTLES DRAWN AEROBIC ONLY 4 CC    Final   Culture  Setup Time     Final   Value: 03/11/2014 18:46     Performed at [facility]   Culture     Final   Value:        BLOOD CULTURE RECEIVED NO GROWTH TO DATE CULTURE WILL BE HELD FOR 5 DAYS BEFORE ISSUING A FINAL NEGATIVE REPORT     Performed at [facility]   Report Status PENDING   Incomplete  CULTURE,  EXPECTORATED SPUTUM-ASSESSMENT     Status: None   Collection Time    03/11/14  8:00 PM      Result Value Ref Range Status  Specimen Description SPUTUM   Final   Special Requests NONE   Final   Sputum evaluation     Final   Value: MICROSCOPIC FINDINGS SUGGEST THAT THIS SPECIMEN IS NOT REPRESENTATIVE OF LOWER RESPIRATORY SECRETIONS. PLEASE RECOLLECT.     CALLED REINERT,L/4E @2207  ON 03/11/14 BY KARCZEWSKI,S.   Report Status 03/11/2014 FINAL   Final  CULTURE, EXPECTORATED SPUTUM-ASSESSMENT     Status: None   Collection Time    03/12/14  5:18 AM      Result Value Ref Range Status   Specimen Description SPUTUM   Final   Special Requests Normal   Final   Sputum evaluation     Final   Value: THIS SPECIMEN IS ACCEPTABLE. RESPIRATORY CULTURE REPORT TO FOLLOW.   Report Status 03/12/2014 FINAL   Final  CULTURE, RESPIRATORY (NON-EXPECTORATED)     Status: None   Collection Time    03/12/14  5:18 AM      Result Value Ref Range Status   Specimen Description SPUTUM   Final   Special Requests NONE   Final   Gram Stain     Final   Value: RARE WBC PRESENT, PREDOMINANTLY MONONUCLEAR     FEW SQUAMOUS EPITHELIAL CELLS PRESENT     RARE GRAM POSITIVE COCCI IN PAIRS     IN CLUSTERS RARE GRAM POSITIVE RODS     Performed at [facility]   Culture     Final   Value: NORMAL OROPHARYNGEAL FLORA     Performed at [facility]   Report Status 03/14/2014 FINAL   Final     Studies: US Renal  03/14/2014   CLINICAL DATA:  Acute renal failure.  Diabetes and hypertension.  EXAM: RENAL/URINARY TRACT ULTRASOUND COMPLETE  COMPARISON:  CT abdomen pelvis without contrast 10/23/2013. Abdominal ultrasound 11/22/2012  FINDINGS: Right Kidney:  Imaging is technically difficult due to bowel gas and body habitus.  Length: Measures approximately 15 cm in length, including renal cysts. Echogenicity appears slightly increased. There is no hydronephrosis. Again seen are 2 cysts in the upper to mid pole. The largest  is simple measures 5.1 x 4.8 x 4.7 cm. More superiorly is a smaller cyst with some internal echoes that measures 2.1 x 2.5 x 3.0 cm. The internal echoes are consistent with mild complexity, and this cyst has a similar appearance to the ultrasound of 11/22/2012.  Left Kidney:  Length: Measures approximately 14.2 cm in length, including the renal cysts. No evidence of hydronephrosis. Echogenicity appears within normal limits, but evaluation is difficult due to limitation in image detail. There are multiple cysts involving the upper to mid pole. The largest cyst is 5.5 x 4.8 x 5.6 cm in the upper pole.  Bladder:  Appears normal for degree of bladder distention. A right ureteral jet is visualized. A left ureteral jet is not definitely seen.  IMPRESSION: 1. Image detail of the kidneys is limited by body habitus and bowel gas. Again seen are bilateral renal cysts, with a stable mildly complex cyst in the upper pole of the right kidney. 2. No evidence of hydronephrosis.   Electronically Signed   By: Curlene Dolphin M.D.   On: 03/14/2014 20:43    Scheduled Meds: . allopurinol  100 mg Oral Daily  . aspirin EC  81 mg Oral Daily  . carvedilol  12.5 mg Oral BID WC  . ceFEPime (MAXIPIME) IV  1 g Intravenous Q24H  . enoxaparin (LOVENOX) injection  30 mg Subcutaneous Q24H  . ferrous sulfate  325 mg Oral BID WC  . mirtazapine  7.5 mg Oral QHS  . niacin  1,000 mg Oral QHS  . pantoprazole  40 mg Oral Daily  . sertraline  200 mg Oral QHS  . simvastatin  10 mg Oral Daily  . sucralfate  1 g Oral TID WC & HS  . traZODone  50 mg Oral QHS  . vancomycin  1,000 mg Intravenous Q24H   Continuous Infusions:   Principal Problem:   Acute respiratory failure with hypoxia Active Problems:   HTN (hypertension)   Diabetes mellitus   Anemia   CKD (chronic kidney disease) stage 3, GFR 30-59 ml/min   Hypokalemia   Impacted esophageal foreign body   PNA (pneumonia)   Malnutrition of moderate degree   Odynophagia   Renal  failure (ARF), acute on chronic    Time spent: 30 min    Gerrit Rafalski, [facility] Pager [phone]. If 7PM-7AM, please contact night-coverage at [email or web address], password [facility] 03/15/2014, 11:31 AM  LOS: 4 days

## 2014-03-16 DIAGNOSIS — I4892 Unspecified atrial flutter: Secondary | ICD-10-CM

## 2014-03-16 DIAGNOSIS — K220 Achalasia of cardia: Secondary | ICD-10-CM

## 2014-03-16 DIAGNOSIS — N179 Acute kidney failure, unspecified: Secondary | ICD-10-CM

## 2014-03-16 DIAGNOSIS — I440 Atrioventricular block, first degree: Secondary | ICD-10-CM

## 2014-03-16 DIAGNOSIS — I5032 Chronic diastolic (congestive) heart failure: Secondary | ICD-10-CM

## 2014-03-16 LAB — BASIC METABOLIC PANEL
ANION GAP: 12 (ref 5–15)
BUN: 43 mg/dL — ABNORMAL HIGH (ref 6–23)
CHLORIDE: 100 meq/L (ref 96–112)
CO2: 22 mEq/L (ref 19–32)
CREATININE: 2.17 mg/dL — AB (ref 0.50–1.35)
Calcium: 9.1 mg/dL (ref 8.4–10.5)
GFR calc non Af Amer: 27 mL/min — ABNORMAL LOW (ref >90)
GFR, EST AFRICAN AMERICAN: 31 mL/min — AB (ref >90)
Glucose, Bld: 156 mg/dL — ABNORMAL HIGH (ref 70–99)
POTASSIUM: 3.4 meq/L — AB (ref 3.7–5.3)
Sodium: 134 mEq/L — ABNORMAL LOW (ref 137–147)

## 2014-03-16 LAB — CBC
HCT: 31.6 % — ABNORMAL LOW (ref 39.0–52.0)
HEMOGLOBIN: 10.7 g/dL — AB (ref 13.0–17.0)
MCH: 30.5 pg (ref 26.0–34.0)
MCHC: 33.9 g/dL (ref 30.0–36.0)
MCV: 90 fL (ref 78.0–100.0)
Platelets: 229 10*3/uL (ref 150–400)
RBC: 3.51 MIL/uL — ABNORMAL LOW (ref 4.22–5.81)
RDW: 15 % (ref 11.5–15.5)
WBC: 8.6 10*3/uL (ref 4.0–10.5)

## 2014-03-16 MED ORDER — SUCRALFATE 1 G PO TABS: 1.0000 g | ORAL_TABLET | Freq: Three times a day (TID) | ORAL | Status: DC

## 2014-03-16 MED ORDER — AMOXICILLIN-POT CLAVULANATE 250-62.5 MG/5ML PO SUSR
500.0000 mg | Freq: Two times a day (BID) | ORAL | Status: DC
Start: 2014-03-16 — End: 2014-04-13

## 2014-03-16 MED ORDER — CARVEDILOL 6.25 MG PO TABS: 6.2500 mg | ORAL_TABLET | Freq: Two times a day (BID) | ORAL | Status: DC

## 2014-03-16 MED ORDER — POTASSIUM CHLORIDE CRYS ER 20 MEQ PO TBCR
40.0000 meq | EXTENDED_RELEASE_TABLET | Freq: Once | ORAL | Status: AC
  Administered 2014-03-16: 40 meq via ORAL
  Filled 2014-03-16: qty 2

## 2014-03-16 NOTE — Progress Notes (Signed)
Patient Name: [name] Date of Encounter: 03/16/2014  Principal Problem:   Acute respiratory failure with hypoxia Active Problems:   HTN (hypertension)   Diabetes mellitus   Anemia   CKD (chronic kidney disease) stage 3, GFR 30-59 ml/min   Hypokalemia   Impacted esophageal foreign body   PNA (pneumonia)   Malnutrition of moderate degree   Odynophagia   Renal failure (ARF), acute on chronic   Length of Stay: 5  SUBJECTIVE  Clinically has a regular rhythm at 60 bpm. Suspect it is an accelerated junctional rhythm, rather than NSR or atrial flutter -  No clear P waves on monitor, will check ECG. No complaints except on of his pills was too big and hard to swallow. Substantial improvement in renal function, just about back to his baseline.  CURRENT MEDS . allopurinol  100 mg Oral Daily  . aspirin EC  81 mg Oral Daily  . carvedilol  6.25 mg Oral BID WC  . enoxaparin (LOVENOX) injection  30 mg Subcutaneous Q24H  . ferrous sulfate  325 mg Oral BID WC  . mirtazapine  7.5 mg Oral QHS  . pantoprazole  40 mg Oral Daily  . piperacillin-tazobactam (ZOSYN)  IV  3.375 g Intravenous 3 times per day  . potassium chloride  40 mEq Oral Once  . sertraline  200 mg Oral QHS  . simvastatin  10 mg Oral Daily  . sucralfate  1 g Oral TID WC & HS  . traZODone  50 mg Oral QHS    OBJECTIVE   Intake/Output Summary (Last 24 hours) at 03/16/14 0835 Last data filed at 03/15/14 2300  Gross per 24 hour  Intake    740 ml  Output    575 ml  Net    165 ml   Filed Weights   03/14/14 0436 03/15/14 0515 03/16/14 0500  Weight: 203 lb 4.2 oz (92.2 kg) 202 lb 6.1 oz (91.8 kg) 200 lb 9.9 oz (91 kg)    PHYSICAL EXAM Filed Vitals:   03/15/14 0515 03/15/14 1349 03/15/14 2121 03/16/14 0500  BP: 103/56 126/65 114/63   Pulse: 69 57 71   Temp: 97.6 F (36.4 C) 97.5 F (36.4 C) 97.7 F (36.5 C)   TempSrc: Oral Oral Oral   Resp: 18 16 19    Height:      Weight: 202 lb 6.1 oz (91.8 kg)   200 lb  9.9 oz (91 kg)  SpO2: 97% 100% 100%    General: Alert, oriented x3, no distress Head: no evidence of trauma, PERRL, EOMI, no exophtalmos or lid lag, no myxedema, no xanthelasma; normal ears, nose and oropharynx Neck: normal jugular venous pulsations and no hepatojugular reflux; brisk carotid pulses without delay and no carotid bruits Chest: clear to auscultation, no signs of consolidation by percussion or palpation, normal fremitus, symmetrical and full respiratory excursions Cardiovascular: normal position and quality of the apical impulse, regular rhythm, normal first and second heart sounds, loud S4 and widely split S2, no murmur Abdomen: no tenderness or distention, no masses by palpation, no abnormal pulsatility or arterial bruits, normal bowel sounds, no hepatosplenomegaly Extremities: no clubbing, cyanosis or edema; 2+ radial, ulnar and brachial pulses bilaterally; 2+ right femoral, posterior tibial and dorsalis pedis pulses; 2+ left femoral, posterior tibial and dorsalis pedis pulses; no subclavian or femoral bruits Neurological: grossly nonfocal  LABS  CBC  Recent Labs  03/15/14 0440 03/16/14 0516  WBC 8.6 8.6  HGB 9.8* 10.7*  HCT 29.0* 31.6*  MCV  89.5 90.0  PLT 239 115   Basic Metabolic Panel  Recent Labs  03/14/14 0035 03/15/14 0440 03/16/14 0516  NA 139 136* 134*  K 3.8 3.6* 3.4*  CL 105 99 100  CO2 21 22 22   GLUCOSE 138* 119* 156*  BUN 47* 51* 43*  CREATININE 2.76* 2.70* 2.17*  CALCIUM 8.8 8.8 9.1  PHOS  --  2.4  --    Liver Function Tests  Recent Labs  03/15/14 0440  ALBUMIN 2.2*  Thyroid Function Tests  Recent Labs  03/15/14 1124  TSH 1.800    Radiology Studies Imaging results have been reviewed and US Renal  03/14/2014   CLINICAL DATA:  Acute renal failure.  Diabetes and hypertension.  EXAM: RENAL/URINARY TRACT ULTRASOUND COMPLETE  COMPARISON:  CT abdomen pelvis without contrast 10/23/2013. Abdominal ultrasound 11/22/2012  FINDINGS: Right  Kidney:  Imaging is technically difficult due to bowel gas and body habitus.  Length: Measures approximately 15 cm in length, including renal cysts. Echogenicity appears slightly increased. There is no hydronephrosis. Again seen are 2 cysts in the upper to mid pole. The largest is simple measures 5.1 x 4.8 x 4.7 cm. More superiorly is a smaller cyst with some internal echoes that measures 2.1 x 2.5 x 3.0 cm. The internal echoes are consistent with mild complexity, and this cyst has a similar appearance to the ultrasound of 11/22/2012.  Left Kidney:  Length: Measures approximately 14.2 cm in length, including the renal cysts. No evidence of hydronephrosis. Echogenicity appears within normal limits, but evaluation is difficult due to limitation in image detail. There are multiple cysts involving the upper to mid pole. The largest cyst is 5.5 x 4.8 x 5.6 cm in the upper pole.  Bladder:  Appears normal for degree of bladder distention. A right ureteral jet is visualized. A left ureteral jet is not definitely seen.  IMPRESSION: 1. Image detail of the kidneys is limited by body habitus and bowel gas. Again seen are bilateral renal cysts, with a stable mildly complex cyst in the upper pole of the right kidney. 2. No evidence of hydronephrosis.   Electronically Signed   By: Curlene Dolphin M.D.   On: 03/14/2014 20:43    TELE Regular rhythm at 60 bpm, probably accelerated junctional rhythm, but could be atrial flutter with 4:1 AV block still.  ECG A flutter with4:1 AV block  ASSESSMENT AND PLAN   Continue current beta blocker dose - would not increase further as he has evidence of significant conduction system disease. ASA for stroke prevention. He is not a good candidate for NOAC due to renal function and warfarin would be risky due to compliance issues. If he is compliant with follow up,, I would reconsider warfarin anticoagulation, which would provide superior CVA protection. Recommend gradually reintroducing  his diuretics.   Sanda Klein, MD, [facility] [phone] office [phone] pager 03/16/2014 8:35 AM

## 2014-03-16 NOTE — Discharge Summary (Signed)
Physician Discharge Summary  [name]:[id number] DOB: 09/14/1931 DOA: 03/11/2014  PCP: Leola Brazil, MD  Admit date: 03/11/2014 Discharge date: 03/16/2014  Recommendations for Outpatient Follow-up:  1. Repeat BMP in 1 week to check potassium and kidney function 2. F/u pending blood cultures 3. Daily weights and if he gains more than 3-lbs in 1 day or 5-lbs in 1 week, call cardiology office 4. Cardiology follow up in 2 weeks.  Consider resuming additional diuretics if needed.   5. Resume speech therapy 6. Resume imdur/hydralazine, metolazone 7. Stopped his glipizide for now due to risk of hypoglycemia and A1c has consistently been below goal of 7-7.5 for this patient.  Repeat A1c in 2-3 months and if rising, consider resuming.    Discharge Diagnoses:  Principal Problem:   Acute respiratory failure with hypoxia Active Problems:   HTN (hypertension)   Diabetes mellitus   Anemia   CKD (chronic kidney disease) stage 3, GFR 30-59 ml/min   Hypokalemia   Impacted esophageal foreign body   Aspiration pneumonia   Malnutrition of moderate degree   Odynophagia   Renal failure (ARF), acute on chronic   possible achalasia   AKI (acute kidney injury)   Discharge Condition: Stable, improved  Diet recommendation: Soft with thin liquids, low sodium  Wt Readings from Last 3 Encounters:  03/16/14 91 kg (200 lb 9.9 oz)  03/16/14 91 kg (200 lb 9.9 oz)  02/27/14 96.219 kg (212 lb 2 oz)    History of present illness:  Pt is 80 yo male who presented to [facility] ED with main concern of progressively worsening difficulty with swallowing and feeling as if the pills are getting stuck. He denies similar events in the past, no fevers, chills, no chest pain, no shortness of breath. Pt denies recent sick contacts or exposures. In ED, worrisome for PNA, TRH asked to admit for further evaluation and GI consulted.   Hospital Course:   Acute hypoxic respiratory failure secondary to  community-acquired pneumonia versus aspiration pneumonia.  He was initially started on v ceftriaxone and azithromycin, however, the following day he was escalated to treatment for healthcare associated pneumonia because he had not made rapid improvement. He was changed to vancomycin and cefepime. His cefepime was eventually changed to Zosyn for better coverage of anaerobes due to aspiration.  His blood cultures are no growth to date. His sputum culture demonstrated normal oral flora.  Urine Legionella and strep pneumo antigens were negative. He is being transitioned to Augmentin for ongoing treatment of aspiration pneumonia.  Dysphagia with odynophasia and foreign body sensation.  Gastroenterology with consulted and he underwent EGD on 7/11. He was found to have esophageal dysmotility, perhaps achalasia, which was felt to increase his risk for aspiration. He did not have esophageal foreign body. He was advised to continue sucralfate. Gastroenterology recommended that he work with speech therapy, however he declined.  He has speech therapy and other services available through the [place], and he wants to return there for ongoing management of his symptoms.    Acute on chronic kidney disease, stage III. His creatinine baseline is between 1.52 and increased to around 2.7-2.8. His fractional excretion of sodium is 1%, his renal ultrasound was without obstruction. He was not retaining urine. His diuretics were held and his creatinine trended down to around 2.1. His diuretics to be gradually reintroduced as his blood pressure, kidney function tolerated. He was restarted on torsemide at the time of discharge.  Hypertension, blood pressures were low normal, possibly  due to dehydration. His blood pressure medications were held, and his diuretics were discontinued. He may restart torsemide.  Congestive heart failure, previously requiring diuresis with milrinone during inpatient admissions. His last ejection fraction was  preserved. He has persistent lower extremity edema with Unna boots in place. He was seen by wound care for his lower extremity edema in his boots were replaced. As above, his diuretics were temporarily discontinued secondary to acute kidney injury, but a stress night can be restarted. He should have her repeat BMP done in one week and if his potassium and kidney function appear stable, and his weight appears to be rising, recommend restarting metolazone.  New onset atrial flutter with variable PR intervals, variable conduction.  He had episodes of bradycardia to the 40s. He was seen by cardiology.  His carvedilol was decreased to 6.25 mg twice a day. His TSH was within normal limits.  Repeat echocardiogram demonstrated preserved systolic function with ejection fraction of 50-55%. There were no wall motion abnormalities. He had moderate tricuspid valve regurgitation. His peak PA pressure was 45 mm of mercury, mildly to moderately increased.    Normocytic anemia, likely secondary to renal disease, and his hemoglobin remained stable.   Consultants:  Cardiology  Nephrology  GI, Dr. Paulita Fujita  Procedures:  Chest x-ray 03/12/2014, 03/11/2014  Upper endoscopy 03/11/2014 per Dr. Paulita Fujita Antibiotics:  IV Maxipime 03/12/2014 > 7/15  IV vancomycin 03/12/2014  IV azithromycin 03/11/2014>>> 03/12/2014  IV Rocephin 03/11/2014 >>>> 03/12/2014  Zosyn 7/15 >>  Discharge Exam: Filed Vitals:   03/15/14 2121  BP: 114/63  Pulse: 71  Temp: 97.7 F (36.5 C)  Resp: 19   Filed Vitals:   03/15/14 0515 03/15/14 1349 03/15/14 2121 03/16/14 0500  BP: 103/56 126/65 114/63   Pulse: 69 57 71   Temp: 97.6 F (36.4 C) 97.5 F (36.4 C) 97.7 F (36.5 C)   TempSrc: Oral Oral Oral   Resp: 18 16 19    Height:      Weight: 91.8 kg (202 lb 6.1 oz)   91 kg (200 lb 9.9 oz)  SpO2: 97% 100% 100%     General: BM, No acute distress, upset that he was not given IV pain medication overnight HEENT: NCAT, MMM   Cardiovascular: IRRR, nl S1, S2 no mrg, 2+ pulses, warm extremities  Respiratory: Rhonchorous, no focal rales or wheeze no increased WOB  Abdomen: NABS, soft, mildly distended, TTP diffusely wtihout rebound ro guarding, bilateral hernias  MSK: Normal tone and bulk, dried skin on right leg, 1+ bilateral LEE, unna boots in place Neuro: Grossly moves all extremities.    Discharge Instructions      Discharge Instructions   (HEART FAILURE PATIENTS) Call MD:  Anytime you have any of the following symptoms: 1) 3 pound weight gain in 24 hours or 5 pounds in 1 week 2) shortness of breath, with or without a dry hacking cough 3) swelling in the hands, feet or stomach 4) if you have to sleep on extra pillows at night in order to breathe.    Complete by:  As directed      Call MD for:  difficulty breathing, headache or visual disturbances    Complete by:  As directed      Call MD for:  extreme fatigue    Complete by:  As directed      Call MD for:  hives    Complete by:  As directed      Call MD for:  persistant dizziness or  light-headedness    Complete by:  As directed      Call MD for:  persistant nausea and vomiting    Complete by:  As directed      Call MD for:  severe uncontrolled pain    Complete by:  As directed      Call MD for:  temperature >100.4    Complete by:  As directed      Diet - low sodium heart healthy    Complete by:  As directed      Discharge instructions    Complete by:  As directed   Mr. Christiana was hospitalized with difficulty swallowing and was found to have pneumonia probably due to aspirating food and secretions.  He should continue his antibiotics twice a day until he has completed 7 days, next dose this evening.  Please crush his pills in applesauce and serve a soft foods diet.  Due to low blood pressure, dehydration, and low blood sugars, some medications have been temporarily stopped.  Please set these medications aside but I would not discard them since they may  be restarted by his cardiologist or primary care doctor at a later time.  Stop his hydralazine, isosorbide, niacin, glipizide, and metolazone.  I have also decreased his carvedilol.  He will need to follow up with cardiology in 1-2 weeks and with his primary care doctor in about 2-3 weeks.  Please weigh him daily and if he gains more than 3-lbs in one day or 5-lbs in 1 week, call Dr. Clayborne Dana office.  At his request, he should follow up with speech therapy at the [place].     Increase activity slowly    Complete by:  As directed             Medication List    STOP taking these medications       gabapentin 100 MG capsule  Commonly known as:  NEURONTIN     glipiZIDE 10 MG tablet  Commonly known as:  GLUCOTROL     hydrALAZINE 50 MG tablet  Commonly known as:  APRESOLINE     isosorbide dinitrate 20 MG tablet  Commonly known as:  ISORDIL     metolazone 2.5 MG tablet  Commonly known as:  ZAROXOLYN     niacin 500 MG tablet      TAKE these medications       allopurinol 100 MG tablet  Commonly known as:  ZYLOPRIM  Take 100 mg by mouth daily.     amoxicillin-clavulanate 250-62.5 MG/5ML suspension  Commonly known as:  AUGMENTIN  Take 10 mLs (500 mg total) by mouth 2 (two) times daily.     aspirin EC 81 MG tablet  Take 1 tablet (81 mg total) by mouth daily.     carboxymethylcellulose 1 % ophthalmic solution  Apply 1 drop to eye 4 (four) times daily.     carvedilol 6.25 MG tablet  Commonly known as:  COREG  Take 1 tablet (6.25 mg total) by mouth 2 (two) times daily with a meal.     ferrous sulfate 325 (65 FE) MG tablet  Take 325 mg by mouth 2 (two) times daily with a meal.     HYDROcodone-acetaminophen 5-325 MG per tablet  Commonly known as:  NORCO/VICODIN  Take 1-2 tablets by mouth every 4 (four) hours as needed for moderate pain.     mirtazapine 15 MG tablet  Commonly known as:  REMERON  Take 7.5 mg by mouth at bedtime.  omeprazole 20 MG capsule  Commonly known as:   PRILOSEC  Take 20 mg by mouth 2 (two) times daily before a meal.     psyllium 58.6 % powder  Commonly known as:  METAMUCIL  Take 1 packet by mouth 3 (three) times daily.     senna-docusate 8.6-50 MG per tablet  Commonly known as:  Senokot-S  Take 2 tablets by mouth 2 (two) times daily as needed for mild constipation.     sertraline 100 MG tablet  Commonly known as:  ZOLOFT  Take 200 mg by mouth at bedtime.     simvastatin 20 MG tablet  Commonly known as:  ZOCOR  Take 10 mg by mouth daily.     sucralfate 1 G tablet  Commonly known as:  CARAFATE  Take 1 tablet (1 g total) by mouth 4 (four) times daily -  with meals and at bedtime.     torsemide 20 MG tablet  Commonly known as:  DEMADEX  Take 20 mg by mouth 2 (two) times daily.     traZODone 50 MG tablet  Commonly known as:  DESYREL  Take 50 mg by mouth at bedtime.       Follow-up Information   Follow up with Glori Bickers, MD.   Specialty:  Cardiology   Contact information:   [address] [place] [address] [phone]       Follow up with Leola Brazil, MD. Schedule an appointment as soon as possible for a visit in 2 weeks. (As needed)    Specialty:  Pulmonary Disease   Contact information:   [place] [address] [phone]        The results of significant diagnostics from this hospitalization (including imaging, microbiology, ancillary and laboratory) are listed below for reference.    Significant Diagnostic Studies: Dg Neck Soft Tissue  03/11/2014   CLINICAL DATA:  Dysphagia.  EXAM: NECK SOFT TISSUES - 1+ VIEW  COMPARISON:  CT of the cervical spine September 25, 2013  FINDINGS: There is no evidence of retropharyngeal soft tissue swelling or epiglottic enlargement. The cervical airway is unremarkable and no radio-opaque foreign body identified.  Multiple linear radiopaque foreign bodies about the oral cavity consistent with dental hardware, recommend  direct inspection. Mild lateral bilateral soft tissue calcifications may be vascular. Bridging syndesmophytes most consistent with ankylosing spondylitis. Bilateral humeral arthroplasties.  IMPRESSION: No radiopaque foreign bodies along the included aerodigestive tract.   Electronically Signed   By: Elon Alas   On: 03/11/2014 04:34   Dg Chest 2 View  03/11/2014   CLINICAL DATA:  Patient swallowed a pill and felt like it got stuck.  EXAM: CHEST  2 VIEW  COMPARISON:  12/03/2013  FINDINGS: Cardiac enlargement without vascular congestion. Airspace disease in the right lung days suggesting developing infiltration or pneumonia. The no pneumothorax. No blunting of costophrenic angles. Degenerative changes in the spine and left shoulder. Loss of subacromial space consistent with chronic rotator cuff arthropathy on the left. Postoperative changes with right reverse shoulder arthroplasty.  IMPRESSION: Developing airspace disease in the right lung base suggesting pneumonia.   Electronically Signed   By: Lucienne Capers M.D.   On: 03/11/2014 04:31   US Renal  03/14/2014   CLINICAL DATA:  Acute renal failure.  Diabetes and hypertension.  EXAM: RENAL/URINARY TRACT ULTRASOUND COMPLETE  COMPARISON:  CT abdomen pelvis without contrast 10/23/2013. Abdominal ultrasound 11/22/2012  FINDINGS: Right Kidney:  Imaging is technically difficult due  to bowel gas and body habitus.  Length: Measures approximately 15 cm in length, including renal cysts. Echogenicity appears slightly increased. There is no hydronephrosis. Again seen are 2 cysts in the upper to mid pole. The largest is simple measures 5.1 x 4.8 x 4.7 cm. More superiorly is a smaller cyst with some internal echoes that measures 2.1 x 2.5 x 3.0 cm. The internal echoes are consistent with mild complexity, and this cyst has a similar appearance to the ultrasound of 11/22/2012.  Left Kidney:  Length: Measures approximately 14.2 cm in length, including the renal cysts. No  evidence of hydronephrosis. Echogenicity appears within normal limits, but evaluation is difficult due to limitation in image detail. There are multiple cysts involving the upper to mid pole. The largest cyst is 5.5 x 4.8 x 5.6 cm in the upper pole.  Bladder:  Appears normal for degree of bladder distention. A right ureteral jet is visualized. A left ureteral jet is not definitely seen.  IMPRESSION: 1. Image detail of the kidneys is limited by body habitus and bowel gas. Again seen are bilateral renal cysts, with a stable mildly complex cyst in the upper pole of the right kidney. 2. No evidence of hydronephrosis.   Electronically Signed   By: Curlene Dolphin M.D.   On: 03/14/2014 20:43   Dg Chest Port 1 View  03/12/2014   CLINICAL DATA:  Worsening cough, congestion, history pneumonia  EXAM: PORTABLE CHEST - 1 VIEW  COMPARISON:  03/11/2014; 12/03/2013  FINDINGS: Grossly unchanged enlarged cardiac silhouette and mediastinal contours given decreased lung volumes. Worsening bibasilar heterogeneous slightly nodular airspace opacities, right greater than left. There is worsening diffuse slightly nodular thickening of the pulmonary interstitium. No pleural effusion pneumothorax. Post right total shoulder replacement, incompletely evaluated. Marked degenerative change of the left glenohumeral and acromioclavicular joints, incompletely evaluated.  IMPRESSION: 1. Worsening bibasilar heterogeneous airspace opacities, right greater than left, worrisome for progression of multifocal infection. A follow-up chest radiograph in 4 to 6 weeks after treatment is recommended to ensure resolution. 2. Worsening slightly nodular thickening of the pulmonary interstitium, possibly accentuated due to technique though airways disease/bronchitis could have a similar appearance.   Electronically Signed   By: Sandi Mariscal M.D.   On: 03/12/2014 14:07    Microbiology: Recent Results (from the past 240 hour(s))  CULTURE, BLOOD (ROUTINE X 2)      Status: None   Collection Time    03/11/14  1:32 PM      Result Value Ref Range Status   Specimen Description BLOOD RIGHT HAND   Final   Special Requests BOTTLES DRAWN AEROBIC ONLY 4CC   Final   Culture  Setup Time     Final   Value: 03/11/2014 18:46     Performed at [facility]   Culture     Final   Value:        BLOOD CULTURE RECEIVED NO GROWTH TO DATE CULTURE WILL BE HELD FOR 5 DAYS BEFORE ISSUING A FINAL NEGATIVE REPORT     Performed at [facility]   Report Status PENDING   Incomplete  CULTURE, BLOOD (ROUTINE X 2)     Status: None   Collection Time    03/11/14  1:37 PM      Result Value Ref Range Status   Specimen Description BLOOD RIGHT ANTECUBITAL   Final   Special Requests BOTTLES DRAWN AEROBIC ONLY 4 CC    Final   Culture  Setup Time  Final   Value: 03/11/2014 18:46     Performed at [facility]   Culture     Final   Value:        BLOOD CULTURE RECEIVED NO GROWTH TO DATE CULTURE WILL BE HELD FOR 5 DAYS BEFORE ISSUING A FINAL NEGATIVE REPORT     Performed at [facility]   Report Status PENDING   Incomplete  CULTURE, EXPECTORATED SPUTUM-ASSESSMENT     Status: None   Collection Time    03/11/14  8:00 PM      Result Value Ref Range Status   Specimen Description SPUTUM   Final   Special Requests NONE   Final   Sputum evaluation     Final   Value: MICROSCOPIC FINDINGS SUGGEST THAT THIS SPECIMEN IS NOT REPRESENTATIVE OF LOWER RESPIRATORY SECRETIONS. PLEASE RECOLLECT.     CALLED REINERT,L/4E @2207  ON 03/11/14 BY KARCZEWSKI,S.   Report Status 03/11/2014 FINAL   Final  CULTURE, EXPECTORATED SPUTUM-ASSESSMENT     Status: None   Collection Time    03/12/14  5:18 AM      Result Value Ref Range Status   Specimen Description SPUTUM   Final   Special Requests Normal   Final   Sputum evaluation     Final   Value: THIS SPECIMEN IS ACCEPTABLE. RESPIRATORY CULTURE REPORT TO FOLLOW.   Report Status 03/12/2014 FINAL   Final  CULTURE, RESPIRATORY  (NON-EXPECTORATED)     Status: None   Collection Time    03/12/14  5:18 AM      Result Value Ref Range Status   Specimen Description SPUTUM   Final   Special Requests NONE   Final   Gram Stain     Final   Value: RARE WBC PRESENT, PREDOMINANTLY MONONUCLEAR     FEW SQUAMOUS EPITHELIAL CELLS PRESENT     RARE GRAM POSITIVE COCCI IN PAIRS     IN CLUSTERS RARE GRAM POSITIVE RODS     Performed at [facility]   Culture     Final   Value: NORMAL OROPHARYNGEAL FLORA     Performed at [facility]   Report Status 03/14/2014 FINAL   Final  URINE CULTURE     Status: None   Collection Time    03/14/14  2:16 PM      Result Value Ref Range Status   Specimen Description URINE, CLEAN CATCH   Final   Special Requests NONE   Final   Culture  Setup Time     Final   Value: 03/14/2014 20:21     Performed at [facility]     Final   Value: NO GROWTH     Performed at [facility]   Culture     Final   Value: NO GROWTH     Performed at [facility]   Report Status 03/15/2014 FINAL   Final     Labs: Basic Metabolic Panel:  Recent Labs Lab 03/12/14 0527 03/13/14 0508 03/14/14 0035 03/15/14 0440 03/16/14 0516  NA 138 138 139 136* 134*  K 3.9 3.4* 3.8 3.6* 3.4*  CL 103 102 105 99 100  CO2 18* 19 21 22 22   GLUCOSE 138* 147* 138* 119* 156*  BUN 39* 43* 47* 51* 43*  CREATININE 2.07* 2.60* 2.76* 2.70* 2.17*  CALCIUM 9.1 8.8 8.8 8.8 9.1  PHOS  --   --   --  2.4  --    Liver Function  Tests:  Recent Labs Lab 03/15/14 0440  ALBUMIN 2.2*   No results found for this basename: LIPASE, AMYLASE,  in the last 168 hours No results found for this basename: AMMONIA,  in the last 168 hours CBC:  Recent Labs Lab 03/11/14 0457 03/12/14 0527 03/13/14 0508 03/14/14 0035 03/15/14 0440 03/16/14 0516  WBC 7.8 13.4* 15.4* 11.8* 8.6 8.6  NEUTROABS 5.5  --   --   --   --   --   HGB 11.2* 11.7* 10.7* 9.6* 9.8* 10.7*  HCT 32.9* 36.3* 31.8* 27.9*  29.0* 31.6*  MCV 89.4 92.4 90.3 90.3 89.5 90.0  PLT 316 293 280 243 239 229   Cardiac Enzymes: No results found for this basename: CKTOTAL, CKMB, CKMBINDEX, TROPONINI,  in the last 168 hours BNP: BNP (last 3 results)  Recent Labs  10/05/13 0258 10/23/13 1700 11/27/13 1518  PROBNP 1056.0* 1346.0* 740.1*   CBG:  Recent Labs Lab 03/11/14 2139 03/13/14 2306  GLUCAP 156* 133*    Time coordinating discharge: 45 minutes  Signed:  Giabella Duhart  [facility] 03/16/2014, 11:33 AM

## 2014-03-17 LAB — CULTURE, BLOOD (ROUTINE X 2)
CULTURE: NO GROWTH
CULTURE: NO GROWTH

## 2014-03-17 NOTE — Progress Notes (Signed)
CARE MANAGEMENT NOTE 03/17/2014  Patient:  [name], [name]   Account Number:  [id number]  Date Initiated:  03/14/2014  Documentation initiated by:  Dessa Phi  Subjective/Objective Assessment:   80 Y/O M ADMITTED W/IMPACTED ESOPHAGEAL FOREIGN BODY.     Action/Plan:   FROM HOME.   Anticipated DC Date:  03/17/2014   Anticipated DC Plan:  [place]  CM consult      Choice offered to / List presented to:  C-4 Adult Children        HH arranged  HH-1 RN  [place].   Status of service:  In process, will continue to follow Medicare Important Message given?   (If response is "NO", the following Medicare IM given date fields will be blank) Date Medicare IM given:   Medicare IM given by:   Date Additional Medicare IM given:   Additional Medicare IM given by:    Discharge Disposition:  [place]  Per UR Regulation:  Reviewed for med. necessity/level of care/duration of stay  If discussed at [place] of Stay Meetings, dates discussed:    Comments:  03/16/14 MMcGibboney, RN, BSN Spoke with pt's son [name]. [phone], concerning Home Health. He was at the pt's home and could not find any information concerning HH. He selected [facility], referral was given to in house rep.   03/14/14 KATHY MAHABIR RN,BSN NCM [phone] SPOKE TO PATIENT'S SON [name] TEL#[phone] WHO IS UNSURE OF HHC AGENCY BUT KNOWS IT IS THROUGH THE  [facility]-HE WILL TRY TO CONFIRM.

## 2014-03-21 ENCOUNTER — Emergency Department (HOSPITAL_COMMUNITY)
Admission: EM | Admit: 2014-03-21 | Discharge: 2014-03-22 | Disposition: A | Discharge status: Home / Self Care | Payer: Medicare Other | Attending: Emergency Medicine | Admitting: Emergency Medicine

## 2014-03-21 ENCOUNTER — Encounter (HOSPITAL_COMMUNITY): Payer: Self-pay | Admitting: Emergency Medicine

## 2014-03-21 DIAGNOSIS — Z87898 Personal history of other specified conditions: Secondary | ICD-10-CM | POA: Diagnosis not present

## 2014-03-21 DIAGNOSIS — E1169 Type 2 diabetes mellitus with other specified complication: Secondary | ICD-10-CM | POA: Insufficient documentation

## 2014-03-21 DIAGNOSIS — R059 Cough, unspecified: Secondary | ICD-10-CM | POA: Insufficient documentation

## 2014-03-21 DIAGNOSIS — I509 Heart failure, unspecified: Secondary | ICD-10-CM | POA: Insufficient documentation

## 2014-03-21 DIAGNOSIS — Z7982 Long term (current) use of aspirin: Secondary | ICD-10-CM | POA: Diagnosis not present

## 2014-03-21 DIAGNOSIS — Z79899 Other long term (current) drug therapy: Secondary | ICD-10-CM | POA: Diagnosis not present

## 2014-03-21 DIAGNOSIS — Z8701 Personal history of pneumonia (recurrent): Secondary | ICD-10-CM | POA: Insufficient documentation

## 2014-03-21 DIAGNOSIS — E7800 Pure hypercholesterolemia, unspecified: Secondary | ICD-10-CM | POA: Insufficient documentation

## 2014-03-21 DIAGNOSIS — K439 Ventral hernia without obstruction or gangrene: Secondary | ICD-10-CM | POA: Insufficient documentation

## 2014-03-21 DIAGNOSIS — Z87442 Personal history of urinary calculi: Secondary | ICD-10-CM | POA: Diagnosis not present

## 2014-03-21 DIAGNOSIS — R110 Nausea: Secondary | ICD-10-CM | POA: Insufficient documentation

## 2014-03-21 DIAGNOSIS — R197 Diarrhea, unspecified: Secondary | ICD-10-CM | POA: Insufficient documentation

## 2014-03-21 DIAGNOSIS — I10 Essential (primary) hypertension: Secondary | ICD-10-CM | POA: Insufficient documentation

## 2014-03-21 DIAGNOSIS — R609 Edema, unspecified: Secondary | ICD-10-CM | POA: Diagnosis not present

## 2014-03-21 DIAGNOSIS — Z792 Long term (current) use of antibiotics: Secondary | ICD-10-CM | POA: Insufficient documentation

## 2014-03-21 DIAGNOSIS — M109 Gout, unspecified: Secondary | ICD-10-CM | POA: Insufficient documentation

## 2014-03-21 DIAGNOSIS — R05 Cough: Secondary | ICD-10-CM | POA: Insufficient documentation

## 2014-03-21 LAB — CBC WITH DIFFERENTIAL/PLATELET
BASOS ABS: 0 10*3/uL (ref 0.0–0.1)
BASOS PCT: 0 % (ref 0–1)
Eosinophils Absolute: 0.3 10*3/uL (ref 0.0–0.7)
Eosinophils Relative: 6 % — ABNORMAL HIGH (ref 0–5)
HCT: 31.1 % — ABNORMAL LOW (ref 39.0–52.0)
HEMOGLOBIN: 10.3 g/dL — AB (ref 13.0–17.0)
Lymphocytes Relative: 32 % (ref 12–46)
Lymphs Abs: 1.5 10*3/uL (ref 0.7–4.0)
MCH: 30.2 pg (ref 26.0–34.0)
MCHC: 33.1 g/dL (ref 30.0–36.0)
MCV: 91.2 fL (ref 78.0–100.0)
Monocytes Absolute: 0.6 10*3/uL (ref 0.1–1.0)
Monocytes Relative: 12 % (ref 3–12)
NEUTROS ABS: 2.3 10*3/uL (ref 1.7–7.7)
NEUTROS PCT: 50 % (ref 43–77)
Platelets: 218 10*3/uL (ref 150–400)
RBC: 3.41 MIL/uL — ABNORMAL LOW (ref 4.22–5.81)
RDW: 15 % (ref 11.5–15.5)
WBC: 4.7 10*3/uL (ref 4.0–10.5)

## 2014-03-21 LAB — COMPREHENSIVE METABOLIC PANEL
ALBUMIN: 2.8 g/dL — AB (ref 3.5–5.2)
ALT: 13 U/L (ref 0–53)
AST: 29 U/L (ref 0–37)
Alkaline Phosphatase: 108 U/L (ref 39–117)
Anion gap: 13 (ref 5–15)
BILIRUBIN TOTAL: 0.3 mg/dL (ref 0.3–1.2)
BUN: 34 mg/dL — AB (ref 6–23)
CHLORIDE: 101 meq/L (ref 96–112)
CO2: 22 mEq/L (ref 19–32)
Calcium: 9.3 mg/dL (ref 8.4–10.5)
Creatinine, Ser: 1.92 mg/dL — ABNORMAL HIGH (ref 0.50–1.35)
GFR calc Af Amer: 36 mL/min — ABNORMAL LOW (ref >90)
GFR calc non Af Amer: 31 mL/min — ABNORMAL LOW (ref >90)
Glucose, Bld: 72 mg/dL (ref 70–99)
Potassium: 4.1 mEq/L (ref 3.7–5.3)
Sodium: 136 mEq/L — ABNORMAL LOW (ref 137–147)
Total Protein: 7.1 g/dL (ref 6.0–8.3)

## 2014-03-21 LAB — CBG MONITORING, ED
GLUCOSE-CAPILLARY: 68 mg/dL — AB (ref 70–99)
Glucose-Capillary: 67 mg/dL — ABNORMAL LOW (ref 70–99)

## 2014-03-21 MED ORDER — HYDROCODONE-ACETAMINOPHEN 5-325 MG PO TABS
2.0000 | ORAL_TABLET | Freq: Once | ORAL | Status: AC
  Administered 2014-03-21: 2 via ORAL
  Filled 2014-03-21: qty 2

## 2014-03-21 NOTE — ED Notes (Signed)
Gave pt orange juice and that woke him up enough to take pain medication

## 2014-03-21 NOTE — ED Notes (Signed)
Pt states he has been on antibiotics and is having diarrhea about every 45 minutes  Pt states he is a diabetic and is unable to keep anything on his stomach  Pt states he was here recently and was hospitalized for almost a week and was discharged on Thursday  Pt has legs wrapped from the knees down and states they are wrapped to tight causing his legs to swell  Pt states he just does not feel well

## 2014-03-21 NOTE — ED Notes (Signed)
Stool culture walked to lab by this Probation officer and c diff

## 2014-03-21 NOTE — ED Notes (Signed)
Pt is currently sleeping. Orange juice was left at the bedside, per EDP [place].

## 2014-03-21 NOTE — ED Provider Notes (Signed)
CSN: [id number]     Arrival date & time 03/21/14  2055 History   First MD Initiated Contact with Patient 03/21/14 2130     Chief Complaint  Patient presents with  . Diarrhea     (Consider location/radiation/quality/duration/timing/severity/associated sxs/prior Treatment) HPI 80 year old male presents with diarrhea over the last several days. States it started worsening after he was placed on antibiotics when he was admitted for pneumonia. Every 45 minutes. Denies any blood in his stools. He felt nauseous without vomiting. He continues to have cough but denies any dyspnea. No urinary symptoms. He feels like his legs have been wrapped too tight as he was having fluid overload problems while in the hospital. The patient also endorses generalized pain "all over". This is a recurrent symptom as well and is requesting hydrocodone, which he normally takes from the [place]. Endorses decreased fluid intake. Of note, patient is a difficult historian and goes between different complaints.  Past Medical History  Diagnosis Date  . Pneumonia   . Hypertension   . Hypercholesteremia   . Diabetes mellitus   . Gout   . Irregular heart beat   . Kidney stone   . Lymphoma     s/p partial gastrectomy  . CHF (congestive heart failure)    Past Surgical History  Procedure Laterality Date  . Partial gastrectomy  1994    for lymphoma  . Hernia repair    . Joint replacement    . Back surgery    . Esophagogastroduodenoscopy N/A 10/28/2012    Procedure: ESOPHAGOGASTRODUODENOSCOPY (EGD);  Surgeon: Lear Ng, MD;  Location: [facility];  Service: Endoscopy;  Laterality: N/A;  . Esophagogastroduodenoscopy Left 03/11/2014    Procedure: ESOPHAGOGASTRODUODENOSCOPY (EGD);  Surgeon: Arta Silence, MD;  Location: [facility];  Service: Endoscopy;  Laterality: Left;   History reviewed. No pertinent family history. History  Substance Use Topics  . Smoking status: Never Smoker   . Smokeless tobacco: Never Used   . Alcohol Use: No    Review of Systems  Constitutional: Negative for fever.  Respiratory: Positive for cough.   Gastrointestinal: Positive for nausea and diarrhea. Negative for vomiting, abdominal pain and blood in stool.  Genitourinary: Negative for dysuria.  All other systems reviewed and are negative.     Allergies  Review of patient's allergies indicates no known allergies.  Home Medications   Prior to Admission medications   Medication Sig Start Date End Date Taking? Authorizing Provider  allopurinol (ZYLOPRIM) 100 MG tablet Take 100 mg by mouth daily.    Historical Provider, MD  amoxicillin-clavulanate (AUGMENTIN) 250-62.5 MG/5ML suspension Take 10 mLs (500 mg total) by mouth 2 (two) times daily. 03/16/14   Janece Canterbury, MD  aspirin EC 81 MG tablet Take 1 tablet (81 mg total) by mouth daily. 10/19/13   Jolaine Artist, MD  carboxymethylcellulose 1 % ophthalmic solution Apply 1 drop to eye 4 (four) times daily.    Historical Provider, MD  carvedilol (COREG) 6.25 MG tablet Take 1 tablet (6.25 mg total) by mouth 2 (two) times daily with a meal. 03/16/14   Janece Canterbury, MD  ferrous sulfate 325 (65 FE) MG tablet Take 325 mg by mouth 2 (two) times daily with a meal.    Historical Provider, MD  HYDROcodone-acetaminophen (NORCO/VICODIN) 5-325 MG per tablet Take 1-2 tablets by mouth every 4 (four) hours as needed for moderate pain.    Historical Provider, MD  mirtazapine (REMERON) 15 MG tablet Take 7.5 mg by mouth at bedtime.  Historical Provider, MD  omeprazole (PRILOSEC) 20 MG capsule Take 20 mg by mouth 2 (two) times daily before a meal.    Historical Provider, MD  psyllium (METAMUCIL) 58.6 % powder Take 1 packet by mouth 3 (three) times daily.    Historical Provider, MD  senna-docusate (SENOKOT-S) 8.6-50 MG per tablet Take 2 tablets by mouth 2 (two) times daily as needed for mild constipation.    Historical Provider, MD  sertraline (ZOLOFT) 100 MG tablet Take 200 mg by mouth  at bedtime.     Historical Provider, MD  simvastatin (ZOCOR) 20 MG tablet Take 10 mg by mouth daily.    Historical Provider, MD  sucralfate (CARAFATE) 1 G tablet Take 1 tablet (1 g total) by mouth 4 (four) times daily -  with meals and at bedtime. 03/16/14   Janece Canterbury, MD  torsemide (DEMADEX) 20 MG tablet Take 20 mg by mouth 2 (two) times daily.    Historical Provider, MD  traZODone (DESYREL) 50 MG tablet Take 50 mg by mouth at bedtime.    Historical Provider, MD   BP 130/66  Pulse 60  Temp(Src) 97.5 F (36.4 C) (Oral)  Resp 20  SpO2 98% Physical Exam  Nursing note and vitals reviewed. Constitutional: He is oriented to person, place, and time. He appears well-developed and well-nourished.  HENT:  Head: Normocephalic and atraumatic.  Right Ear: External ear normal.  Left Ear: External ear normal.  Nose: Nose normal.  Eyes: Right eye exhibits no discharge. Left eye exhibits no discharge.  Neck: Neck supple.  Cardiovascular: Normal rate, regular rhythm, normal heart sounds and intact distal pulses.   Pulmonary/Chest: Effort normal.  Abdominal: Soft. He exhibits no distension. There is no tenderness. A hernia (right sided abdominal wall hernia that is easily reducible) is present. Hernia confirmed positive in the ventral area.  Musculoskeletal: He exhibits no edema (mild bilateral lower extremity edema).  Neurological: He is alert and oriented to person, place, and time.  Skin: Skin is warm and dry.    ED Course  Procedures (including critical care time) Labs Review Labs Reviewed  CBC WITH DIFFERENTIAL - Abnormal; Notable for the following:    RBC 3.41 (*)    Hemoglobin 10.3 (*)    HCT 31.1 (*)    Eosinophils Relative 6 (*)    All other components within normal limits  COMPREHENSIVE METABOLIC PANEL - Abnormal; Notable for the following:    Sodium 136 (*)    BUN 34 (*)    Creatinine, Ser 1.92 (*)    Albumin 2.8 (*)    GFR calc non Af Amer 31 (*)    GFR calc Af Amer 36  (*)    All other components within normal limits  CBG MONITORING, ED - Abnormal; Notable for the following:    Glucose-Capillary 68 (*)    All other components within normal limits  CBG MONITORING, ED - Abnormal; Notable for the following:    Glucose-Capillary 67 (*)    All other components within normal limits  CLOSTRIDIUM DIFFICILE BY PCR  STOOL CULTURE  CBG MONITORING, ED    Imaging Review No results found.   EKG Interpretation None      MDM   Final diagnoses:  Diarrhea    Patient is well-appearing here, has normal vitals. Mildly hypoglycemic without symptoms, able to eat and drink here. Given the amount of diarrhea the patient is enduring while on antibiotics, will need r/o for C-diff with placement on flagyl if positive. Overall he  is well and would be stable for discharge either way. Care transferred to Dr. Venora Maples with c-diff result pending.    Ephraim Hamburger, MD 03/22/14 0030

## 2014-03-21 NOTE — ED Notes (Signed)
Pt has been asleep entire time,  Did not give pain medication

## 2014-03-22 LAB — CLOSTRIDIUM DIFFICILE BY PCR: Toxigenic C. Difficile by PCR: NEGATIVE

## 2014-03-22 MED ORDER — HYDROCODONE-ACETAMINOPHEN 5-325 MG PO TABS
2.0000 | ORAL_TABLET | Freq: Once | ORAL | Status: AC
  Administered 2014-03-22: 2 via ORAL
  Filled 2014-03-22: qty 2

## 2014-03-22 MED ORDER — DIPHENHYDRAMINE HCL 25 MG PO CAPS
25.0000 mg | ORAL_CAPSULE | Freq: Once | ORAL | Status: AC
  Administered 2014-03-22: 25 mg via ORAL
  Filled 2014-03-22: qty 1

## 2014-03-22 NOTE — Discharge Instructions (Signed)
Diarrhea  Diarrhea is frequent loose and watery bowel movements. It can cause you to feel weak and dehydrated. Dehydration can cause you to become tired and thirsty, have a dry mouth, and have decreased urination that often is dark yellow. Diarrhea is a sign of another problem, most often an infection that will not last long. In most cases, diarrhea typically lasts 2-3 days. However, it can last longer if it is a sign of something more serious. It is important to treat your diarrhea as directed by your caregive to lessen or prevent future episodes of diarrhea.  CAUSES   Some common causes include:   Gastrointestinal infections caused by viruses, bacteria, or parasites.   Food poisoning or food allergies.   Certain medicines, such as antibiotics, chemotherapy, and laxatives.   Artificial sweeteners and fructose.   Digestive disorders.  HOME CARE INSTRUCTIONS   Ensure adequate fluid intake (hydration): have 1 cup (8 oz) of fluid for each diarrhea episode. Avoid fluids that contain simple sugars or sports drinks, fruit juices, whole milk products, and sodas. Your urine should be clear or pale yellow if you are drinking enough fluids. Hydrate with an oral rehydration solution that you can purchase at pharmacies, retail stores, and online. You can prepare an oral rehydration solution at home by mixing the following ingredients together:    - tsp table salt.    tsp baking soda.    tsp salt substitute containing potassium chloride.   1  tablespoons sugar.   1 L (34 oz) of water.   Certain foods and beverages may increase the speed at which food moves through the gastrointestinal (GI) tract. These foods and beverages should be avoided and include:   Caffeinated and alcoholic beverages.   High-fiber foods, such as raw fruits and vegetables, nuts, seeds, and whole grain breads and cereals.   Foods and beverages sweetened with sugar alcohols, such as xylitol, sorbitol, and mannitol.   Some foods may be well  tolerated and may help thicken stool including:   Starchy foods, such as rice, toast, pasta, low-sugar cereal, oatmeal, grits, baked potatoes, crackers, and bagels.   Bananas.   Applesauce.   Add probiotic-rich foods to help increase healthy bacteria in the GI tract, such as yogurt and fermented milk products.   Wash your hands well after each diarrhea episode.   Only take over-the-counter or prescription medicines as directed by your caregiver.   Take a warm bath to relieve any burning or pain from frequent diarrhea episodes.  SEEK IMMEDIATE MEDICAL CARE IF:    You are unable to keep fluids down.   You have persistent vomiting.   You have blood in your stool, or your stools are black and tarry.   You do not urinate in 6-8 hours, or there is only a small amount of very dark urine.   You have abdominal pain that increases or localizes.   You have weakness, dizziness, confusion, or lightheadedness.   You have a severe headache.   Your diarrhea gets worse or does not get better.   You have a fever or persistent symptoms for more than 2-3 days.   You have a fever and your symptoms suddenly get worse.  MAKE SURE YOU:    Understand these instructions.   Will watch your condition.   Will get help right away if you are not doing well or get worse.  Document Released: 08/08/2002 Document Revised: 08/04/2012 Document Reviewed: 04/25/2012  Exit[employer] Patient Information 2015 [employer]. This information   is not intended to replace advice given to you by your health care provider. Make sure you discuss any questions you have with your health care provider.

## 2014-03-22 NOTE — ED Notes (Signed)
Pt has ate a [place] sandwich and 2 cups of orange juice and coffee with sugar

## 2014-03-22 NOTE — ED Notes (Signed)
Pt has been discharged but is awaiting a ride from his son whom a message was left with by Juliette Mangle charge

## 2014-03-22 NOTE — ED Provider Notes (Signed)
12:27 AM Pt well appearing. Eating. No complaints. abd benign. Will follow up on C diff. Will call in prescription to [facility] on [address] if positive. Will contact pt at [phone] for additional information depending on stool sample  pcp follow up.   Hoy Morn, MD 03/22/14 Shelah Lewandowsky

## 2014-03-25 LAB — STOOL CULTURE

## 2014-04-13 ENCOUNTER — Encounter (HOSPITAL_COMMUNITY): Payer: Self-pay | Admitting: Emergency Medicine

## 2014-04-13 ENCOUNTER — Emergency Department (HOSPITAL_COMMUNITY)
Admission: EM | Admit: 2014-04-13 | Discharge: 2014-04-14 | Disposition: A | Discharge status: Home / Self Care | Payer: Medicare Other | Attending: Emergency Medicine | Admitting: Emergency Medicine

## 2014-04-13 ENCOUNTER — Emergency Department (HOSPITAL_COMMUNITY): Payer: Medicare Other

## 2014-04-13 DIAGNOSIS — I509 Heart failure, unspecified: Secondary | ICD-10-CM | POA: Diagnosis not present

## 2014-04-13 DIAGNOSIS — Z8701 Personal history of pneumonia (recurrent): Secondary | ICD-10-CM | POA: Diagnosis not present

## 2014-04-13 DIAGNOSIS — M109 Gout, unspecified: Secondary | ICD-10-CM | POA: Insufficient documentation

## 2014-04-13 DIAGNOSIS — E7800 Pure hypercholesterolemia, unspecified: Secondary | ICD-10-CM | POA: Diagnosis not present

## 2014-04-13 DIAGNOSIS — I10 Essential (primary) hypertension: Secondary | ICD-10-CM | POA: Insufficient documentation

## 2014-04-13 DIAGNOSIS — Z79899 Other long term (current) drug therapy: Secondary | ICD-10-CM | POA: Insufficient documentation

## 2014-04-13 DIAGNOSIS — Z87898 Personal history of other specified conditions: Secondary | ICD-10-CM | POA: Insufficient documentation

## 2014-04-13 DIAGNOSIS — Z87442 Personal history of urinary calculi: Secondary | ICD-10-CM | POA: Diagnosis not present

## 2014-04-13 DIAGNOSIS — K529 Noninfective gastroenteritis and colitis, unspecified: Secondary | ICD-10-CM

## 2014-04-13 DIAGNOSIS — R197 Diarrhea, unspecified: Secondary | ICD-10-CM | POA: Diagnosis present

## 2014-04-13 DIAGNOSIS — Z7982 Long term (current) use of aspirin: Secondary | ICD-10-CM | POA: Diagnosis not present

## 2014-04-13 DIAGNOSIS — E119 Type 2 diabetes mellitus without complications: Secondary | ICD-10-CM | POA: Insufficient documentation

## 2014-04-13 DIAGNOSIS — Z9889 Other specified postprocedural states: Secondary | ICD-10-CM | POA: Insufficient documentation

## 2014-04-13 LAB — CBC WITH DIFFERENTIAL/PLATELET
Basophils Absolute: 0 10*3/uL (ref 0.0–0.1)
Basophils Relative: 0 % (ref 0–1)
Eosinophils Absolute: 0.2 10*3/uL (ref 0.0–0.7)
Eosinophils Relative: 2 % (ref 0–5)
HCT: 31.8 % — ABNORMAL LOW (ref 39.0–52.0)
Hemoglobin: 11 g/dL — ABNORMAL LOW (ref 13.0–17.0)
LYMPHS ABS: 1.6 10*3/uL (ref 0.7–4.0)
LYMPHS PCT: 19 % (ref 12–46)
MCH: 30.6 pg (ref 26.0–34.0)
MCHC: 34.6 g/dL (ref 30.0–36.0)
MCV: 88.3 fL (ref 78.0–100.0)
Monocytes Absolute: 0.7 10*3/uL (ref 0.1–1.0)
Monocytes Relative: 9 % (ref 3–12)
NEUTROS PCT: 70 % (ref 43–77)
Neutro Abs: 6 10*3/uL (ref 1.7–7.7)
PLATELETS: 225 10*3/uL (ref 150–400)
RBC: 3.6 MIL/uL — AB (ref 4.22–5.81)
RDW: 14.8 % (ref 11.5–15.5)
WBC: 8.5 10*3/uL (ref 4.0–10.5)

## 2014-04-13 LAB — POC OCCULT BLOOD, ED: FECAL OCCULT BLD: NEGATIVE

## 2014-04-13 LAB — URINALYSIS, ROUTINE W REFLEX MICROSCOPIC
BILIRUBIN URINE: NEGATIVE
Glucose, UA: NEGATIVE mg/dL
Hgb urine dipstick: NEGATIVE
Ketones, ur: NEGATIVE mg/dL
LEUKOCYTES UA: NEGATIVE
Nitrite: NEGATIVE
PH: 5.5 (ref 5.0–8.0)
Protein, ur: NEGATIVE mg/dL
SPECIFIC GRAVITY, URINE: 1.011 (ref 1.005–1.030)
Urobilinogen, UA: 0.2 mg/dL (ref 0.0–1.0)

## 2014-04-13 LAB — COMPREHENSIVE METABOLIC PANEL
ALK PHOS: 117 U/L (ref 39–117)
ALT: 37 U/L (ref 0–53)
AST: 106 U/L — ABNORMAL HIGH (ref 0–37)
Albumin: 2.7 g/dL — ABNORMAL LOW (ref 3.5–5.2)
Anion gap: 16 — ABNORMAL HIGH (ref 5–15)
BUN: 43 mg/dL — ABNORMAL HIGH (ref 6–23)
CO2: 21 meq/L (ref 19–32)
Calcium: 9.2 mg/dL (ref 8.4–10.5)
Chloride: 99 mEq/L (ref 96–112)
Creatinine, Ser: 1.88 mg/dL — ABNORMAL HIGH (ref 0.50–1.35)
GFR calc non Af Amer: 32 mL/min — ABNORMAL LOW (ref >90)
GFR, EST AFRICAN AMERICAN: 37 mL/min — AB (ref >90)
GLUCOSE: 90 mg/dL (ref 70–99)
POTASSIUM: 3.3 meq/L — AB (ref 3.7–5.3)
SODIUM: 136 meq/L — AB (ref 137–147)
TOTAL PROTEIN: 7.5 g/dL (ref 6.0–8.3)
Total Bilirubin: 0.4 mg/dL (ref 0.3–1.2)

## 2014-04-13 LAB — PRO B NATRIURETIC PEPTIDE: Pro B Natriuretic peptide (BNP): 7348 pg/mL — ABNORMAL HIGH (ref 0–450)

## 2014-04-13 LAB — I-STAT CG4 LACTIC ACID, ED: Lactic Acid, Venous: 0.66 mmol/L (ref 0.5–2.2)

## 2014-04-13 LAB — LIPASE, BLOOD: Lipase: 55 U/L (ref 11–59)

## 2014-04-13 MED ORDER — FUROSEMIDE 10 MG/ML IJ SOLN
40.0000 mg | Freq: Once | INTRAMUSCULAR | Status: AC
  Administered 2014-04-13: 40 mg via INTRAVENOUS
  Filled 2014-04-13: qty 4

## 2014-04-13 MED ORDER — OXYCODONE-ACETAMINOPHEN 5-325 MG PO TABS
1.0000 | ORAL_TABLET | Freq: Once | ORAL | Status: AC
  Administered 2014-04-13: 1 via ORAL
  Filled 2014-04-13: qty 1

## 2014-04-13 NOTE — ED Notes (Signed)
Pt presents with c/o diarrhea for three weeks. Pt was seen here for the same several weeks ago. Pt also says that his head is hurting. Pt is a very poor historian, family dropped family off at the front door and left.

## 2014-04-13 NOTE — ED Notes (Signed)
Bed: WA21 Expected date:  Expected time:  Means of arrival:  Comments: Triage 2 

## 2014-04-13 NOTE — ED Provider Notes (Signed)
CSN: [id number]     Arrival date & time 04/13/14  2046 History   First MD Initiated Contact with Patient 04/13/14 2138     Chief Complaint  Patient presents with  . Diarrhea     (Consider location/radiation/quality/duration/timing/severity/associated sxs/prior Treatment) HPI 80 year old male with history of non-insulin-dependent diabetes, hypercholesterolemia, lymphoma status post partial gastrectomy, CHF presents for evaluation of persistent diarrhea. History is difficult to obtain as patient is hard of hearing, hard to understand, and is a poor historian. Per nursing note, patient has been having diarrhea for the past 3 weeks, family, patient of the front door and left. Patient reports he is having persistent diarrhea ongoing for 4 weeks. He did not specify the amount. He complains of generalized body aches and abdominal pain. He endorsed cough. He is requesting for medication for his diarrhea. He requesting for pain medication.  He report his leg is wrapped by nurse from [place] for ?pedal edema.  Patient was seen in the ED for the same complaint on July 22. He was up-to-date discharge. His C. difficile was obtained and was negative.     Past Medical History  Diagnosis Date  . Pneumonia   . Hypertension   . Hypercholesteremia   . Diabetes mellitus   . Gout   . Irregular heart beat   . Kidney stone   . Lymphoma     s/p partial gastrectomy  . CHF (congestive heart failure)    Past Surgical History  Procedure Laterality Date  . Partial gastrectomy  1994    for lymphoma  . Hernia repair    . Joint replacement    . Back surgery    . Esophagogastroduodenoscopy N/A 10/28/2012    Procedure: ESOPHAGOGASTRODUODENOSCOPY (EGD);  Surgeon: Lear Ng, MD;  Location: [facility];  Service: Endoscopy;  Laterality: N/A;  . Esophagogastroduodenoscopy Left 03/11/2014    Procedure: ESOPHAGOGASTRODUODENOSCOPY (EGD);  Surgeon: Arta Silence, MD;  Location: [facility];  Service: Endoscopy;   Laterality: Left;   No family history on file. History  Substance Use Topics  . Smoking status: Never Smoker   . Smokeless tobacco: Never Used  . Alcohol Use: No    Review of Systems  Unable to perform ROS: Age  poor historian    Allergies  Review of patient's allergies indicates no known allergies.  Home Medications   Prior to Admission medications   Medication Sig Start Date End Date Taking? Authorizing Provider  allopurinol (ZYLOPRIM) 100 MG tablet Take 100 mg by mouth daily.   Yes Historical Provider, MD  aspirin EC 81 MG tablet Take 1 tablet (81 mg total) by mouth daily. 10/19/13  Yes Jolaine Artist, MD  carboxymethylcellulose 1 % ophthalmic solution Apply 1 drop to eye 4 (four) times daily.   Yes Historical Provider, MD  carvedilol (COREG) 6.25 MG tablet Take 1 tablet (6.25 mg total) by mouth 2 (two) times daily with a meal. 03/16/14  Yes Janece Canterbury, MD  ferrous sulfate 325 (65 FE) MG tablet Take 325 mg by mouth 2 (two) times daily with a meal.   Yes Historical Provider, MD  HYDROcodone-acetaminophen (NORCO/VICODIN) 5-325 MG per tablet Take 1-2 tablets by mouth every 4 (four) hours as needed for moderate pain.   Yes Historical Provider, MD  mirtazapine (REMERON) 15 MG tablet Take 7.5 mg by mouth at bedtime.   Yes Historical Provider, MD  omeprazole (PRILOSEC) 20 MG capsule Take 20 mg by mouth 2 (two) times daily before a meal.   Yes Historical Provider,  MD  potassium chloride SA (K-DUR,KLOR-CON) 20 MEQ tablet Take 20 mEq by mouth daily. 03/08/14  Yes Historical Provider, MD  psyllium (METAMUCIL) 58.6 % powder Take 1 packet by mouth 3 (three) times daily.   Yes Historical Provider, MD  senna-docusate (SENOKOT-S) 8.6-50 MG per tablet Take 2 tablets by mouth 2 (two) times daily as needed for mild constipation.   Yes Historical Provider, MD  sertraline (ZOLOFT) 100 MG tablet Take 200 mg by mouth at bedtime.    Yes Historical Provider, MD  simvastatin (ZOCOR) 20 MG tablet  Take 10 mg by mouth daily.   Yes Historical Provider, MD  sucralfate (CARAFATE) 1 G tablet Take 1 tablet (1 g total) by mouth 4 (four) times daily -  with meals and at bedtime. 03/16/14  Yes Janece Canterbury, MD  torsemide (DEMADEX) 20 MG tablet Take 20 mg by mouth 2 (two) times daily.   Yes Historical Provider, MD  traZODone (DESYREL) 50 MG tablet Take 50 mg by mouth at bedtime.   Yes Historical Provider, MD   BP 126/62  Pulse 52  Temp(Src) 97.8 F (36.6 C) (Oral)  Resp 14  SpO2 97% Physical Exam  Constitutional: He appears well-developed and well-nourished. No distress.  HENT:  Head: Atraumatic.  Oral mucosa moist, partial denture in place.  Eyes: Conjunctivae are normal.  Neck: Normal range of motion. Neck supple. No JVD present.  Cardiovascular: Normal rate and regular rhythm.   Pulmonary/Chest: Effort normal. He has rales (Rales heard at left lobe lung base no wheezes or rhonchi.).  Abdominal: Soft.  Abdomen is soft, reducible right ventral hernia, are tender to palpation. Well-healing midline abdominal surgical scar  Musculoskeletal:  Bilateral lower extremities was wrapped in coban wrapping.    Neurological: He is alert.  Alert to self and place.  Skin: No rash noted.  Psychiatric: He has a normal mood and affect.    ED Course  Procedures (including critical care time)  10:09 PM Pt here c/o persistent diarrhea x 1 month.  C.diff negative on 7/22 when he had essentially the same complaint.  Work up initiated.  Care discussed with DR. Knapp  11:23 PM Pt was admitted last month for HCAP and dysphagia.  Was treated with Vanc/Zosyn for suspected aspiration pna.  Pt also found to have esophageal dysmotility, suspect achalasia.  Today's labs significant for BNP of 7348, markedly higher than previous.  Endorse persistent cough, CXR demonstrates minimal bibasilar airspace opacities which may reflect residual or recurrent pna.  Although pt has persistent cough, he denies SOB.  Will  check rectal temp.    12:11 AM Normal rectal temp, able to ambulate with assist.  Plan to d/c with acidophilus probiotic and imodium for diarrhea.  Pt to f/u closely with PCP.  Cough likely residual from prior pneumonia.  Renal function at baseline.    Labs Review Labs Reviewed  CBC WITH DIFFERENTIAL - Abnormal; Notable for the following:    RBC 3.60 (*)    Hemoglobin 11.0 (*)    HCT 31.8 (*)    All other components within normal limits  COMPREHENSIVE METABOLIC PANEL - Abnormal; Notable for the following:    Sodium 136 (*)    Potassium 3.3 (*)    BUN 43 (*)    Creatinine, Ser 1.88 (*)    Albumin 2.7 (*)    AST 106 (*)    GFR calc non Af Amer 32 (*)    GFR calc Af Amer 37 (*)    Anion gap  16 (*)    All other components within normal limits  PRO B NATRIURETIC PEPTIDE - Abnormal; Notable for the following:    Pro B Natriuretic peptide (BNP) 7348.0 (*)    All other components within normal limits  CBG MONITORING, ED - Abnormal; Notable for the following:    Glucose-Capillary 65 (*)    All other components within normal limits  LIPASE, BLOOD  URINALYSIS, ROUTINE W REFLEX MICROSCOPIC  I-STAT CG4 LACTIC ACID, ED  POC OCCULT BLOOD, ED    Imaging Review Dg Chest 2 View  04/13/2014   CLINICAL DATA:  Cough.  EXAM: CHEST  2 VIEW  COMPARISON:  Chest radiograph from 03/12/2014  FINDINGS: The lungs are well-aerated. Minimal bibasilar airspace opacities may reflect residual or recurrent pneumonia, on comparison with prior studies. There is no evidence of pleural effusion or pneumothorax.  The heart is borderline normal in size; the mediastinal contour is within normal limits. No acute osseous abnormalities are seen. The patient's right shoulder arthroplasty is incompletely imaged but appears grossly unremarkable. There is chronic superior subluxation of the left humeral head.  IMPRESSION: Minimal bibasilar airspace opacities may reflect residual or recurrent pneumonia.   Electronically Signed    By: Garald Balding M.D.   On: 04/13/2014 23:29     EKG Interpretation None      MDM   Final diagnoses:  Chronic diarrhea    BP 126/62  Pulse 52  Temp(Src) 98.2 F (36.8 C) (Rectal)  Resp 14  Wt 194 lb 8 oz (88.225 kg)  SpO2 97%  I have reviewed nursing notes and vital signs. I personally reviewed the imaging tests through PACS system  I reviewed available ER/hospitalization records thought the EMR     Domenic Moras, [place] 04/14/14 0013

## 2014-04-13 NOTE — ED Provider Notes (Signed)
Pt brought to the ED by family for persistant diarrhea. He reports about 4-5 episodes a day. Pt noted to be coughing frequently during his interview. Pt is hard to understand because his upper dentures fall when he talks.   Medical screening examination/treatment/procedure(s) were conducted as a shared visit with non-physician practitioner(s) and myself.  I personally evaluated the patient during the encounter.   EKG Interpretation None       Roy Porter, MD, Abram Sander   Janice Norrie, MD 04/13/14 [phone]

## 2014-04-14 LAB — CBG MONITORING, ED
GLUCOSE-CAPILLARY: 115 mg/dL — AB (ref 70–99)
GLUCOSE-CAPILLARY: 65 mg/dL — AB (ref 70–99)

## 2014-04-14 MED ORDER — ACETAMINOPHEN 325 MG PO TABS
650.0000 mg | ORAL_TABLET | Freq: Once | ORAL | Status: AC
  Administered 2014-04-14: 650 mg via ORAL
  Filled 2014-04-14: qty 2

## 2014-04-14 MED ORDER — LOPERAMIDE HCL 2 MG PO CAPS: 2.0000 mg | ORAL_CAPSULE | Freq: Four times a day (QID) | ORAL | Status: DC | PRN

## 2014-04-14 NOTE — ED Notes (Signed)
Bed: [facility] Expected date:  Expected time:  Means of arrival:  Comments: 

## 2014-04-14 NOTE — ED Notes (Signed)
Pt O2 stayed between 93-96 during ambulation

## 2014-04-14 NOTE — ED Notes (Signed)
Pt said he felt like his sugar was low.  I checked it.  Read on the low end.  Given orange juice and other snacks

## 2014-04-14 NOTE — Discharge Instructions (Signed)
Please take Imodium for your diarrhea.  You may also benefit from taking acidophilus probiotic supplementation (from any drug store) to help replenish your normal body bacteria and decrease risk of prolonged diarrhea.  Follow up closely with your doctor as your congestive heart failure level is elevated today.    Chronic Diarrhea Diarrhea is frequent loose and watery bowel movements. It can cause you to feel weak and dehydrated. Dehydration can cause you to become tired and thirsty and to have a dry mouth, decreased urination, and dark yellow urine. Diarrhea is a sign of another problem, most often an infection that will not last long. In most cases, diarrhea lasts 2-3 days. Diarrhea that lasts longer than 4 weeks is called long-lasting (chronic) diarrhea. It is important to treat your diarrhea as directed by your health care provider to lessen or prevent future episodes of diarrhea.  CAUSES  There are many causes of chronic diarrhea. The following are some possible causes:   Gastrointestinal infections caused by viruses, bacteria, or parasites.   Food poisoning or food allergies.   Certain medicines, such as antibiotics, chemotherapy, and laxatives.   Artificial sweeteners and fructose.   Digestive disorders, such as celiac disease and inflammatory bowel diseases.   Irritable bowel syndrome.  Some disorders of the pancreas.  Disorders of the thyroid.  Reduced blood flow to the intestines.  Cancer. Sometimes the cause of chronic diarrhea is unknown. RISK FACTORS  Having a severely weakened immune system, such as from HIV or AIDS.   Taking certain types of cancer-fighting drugs (such as with chemotherapy) or other medicines.   Having had a recent organ transplant.   Having a portion of the stomach or small bowel removed.   Traveling to countries where food and water supplies are often contaminated.  SYMPTOMS  In addition to frequent, loose stools, diarrhea may cause:     Cramping.   Abdominal pain.   Nausea.   Fever.  Fatigue.  Urgent need to use the bathroom.  Loss of bowel control. DIAGNOSIS  Your health care provider must take a careful history and perform a physical exam. Tests given are based on your symptoms and history. Tests may include:   Blood or stool tests. Three or more stool samples may be examined. Stool cultures may be used to test for bacteria or parasites.   X-rays.   A procedure in which a thin tube is inserted into the mouth or rectum (endoscopy). This allows the health care provider to look inside the intestine.  TREATMENT   Treatment is aimed at correcting the cause of the diarrhea when possible.  Diarrhea caused by an infection can often be treated with antibiotic medicines.  Diarrhea not caused by an infection may require you to take long-term medicine or have surgery. Specific treatment should be discussed with your health care provider.  If the cause cannot be determined, treatment aims to relieve symptoms and prevent dehydration. Serious health problems can occur if you do not maintain proper fluid levels. Treatment may include:  Taking an oral rehydration solution (ORS).  Not drinking beverages that contain caffeine (such as tea, coffee, and soft drinks).  Not drinking alcohol.  Maintaining well-balanced nutrition to help you recover faster. HOME CARE INSTRUCTIONS   Drink enough fluids to keep urine clear or pale yellow. Drink 1 cup (8 oz) of fluid for each diarrhea episode. Avoid fluids that contain simple sugars, fruit juices, whole milk products, and sodas. Hydrate with an ORS. You may purchase the ORS  or prepare it at home by mixing the following ingredients together:   - tsp (1.7-3  mL) table salt.   tsp (3  mL) baking soda.   tsp (1.7 mL) salt substitute containing potassium chloride.  1 tbsp (20 mL) sugar.  4.2 c (1 L) of water.   Certain foods and beverages may increase the speed at  which food moves through the gastrointestinal (GI) tract. These foods and beverages should be avoided. They include:  Caffeinated and alcoholic beverages.  High-fiber foods, such as raw fruits and vegetables, nuts, seeds, and whole grain breads and cereals.  Foods and beverages sweetened with sugar alcohols, such as xylitol, sorbitol, and mannitol.   Some foods may be well tolerated and may help thicken stool. These include:  Starchy foods, such as rice, toast, pasta, low-sugar cereal, oatmeal, grits, baked potatoes, crackers, and bagels.  Bananas.  Applesauce.  Add probiotic-rich foods to help increase healthy bacteria in the GI tract. These include yogurt and fermented milk products.  Wash your hands well after each diarrhea episode.  Only take over-the-counter or prescription medicines as directed by your health care provider.  Take a warm bath to relieve any burning or pain from frequent diarrhea episodes. SEEK MEDICAL CARE IF:   You are not urinating as often.  Your urine is a dark color.  You become very tired or dizzy.  You have severe pain in the abdomen or rectum.  Your have blood or pus in your stools.  Your stools look black and tarry. SEEK IMMEDIATE MEDICAL CARE IF:   You are unable to keep fluids down.  You have persistent vomiting.  You have blood in your stool.  Your stools are black and tarry.  You do not urinate in 6-8 hours, or there is only a small amount of very dark urine.  You have abdominal pain that increases or localizes.  You have weakness, dizziness, confusion, or lightheadedness.  You have a severe headache.  Your diarrhea gets worse or does not get better.  You have a fever or persistent symptoms for more than 2-3 days.  You have a fever and your symptoms suddenly get worse. MAKE SURE YOU:   Understand these instructions.  Will watch your condition.  Will get help right away if you are not doing well or get  worse. Document Released: 11/08/2003 Document Revised: 08/23/2013 Document Reviewed: 02/10/2013 [facility] Patient Information 2015 [place], [place]. This information is not intended to replace advice given to you by your health care provider. Make sure you discuss any questions you have with your health care provider.

## 2014-04-14 NOTE — ED Provider Notes (Signed)
See prior note   Janice Norrie, MD 04/14/14 [phone]

## 2014-04-17 ENCOUNTER — Encounter (HOSPITAL_COMMUNITY): Payer: Self-pay

## 2014-04-17 ENCOUNTER — Ambulatory Visit (HOSPITAL_COMMUNITY)
Admission: RE | Admit: 2014-04-17 | Discharge: 2014-04-17 | Disposition: A | Discharge status: Home / Self Care | Payer: Medicare Other | Source: Ambulatory Visit | Attending: Cardiology | Admitting: Cardiology

## 2014-04-17 VITALS — BP 119/64 | HR 66 | Resp 18 | Wt 190.2 lb

## 2014-04-17 DIAGNOSIS — E7800 Pure hypercholesterolemia, unspecified: Secondary | ICD-10-CM | POA: Insufficient documentation

## 2014-04-17 DIAGNOSIS — I509 Heart failure, unspecified: Secondary | ICD-10-CM | POA: Diagnosis not present

## 2014-04-17 DIAGNOSIS — N183 Chronic kidney disease, stage 3 unspecified: Secondary | ICD-10-CM | POA: Diagnosis not present

## 2014-04-17 DIAGNOSIS — I5032 Chronic diastolic (congestive) heart failure: Secondary | ICD-10-CM | POA: Diagnosis not present

## 2014-04-17 DIAGNOSIS — Z87898 Personal history of other specified conditions: Secondary | ICD-10-CM | POA: Diagnosis not present

## 2014-04-17 DIAGNOSIS — L97909 Non-pressure chronic ulcer of unspecified part of unspecified lower leg with unspecified severity: Secondary | ICD-10-CM | POA: Insufficient documentation

## 2014-04-17 DIAGNOSIS — E1169 Type 2 diabetes mellitus with other specified complication: Secondary | ICD-10-CM | POA: Insufficient documentation

## 2014-04-17 DIAGNOSIS — I129 Hypertensive chronic kidney disease with stage 1 through stage 4 chronic kidney disease, or unspecified chronic kidney disease: Secondary | ICD-10-CM | POA: Insufficient documentation

## 2014-04-17 DIAGNOSIS — Z7982 Long term (current) use of aspirin: Secondary | ICD-10-CM | POA: Insufficient documentation

## 2014-04-17 DIAGNOSIS — I10 Essential (primary) hypertension: Secondary | ICD-10-CM

## 2014-04-17 DIAGNOSIS — I50810 Right heart failure, unspecified: Secondary | ICD-10-CM

## 2014-04-17 DIAGNOSIS — I872 Venous insufficiency (chronic) (peripheral): Secondary | ICD-10-CM | POA: Insufficient documentation

## 2014-04-17 DIAGNOSIS — M109 Gout, unspecified: Secondary | ICD-10-CM | POA: Diagnosis not present

## 2014-04-17 LAB — BASIC METABOLIC PANEL
ANION GAP: 18 — AB (ref 5–15)
BUN: 47 mg/dL — AB (ref 6–23)
CHLORIDE: 100 meq/L (ref 96–112)
CO2: 18 mEq/L — ABNORMAL LOW (ref 19–32)
Calcium: 8.8 mg/dL (ref 8.4–10.5)
Creatinine, Ser: 2.18 mg/dL — ABNORMAL HIGH (ref 0.50–1.35)
GFR calc Af Amer: 31 mL/min — ABNORMAL LOW (ref >90)
GFR, EST NON AFRICAN AMERICAN: 27 mL/min — AB (ref >90)
Glucose, Bld: 76 mg/dL (ref 70–99)
Potassium: 3.6 mEq/L — ABNORMAL LOW (ref 3.7–5.3)
SODIUM: 136 meq/L — AB (ref 137–147)

## 2014-04-17 NOTE — Patient Instructions (Signed)
Continue to bring all your medications to your visits.  Follow up in 3 weeks.  Start weighing daily.   Do the following things EVERYDAY: 1) Weigh yourself in the morning before breakfast. Write it down and keep it in a log. 2) Take your medicines as prescribed 3) Eat low salt foods-Limit salt (sodium) to 2000 mg per day.  4) Stay as active as you can everyday 5) Limit all fluids for the day to less than 2 liters 6)

## 2014-04-17 NOTE — Progress Notes (Signed)
Patient ID: [name], male   DOB: 06/11/1932, 80 y.o.   MRN: [id number]  Primary Physician:Dr Kilpatrick  Primary Cardiologist: Dr. Haroldine Laws  Followed at [facility]: Dr Lake Bells [phone])  HPI: [name] is a 80 yo with history of HTN, CKD, DM and chronic diastolic HF (primarily R side).   RHC: 11/16/12  RA mean 19  RV 53/16  PA 56/23, mean 38  PCWP mean 21  Oxygen saturations:  PA 53%  AO 94%  Cardiac Output (Fick) 5.05  Cardiac Index (Fick) 2.36  PVR 3.36 WU   Admitted to [facility] in 10/2012 with volume overload. He required Lasix drip and Milrinone. Renal Ultrasound negative for hydronephrosis. Large bilateral renal cysts with normal kidney size. Discharge weight- 243 pounds. Creatinine 2.22   Admitted to [facility] 10/23/13 with LLE numbness. He was evaluated for possible CVA but CT and MRI was negative.  Presumed TIA.   Follow up for Heart Failure: Since last visit has been admitted once for acute respiratory failure with hypoxia and PNA and seen in the ED for diarrhea. Very difficult situation patient has not allowed EMS in house recently and he has duplicates of medications and is not very sure what he is taking. He brought medications but has no clue what he is taking. Reports he has a nurse that comes three times a week. Denies SOB, orthopnea or CP. Having chronic bilateral lower leg pain. Is in a wheelchair. Does not weigh daily. Still having diarrhea.   Labs 07/29/13 K 3.3 Creatinine 1.66  Labs 10/05/12: K 4.8, Creatinine 2.27, pro-BNP 1056 Labs 10/24/13 K 4.2 Creatinine 1.5  Labs 12/03/13: K 3.7 and creatinine 1.98  ECHO 09/2013: EF 55-60%, mod Roy, RV sys fx nl.   SH: Lives by himself. Son and grandsons come check on every once in a while.   ROS: All systems negative except as listed in HPI, PMH and Problem List.  Past Medical History  Diagnosis Date  . Pneumonia   . Hypertension   . Hypercholesteremia   . Diabetes mellitus   . Gout   . Irregular heart beat   . Kidney  stone   . Lymphoma     s/p partial gastrectomy  . CHF (congestive heart failure)     Current Outpatient Prescriptions  Medication Sig Dispense Refill  . acetaminophen (TYLENOL) 325 MG tablet Take 650 mg by mouth every 6 (six) hours as needed.      Marland Kitchen allopurinol (ZYLOPRIM) 100 MG tablet Take 100 mg by mouth daily.      Marland Kitchen aspirin EC 81 MG tablet Take 1 tablet (81 mg total) by mouth daily.  30 tablet  3  . carboxymethylcellulose 1 % ophthalmic solution Apply 1 drop to eye 4 (four) times daily.      . carvedilol (COREG) 6.25 MG tablet Take 1 tablet (6.25 mg total) by mouth 2 (two) times daily with a meal.  60 tablet  0  . ferrous sulfate 325 (65 FE) MG tablet Take 325 mg by mouth 2 (two) times daily with a meal.      . gabapentin (NEURONTIN) 100 MG capsule Take 100 mg by mouth 2 (two) times daily.      Marland Kitchen glipiZIDE (GLUCOTROL) 5 MG tablet Take 5 mg by mouth daily before breakfast.      . hydrALAZINE (APRESOLINE) 50 MG tablet Take 50 mg by mouth 3 (three) times daily.      Marland Kitchen loperamide (IMODIUM) 2 MG capsule Take 1 capsule (2 mg  total) by mouth 4 (four) times daily as needed for diarrhea or loose stools.  12 capsule  0  . mirtazapine (REMERON) 15 MG tablet Take 7.5 mg by mouth at bedtime.      . niacin (SLO-NIACIN) 500 MG tablet Take 500 mg by mouth at bedtime.      . psyllium (METAMUCIL) 58.6 % powder Take 1 packet by mouth 3 (three) times daily.      Marland Kitchen senna-docusate (SENOKOT-S) 8.6-50 MG per tablet Take 2 tablets by mouth 2 (two) times daily as needed for mild constipation.      . sertraline (ZOLOFT) 100 MG tablet Take 200 mg by mouth at bedtime.       . simvastatin (ZOCOR) 20 MG tablet Take 10 mg by mouth daily.      . sucralfate (CARAFATE) 1 G tablet Take 1 tablet (1 g total) by mouth 4 (four) times daily -  with meals and at bedtime.  120 tablet  0  . torsemide (DEMADEX) 20 MG tablet Take 40 mg by mouth daily.       . vitamin B-12 (CYANOCOBALAMIN) 1000 MCG tablet Take 1,000 mcg by mouth  daily.      . potassium chloride SA (K-DUR,KLOR-CON) 20 MEQ tablet Take 20 mEq by mouth daily.      . traZODone (DESYREL) 50 MG tablet Take 50 mg by mouth at bedtime.       No current facility-administered medications for this encounter.   Filed Vitals:   04/17/14 1145  BP: 119/64  Pulse: 66  Resp: 18  Weight: 190 lb 4 oz (86.297 kg)  SpO2: 96%    PHYSICAL EXAM  General: Elderly. NAD; in wheelchair Neck: JVP 7 no thyromegaly or thyroid nodule.  Lungs: CTA CV: Nondisplaced PMI. Heart regular S1/S2, increased P2, no P1/W2, 2/6 systolic murmur LLSB. No carotid bruit.  Abdomen: Nontender. Large scar. +large ventral hernia Neurologic: Alert and oriented x 3. Mild dysarthria Psych: Normal affect.  Extremities: No clubbing or cyanosis. RLE and LLE 1-2+  Edema, compression stockings intact.     ASSESSMENT & PLAN:  1) Chronic diastolic CHF: Primarily right side, EF 55-60% (09/2013) - NYHA II symptoms and volume status mildly elevated. Very difficult situation patient brought medications, however is not really sure how to take them and bottles had same medications with different instructions. Meds from [place] list does not match our list. Will continue torsemide 40 mg daily. Check BMET. Start potassium 20 meq daily.  - SBP stable.  - Patient likely needs to be in nursing home concerned that he has some cognitive deficits and can't remember to take medications as prescribed. He is not interested currently though in SNF. Reports his son and grandson live with him. Tried to contact son to talk to him about concerns.  - Patient followed by [facility] and will place call to see if they have another contact person at [facility] system. [name] from paramedicine called as well and she reports patient has not allowed her to visit recently.  - Reinforced the need and importance of daily weights, a low sodium diet, and fluid restriction (less than 2 L a day). Instructed to call the HF clinic if weight increases more than  3 lbs overnight or 5 lbs in a week.  2) HTN:  - Stable. As above will wait to get dosages of medications.  3) CKD stage III - baseline Cr 1.6-2.0. Will check today. He reports he follows up with a renal MD but does not  know their name.  4) Bilateral LE wounds: - Pt reports he has wounds on LE legs. They are wrapped in [place] boots and he reports he is going to [place] on Thursday for follow up. There is an odor. As above will place call to try and get [place] nurse on phone and/or [place].   Follow up 3 weeks  Spent over 40 minutes with 1/2 of that time spent discussing the above and trying to coordinate care.  [name] B NP-C 12:03 PM

## 2014-04-27 ENCOUNTER — Inpatient Hospital Stay (HOSPITAL_COMMUNITY)
Admission: EM | Admit: 2014-04-27 | Discharge: 2014-06-01 | DRG: 871 | Disposition: E | Payer: Non-veteran care | Attending: Internal Medicine | Admitting: Internal Medicine

## 2014-04-27 ENCOUNTER — Emergency Department (HOSPITAL_COMMUNITY): Payer: Non-veteran care

## 2014-04-27 ENCOUNTER — Encounter (HOSPITAL_COMMUNITY): Payer: Self-pay | Admitting: Emergency Medicine

## 2014-04-27 DIAGNOSIS — N185 Chronic kidney disease, stage 5: Secondary | ICD-10-CM | POA: Diagnosis present

## 2014-04-27 DIAGNOSIS — E871 Hypo-osmolality and hyponatremia: Secondary | ICD-10-CM | POA: Diagnosis present

## 2014-04-27 DIAGNOSIS — R7989 Other specified abnormal findings of blood chemistry: Secondary | ICD-10-CM

## 2014-04-27 DIAGNOSIS — G894 Chronic pain syndrome: Secondary | ICD-10-CM | POA: Diagnosis present

## 2014-04-27 DIAGNOSIS — R1310 Dysphagia, unspecified: Secondary | ICD-10-CM | POA: Diagnosis present

## 2014-04-27 DIAGNOSIS — I4891 Unspecified atrial fibrillation: Secondary | ICD-10-CM

## 2014-04-27 DIAGNOSIS — R7402 Elevation of levels of lactic acid dehydrogenase (LDH): Secondary | ICD-10-CM | POA: Diagnosis present

## 2014-04-27 DIAGNOSIS — I32 Pericarditis in diseases classified elsewhere: Secondary | ICD-10-CM | POA: Diagnosis present

## 2014-04-27 DIAGNOSIS — K7460 Unspecified cirrhosis of liver: Secondary | ICD-10-CM | POA: Diagnosis present

## 2014-04-27 DIAGNOSIS — J189 Pneumonia, unspecified organism: Secondary | ICD-10-CM | POA: Diagnosis present

## 2014-04-27 DIAGNOSIS — I872 Venous insufficiency (chronic) (peripheral): Secondary | ICD-10-CM | POA: Diagnosis present

## 2014-04-27 DIAGNOSIS — E43 Unspecified severe protein-calorie malnutrition: Secondary | ICD-10-CM | POA: Diagnosis present

## 2014-04-27 DIAGNOSIS — F0391 Unspecified dementia with behavioral disturbance: Secondary | ICD-10-CM

## 2014-04-27 DIAGNOSIS — N179 Acute kidney failure, unspecified: Secondary | ICD-10-CM

## 2014-04-27 DIAGNOSIS — F03918 Unspecified dementia, unspecified severity, with other behavioral disturbance: Secondary | ICD-10-CM

## 2014-04-27 DIAGNOSIS — Z515 Encounter for palliative care: Secondary | ICD-10-CM | POA: Diagnosis not present

## 2014-04-27 DIAGNOSIS — I272 Pulmonary hypertension, unspecified: Secondary | ICD-10-CM

## 2014-04-27 DIAGNOSIS — J15211 Pneumonia due to Methicillin susceptible Staphylococcus aureus: Secondary | ICD-10-CM | POA: Diagnosis present

## 2014-04-27 DIAGNOSIS — K220 Achalasia of cardia: Secondary | ICD-10-CM | POA: Diagnosis present

## 2014-04-27 DIAGNOSIS — E872 Acidosis, unspecified: Secondary | ICD-10-CM | POA: Diagnosis present

## 2014-04-27 DIAGNOSIS — Z903 Acquired absence of stomach [part of]: Secondary | ICD-10-CM | POA: Diagnosis not present

## 2014-04-27 DIAGNOSIS — I10 Essential (primary) hypertension: Secondary | ICD-10-CM | POA: Diagnosis present

## 2014-04-27 DIAGNOSIS — T68XXXA Hypothermia, initial encounter: Secondary | ICD-10-CM | POA: Diagnosis not present

## 2014-04-27 DIAGNOSIS — I83019 Varicose veins of right lower extremity with ulcer of unspecified site: Secondary | ICD-10-CM

## 2014-04-27 DIAGNOSIS — I5022 Chronic systolic (congestive) heart failure: Secondary | ICD-10-CM | POA: Diagnosis present

## 2014-04-27 DIAGNOSIS — I509 Heart failure, unspecified: Secondary | ICD-10-CM | POA: Diagnosis present

## 2014-04-27 DIAGNOSIS — J9600 Acute respiratory failure, unspecified whether with hypoxia or hypercapnia: Secondary | ICD-10-CM | POA: Diagnosis present

## 2014-04-27 DIAGNOSIS — IMO0002 Reserved for concepts with insufficient information to code with codable children: Secondary | ICD-10-CM | POA: Diagnosis not present

## 2014-04-27 DIAGNOSIS — I2789 Other specified pulmonary heart diseases: Secondary | ICD-10-CM | POA: Diagnosis present

## 2014-04-27 DIAGNOSIS — R740 Nonspecific elevation of levels of transaminase and lactic acid dehydrogenase [LDH]: Secondary | ICD-10-CM

## 2014-04-27 DIAGNOSIS — J690 Pneumonitis due to inhalation of food and vomit: Secondary | ICD-10-CM | POA: Diagnosis present

## 2014-04-27 DIAGNOSIS — M109 Gout, unspecified: Secondary | ICD-10-CM | POA: Diagnosis present

## 2014-04-27 DIAGNOSIS — I83029 Varicose veins of left lower extremity with ulcer of unspecified site: Secondary | ICD-10-CM

## 2014-04-27 DIAGNOSIS — R6520 Severe sepsis without septic shock: Secondary | ICD-10-CM

## 2014-04-27 DIAGNOSIS — I5033 Acute on chronic diastolic (congestive) heart failure: Secondary | ICD-10-CM

## 2014-04-27 DIAGNOSIS — L97919 Non-pressure chronic ulcer of unspecified part of right lower leg with unspecified severity: Secondary | ICD-10-CM

## 2014-04-27 DIAGNOSIS — I50810 Right heart failure, unspecified: Secondary | ICD-10-CM | POA: Diagnosis present

## 2014-04-27 DIAGNOSIS — Z7982 Long term (current) use of aspirin: Secondary | ICD-10-CM

## 2014-04-27 DIAGNOSIS — I480 Paroxysmal atrial fibrillation: Secondary | ICD-10-CM

## 2014-04-27 DIAGNOSIS — E119 Type 2 diabetes mellitus without complications: Secondary | ICD-10-CM

## 2014-04-27 DIAGNOSIS — K7200 Acute and subacute hepatic failure without coma: Secondary | ICD-10-CM | POA: Diagnosis present

## 2014-04-27 DIAGNOSIS — F0390 Unspecified dementia without behavioral disturbance: Secondary | ICD-10-CM | POA: Diagnosis present

## 2014-04-27 DIAGNOSIS — N189 Chronic kidney disease, unspecified: Secondary | ICD-10-CM

## 2014-04-27 DIAGNOSIS — I5082 Biventricular heart failure: Secondary | ICD-10-CM

## 2014-04-27 DIAGNOSIS — I120 Hypertensive chronic kidney disease with stage 5 chronic kidney disease or end stage renal disease: Secondary | ICD-10-CM | POA: Diagnosis present

## 2014-04-27 DIAGNOSIS — A419 Sepsis, unspecified organism: Principal | ICD-10-CM

## 2014-04-27 DIAGNOSIS — L97909 Non-pressure chronic ulcer of unspecified part of unspecified lower leg with unspecified severity: Secondary | ICD-10-CM | POA: Diagnosis present

## 2014-04-27 DIAGNOSIS — J9601 Acute respiratory failure with hypoxia: Secondary | ICD-10-CM

## 2014-04-27 DIAGNOSIS — L97929 Non-pressure chronic ulcer of unspecified part of left lower leg with unspecified severity: Secondary | ICD-10-CM

## 2014-04-27 DIAGNOSIS — R945 Abnormal results of liver function studies: Secondary | ICD-10-CM

## 2014-04-27 DIAGNOSIS — R7401 Elevation of levels of liver transaminase levels: Secondary | ICD-10-CM

## 2014-04-27 LAB — URINALYSIS, ROUTINE W REFLEX MICROSCOPIC
GLUCOSE, UA: NEGATIVE mg/dL
Hgb urine dipstick: NEGATIVE
Ketones, ur: NEGATIVE mg/dL
Nitrite: NEGATIVE
Protein, ur: 100 mg/dL — AB
SPECIFIC GRAVITY, URINE: 1.02 (ref 1.005–1.030)
Urobilinogen, UA: 1 mg/dL (ref 0.0–1.0)
pH: 5 (ref 5.0–8.0)

## 2014-04-27 LAB — CBC WITH DIFFERENTIAL/PLATELET
Basophils Absolute: 0 10*3/uL (ref 0.0–0.1)
Basophils Relative: 0 % (ref 0–1)
EOS ABS: 0 10*3/uL (ref 0.0–0.7)
Eosinophils Relative: 0 % (ref 0–5)
HCT: 37.9 % — ABNORMAL LOW (ref 39.0–52.0)
HEMOGLOBIN: 12.9 g/dL — AB (ref 13.0–17.0)
LYMPHS ABS: 0.5 10*3/uL — AB (ref 0.7–4.0)
Lymphocytes Relative: 3 % — ABNORMAL LOW (ref 12–46)
MCH: 30.6 pg (ref 26.0–34.0)
MCHC: 34 g/dL (ref 30.0–36.0)
MCV: 90 fL (ref 78.0–100.0)
MONOS PCT: 3 % (ref 3–12)
Monocytes Absolute: 0.5 10*3/uL (ref 0.1–1.0)
NEUTROS ABS: 14.4 10*3/uL — AB (ref 1.7–7.7)
Neutrophils Relative %: 94 % — ABNORMAL HIGH (ref 43–77)
PLATELETS: 234 10*3/uL (ref 150–400)
RBC: 4.21 MIL/uL — AB (ref 4.22–5.81)
RDW: 15.8 % — ABNORMAL HIGH (ref 11.5–15.5)
WBC: 15.4 10*3/uL — AB (ref 4.0–10.5)

## 2014-04-27 LAB — COMPREHENSIVE METABOLIC PANEL
ALBUMIN: 2.6 g/dL — AB (ref 3.5–5.2)
ALK PHOS: 144 U/L — AB (ref 39–117)
ALT: 66 U/L — AB (ref 0–53)
AST: 223 U/L — ABNORMAL HIGH (ref 0–37)
Anion gap: 21 — ABNORMAL HIGH (ref 5–15)
BUN: 73 mg/dL — AB (ref 6–23)
CHLORIDE: 93 meq/L — AB (ref 96–112)
CO2: 16 mEq/L — ABNORMAL LOW (ref 19–32)
Calcium: 9.5 mg/dL (ref 8.4–10.5)
Creatinine, Ser: 3.04 mg/dL — ABNORMAL HIGH (ref 0.50–1.35)
GFR calc non Af Amer: 18 mL/min — ABNORMAL LOW (ref >90)
GFR, EST AFRICAN AMERICAN: 21 mL/min — AB (ref >90)
GLUCOSE: 144 mg/dL — AB (ref 70–99)
POTASSIUM: 4.5 meq/L (ref 3.7–5.3)
Sodium: 130 mEq/L — ABNORMAL LOW (ref 137–147)
TOTAL PROTEIN: 7.6 g/dL (ref 6.0–8.3)
Total Bilirubin: 0.7 mg/dL (ref 0.3–1.2)

## 2014-04-27 LAB — I-STAT CG4 LACTIC ACID, ED: Lactic Acid, Venous: 1.43 mmol/L (ref 0.5–2.2)

## 2014-04-27 LAB — I-STAT CHEM 8, ED
BUN: 69 mg/dL — ABNORMAL HIGH (ref 6–23)
CHLORIDE: 102 meq/L (ref 96–112)
CREATININE: 3.2 mg/dL — AB (ref 0.50–1.35)
Calcium, Ion: 1.2 mmol/L (ref 1.13–1.30)
Glucose, Bld: 144 mg/dL — ABNORMAL HIGH (ref 70–99)
HCT: 45 % (ref 39.0–52.0)
Hemoglobin: 15.3 g/dL (ref 13.0–17.0)
Potassium: 4.4 mEq/L (ref 3.7–5.3)
Sodium: 131 mEq/L — ABNORMAL LOW (ref 137–147)
TCO2: 19 mmol/L (ref 0–100)

## 2014-04-27 LAB — URINE MICROSCOPIC-ADD ON

## 2014-04-27 LAB — LIPASE, BLOOD: LIPASE: 61 U/L — AB (ref 11–59)

## 2014-04-27 LAB — I-STAT TROPONIN, ED: TROPONIN I, POC: 0.02 ng/mL (ref 0.00–0.08)

## 2014-04-27 LAB — PRO B NATRIURETIC PEPTIDE: PRO B NATRI PEPTIDE: 21756 pg/mL — AB (ref 0–450)

## 2014-04-27 MED ORDER — PIPERACILLIN-TAZOBACTAM 3.375 G IVPB
3.3750 g | Freq: Three times a day (TID) | INTRAVENOUS | Status: DC
  Administered 2014-04-28 – 2014-04-30 (×7): 3.375 g via INTRAVENOUS
  Filled 2014-04-27 (×9): qty 50

## 2014-04-27 MED ORDER — INSULIN ASPART 100 UNIT/ML ~~LOC~~ SOLN
0.0000 [IU] | Freq: Four times a day (QID) | SUBCUTANEOUS | Status: DC
  Administered 2014-04-28: 3 [IU] via SUBCUTANEOUS
  Administered 2014-04-29 – 2014-04-30 (×4): 2 [IU] via SUBCUTANEOUS
  Administered 2014-04-30: 3 [IU] via SUBCUTANEOUS
  Administered 2014-04-30: 2 [IU] via SUBCUTANEOUS
  Administered 2014-04-30: 3 [IU] via SUBCUTANEOUS
  Administered 2014-05-01: 2 [IU] via SUBCUTANEOUS
  Administered 2014-05-01 (×2): 3 [IU] via SUBCUTANEOUS
  Administered 2014-05-02: 2 [IU] via SUBCUTANEOUS

## 2014-04-27 MED ORDER — SODIUM CHLORIDE 0.9 % IV BOLUS (SEPSIS)
1000.0000 mL | Freq: Once | INTRAVENOUS | Status: AC
  Administered 2014-04-27: 1000 mL via INTRAVENOUS

## 2014-04-27 MED ORDER — SODIUM CHLORIDE 0.9 % IV SOLN
INTRAVENOUS | Status: DC
  Administered 2014-04-28 – 2014-04-29 (×3): via INTRAVENOUS

## 2014-04-27 MED ORDER — HEPARIN SODIUM (PORCINE) 5000 UNIT/ML IJ SOLN
5000.0000 [IU] | Freq: Three times a day (TID) | INTRAMUSCULAR | Status: DC
  Administered 2014-04-28 – 2014-05-03 (×17): 5000 [IU] via SUBCUTANEOUS
  Filled 2014-04-27 (×21): qty 1

## 2014-04-27 MED ORDER — PIPERACILLIN-TAZOBACTAM 3.375 G IVPB 30 MIN
3.3750 g | Freq: Once | INTRAVENOUS | Status: AC
  Administered 2014-04-27: 3.375 g via INTRAVENOUS
  Filled 2014-04-27: qty 50

## 2014-04-27 MED ORDER — VANCOMYCIN HCL IN DEXTROSE 1-5 GM/200ML-% IV SOLN
1000.0000 mg | Freq: Once | INTRAVENOUS | Status: AC
  Administered 2014-04-27: 1000 mg via INTRAVENOUS
  Filled 2014-04-27: qty 200

## 2014-04-27 NOTE — Consult Note (Signed)
ANTIBIOTIC CONSULT NOTE - INITIAL  Pharmacy Consult for Vancomycin and Zosyn Indication: pneumonia  No Known Allergies  Patient Measurements: Height: 6' 3.2" (191 cm) Weight: 190 lb 4.1 oz (86.3 kg) IBW/kg (Calculated) : 84.95  Vital Signs: Temp: 98.7 F (37.1 C) (08/27 1709) Temp src: Rectal (08/27 1709) BP: 116/53 mmHg (08/27 2004) Pulse Rate: 57 (08/27 1930) Intake/Output from previous day:   Intake/Output from this shift:    Labs:  Recent Labs  04/24/2014 1708 04/04/2014 1730  WBC 15.4*  --   HGB 12.9* 15.3  PLT 234  --   CREATININE 3.04* 3.20*   Estimated Creatinine Clearance: 22.1 ml/min (by C-G formula based on Cr of 3.2).  Microbiology: No results found for this or any previous visit (from the past 720 hour(s)).  Medical History: Past Medical History  Diagnosis Date  . Pneumonia   . Hypertension   . Hypercholesteremia   . Diabetes mellitus   . Gout   . Irregular heart beat   . Kidney stone   . Lymphoma     s/p partial gastrectomy  . CHF (congestive heart failure)    Assessment: [name] recently hospitalized (7/11-7/16) for acute respiratory failure 2/2 aflutter/CHF returns to the ED today with weakness and abdominal pain. Abdominal xray shows increased opacity in the left mid and lower lung zones suspicious for pneumonia. He will begin vancomycin and zosyn for HCAP. He has CKD but appears to be in acute renal failure with sCR 3.2 (baseline somewhere between 1.5-1.9).   Goal of Therapy:  Vancomycin trough level 15-20 mcg/ml  Plan:  1) Vancomycin 1000mg  IV x 1 2) Zosyn 3.375g IV q8 (4 hour infusion) 3) Follow up sCr trend for further vancomycin dosing  Deboraha Sprang 04/19/2014,9:13 PM

## 2014-04-27 NOTE — H&P (Signed)
[facility] History and Physical  Patient: [name]  FGB:[id number]  DOB: 1932/07/08  DOS: the patient was seen and examined on 04/14/2014 PCP: Leola Brazil, MD  Chief Complaint: Generalized weakness with congestion  HPI: [name] is a 80 y.o. male with Past medical history of hypertension, diabetes, chronic right-sided heart failure, recurrent aspiration pneumonia, possible achalasia, chronic venous insufficiency, chronic kidney disease. The patient presented with complaints of generalized weakness and congestion. At the time of my evaluation patient continues she repeatedly say "he is feeling cold"other than that he did not have any other complaint. History was obtained from ED documentation. Multiple attempt to reach family was unsuccessful. Reportedly family called EMS as the patient was having progressively worsening weakness. He was reported to have condition for a while. Patient also reported to ER physician that he has been hurting all over. Patient denied any abdominal pain to me no nausea no vomiting.  The patient is coming from home. And at his baseline dependent for most of his ADL.  Review of Systems: as mentioned in the history of present illness.  A Comprehensive review of the other systems is negative.  Past Medical History  Diagnosis Date  . Pneumonia   . Hypertension   . Hypercholesteremia   . Diabetes mellitus   . Gout   . Irregular heart beat   . Kidney stone   . Lymphoma     s/p partial gastrectomy  . CHF (congestive heart failure)    Past Surgical History  Procedure Laterality Date  . Partial gastrectomy  1994    for lymphoma  . Hernia repair    . Joint replacement    . Back surgery    . Esophagogastroduodenoscopy N/A 10/28/2012    Procedure: ESOPHAGOGASTRODUODENOSCOPY (EGD);  Surgeon: Lear Ng, MD;  Location: [facility];  Service: Endoscopy;  Laterality: N/A;  . Esophagogastroduodenoscopy Left 03/11/2014     Procedure: ESOPHAGOGASTRODUODENOSCOPY (EGD);  Surgeon: Arta Silence, MD;  Location: [facility];  Service: Endoscopy;  Laterality: Left;   Social History:  reports that he has never smoked. He has never used smokeless tobacco. He reports that he does not drink alcohol or use illicit drugs.  No Known Allergies  No family history on file.  Prior to Admission medications   Medication Sig Start Date End Date Taking? Authorizing Provider  acetaminophen (TYLENOL) 325 MG tablet Take 650 mg by mouth every 6 (six) hours as needed.    Historical Provider, MD  allopurinol (ZYLOPRIM) 100 MG tablet Take 100 mg by mouth daily.    Historical Provider, MD  amoxicillin-clavulanate (AUGMENTIN) 250-62.5 MG/5ML suspension  03/16/14   Historical Provider, MD  aspirin EC 81 MG tablet Take 1 tablet (81 mg total) by mouth daily. 10/19/13   Jolaine Artist, MD  carboxymethylcellulose 1 % ophthalmic solution Apply 1 drop to eye 4 (four) times daily.    Historical Provider, MD  carvedilol (COREG) 6.25 MG tablet Take 1 tablet (6.25 mg total) by mouth 2 (two) times daily with a meal. 03/16/14   Janece Canterbury, MD  ferrous sulfate 325 (65 FE) MG tablet Take 325 mg by mouth 2 (two) times daily with a meal.    Historical Provider, MD  gabapentin (NEURONTIN) 100 MG capsule Take 100 mg by mouth 2 (two) times daily.    Historical Provider, MD  glipiZIDE (GLUCOTROL) 5 MG tablet Take 5 mg by mouth daily before breakfast.    Historical Provider, MD  hydrALAZINE (APRESOLINE) 50 MG tablet  Take 50 mg by mouth 3 (three) times daily.    Historical Provider, MD  loperamide (IMODIUM) 2 MG capsule Take 1 capsule (2 mg total) by mouth 4 (four) times daily as needed for diarrhea or loose stools. 04/14/14   Domenic Moras, PA-C  mirtazapine (REMERON) 15 MG tablet Take 7.5 mg by mouth at bedtime.    Historical Provider, MD  niacin (SLO-NIACIN) 500 MG tablet Take 500 mg by mouth at bedtime.    Historical Provider, MD  potassium chloride SA  (K-DUR,KLOR-CON) 20 MEQ tablet Take 20 mEq by mouth daily. 03/08/14   Historical Provider, MD  psyllium (METAMUCIL) 58.6 % powder Take 1 packet by mouth 3 (three) times daily.    Historical Provider, MD  senna-docusate (SENOKOT-S) 8.6-50 MG per tablet Take 2 tablets by mouth 2 (two) times daily as needed for mild constipation.    Historical Provider, MD  sertraline (ZOLOFT) 100 MG tablet Take 200 mg by mouth at bedtime.     Historical Provider, MD  simvastatin (ZOCOR) 20 MG tablet Take 10 mg by mouth daily.    Historical Provider, MD  sucralfate (CARAFATE) 1 G tablet Take 1 tablet (1 g total) by mouth 4 (four) times daily -  with meals and at bedtime. 03/16/14   Janece Canterbury, MD  torsemide (DEMADEX) 20 MG tablet Take 40 mg by mouth daily.     Historical Provider, MD  traZODone (DESYREL) 50 MG tablet Take 50 mg by mouth at bedtime.    Historical Provider, MD  vitamin B-12 (CYANOCOBALAMIN) 1000 MCG tablet Take 1,000 mcg by mouth daily.    Historical Provider, MD    Physical Exam: Filed Vitals:   04/25/2014 2245 04/21/2014 2300 04/26/2014 2315 04/16/2014 2330  BP: 91/45 107/49 105/46 98/44  Pulse: 63 68 64 70  Temp:      TempSrc:      Resp: 16 16 15 14   Height:      Weight:      SpO2: 93% 91% 90% 91%    General: Alert, Awake and disoriented, follows command, Appear in moderate distress Eyes: PERRL ENT: Oral Mucosa green secretion. Neck: Difficult to assess JVD Cardiovascular: S1 and S2 Present, aortic systolic Murmur, Peripheral Pulses Present Respiratory: Bilateral Air entry equal and Decreased, bilateral Crackles, no wheezes Abdomen: Bowel Sound Present, Soft and Non tender Skin: No Rash Extremities: Chronic venous stasis ulcer with foul-smelling, bilateral Pedal edema, no calf tenderness Neurologic: Grossly no focal neuro deficit.  Labs on Admission:  CBC:  Recent Labs Lab 04/12/2014 1708 04/19/2014 1730  WBC 15.4*  --   NEUTROABS 14.4*  --   HGB 12.9* 15.3  HCT 37.9* 45.0  MCV 90.0   --   PLT 234  --     CMP     Component Value Date/Time   NA 131* 04/05/2014 1730   K 4.4 04/25/2014 1730   CL 102 05/01/2014 1730   CO2 16* 04/16/2014 1708   GLUCOSE 144* 04/12/2014 1730   BUN 69* 04/28/2014 1730   CREATININE 3.20* 04/28/2014 1730   CALCIUM 9.5 04/05/2014 1708   PROT 7.6 04/30/2014 1708   ALBUMIN 2.6* 04/01/2014 1708   AST 223* 04/10/2014 1708   ALT 66* 04/06/2014 1708   ALKPHOS 144* 04/21/2014 1708   BILITOT 0.7 04/07/2014 1708   GFRNONAA 18* 04/12/2014 1708   GFRAA 21* 04/30/2014 1708     Recent Labs Lab 04/28/2014 2113  LIPASE 61*   No results found for this basename: AMMONIA,  in the  last 168 hours  No results found for this basename: CKTOTAL, CKMB, CKMBINDEX, TROPONINI,  in the last 168 hours BNP (last 3 results)  Recent Labs  11/27/13 1518 04/13/14 2203 04/06/2014 1708  PROBNP 740.1* 7348.0* 21756.0*    Radiological Exams on Admission: Ct Abdomen Pelvis Wo Contrast  04/01/2014   CLINICAL DATA:  Weakness and congestion.  Productive cough.  EXAM: CT ABDOMEN AND PELVIS WITHOUT CONTRAST  TECHNIQUE: Multidetector CT imaging of the abdomen and pelvis was performed following the standard protocol without IV contrast.  COMPARISON:  10/23/2013  FINDINGS: Visualized portions of the lung bases demonstrate diffuse patchy airspace consolidation in both lungs. This could indicate edema, aspiration, or pneumonia. There are small bilateral pleural effusions. Marked Coronary artery calcifications.  Visualization of solid organs and vascular structures is limited without IV contrast material. There are changes of hepatic cirrhosis with enlargement of the lateral segment left lobe and nodular contour. Calcified granulomas in the liver. Spleen size is normal. Gallbladder is contracted, likely physiologic. Probable small stone in the gallbladder. No bile duct dilatation. Pancreas is diffusely atrophic. No pancreatic ductal dilatation. Calcification in the right adrenal gland probably  represents old hemorrhage or infection. Multiple low-attenuation lesions throughout both kidneys likely representing cysts. No hydronephrosis in either kidney. Calcification of aorta without aneurysm. Inferior vena cava and retroperitoneal lymph nodes are unremarkable. Postoperative changes with mesh closure of a anterior abdominal wall hernia. Postoperative changes in the stomach. Stomach and small bowel are decompressed. Gas and stool in the colon without significant distention. No free air or free fluid in the abdomen.  Pelvis: Prostate gland is not enlarged. Vascular calcifications present. Bladder is decompressed. No free or loculated pelvic fluid collections. No pelvic mass or lymphadenopathy. Muscular atrophy in the gluteal and hip girdle muscles bilaterally. Diverticulosis of the sigmoid colon without evidence of diverticulitis. Prominent degenerative changes throughout the visualized spine. No destructive bone lesions are appreciated.  Compare with the previous study, the abdominal and pelvic findings are unchanged. The pulmonary infiltrates have increased.  IMPRESSION: Diffuse patchy airspace disease in the lungs with small pleural effusions suggesting edema, aspiration, or pneumonia.Stable appearance of chronic changes in the abdomen and pelvis, with evidence of hepatic cirrhosis, multiple renal cysts, pancreatic atrophy, postoperative abdominal hernia repair, and prominent degenerative changes in the spine.   Electronically Signed   By: Lucienne Capers M.D.   On: 04/30/2014 22:13   Dg Abd Acute W/chest  04/13/2014   CLINICAL DATA:  Abdominal pain, diarrhea in shortness of breath.  EXAM: ACUTE ABDOMEN SERIES (ABDOMEN 2 VIEW & CHEST 1 VIEW)  COMPARISON:  Chest dated 04/13/2014 and abdominal radiographs and CT dated 10/23/2013.  FINDINGS: The cardiac silhouette remains borderline enlarged. A small amount of patchy opacity at the right lung base has not changed significantly. Increased ill-defined  density in the left mid and lower lung zone. A right shoulder prosthesis is unchanged. Thoracic spine degenerative changes. Normal bowel gas pattern without free peritoneal air. Lumbar and thoracic spine degenerative changes.  IMPRESSION: 1. Increased opacity in the left mid and lower lung zones, suspicious for pneumonia. 2. No significant change in mild patchy atelectasis or scarring at the right lung base. Pneumonia is less likely. 3. Stable borderline cardiomegaly. 4. No acute abdominal abnormality.   Electronically Signed   By: Enrique Sack M.D.   On: 04/28/2014 20:06    Assessment/Plan Principal Problem:   HCAP (healthcare-associated pneumonia) Active Problems:   Diabetes mellitus   Right heart failure   Acute  on chronic congestive heart failure with right ventricular diastolic dysfunction   Aspiration pneumonia   Renal failure (ARF), acute on chronic   possible achalasia   Venous insufficiency (chronic) (peripheral)   Elevated LFTs   1. HCAP (healthcare-associated pneumonia) The patient is presenting with complaints of cough and congestion. Chest x-ray and CT abdomen shows patient has recurrence of hives associated pneumonia. He also appears to have worsening chronic venostasis ulcer with probable secondary infection. With leukocytosis elevated LFT and worsening serum creatinine the patient appears to have sepsis secondary to this pneumonia. With this the patient will be admitted to step down unit. We will give him IV hydration. IV vancomycin and Zosyn for coverage. I will keep him n.p.o. in view of his dysphagia and achalasia.  2. Acute on chronic congestive heart failure. Patient's proBNP appears elevated but although clinically he has severe sepsis with acute kidney injury with elevated BUN probably has been able. At present I would continue him on IV hydration and recheck his BMP. If there is improvement in his BMP then I would continue with the hydration. Monitor his respiratory  status. Daily weight and ins and outs. Cardiology consult morning.  3. Elevated LFT. Patient has elevated LFTs with AST to ALT ratio more than 2. Probable etiology is sepsis. Would avoid hepatotoxic medication. Monitor.  4. Chronic venostasis ulcer. Patient appears to have worsening chronic venostasis ulcer. We would be consulted wound care and continue with vancomycin and Zosyn to cover him broadly.  DVT Prophylaxis: subcutaneous Heparin Nutrition: N.p.o.  Code Status: Full presumed  Family Communication: Multiple attempts to reach family were unsuccessful Disposition: Admitted to inpatient in step-down unit.  Author: Berle Mull, MD [facility] Pager: [phone] 04/02/2014, 11:35 PM    If 7PM-7AM, please contact night-coverage [email or web address] Password [email or web address]  **Disclaimer: This note may have been dictated with voice recognition software. Similar sounding words can inadvertently be transcribed and this note may contain transcription errors which may not have been corrected upon publication of note.**

## 2014-04-27 NOTE — ED Notes (Signed)
Pt to ED via [facility] - pt at home with family, family reports pt has been very weak and congested for "a while".  Pt reports that he hurts all over, productive cough for the past month- seen at [facility] for the same.

## 2014-04-27 NOTE — ED Provider Notes (Signed)
CSN: [id number]     Arrival date & time 04/26/2014  1647 History   First MD Initiated Contact with Patient 04/04/2014 1701     Chief Complaint  Patient presents with  . Weakness     (Consider location/radiation/quality/duration/timing/severity/associated sxs/prior Treatment) HPI Comments: Patient presents to the ER by him but from home. The patient's family called EMS when he was noted to be more weak than usual. Patient has reportedly been "congested for a while". Upon arrival to the ER, patient is extremely weak. He reports that he hurts all over. He says that he has been coughing for about a month, has been seen at the [place]. He is reporting some pain in his abdominal area, no vomiting, has had some diarrhea.   Past Medical History  Diagnosis Date  . Pneumonia   . Hypertension   . Hypercholesteremia   . Diabetes mellitus   . Gout   . Irregular heart beat   . Kidney stone   . Lymphoma     s/p partial gastrectomy  . CHF (congestive heart failure)    Past Surgical History  Procedure Laterality Date  . Partial gastrectomy  1994    for lymphoma  . Hernia repair    . Joint replacement    . Back surgery    . Esophagogastroduodenoscopy N/A 10/28/2012    Procedure: ESOPHAGOGASTRODUODENOSCOPY (EGD);  Surgeon: Lear Ng, MD;  Location: [facility];  Service: Endoscopy;  Laterality: N/A;  . Esophagogastroduodenoscopy Left 03/11/2014    Procedure: ESOPHAGOGASTRODUODENOSCOPY (EGD);  Surgeon: Arta Silence, MD;  Location: [facility];  Service: Endoscopy;  Laterality: Left;   No family history on file. History  Substance Use Topics  . Smoking status: Never Smoker   . Smokeless tobacco: Never Used  . Alcohol Use: No    Review of Systems  Constitutional: Positive for fatigue.  Respiratory: Positive for cough and shortness of breath.   Gastrointestinal: Positive for abdominal pain and diarrhea.  All other systems reviewed and are negative.     Allergies  Review of  patient's allergies indicates no known allergies.  Home Medications   Prior to Admission medications   Medication Sig Start Date End Date Taking? Authorizing Provider  acetaminophen (TYLENOL) 325 MG tablet Take 650 mg by mouth every 6 (six) hours as needed.    Historical Provider, MD  allopurinol (ZYLOPRIM) 100 MG tablet Take 100 mg by mouth daily.    Historical Provider, MD  amoxicillin-clavulanate (AUGMENTIN) 250-62.5 MG/5ML suspension  03/16/14   Historical Provider, MD  aspirin EC 81 MG tablet Take 1 tablet (81 mg total) by mouth daily. 10/19/13   Jolaine Artist, MD  carboxymethylcellulose 1 % ophthalmic solution Apply 1 drop to eye 4 (four) times daily.    Historical Provider, MD  carvedilol (COREG) 6.25 MG tablet Take 1 tablet (6.25 mg total) by mouth 2 (two) times daily with a meal. 03/16/14   Janece Canterbury, MD  ferrous sulfate 325 (65 FE) MG tablet Take 325 mg by mouth 2 (two) times daily with a meal.    Historical Provider, MD  gabapentin (NEURONTIN) 100 MG capsule Take 100 mg by mouth 2 (two) times daily.    Historical Provider, MD  glipiZIDE (GLUCOTROL) 5 MG tablet Take 5 mg by mouth daily before breakfast.    Historical Provider, MD  hydrALAZINE (APRESOLINE) 50 MG tablet Take 50 mg by mouth 3 (three) times daily.    Historical Provider, MD  loperamide (IMODIUM) 2 MG capsule Take 1 capsule (2  mg total) by mouth 4 (four) times daily as needed for diarrhea or loose stools. 04/14/14   Domenic Moras, PA-C  mirtazapine (REMERON) 15 MG tablet Take 7.5 mg by mouth at bedtime.    Historical Provider, MD  niacin (SLO-NIACIN) 500 MG tablet Take 500 mg by mouth at bedtime.    Historical Provider, MD  potassium chloride SA (K-DUR,KLOR-CON) 20 MEQ tablet Take 20 mEq by mouth daily. 03/08/14   Historical Provider, MD  psyllium (METAMUCIL) 58.6 % powder Take 1 packet by mouth 3 (three) times daily.    Historical Provider, MD  senna-docusate (SENOKOT-S) 8.6-50 MG per tablet Take 2 tablets by mouth 2  (two) times daily as needed for mild constipation.    Historical Provider, MD  sertraline (ZOLOFT) 100 MG tablet Take 200 mg by mouth at bedtime.     Historical Provider, MD  simvastatin (ZOCOR) 20 MG tablet Take 10 mg by mouth daily.    Historical Provider, MD  sucralfate (CARAFATE) 1 G tablet Take 1 tablet (1 g total) by mouth 4 (four) times daily -  with meals and at bedtime. 03/16/14   Janece Canterbury, MD  torsemide (DEMADEX) 20 MG tablet Take 40 mg by mouth daily.     Historical Provider, MD  traZODone (DESYREL) 50 MG tablet Take 50 mg by mouth at bedtime.    Historical Provider, MD  vitamin B-12 (CYANOCOBALAMIN) 1000 MCG tablet Take 1,000 mcg by mouth daily.    Historical Provider, MD   BP 116/53  Pulse 78  Temp(Src) 98.7 F (37.1 C) (Rectal)  Resp 14  SpO2 98% Physical Exam  Constitutional: He is oriented to person, place, and time. He appears well-developed and well-nourished. No distress.  HENT:  Head: Normocephalic and atraumatic.  Right Ear: Hearing normal.  Left Ear: Hearing normal.  Nose: Nose normal.  Mouth/Throat: Oropharynx is clear and moist and mucous membranes are normal.  Eyes: Conjunctivae and EOM are normal. Pupils are equal, round, and reactive to light.  Neck: Normal range of motion. Neck supple.  Cardiovascular: Regular rhythm, S1 normal and S2 normal.  Exam reveals no gallop and no friction rub.   No murmur heard. Pulmonary/Chest: Effort normal. No respiratory distress. He has decreased breath sounds. He has rhonchi. He has rales. He exhibits no tenderness.  Abdominal: Soft. Normal appearance and bowel sounds are normal. There is no hepatosplenomegaly. There is no tenderness. There is no rebound, no guarding, no tenderness at McBurney's point and negative Murphy's sign. No hernia.  Musculoskeletal: Normal range of motion.  Neurological: He is alert and oriented to person, place, and time. He has normal strength. No cranial nerve deficit or sensory deficit.  Coordination normal. GCS eye subscore is 4. GCS verbal subscore is 5. GCS motor subscore is 6.  Skin: Skin is warm, dry and intact. No rash noted. No cyanosis.  Psychiatric: He has a normal mood and affect. His speech is normal and behavior is normal. Thought content normal.    ED Course  Procedures (including critical care time) Labs Review Labs Reviewed  CBC WITH DIFFERENTIAL - Abnormal; Notable for the following:    WBC 15.4 (*)    RBC 4.21 (*)    Hemoglobin 12.9 (*)    HCT 37.9 (*)    RDW 15.8 (*)    Neutrophils Relative % 94 (*)    Neutro Abs 14.4 (*)    Lymphocytes Relative 3 (*)    Lymphs Abs 0.5 (*)    All other components within normal  limits  COMPREHENSIVE METABOLIC PANEL - Abnormal; Notable for the following:    Sodium 130 (*)    Chloride 93 (*)    CO2 16 (*)    Glucose, Bld 144 (*)    BUN 73 (*)    Creatinine, Ser 3.04 (*)    Albumin 2.6 (*)    AST 223 (*)    ALT 66 (*)    Alkaline Phosphatase 144 (*)    GFR calc non Af Amer 18 (*)    GFR calc Af Amer 21 (*)    Anion gap 21 (*)    All other components within normal limits  PRO B NATRIURETIC PEPTIDE - Abnormal; Notable for the following:    Pro B Natriuretic peptide (BNP) 21756.0 (*)    All other components within normal limits  I-STAT CHEM 8, ED - Abnormal; Notable for the following:    Sodium 131 (*)    BUN 69 (*)    Creatinine, Ser 3.20 (*)    Glucose, Bld 144 (*)    All other components within normal limits  URINE CULTURE  CULTURE, BLOOD (ROUTINE X 2)  CULTURE, BLOOD (ROUTINE X 2)  URINALYSIS, ROUTINE W REFLEX MICROSCOPIC  LIPASE, BLOOD  I-STAT CG4 LACTIC ACID, ED  I-STAT TROPOININ, ED    Imaging Review Dg Abd Acute W/chest  04/11/2014   CLINICAL DATA:  Abdominal pain, diarrhea in shortness of breath.  EXAM: ACUTE ABDOMEN SERIES (ABDOMEN 2 VIEW & CHEST 1 VIEW)  COMPARISON:  Chest dated 04/13/2014 and abdominal radiographs and CT dated 10/23/2013.  FINDINGS: The cardiac silhouette remains  borderline enlarged. A small amount of patchy opacity at the right lung base has not changed significantly. Increased ill-defined density in the left mid and lower lung zone. A right shoulder prosthesis is unchanged. Thoracic spine degenerative changes. Normal bowel gas pattern without free peritoneal air. Lumbar and thoracic spine degenerative changes.  IMPRESSION: 1. Increased opacity in the left mid and lower lung zones, suspicious for pneumonia. 2. No significant change in mild patchy atelectasis or scarring at the right lung base. Pneumonia is less likely. 3. Stable borderline cardiomegaly. 4. No acute abdominal abnormality.   Electronically Signed   By: Enrique Sack M.D.   On: 04/07/2014 20:06     EKG Interpretation   Date/Time:  Thursday April 27 2014 17:05:32 EDT Ventricular Rate:  90 PR Interval:    QRS Duration: 153 QT Interval:  410 QTC Calculation: 502 R Axis:   81 Text Interpretation:  Atrial fibrillation Right bundle branch block No  significant change since last tracing Confirmed by Baxter Gonzalez  MD,  Chon Buhl [phone]) on 04/26/2014 6:13:05 PM      MDM   Final diagnoses:  None   aspiration pneumonia  Patient presented to the ER for evaluation of weakness with chest congestion. Patient was found to have significant, thick sputum and was having difficulty with his secretions. Reviewing his records reveals that he does have a history of aspiration pneumonia. Chest x-ray today shows evidence of pneumonia, suspect aspiration type. Patient initiated on empiric treatment.  Patient also complained of abdominal discomfort. Lab work revealed mild elevation of LFTs. Examination didn't reveal any focal right upper quadrant tenderness, however. CT scan was performed, no acute abnormality was seen. Patient does have evidence of cirrhosis, likely explaining the patient's LFT abnormalities.  Patient was hypotensive here in the ER. He was administered IV fluids with some improvement. She did,  however, have evidence of acute on chronic renal insufficiency with  elevated BNP. Fluid administration will need to be monitored. Patient will be admitted to step down unit.  Orpah Greek, MD 04/28/14 Laureen Abrahams

## 2014-04-27 NOTE — ED Notes (Signed)
Grandson: [phone]

## 2014-04-28 ENCOUNTER — Encounter (HOSPITAL_COMMUNITY): Payer: Self-pay | Admitting: *Deleted

## 2014-04-28 DIAGNOSIS — J9600 Acute respiratory failure, unspecified whether with hypoxia or hypercapnia: Secondary | ICD-10-CM

## 2014-04-28 DIAGNOSIS — E119 Type 2 diabetes mellitus without complications: Secondary | ICD-10-CM

## 2014-04-28 DIAGNOSIS — G894 Chronic pain syndrome: Secondary | ICD-10-CM

## 2014-04-28 DIAGNOSIS — I2789 Other specified pulmonary heart diseases: Secondary | ICD-10-CM

## 2014-04-28 DIAGNOSIS — R7989 Other specified abnormal findings of blood chemistry: Secondary | ICD-10-CM | POA: Diagnosis present

## 2014-04-28 DIAGNOSIS — I4891 Unspecified atrial fibrillation: Secondary | ICD-10-CM

## 2014-04-28 DIAGNOSIS — F03918 Unspecified dementia, unspecified severity, with other behavioral disturbance: Secondary | ICD-10-CM

## 2014-04-28 DIAGNOSIS — F0391 Unspecified dementia with behavioral disturbance: Secondary | ICD-10-CM

## 2014-04-28 DIAGNOSIS — J690 Pneumonitis due to inhalation of food and vomit: Secondary | ICD-10-CM

## 2014-04-28 DIAGNOSIS — I509 Heart failure, unspecified: Secondary | ICD-10-CM

## 2014-04-28 DIAGNOSIS — R945 Abnormal results of liver function studies: Secondary | ICD-10-CM

## 2014-04-28 DIAGNOSIS — N189 Chronic kidney disease, unspecified: Secondary | ICD-10-CM

## 2014-04-28 DIAGNOSIS — I5022 Chronic systolic (congestive) heart failure: Secondary | ICD-10-CM

## 2014-04-28 DIAGNOSIS — I872 Venous insufficiency (chronic) (peripheral): Secondary | ICD-10-CM

## 2014-04-28 DIAGNOSIS — L97909 Non-pressure chronic ulcer of unspecified part of unspecified lower leg with unspecified severity: Secondary | ICD-10-CM

## 2014-04-28 DIAGNOSIS — N179 Acute kidney failure, unspecified: Secondary | ICD-10-CM

## 2014-04-28 LAB — PROTIME-INR
INR: 1.24 (ref 0.00–1.49)
Prothrombin Time: 15.6 seconds — ABNORMAL HIGH (ref 11.6–15.2)

## 2014-04-28 LAB — COMPREHENSIVE METABOLIC PANEL
ALT: 62 U/L — AB (ref 0–53)
AST: 229 U/L — AB (ref 0–37)
Albumin: 2.1 g/dL — ABNORMAL LOW (ref 3.5–5.2)
Alkaline Phosphatase: 116 U/L (ref 39–117)
Anion gap: 14 (ref 5–15)
BILIRUBIN TOTAL: 0.5 mg/dL (ref 0.3–1.2)
BUN: 71 mg/dL — ABNORMAL HIGH (ref 6–23)
CO2: 18 meq/L — AB (ref 19–32)
Calcium: 8.5 mg/dL (ref 8.4–10.5)
Chloride: 101 mEq/L (ref 96–112)
Creatinine, Ser: 3.07 mg/dL — ABNORMAL HIGH (ref 0.50–1.35)
GFR calc Af Amer: 21 mL/min — ABNORMAL LOW (ref >90)
GFR, EST NON AFRICAN AMERICAN: 18 mL/min — AB (ref >90)
GLUCOSE: 95 mg/dL (ref 70–99)
Potassium: 3.9 mEq/L (ref 3.7–5.3)
SODIUM: 133 meq/L — AB (ref 137–147)
Total Protein: 6.3 g/dL (ref 6.0–8.3)

## 2014-04-28 LAB — CBC WITH DIFFERENTIAL/PLATELET
BASOS ABS: 0 10*3/uL (ref 0.0–0.1)
Basophils Relative: 0 % (ref 0–1)
Eosinophils Absolute: 0 10*3/uL (ref 0.0–0.7)
Eosinophils Relative: 0 % (ref 0–5)
HEMATOCRIT: 32.3 % — AB (ref 39.0–52.0)
Hemoglobin: 11 g/dL — ABNORMAL LOW (ref 13.0–17.0)
LYMPHS ABS: 1.1 10*3/uL (ref 0.7–4.0)
Lymphocytes Relative: 5 % — ABNORMAL LOW (ref 12–46)
MCH: 30.7 pg (ref 26.0–34.0)
MCHC: 34.1 g/dL (ref 30.0–36.0)
MCV: 90.2 fL (ref 78.0–100.0)
Monocytes Absolute: 1.1 10*3/uL — ABNORMAL HIGH (ref 0.1–1.0)
Monocytes Relative: 5 % (ref 3–12)
NEUTROS ABS: 20.6 10*3/uL — AB (ref 1.7–7.7)
Neutrophils Relative %: 90 % — ABNORMAL HIGH (ref 43–77)
Platelets: 218 10*3/uL (ref 150–400)
RBC: 3.58 MIL/uL — AB (ref 4.22–5.81)
RDW: 16 % — AB (ref 11.5–15.5)
WBC: 22.9 10*3/uL — ABNORMAL HIGH (ref 4.0–10.5)

## 2014-04-28 LAB — GLUCOSE, CAPILLARY
GLUCOSE-CAPILLARY: 164 mg/dL — AB (ref 70–99)
Glucose-Capillary: 104 mg/dL — ABNORMAL HIGH (ref 70–99)
Glucose-Capillary: 100 mg/dL — ABNORMAL HIGH (ref 70–99)
Glucose-Capillary: 98 mg/dL (ref 70–99)

## 2014-04-28 LAB — TSH: TSH: 1.32 u[IU]/mL (ref 0.350–4.500)

## 2014-04-28 LAB — STREP PNEUMONIAE URINARY ANTIGEN: STREP PNEUMO URINARY ANTIGEN: NEGATIVE

## 2014-04-28 LAB — MRSA PCR SCREENING: MRSA by PCR: NEGATIVE

## 2014-04-28 MED ORDER — MORPHINE SULFATE 2 MG/ML IJ SOLN
INTRAMUSCULAR | Status: AC
Start: 2014-04-28 — End: 2014-04-28
  Filled 2014-04-28: qty 1

## 2014-04-28 MED ORDER — MORPHINE SULFATE 2 MG/ML IJ SOLN
1.0000 mg | Freq: Once | INTRAMUSCULAR | Status: AC
  Administered 2014-04-28: 1 mg via INTRAVENOUS

## 2014-04-28 MED ORDER — CHLORHEXIDINE GLUCONATE 0.12 % MT SOLN
15.0000 mL | Freq: Two times a day (BID) | OROMUCOSAL | Status: DC
  Administered 2014-04-28 – 2014-05-03 (×11): 15 mL via OROMUCOSAL
  Filled 2014-04-28 (×13): qty 15

## 2014-04-28 MED ORDER — MORPHINE SULFATE 2 MG/ML IJ SOLN
1.0000 mg | INTRAMUSCULAR | Status: DC | PRN
  Administered 2014-04-28 – 2014-04-30 (×4): 1 mg via INTRAVENOUS
  Filled 2014-04-28 (×5): qty 1

## 2014-04-28 MED ORDER — VANCOMYCIN HCL IN DEXTROSE 750-5 MG/150ML-% IV SOLN
750.0000 mg | INTRAVENOUS | Status: DC
  Administered 2014-04-28 – 2014-04-29 (×2): 750 mg via INTRAVENOUS
  Filled 2014-04-28 (×3): qty 150

## 2014-04-28 MED ORDER — METHYLPREDNISOLONE SODIUM SUCC 125 MG IJ SOLR
60.0000 mg | INTRAMUSCULAR | Status: DC
  Administered 2014-04-28 – 2014-04-30 (×3): 60 mg via INTRAVENOUS
  Filled 2014-04-28 (×4): qty 0.96

## 2014-04-28 MED ORDER — PNEUMOCOCCAL VAC POLYVALENT 25 MCG/0.5ML IJ INJ
0.5000 mL | INJECTION | INTRAMUSCULAR | Status: AC
  Administered 2014-04-29: 0.5 mL via INTRAMUSCULAR
  Filled 2014-04-28: qty 0.5

## 2014-04-28 MED ORDER — DM-GUAIFENESIN ER 30-600 MG PO TB12
1.0000 | ORAL_TABLET | Freq: Two times a day (BID) | ORAL | Status: DC
  Administered 2014-04-28: 1 via ORAL
  Filled 2014-04-28 (×7): qty 1

## 2014-04-28 MED ORDER — IPRATROPIUM-ALBUTEROL 0.5-2.5 (3) MG/3ML IN SOLN
3.0000 mL | Freq: Four times a day (QID) | RESPIRATORY_TRACT | Status: DC
  Administered 2014-04-28: 3 mL via RESPIRATORY_TRACT
  Filled 2014-04-28: qty 3

## 2014-04-28 MED ORDER — CETYLPYRIDINIUM CHLORIDE 0.05 % MT LIQD
7.0000 mL | Freq: Two times a day (BID) | OROMUCOSAL | Status: DC
  Administered 2014-04-28 – 2014-05-02 (×9): 7 mL via OROMUCOSAL

## 2014-04-28 NOTE — Progress Notes (Signed)
Utilization review completed.  

## 2014-04-28 NOTE — Progress Notes (Signed)
Pt has had zero urine output since the start of day shift. Bladder scan shows 170 cc urine. Pt requesting pain medication. No PRNs available on MAR. Dr. Sherral Hammers paged with return phone call. No new orders received at this time.  Will continue to monitor. Roselyn Reef Alysandra Lobue,RN

## 2014-04-28 NOTE — Progress Notes (Signed)
INITIAL NUTRITION ASSESSMENT  DOCUMENTATION CODES Per approved criteria  -Severe malnutrition in the context of chronic illness   INTERVENTION:  Diet advancement as able; consider swallow evaluation with SLP to determine safest diet consistency.  If unable to safely advance PO diet and plans for TF, please consult RD for TF management.  NUTRITION DIAGNOSIS: Malnutrition related to inadequate oral intake as evidenced by severe depletion of muscle and subcutaneous fat mass with 14% weight loss in the past 2 months.   Goal: Intake to meet >90% of estimated nutrition needs.  Monitor:  Diet advancement, PO intake, labs, weight trend.  Reason for Assessment: MST  80 y.o. male  Admitting Dx: HCAP (healthcare-associated pneumonia)  ASSESSMENT: 80 y.o. Male presented on 8/27 with complaints of generalized weakness and congestion related to HCAP. History of recurrent aspiration pneumonia.  Worsening chronic venostasis ulcer noted. Remains NPO.   Nutrition Focused Physical Exam:  Subcutaneous Fat:  Orbital Region: severe depletion Upper Arm Region: mild depletion Thoracic and Lumbar Region: NA  Muscle:  Temple Region: severe depletion Clavicle Bone Region: severe depletion Clavicle and Acromion Bone Region: severe depletion Scapular Bone Region: NA Dorsal Hand: WNL Patellar Region: WNL Anterior Thigh Region: mild depletion Posterior Calf Region: mild depletion  Edema: unable to assess  Pt meets criteria for severe MALNUTRITION in the context of chronic illness as evidenced by severe depletion of muscle and subcutaneous fat mass with 14% weight loss in 3 months.   Height: Ht Readings from Last 1 Encounters:  04/28/14 6\' 3"  (1.905 m)    Weight: Wt Readings from Last 1 Encounters:  04/28/14 183 lb 6.8 oz (83.2 kg)    Ideal Body Weight: 89.1 kg  % Ideal Body Weight: 93%  Wt Readings from Last 10 Encounters:  04/28/14 183 lb 6.8 oz (83.2 kg)  04/17/14 190 lb 4 oz  (86.297 kg)  04/13/14 194 lb 8 oz (88.225 kg)  03/16/14 200 lb 9.9 oz (91 kg)  03/16/14 200 lb 9.9 oz (91 kg)  02/27/14 212 lb 2 oz (96.219 kg)  12/03/13 225 lb (102.059 kg)  11/17/13 231 lb 6 oz (104.951 kg)  10/23/13 227 lb 15.3 oz (103.4 kg)  10/12/13 231 lb 8 oz (105.008 kg)    Usual Body Weight: 212 lb 2 months ago  % Usual Body Weight: 86%  BMI:  Body mass index is 22.93 kg/(m^2).  Estimated Nutritional Needs: Kcal: 2100-2300 Protein: 100-120 gm Fluid: 2.3 L  Skin: wounds to right toe, right leg, left foot  Diet Order: NPO  EDUCATION NEEDS: -Education not appropriate at this time   Intake/Output Summary (Last 24 hours) at 04/28/14 1248 Last data filed at 04/28/14 0600  Gross per 24 hour  Intake    450 ml  Output      0 ml  Net    450 ml    Last BM: 8/28   Labs:   Recent Labs Lab 04/12/2014 1708 04/13/2014 1730 04/28/14 0520  NA 130* 131* 133*  K 4.5 4.4 3.9  CL 93* 102 101  CO2 16*  --  18*  BUN 73* 69* 71*  CREATININE 3.04* 3.20* 3.07*  CALCIUM 9.5  --  8.5  GLUCOSE 144* 144* 95    CBG (last 3)   Recent Labs  04/28/14 0039 04/28/14 0549 04/28/14 1153  GLUCAP 164* 104* 100*    Scheduled Meds: . antiseptic oral rinse  7 mL Mouth Rinse q12n4p  . chlorhexidine  15 mL Mouth Rinse BID  . heparin  5,000 Units Subcutaneous 3 times per day  . insulin aspart  0-15 Units Subcutaneous Q6H  . piperacillin-tazobactam (ZOSYN)  IV  3.375 g Intravenous 3 times per day  . [START ON 04/29/2014] pneumococcal 23 valent vaccine  0.5 mL Intramuscular Tomorrow-1000  . vancomycin  750 mg Intravenous Q24H    Continuous Infusions: . sodium chloride 75 mL/hr at 04/28/14 0044    Past Medical History  Diagnosis Date  . Pneumonia   . Hypertension   . Hypercholesteremia   . Diabetes mellitus   . Gout   . Irregular heart beat   . Kidney stone   . Lymphoma     s/p partial gastrectomy  . CHF (congestive heart failure)     Past Surgical History   Procedure Laterality Date  . Partial gastrectomy  1994    for lymphoma  . Hernia repair    . Joint replacement    . Back surgery    . Esophagogastroduodenoscopy N/A 10/28/2012    Procedure: ESOPHAGOGASTRODUODENOSCOPY (EGD);  Surgeon: Lear Ng, MD;  Location: [facility];  Service: Endoscopy;  Laterality: N/A;  . Esophagogastroduodenoscopy Left 03/11/2014    Procedure: ESOPHAGOGASTRODUODENOSCOPY (EGD);  Surgeon: Arta Silence, MD;  Location: [facility];  Service: Endoscopy;  Laterality: Left;    Molli Barrows, RD, LDN, [place] Pager [phone] After Hours Pager [phone]

## 2014-04-28 NOTE — Progress Notes (Signed)
[place] TEAM 1 - Stepdown/ICU TEAM Progress Note  [name] IZT:[id number] DOB: May 08, 1932 DOA: 04/22/2014 PCP: Leola Brazil, MD  Admit HPI / Brief Narrative: [name] is a 80 y.o.PM PMHx  hypertension, diabetes, chronic right-sided heart failure, recurrent aspiration pneumonia, possible achalasia, chronic venous insufficiency, chronic kidney disease.  The patient presented with complaints of generalized weakness and congestion. At the time of my evaluation patient continues she repeatedly say "he is feeling cold"other than that he did not have any other complaint. History was obtained from ED documentation. Multiple attempt to reach family was unsuccessful.  Reportedly family called EMS as the patient was having progressively worsening weakness. He was reported to have condition for a while.  Patient also reported to ER physician that he has been hurting all over. Patient denied any abdominal pain to me no nausea no vomiting.  The patient is coming from home. And at his baseline dependent for most of his ADL.   HPI/Subjective: 8/28 A./O. x1 (does not know where, when, why), will not cooperate with exam will only screen that he hurts all over and then will fall asleep and then repeats the cycle.  Assessment/Plan: Sepsis/Pneumonia aspiration? -Continue current antibiotics -Solu-Medrol 60 mg daily -Flutter valve q 4hr while awake -Mucinex DM BID -NTS BID -N.p.o. until patient passes swallow test -PT/OT consult  Chronic systolic CHF -Maintain hemoglobin> 8.0 -Patient's BP to soft at this moment for any BP medication. Hold all home BP meds  Pulmonary hypertension -See chronic systolic CHF  Atrial fibrillation (new onset)  -Most likely secondary to sepsis, however will check patient's thyroid -Less likely is PE, alcohol, or patient's mild anemia  Dementia -Patient clearly demented and per RN she spoke with son and son concurred that patient has element of  dementia.  Chronic pain syndrome - Unfortunately patient's creatinine clearance is poor, unsure patient is able to swallow safely i.e. Aspirating -Morphine IV 1 mg q 4hr PRN ; closely monitor BP  Diabetes type 2 -2/23 hemoglobin A1c= 6.4 -2/23 lipid panel within ADA guidelines except for mildly elevated TG -Continue moderate SSI  Acute on chronic renal failure -Creatinine clearance= 23 mg/dL -Normal saline at 55ml/hr  Bilateral lower extremity venous stasis ulcers -Continue current wound care regimen -Consult wound nurse -Currently does not look infected however current antibiotic regimen should cover most organisms     Code Status: FULL Family Communication: no family present at time of exam Disposition Plan: Resolution pneumonia    Consultants: NA  Procedure/Significant Events: 7/15 echocardiogram; LVEF= 50% to 55%. - Tricuspid valve moderate regurgitation. - Pulmonary arteries PA peak pressure: 45 mm Hg (S). 8/27 acute abdominal series; Increased opacity left mid/lower lung zones, suspicious for pneumonia.  8/27 CT abdomen pelvis without contrast;Diffuse patchy airspace disease in the lungs with small pleural effusions suggesting edema, aspiration, or pneumonia. -evidence of hepatic cirrhosis, multiple renal cysts, pancreatic atrophy, postoperative abdominal hernia repair,     Culture 8/27 urine pending 8/27 blood x2 pending 8/27 respiratory positive moderate gram-positive cocci in pairs in chains and clusters 8/28 MRSA by PCR negative 8/28 respiratory virus panel pending   Antibiotics: Zosyn 8/27>> Vancomycin 8/27>>   DVT prophylaxis: Heparin subcutaneous  Devices    LINES / TUBES:      Continuous Infusions: . sodium chloride 75 mL/hr at 04/28/14 0044    Objective: VITAL SIGNS: Temp: 99.8 F (37.7 C) (08/28 1225) Temp src: Oral (08/28 1225) BP: 111/48 mmHg (08/28 1300) Pulse Rate: 63 (08/28 1300) SPO2;  98% on 2 L O2 via   FIO2:   Intake/Output Summary (Last 24 hours) at 04/28/14 1428 Last data filed at 04/28/14 0600  Gross per 24 hour  Intake    450 ml  Output      0 ml  Net    450 ml     Exam: General: A./O. x1 (does not know where, when, why), No acute respiratory distress Lungs: Diffuse walls, thick yellow copious amounts sputum, positive diffuse crackles Cardiovascular: Irregular irregular rhythm and rate, without murmur gallop or rub normal S1 and S2 Abdomen: Nontender, nondistended, soft, bowel sounds positive, no rebound, no ascites, no appreciable mass Extremities: No significant cyanosis, clubbing. Bilateral lower extremity venous stasis ulcers covered clean negative signs infection.    Data Reviewed: Basic Metabolic Panel:  Recent Labs Lab 04/19/2014 1708 04/13/2014 1730 04/28/14 0520  NA 130* 131* 133*  K 4.5 4.4 3.9  CL 93* 102 101  CO2 16*  --  18*  GLUCOSE 144* 144* 95  BUN 73* 69* 71*  CREATININE 3.04* 3.20* 3.07*  CALCIUM 9.5  --  8.5   Liver Function Tests:  Recent Labs Lab 04/30/2014 1708 04/28/14 0520  AST 223* 229*  ALT 66* 62*  ALKPHOS 144* 116  BILITOT 0.7 0.5  PROT 7.6 6.3  ALBUMIN 2.6* 2.1*    Recent Labs Lab 04/25/2014 2113  LIPASE 61*   No results found for this basename: AMMONIA,  in the last 168 hours CBC:  Recent Labs Lab 04/21/2014 1708 04/04/2014 1730 04/28/14 0520  WBC 15.4*  --  22.9*  NEUTROABS 14.4*  --  20.6*  HGB 12.9* 15.3 11.0*  HCT 37.9* 45.0 32.3*  MCV 90.0  --  90.2  PLT 234  --  218   Cardiac Enzymes: No results found for this basename: CKTOTAL, CKMB, CKMBINDEX, TROPONINI,  in the last 168 hours BNP (last 3 results)  Recent Labs  11/27/13 1518 04/13/14 2203 04/06/2014 1708  PROBNP 740.1* 7348.0* 21756.0*   CBG:  Recent Labs Lab 04/28/14 0039 04/28/14 0549 04/28/14 1153  GLUCAP 164* 104* 100*    Recent Results (from the past 240 hour(s))  CULTURE, RESPIRATORY (NON-EXPECTORATED)     Status: None   Collection  Time    04/28/14 12:57 AM      Result Value Ref Range Status   Specimen Description TRACHEAL ASPIRATE   Final   Special Requests NONE   Final   Gram Stain     Final   Value: MODERATE WBC PRESENT,BOTH PMN AND MONONUCLEAR     FEW SQUAMOUS EPITHELIAL CELLS PRESENT     MODERATE GRAM POSITIVE COCCI     IN PAIRS IN CHAINS IN CLUSTERS RARE GRAM POSITIVE RODS     RARE GRAM NEGATIVE RODS   Culture PENDING   Incomplete   Report Status PENDING   Incomplete  MRSA PCR SCREENING     Status: None   Collection Time    04/28/14 12:58 AM      Result Value Ref Range Status   MRSA by PCR NEGATIVE  NEGATIVE Final   Comment:            The GeneXpert MRSA Assay (FDA     approved for NASAL specimens     only), is one component of a     comprehensive MRSA colonization     surveillance program. It is not     intended to diagnose MRSA     infection nor to guide or  monitor treatment for     MRSA infections.     Studies:  Recent x-ray studies have been reviewed in detail by the Attending Physician  Scheduled Meds:  Scheduled Meds: . antiseptic oral rinse  7 mL Mouth Rinse q12n4p  . chlorhexidine  15 mL Mouth Rinse BID  . heparin  5,000 Units Subcutaneous 3 times per day  . insulin aspart  0-15 Units Subcutaneous Q6H  . piperacillin-tazobactam (ZOSYN)  IV  3.375 g Intravenous 3 times per day  . [START ON 04/29/2014] pneumococcal 23 valent vaccine  0.5 mL Intramuscular Tomorrow-1000  . vancomycin  750 mg Intravenous Q24H    Time spent on care of this patient: 40 mins   Allie Bossier , MD   [facility] Office  [phone] Pager [phone]  On-Call/Text Page:      [email or web address]      password [email or web address]  If 7PM-7AM, please contact night-coverage [email or web address] Password [email or web address] 04/28/2014, 2:28 PM   LOS: 1 day

## 2014-04-29 DIAGNOSIS — Z7982 Long term (current) use of aspirin: Secondary | ICD-10-CM | POA: Diagnosis not present

## 2014-04-29 DIAGNOSIS — Z903 Acquired absence of stomach [part of]: Secondary | ICD-10-CM | POA: Diagnosis not present

## 2014-04-29 DIAGNOSIS — E871 Hypo-osmolality and hyponatremia: Secondary | ICD-10-CM | POA: Diagnosis present

## 2014-04-29 DIAGNOSIS — E872 Acidosis, unspecified: Secondary | ICD-10-CM | POA: Diagnosis present

## 2014-04-29 DIAGNOSIS — K220 Achalasia of cardia: Secondary | ICD-10-CM | POA: Diagnosis present

## 2014-04-29 DIAGNOSIS — I2789 Other specified pulmonary heart diseases: Secondary | ICD-10-CM | POA: Diagnosis present

## 2014-04-29 DIAGNOSIS — L97909 Non-pressure chronic ulcer of unspecified part of unspecified lower leg with unspecified severity: Secondary | ICD-10-CM | POA: Diagnosis present

## 2014-04-29 DIAGNOSIS — G894 Chronic pain syndrome: Secondary | ICD-10-CM | POA: Diagnosis present

## 2014-04-29 DIAGNOSIS — E41 Nutritional marasmus: Secondary | ICD-10-CM

## 2014-04-29 DIAGNOSIS — M109 Gout, unspecified: Secondary | ICD-10-CM | POA: Diagnosis present

## 2014-04-29 DIAGNOSIS — E119 Type 2 diabetes mellitus without complications: Secondary | ICD-10-CM | POA: Diagnosis present

## 2014-04-29 DIAGNOSIS — F0390 Unspecified dementia without behavioral disturbance: Secondary | ICD-10-CM | POA: Diagnosis present

## 2014-04-29 DIAGNOSIS — IMO0002 Reserved for concepts with insufficient information to code with codable children: Secondary | ICD-10-CM | POA: Diagnosis not present

## 2014-04-29 DIAGNOSIS — R6520 Severe sepsis without septic shock: Secondary | ICD-10-CM | POA: Diagnosis present

## 2014-04-29 DIAGNOSIS — E43 Unspecified severe protein-calorie malnutrition: Secondary | ICD-10-CM | POA: Diagnosis present

## 2014-04-29 DIAGNOSIS — I509 Heart failure, unspecified: Secondary | ICD-10-CM | POA: Diagnosis present

## 2014-04-29 DIAGNOSIS — I120 Hypertensive chronic kidney disease with stage 5 chronic kidney disease or end stage renal disease: Secondary | ICD-10-CM | POA: Diagnosis present

## 2014-04-29 DIAGNOSIS — J15211 Pneumonia due to Methicillin susceptible Staphylococcus aureus: Secondary | ICD-10-CM | POA: Diagnosis present

## 2014-04-29 DIAGNOSIS — I4891 Unspecified atrial fibrillation: Secondary | ICD-10-CM | POA: Diagnosis present

## 2014-04-29 DIAGNOSIS — K7460 Unspecified cirrhosis of liver: Secondary | ICD-10-CM | POA: Diagnosis present

## 2014-04-29 DIAGNOSIS — N185 Chronic kidney disease, stage 5: Secondary | ICD-10-CM | POA: Diagnosis present

## 2014-04-29 DIAGNOSIS — I32 Pericarditis in diseases classified elsewhere: Secondary | ICD-10-CM | POA: Diagnosis present

## 2014-04-29 DIAGNOSIS — I872 Venous insufficiency (chronic) (peripheral): Secondary | ICD-10-CM | POA: Diagnosis present

## 2014-04-29 DIAGNOSIS — I5022 Chronic systolic (congestive) heart failure: Secondary | ICD-10-CM | POA: Diagnosis present

## 2014-04-29 DIAGNOSIS — J9600 Acute respiratory failure, unspecified whether with hypoxia or hypercapnia: Secondary | ICD-10-CM | POA: Diagnosis present

## 2014-04-29 DIAGNOSIS — K7200 Acute and subacute hepatic failure without coma: Secondary | ICD-10-CM | POA: Diagnosis present

## 2014-04-29 DIAGNOSIS — R7401 Elevation of levels of liver transaminase levels: Secondary | ICD-10-CM | POA: Diagnosis present

## 2014-04-29 DIAGNOSIS — A419 Sepsis, unspecified organism: Secondary | ICD-10-CM | POA: Diagnosis present

## 2014-04-29 DIAGNOSIS — J690 Pneumonitis due to inhalation of food and vomit: Secondary | ICD-10-CM | POA: Diagnosis present

## 2014-04-29 DIAGNOSIS — N179 Acute kidney failure, unspecified: Secondary | ICD-10-CM | POA: Diagnosis present

## 2014-04-29 DIAGNOSIS — T68XXXA Hypothermia, initial encounter: Secondary | ICD-10-CM | POA: Diagnosis not present

## 2014-04-29 DIAGNOSIS — R1310 Dysphagia, unspecified: Secondary | ICD-10-CM | POA: Diagnosis present

## 2014-04-29 DIAGNOSIS — Z515 Encounter for palliative care: Secondary | ICD-10-CM | POA: Diagnosis not present

## 2014-04-29 LAB — COMPREHENSIVE METABOLIC PANEL
ALK PHOS: 112 U/L (ref 39–117)
ALT: 76 U/L — AB (ref 0–53)
AST: 308 U/L — ABNORMAL HIGH (ref 0–37)
Albumin: 1.9 g/dL — ABNORMAL LOW (ref 3.5–5.2)
Anion gap: 21 — ABNORMAL HIGH (ref 5–15)
BUN: 80 mg/dL — ABNORMAL HIGH (ref 6–23)
CHLORIDE: 101 meq/L (ref 96–112)
CO2: 12 mEq/L — ABNORMAL LOW (ref 19–32)
Calcium: 8.1 mg/dL — ABNORMAL LOW (ref 8.4–10.5)
Creatinine, Ser: 3.36 mg/dL — ABNORMAL HIGH (ref 0.50–1.35)
GFR calc Af Amer: 18 mL/min — ABNORMAL LOW (ref >90)
GFR, EST NON AFRICAN AMERICAN: 16 mL/min — AB (ref >90)
Glucose, Bld: 140 mg/dL — ABNORMAL HIGH (ref 70–99)
Potassium: 4.5 mEq/L (ref 3.7–5.3)
Sodium: 134 mEq/L — ABNORMAL LOW (ref 137–147)
Total Bilirubin: 0.6 mg/dL (ref 0.3–1.2)
Total Protein: 5.9 g/dL — ABNORMAL LOW (ref 6.0–8.3)

## 2014-04-29 LAB — CBC WITH DIFFERENTIAL/PLATELET
Basophils Absolute: 0 10*3/uL (ref 0.0–0.1)
Basophils Relative: 0 % (ref 0–1)
EOS ABS: 0 10*3/uL (ref 0.0–0.7)
EOS PCT: 0 % (ref 0–5)
HCT: 30.8 % — ABNORMAL LOW (ref 39.0–52.0)
HEMOGLOBIN: 10.7 g/dL — AB (ref 13.0–17.0)
Lymphocytes Relative: 2 % — ABNORMAL LOW (ref 12–46)
Lymphs Abs: 0.3 10*3/uL — ABNORMAL LOW (ref 0.7–4.0)
MCH: 30.3 pg (ref 26.0–34.0)
MCHC: 34.7 g/dL (ref 30.0–36.0)
MCV: 87.3 fL (ref 78.0–100.0)
MONOS PCT: 1 % — AB (ref 3–12)
Monocytes Absolute: 0.1 10*3/uL (ref 0.1–1.0)
Neutro Abs: 15.4 10*3/uL — ABNORMAL HIGH (ref 1.7–7.7)
Neutrophils Relative %: 97 % — ABNORMAL HIGH (ref 43–77)
PLATELETS: 202 10*3/uL (ref 150–400)
RBC: 3.53 MIL/uL — ABNORMAL LOW (ref 4.22–5.81)
RDW: 15.7 % — ABNORMAL HIGH (ref 11.5–15.5)
WBC: 15.8 10*3/uL — ABNORMAL HIGH (ref 4.0–10.5)

## 2014-04-29 LAB — URINE CULTURE: Colony Count: 9000

## 2014-04-29 LAB — GLUCOSE, CAPILLARY
GLUCOSE-CAPILLARY: 147 mg/dL — AB (ref 70–99)
GLUCOSE-CAPILLARY: 129 mg/dL — AB (ref 70–99)
Glucose-Capillary: 119 mg/dL — ABNORMAL HIGH (ref 70–99)
Glucose-Capillary: 135 mg/dL — ABNORMAL HIGH (ref 70–99)

## 2014-04-29 LAB — VANCOMYCIN, TROUGH: VANCOMYCIN TR: 16.9 ug/mL (ref 10.0–20.0)

## 2014-04-29 LAB — MAGNESIUM: Magnesium: 1.9 mg/dL (ref 1.5–2.5)

## 2014-04-29 LAB — LEGIONELLA ANTIGEN, URINE: Legionella Antigen, Urine: NEGATIVE

## 2014-04-29 LAB — LACTIC ACID, PLASMA: Lactic Acid, Venous: 0.9 mmol/L (ref 0.5–2.2)

## 2014-04-29 MED ORDER — ACETYLCYSTEINE 20 % IN SOLN
4.0000 mL | Freq: Two times a day (BID) | RESPIRATORY_TRACT | Status: AC
  Administered 2014-04-29 – 2014-05-01 (×6): 4 mL via RESPIRATORY_TRACT
  Filled 2014-04-29 (×7): qty 4

## 2014-04-29 MED ORDER — ACETYLCYSTEINE 20 % IN SOLN: 4.0000 mL | Freq: Two times a day (BID) | RESPIRATORY_TRACT | Status: DC

## 2014-04-29 MED ORDER — ALBUTEROL SULFATE (2.5 MG/3ML) 0.083% IN NEBU
2.5000 mg | INHALATION_SOLUTION | Freq: Two times a day (BID) | RESPIRATORY_TRACT | Status: DC
  Administered 2014-04-29 – 2014-05-02 (×8): 2.5 mg via RESPIRATORY_TRACT
  Filled 2014-04-29 (×9): qty 3

## 2014-04-29 NOTE — Evaluation (Signed)
Clinical/Bedside Swallow Evaluation Patient Details  Name: [name] MRN: [id number] Date of Birth: March 24, 1932  Today's Date: 04/29/2014 Time: 1600-1620 SLP Time Calculation (min): 20 min  Past Medical History:  Past Medical History  Diagnosis Date  . Pneumonia   . Hypertension   . Hypercholesteremia   . Diabetes mellitus   . Gout   . Irregular heart beat   . Kidney stone   . Lymphoma     s/p partial gastrectomy  . CHF (congestive heart failure)    Past Surgical History:  Past Surgical History  Procedure Laterality Date  . Partial gastrectomy  1994    for lymphoma  . Hernia repair    . Joint replacement    . Back surgery    . Esophagogastroduodenoscopy N/A 10/28/2012    Procedure: ESOPHAGOGASTRODUODENOSCOPY (EGD);  Surgeon: Lear Ng, MD;  Location: [facility];  Service: Endoscopy;  Laterality: N/A;  . Esophagogastroduodenoscopy Left 03/11/2014    Procedure: ESOPHAGOGASTRODUODENOSCOPY (EGD);  Surgeon: Arta Silence, MD;  Location: [facility];  Service: Endoscopy;  Laterality: Left;   HPI:  80 y.o. male with PMHx recurrent pneumonia, possible achalasia,  malnutrition presented with c/o generalized weakness and congestion. Dx of sepsis/pneumonia.  Pt has hx of esophageal stricture and food impaction.  He was hospitalized in July of 2015 with pneumonia, dysphagia with odynophasia and foreign body sensation. Gastroenterology was consulted and he underwent EGD on 7/11. He was found to have esophageal dysmotility, perhaps achalasia, which was felt to increase his risk for aspiration. He did not have esophageal foreign body.  Gastroenterology recommended that he work with speech therapy, however he declined. He has speech therapy and other services available through the [place], and he wanted to return there for ongoing management of his symptoms. He was evaluated at bedside for dysphagia during that admission, and declined instrumental swallow study.     Assessment /  Plan / Recommendation Clinical Impression  Pt with baseline dysphagia, primarily esophageal, and exacerbated today by poor mental status/alertness.  Participated in limited evaluation and presented with poor attention to bolus, delayed effort to swallow, immediate coughing in response to purees/liquids with suctioning ultimately required to remove material from oral cavity.  Congestion is audible/palpable at level of larynx.  Pt is not safe for a PO diet at this time.  Will f/u next date for improvements.    Aspiration Risk  Severe    Diet Recommendation NPO                   Frequency and Duration min 3x week  2 weeks       SLP Swallow Goals     Swallow Study Prior Functional Status       General Date of Onset:  (chronic) HPI: 80 y.o. male with PMHx recurrent pneumonia, possible achalasia,  malnutrition presented with c/o generalized weakness and congestion. Dx of sepsis/pneumonia.  Pt has hx of esophageal stricture and food impaction.  He was hospitalized in July of 2015 with pneumonia, dysphagia with odynophasia and foreign body sensation. Gastroenterology was consulted and he underwent EGD on 7/11. He was found to have esophageal dysmotility, perhaps achalasia, which was felt to increase his risk for aspiration. He did not have esophageal foreign body.  Gastroenterology recommended that he work with speech therapy, however he declined. He has speech therapy and other services available through the [place], and he wanted to return there for ongoing management of his symptoms. He was evaluated at bedside for dysphagia during  that admission, and declined instrumental swallow study.   Type of Study: Bedside swallow evaluation Previous Swallow Assessment: July 2015 Diet Prior to this Study: NPO Temperature Spikes Noted: No Respiratory Status: Nasal cannula (rhonchi bilaterally) History of Recent Intubation: No Behavior/Cognition: Lethargic Oral Cavity - Dentition: Missing  dentition Self-Feeding Abilities: Total assist Patient Positioning: Upright in bed Baseline Vocal Quality: Wet Volitional Cough: Cognitively unable to elicit Volitional Swallow: Able to elicit    Oral/Motor/Sensory Function Overall Oral Motor/Sensory Function: Appears within functional limits for tasks assessed   Ice Chips Ice chips: Impaired Presentation: Spoon Oral Phase Impairments: Reduced labial seal;Poor awareness of bolus Oral Phase Functional Implications: Prolonged oral transit Pharyngeal Phase Impairments: Suspected delayed Swallow;Wet Vocal Quality;Cough - Immediate   Thin Liquid Thin Liquid: Impaired Presentation: Spoon Oral Phase Impairments: Reduced labial seal;Poor awareness of bolus Oral Phase Functional Implications: Prolonged oral transit Pharyngeal  Phase Impairments: Wet Vocal Quality;Cough - Immediate    Nectar Thick Nectar Thick Liquid: Not tested   Honey Thick Honey Thick Liquid: Not tested   Puree Puree: Impaired Presentation: Spoon Oral Phase Impairments: Poor awareness of bolus Oral Phase Functional Implications: Oral holding Pharyngeal Phase Impairments: Wet Vocal Quality;Cough - Immediate   Solid  [name], [name]     Solid: Not tested       [name] 04/29/2014,4:29 PM

## 2014-04-29 NOTE — Progress Notes (Signed)
Patient respiratory assessment done. Patient scored a 9 with indications that he needs to work with his cough an deep breathing as well as a mucolytic was started to help thin secretions. Flutter valve already initiated but patient not doing very well with it. Did well with cough and deep breathing and mucolytic to start in am times the next 72 hours to help thin out secretions.

## 2014-04-29 NOTE — Progress Notes (Signed)
[facility] TEAM 1 - Stepdown/ICU TEAM Progress Note  [name]:[id number] DOB: [birth year] DOA: 04/10/2014 PCP: Leola Brazil, MD  Admit HPI / Brief Narrative: [name] is a 80 y.o.PM PMHx  hypertension, diabetes, chronic right-sided heart failure, recurrent aspiration pneumonia, possible achalasia, chronic venous insufficiency, chronic kidney disease.  The patient presented with complaints of generalized weakness and congestion. At the time of my evaluation patient continues she repeatedly say "he is feeling cold"other than that he did not have any other complaint. History was obtained from ED documentation. Multiple attempt to reach family was unsuccessful.  Reportedly family called EMS as the patient was having progressively worsening weakness. He was reported to have condition for a while.  Patient also reported to ER physician that he has been hurting all over. Patient denied any abdominal pain to me no nausea no vomiting.  The patient is coming from home. And at his baseline dependent for most of his ADL. 8/28 A./O. x1 (does not know where, when, why), will not cooperate with exam will only screen that he hurts all over and then will fall asleep and then repeats the cycle.  HPI/Subjective: 8/29 A./O. x1 (does not know where, when, why), will cooperate with exam. Answers any question with him alright.    Assessment/Plan: Sepsis/Pneumonia aspiration? -Continue current antibiotics -Solu-Medrol 60 mg daily -Flutter valve q 4hr while awake -Mucinex DM BID -NTS BID -Respiratory therapy has started patient on acetylcysteine nebulizers BID to help breakup the mucus. -N.p.o. until patient passes swallow test (unable to pass on 8/29) -PT recommends SNF; OT consult pending  Chronic systolic CHF -Maintain hemoglobin> 8.0 -Patient's BP to soft at this moment for any BP medication. Hold all home BP meds -Daily weights  Pulmonary hypertension -See chronic systolic  CHF  Atrial fibrillation (new onset)  -Most likely secondary to sepsis -TSH within normal limit -Less likely is PE, alcohol, or patient's mild anemia  Dementia -Patient clearly demented and per RN she spoke with son and son concurred that patient has element of dementia.  Chronic pain syndrome - Unfortunately patient's creatinine clearance is poor, unsure patient is able to swallow safely i.e. Aspirating -Morphine IV 1 mg q 4hr PRN ; closely monitor BP  Diabetes type 2 -2/23 hemoglobin A1c= 6.4 -2/23 lipid panel within ADA guidelines except for mildly elevated TG -Continue moderate SSI  Acute on chronic renal failure -Creatinine clearance= 23 mg/dL -Normal saline at 55ml/hr; watch for fluid overload  Bilateral lower extremity venous stasis ulcers -Continue current wound care regimen -Consult wound nurse -Currently does not look infected however current antibiotic regimen should cover most organisms  Severe malnutrition -Will need to contact family if patient cannot consume PO nutrition by Monday and discuss placement of NG tube initially and possible PEG tube further down the road if no improvement     Code Status: FULL Family Communication: no family present at time of exam Disposition Plan: Resolution pneumonia    Consultants: NA  Procedure/Significant Events: 7/15 echocardiogram; LVEF= 50% to 55%. - Tricuspid valve moderate regurgitation. - Pulmonary arteries PA peak pressure: 45 mm Hg (S). 8/27 acute abdominal series; Increased opacity left mid/lower lung zones, suspicious for pneumonia.  8/27 CT abdomen pelvis without contrast;Diffuse patchy airspace disease in the lungs with small pleural effusions suggesting edema, aspiration, or pneumonia. -evidence of hepatic cirrhosis, multiple renal cysts, pancreatic atrophy, postoperative abdominal hernia repair,     Culture 8/27 urine pending 8/27 blood x2 pending 8/27 respiratory positive moderate gram-positive  cocci in pairs in chains and clusters 8/28 MRSA by PCR negative 8/28 respiratory virus panel pending   Antibiotics: Zosyn 8/27>> Vancomycin 8/27>>   DVT prophylaxis: Heparin subcutaneous  Devices    LINES / TUBES:      Continuous Infusions: . sodium chloride 75 mL/hr at 04/29/14 0724    Objective: VITAL SIGNS: Temp: 98.1 F (36.7 C) (08/29 1915) Temp src: Oral (08/29 1915) BP: 111/57 mmHg (08/29 1915) Pulse Rate: 66 (08/29 1915) SPO2; 98% on 2 L O2 via [place] FIO2:   Intake/Output Summary (Last 24 hours) at 04/29/14 1927 Last data filed at 04/29/14 1900  Gross per 24 hour  Intake   2100 ml  Output    210 ml  Net   1890 ml     Exam: General: A./O. x1 (does not know where, when, why), No acute respiratory distress Lungs: Diffuse walls, thick yellow copious amounts sputum, positive diffuse crackles Cardiovascular: Irregular irregular rhythm and rate, without murmur gallop or rub normal S1 and S2 Abdomen: Nontender, nondistended, soft, bowel sounds positive, no rebound, no ascites, no appreciable mass Extremities: No significant cyanosis, clubbing. Bilateral lower extremity venous stasis ulcers covered clean negative signs infection.    Data Reviewed: Basic Metabolic Panel:  Recent Labs Lab 04/16/2014 1708 04/08/2014 1730 04/28/14 0520 04/29/14 0246  NA 130* 131* 133* 134*  K 4.5 4.4 3.9 4.5  CL 93* 102 101 101  CO2 16*  --  18* 12*  GLUCOSE 144* 144* 95 140*  BUN 73* 69* 71* 80*  CREATININE 3.04* 3.20* 3.07* 3.36*  CALCIUM 9.5  --  8.5 8.1*  MG  --   --   --  1.9   Liver Function Tests:  Recent Labs Lab 04/25/2014 1708 04/28/14 0520 04/29/14 0246  AST 223* 229* 308*  ALT 66* 62* 76*  ALKPHOS 144* 116 112  BILITOT 0.7 0.5 0.6  PROT 7.6 6.3 5.9*  ALBUMIN 2.6* 2.1* 1.9*    Recent Labs Lab 04/26/2014 2113  LIPASE 61*   No results found for this basename: AMMONIA,  in the last 168 hours CBC:  Recent Labs Lab 04/04/2014 1708 04/22/2014 1730  04/28/14 0520 04/29/14 0246  WBC 15.4*  --  22.9* 15.8*  NEUTROABS 14.4*  --  20.6* 15.4*  HGB 12.9* 15.3 11.0* 10.7*  HCT 37.9* 45.0 32.3* 30.8*  MCV 90.0  --  90.2 87.3  PLT 234  --  218 202   Cardiac Enzymes: No results found for this basename: CKTOTAL, CKMB, CKMBINDEX, TROPONINI,  in the last 168 hours BNP (last 3 results)  Recent Labs  11/27/13 1518 04/13/14 2203 04/01/2014 1708  PROBNP 740.1* 7348.0* 21756.0*   CBG:  Recent Labs Lab 04/28/14 1819 04/29/14 0035 04/29/14 0524 04/29/14 1239 04/29/14 1757  GLUCAP 98 135* 147* 129* 119*    Recent Results (from the past 240 hour(s))  URINE CULTURE     Status: None   Collection Time    04/25/2014  8:21 PM      Result Value Ref Range Status   Specimen Description URINE, CATHETERIZED   Final   Special Requests NONE   Final   Culture  Setup Time     Final   Value: 04/30/2014 22:56     Performed at [facility]     Final   Value: 9,000 COLONIES/ML     Performed at [facility]   Culture     Final   Value: INSIGNIFICANT GROWTH  Performed at [facility]   Report Status 04/29/2014 FINAL   Final  CULTURE, BLOOD (ROUTINE X 2)     Status: None   Collection Time    04/17/2014  9:07 PM      Result Value Ref Range Status   Specimen Description BLOOD ARM RIGHT   Final   Special Requests BOTTLES DRAWN AEROBIC AND ANAEROBIC 5CC   Final   Culture  Setup Time     Final   Value: 04/28/2014 04:34     Performed at [facility]   Culture     Final   Value:        BLOOD CULTURE RECEIVED NO GROWTH TO DATE CULTURE WILL BE HELD FOR 5 DAYS BEFORE ISSUING A FINAL NEGATIVE REPORT     Performed at [facility]   Report Status PENDING   Incomplete  CULTURE, BLOOD (ROUTINE X 2)     Status: None   Collection Time    04/25/2014  9:13 PM      Result Value Ref Range Status   Specimen Description BLOOD FOREARM RIGHT   Final   Special Requests BOTTLES DRAWN AEROBIC ONLY 5CC   Final    Culture  Setup Time     Final   Value: 04/28/2014 03:27     Performed at [facility]   Culture     Final   Value:        BLOOD CULTURE RECEIVED NO GROWTH TO DATE CULTURE WILL BE HELD FOR 5 DAYS BEFORE ISSUING A FINAL NEGATIVE REPORT     Performed at [facility]   Report Status PENDING   Incomplete  CULTURE, RESPIRATORY (NON-EXPECTORATED)     Status: None   Collection Time    04/28/14 12:57 AM      Result Value Ref Range Status   Specimen Description TRACHEAL ASPIRATE   Final   Special Requests NONE   Final   Gram Stain     Final   Value: MODERATE WBC PRESENT,BOTH PMN AND MONONUCLEAR     FEW SQUAMOUS EPITHELIAL CELLS PRESENT     MODERATE GRAM POSITIVE COCCI     IN PAIRS IN CHAINS IN CLUSTERS RARE GRAM POSITIVE RODS     RARE GRAM NEGATIVE RODS   Culture     Final   Value: Culture reincubated for better growth     Performed at [facility]   Report Status PENDING   Incomplete  MRSA PCR SCREENING     Status: None   Collection Time    04/28/14 12:58 AM      Result Value Ref Range Status   MRSA by PCR NEGATIVE  NEGATIVE Final   Comment:            The GeneXpert MRSA Assay (FDA     approved for NASAL specimens     only), is one component of a     comprehensive MRSA colonization     surveillance program. It is not     intended to diagnose MRSA     infection nor to guide or     monitor treatment for     MRSA infections.     Studies:  Recent x-ray studies have been reviewed in detail by the Attending Physician  Scheduled Meds:  Scheduled Meds: . acetylcysteine  4 mL Nebulization BID  . albuterol  2.5 mg Nebulization BID  . antiseptic oral rinse  7 mL Mouth Rinse q12n4p  . chlorhexidine  15  mL Mouth Rinse BID  . dextromethorphan-guaiFENesin  1 tablet Oral BID  . heparin  5,000 Units Subcutaneous 3 times per day  . insulin aspart  0-15 Units Subcutaneous Q6H  . methylPREDNISolone (SOLU-MEDROL) injection  60 mg Intravenous Q24H  .  piperacillin-tazobactam (ZOSYN)  IV  3.375 g Intravenous 3 times per day  . vancomycin  750 mg Intravenous Q24H    Time spent on care of this patient: 40 mins   Allie Bossier , MD   [facility] Office  [phone] Pager [phone]  On-Call/Text Page:      [email or web address]      password [email or web address]  If 7PM-7AM, please contact night-coverage [email or web address] Password [email or web address] 04/29/2014, 7:27 PM   LOS: 2 days

## 2014-04-29 NOTE — Evaluation (Signed)
Physical Therapy Evaluation Patient Details Name: [name] MRN: [id number] DOB: 11-25-1931 Today's Date: 04/29/2014   History of Present Illness  admitted with generalized weakness and congestion, found to be cue to HCAP  Clinical Impression  Pt admitted with/for general weakness and congestion due to PNA.  Pt currently limited functionally due to the problems listed. ( See problems list.)   Pt will benefit from PT to maximize function and safety in order to get ready for next venue listed below.     Follow Up Recommendations SNF    Equipment Recommendations  Other (comment) (TBA)    Recommendations for Other Services       Precautions / Restrictions Precautions Precautions: Fall      Mobility  Bed Mobility Overal bed mobility: Needs Assistance Bed Mobility: Supine to Sit;Sit to Supine     Supine to sit: Total assist Sit to supine: Total assist   General bed mobility comments: Pt too painful and stiff with little voluntary extremity movement;  Extensive trunk asssit  Transfers Overall transfer level: Needs assistance Equipment used: Rolling walker (2 wheeled) Transfers: Sit to/from Stand Sit to Stand: Total assist;+2 physical assistance         General transfer comment: Sit to stand failed due to unable to get pt's legs into a functional position and no consitent assist to help  Ambulation/Gait                Stairs            Wheelchair Mobility    Modified Rankin (Stroke Patients Only)       Balance Overall balance assessment: Needs assistance Sitting-balance support: Feet supported;No upper extremity supported Sitting balance-Leahy Scale: Fair Sitting balance - Comments: can accept minimal perturbation, but then will start to list against resistance.  Sat EOB between 12-15 min working on balance and truncal activation                                     Pertinent Vitals/Pain Pain Assessment: 0-10 Pain Score:   (pt reports hurts all over but can not rate pain)    Home Living Family/patient expects to be discharged to:: Private residence Living Arrangements: Children Available Help at Discharge: Family;Available PRN/intermittently Type of Home: House Home Access: Ramped entrance     Home Layout: One level Home Equipment: [place] - 4 wheels;Cane - single point;Electric scooter;Shower seat - built in;Grab bars - toilet;Adaptive equipment      Prior Function Level of Independence: Needs assistance   Gait / Transfers Assistance Needed: son does the cooking and shopping           Hand Dominance        Extremity/Trunk Assessment   Upper Extremity Assessment: RUE deficits/detail;LUE deficits/detail RUE Deficits / Details: stiff with work needed to attain full elbow extention. AAROM at shoulder stiff above 90*; strength grossly 3/5     LUE Deficits / Details: stiff with ROM needed to attain full elbow extention; attained >90* should flexion AAROM; grossly >3/5   Lower Extremity Assessment: RLE deficits/detail;LLE deficits/detail;Generalized weakness RLE Deficits / Details: Internally rotated and adducted in position; knee flexion gravity assisted to 80*; difficult assessing strength due to little voluntary movement LLE Deficits / Details: AA/PROM in knee flexion to ~50*; strength difficult to assess due to little voluntary movmeent     Communication   Communication: Expressive difficulties  Cognition   Behavior  During Therapy: Flat affect Overall Cognitive Status: Difficult to assess Area of Impairment: Orientation;Following commands;Safety/judgement;Awareness Orientation Level: Situation     Following Commands: Follows one step commands inconsistently Safety/Judgement: Decreased awareness of safety;Decreased awareness of deficits          General Comments      Exercises        Assessment/Plan    PT Assessment Patient needs continued PT services  PT Diagnosis  Generalized weakness;Acute pain   PT Problem List Decreased strength;Decreased range of motion;Decreased activity tolerance;Decreased balance;Decreased mobility;Decreased knowledge of use of DME;Decreased safety awareness;Pain  PT Treatment Interventions DME instruction;Gait training;Functional mobility training;Therapeutic activities;Balance training;Patient/family education   PT Goals (Current goals can be found in the Care Plan section) Acute Rehab PT Goals Patient Stated Goal: unable to state PT Goal Formulation: With patient Time For Goal Achievement: 05/13/14 Potential to Achieve Goals: Fair    Frequency Min 2X/week   Barriers to discharge Decreased caregiver support      Co-evaluation               End of Session   Activity Tolerance: Patient limited by pain;Patient limited by lethargy Patient left: in bed;with call bell/phone within reach;with nursing/sitter in room Nurse Communication: Mobility status         Time: 9417-4081 PT Time Calculation (min): 25 min   Charges:   PT Evaluation $Initial PT Evaluation Tier I: 1 Procedure PT Treatments $Therapeutic Activity: 8-22 mins   PT G Codes:          Breckyn Troyer, Tessie Fass 04/29/2014, 12:39 PM 04/29/2014  Donnella Sham, PT [phone]  (pager)

## 2014-04-29 NOTE — Consult Note (Signed)
ANTIBIOTIC CONSULT NOTE  Pharmacy Consult for Vancomycin and Zosyn Indication: pneumonia  No Known Allergies  Patient Measurements: Height: 6\' 3"  (190.5 cm) Weight: 186 lb 15.2 oz (84.8 kg) IBW/kg (Calculated) : 84.5  Vital Signs: Temp: 98.1 F (36.7 C) (08/29 1915) Temp src: Oral (08/29 1915) BP: 111/57 mmHg (08/29 1915) Pulse Rate: 66 (08/29 1915) Intake/Output from previous day: 08/28 0701 - 08/29 0700 In: 1225 [I.V.:975; IV Piggyback:250] Out: 210 [Urine:210] Intake/Output from this shift: Total I/O In: 150 [I.V.:150] Out: -   Labs:  Recent Labs  04/09/2014 1708 04/30/2014 1730 04/28/14 0520 04/29/14 0246  WBC 15.4*  --  22.9* 15.8*  HGB 12.9* 15.3 11.0* 10.7*  PLT 234  --  218 202  CREATININE 3.04* 3.20* 3.07* 3.36*   Estimated Creatinine Clearance: 21 ml/min (by C-G formula based on Cr of 3.36).  Medical History: Past Medical History  Diagnosis Date  . Pneumonia   . Hypertension   . Hypercholesteremia   . Diabetes mellitus   . Gout   . Irregular heart beat   . Kidney stone   . Lymphoma     s/p partial gastrectomy  . CHF (congestive heart failure)    Assessment: Presents with acute respiratory failure.  On vancomycin and zosyn for HCAP.  Vancomycin trough is therapeutic despite worsening renal function.  MRSA PCR neg, Blood cx x2 ngtd, resp cx is being re-incubated.  White count still elevated at 15.8, afebrile, lactic acid wnl.  8/29 VT: 16.9  Goal of Therapy:  Vancomycin trough level 15-20 mcg/ml  Plan:  1) Continue Vancomycin 750 mg IV q24h  2) Continue Zosyn 3.375g IV q8 (4 hour infusion) 3) Monitor renal function, cultures and sensitivies, duration of therapy 4) Check another VT if renal function continues to worsen   Hughes Better, PharmD, BCPS Clinical Pharmacist Pager: [phone] 04/29/2014 10:16 PM

## 2014-04-30 LAB — MAGNESIUM: Magnesium: 2 mg/dL (ref 1.5–2.5)

## 2014-04-30 LAB — CBC WITH DIFFERENTIAL/PLATELET
Basophils Absolute: 0 10*3/uL (ref 0.0–0.1)
Basophils Relative: 0 % (ref 0–1)
EOS PCT: 0 % (ref 0–5)
Eosinophils Absolute: 0 10*3/uL (ref 0.0–0.7)
HEMATOCRIT: 31.2 % — AB (ref 39.0–52.0)
Hemoglobin: 10.7 g/dL — ABNORMAL LOW (ref 13.0–17.0)
Lymphocytes Relative: 3 % — ABNORMAL LOW (ref 12–46)
Lymphs Abs: 0.4 10*3/uL — ABNORMAL LOW (ref 0.7–4.0)
MCH: 30.7 pg (ref 26.0–34.0)
MCHC: 34.3 g/dL (ref 30.0–36.0)
MCV: 89.4 fL (ref 78.0–100.0)
Monocytes Absolute: 0.3 10*3/uL (ref 0.1–1.0)
Monocytes Relative: 2 % — ABNORMAL LOW (ref 3–12)
Neutro Abs: 15.1 10*3/uL — ABNORMAL HIGH (ref 1.7–7.7)
Neutrophils Relative %: 95 % — ABNORMAL HIGH (ref 43–77)
PLATELETS: 199 10*3/uL (ref 150–400)
RBC: 3.49 MIL/uL — AB (ref 4.22–5.81)
RDW: 16.4 % — ABNORMAL HIGH (ref 11.5–15.5)
WBC: 15.9 10*3/uL — ABNORMAL HIGH (ref 4.0–10.5)

## 2014-04-30 LAB — BASIC METABOLIC PANEL
Anion gap: 23 — ABNORMAL HIGH (ref 5–15)
BUN: 92 mg/dL — AB (ref 6–23)
CALCIUM: 8 mg/dL — AB (ref 8.4–10.5)
CO2: 12 mEq/L — ABNORMAL LOW (ref 19–32)
Chloride: 101 mEq/L (ref 96–112)
Creatinine, Ser: 4.35 mg/dL — ABNORMAL HIGH (ref 0.50–1.35)
GFR calc Af Amer: 14 mL/min — ABNORMAL LOW (ref >90)
GFR calc non Af Amer: 12 mL/min — ABNORMAL LOW (ref >90)
GLUCOSE: 184 mg/dL — AB (ref 70–99)
Potassium: 4.7 mEq/L (ref 3.7–5.3)
Sodium: 136 mEq/L — ABNORMAL LOW (ref 137–147)

## 2014-04-30 LAB — COMPREHENSIVE METABOLIC PANEL
ALBUMIN: 1.9 g/dL — AB (ref 3.5–5.2)
ALT: 76 U/L — ABNORMAL HIGH (ref 0–53)
AST: 224 U/L — ABNORMAL HIGH (ref 0–37)
Alkaline Phosphatase: 104 U/L (ref 39–117)
Anion gap: 24 — ABNORMAL HIGH (ref 5–15)
BUN: 89 mg/dL — ABNORMAL HIGH (ref 6–23)
CALCIUM: 8.4 mg/dL (ref 8.4–10.5)
CO2: 10 mEq/L — CL (ref 19–32)
CREATININE: 4.02 mg/dL — AB (ref 0.50–1.35)
Chloride: 102 mEq/L (ref 96–112)
GFR calc Af Amer: 15 mL/min — ABNORMAL LOW (ref >90)
GFR, EST NON AFRICAN AMERICAN: 13 mL/min — AB (ref >90)
Glucose, Bld: 180 mg/dL — ABNORMAL HIGH (ref 70–99)
Potassium: 5 mEq/L (ref 3.7–5.3)
Sodium: 136 mEq/L — ABNORMAL LOW (ref 137–147)
Total Bilirubin: 0.6 mg/dL (ref 0.3–1.2)
Total Protein: 6.2 g/dL (ref 6.0–8.3)

## 2014-04-30 LAB — GLUCOSE, CAPILLARY
Glucose-Capillary: 155 mg/dL — ABNORMAL HIGH (ref 70–99)
Glucose-Capillary: 174 mg/dL — ABNORMAL HIGH (ref 70–99)
Glucose-Capillary: 143 mg/dL — ABNORMAL HIGH (ref 70–99)
Glucose-Capillary: 142 mg/dL — ABNORMAL HIGH (ref 70–99)

## 2014-04-30 LAB — LACTIC ACID, PLASMA: Lactic Acid, Venous: 0.9 mmol/L (ref 0.5–2.2)

## 2014-04-30 MED ORDER — VANCOMYCIN HCL IN DEXTROSE 750-5 MG/150ML-% IV SOLN
750.0000 mg | Freq: Once | INTRAVENOUS | Status: AC
  Administered 2014-04-30: 750 mg via INTRAVENOUS
  Filled 2014-04-30: qty 150

## 2014-04-30 MED ORDER — MORPHINE SULFATE 2 MG/ML IJ SOLN
1.0000 mg | INTRAMUSCULAR | Status: DC | PRN
  Administered 2014-04-30: 1 mg via INTRAVENOUS
  Administered 2014-05-01: 2 mg via INTRAVENOUS
  Administered 2014-05-01: 1 mg via INTRAVENOUS
  Administered 2014-05-02 (×4): 2 mg via INTRAVENOUS
  Administered 2014-05-03 (×2): 1 mg via INTRAVENOUS
  Administered 2014-05-03: 2 mg via INTRAVENOUS
  Filled 2014-04-30 (×11): qty 1

## 2014-04-30 MED ORDER — SODIUM CHLORIDE 0.9 % IV SOLN
1.5000 g | Freq: Two times a day (BID) | INTRAVENOUS | Status: DC
  Administered 2014-04-30 – 2014-05-01 (×3): 1.5 g via INTRAVENOUS
  Filled 2014-04-30 (×4): qty 1.5

## 2014-04-30 MED ORDER — VANCOMYCIN HCL IN DEXTROSE 750-5 MG/150ML-% IV SOLN
750.0000 mg | INTRAVENOUS | Status: DC
  Filled 2014-04-30: qty 150

## 2014-04-30 MED ORDER — STERILE WATER FOR INJECTION IV SOLN
INTRAVENOUS | Status: DC
  Administered 2014-04-30 – 2014-05-03 (×6): via INTRAVENOUS
  Filled 2014-04-30 (×10): qty 850

## 2014-04-30 NOTE — Progress Notes (Signed)
Speech Language Pathology Treatment: Dysphagia  Patient Details Name: [name] MRN: [id number] DOB: Jan 02, 1932 Today's Date: 04/30/2014 Time: 3291-9166 SLP Time Calculation (min): 13 min  Assessment / Plan / Recommendation Clinical Impression  F/u for dysphagia: pt less interactive today.  Lethargic, but arousable for limited periods.  Washed pt's face, provided oral care, with minimal pt output elicited.  With total assist to attend to POs, pt unable to achieve labial seal; no oral manipulation; no signs of cognitive recognition of material; no spontaneous swallow elicited.  Ice chips/minimal water suctioned from oral cavity and efforts discontinued.   Will continue f/u.       HPI HPI: 80 y.o. male with PMHx recurrent pneumonia, possible achalasia,  malnutrition presented with c/o generalized weakness and congestion. Dx of sepsis/pneumonia.  Pt has hx of esophageal stricture and food impaction.  He was hospitalized in July of 2015 with pneumonia, dysphagia with odynophasia and foreign body sensation. Gastroenterology was consulted and he underwent EGD on 7/11. He was found to have esophageal dysmotility, perhaps achalasia, which was felt to increase his risk for aspiration. He did not have esophageal foreign body.  Gastroenterology recommended that he work with speech therapy, however he declined. He has speech therapy and other services available through the [place], and he wanted to return there for ongoing management of his symptoms. He was evaluated at bedside for dysphagia during that admission, and declined instrumental swallow study.     Pertinent Vitals Pain Assessment: Faces Faces Pain Scale: No hurt  SLP Plan  Continue with current plan of care    Recommendations Diet recommendations: NPO              Oral Care Recommendations:  (QID) Follow up Recommendations:  (tba) Plan: Continue with current plan of care    GO    Undine Nealis L. Tivis Ringer, [place] CCC/SLP Pager  [phone]  Juan Quam Laurice 04/30/2014, 11:03 AM

## 2014-04-30 NOTE — Progress Notes (Signed)
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  The patient presented with complaints of generalized weakness and congestion. History was obtained from ED documentation. Multiple attempt to reach family was unsuccessful.  Reportedly family called EMS as the patient was having progressively worsening weakness.  Patient also reported to ER physician that he has been hurting all over. Patient denied any abdominal pain to me no nausea no vomiting.  The patient is coming from home. And at his baseline dependent for most of his ADL.  HPI/Subjective:  sleepy but awakens Cannot give meaningful ROS Speech therapy has seen-he failed swallow study on 8/29 Cannot participate with discussion   Assessment/Plan:  Sepsis/Pneumonia aspiration? -transition from Zosyn to Unasyn -Resp cult pending, Respiratory viral panel pending -Solu-Medrol 60 mg daily -Flutter valve q 4hr while awake -Mucinex DM BID -NTS BID -Respiratory therapy has started patient on acetylcysteine nebulizers BID to help breakup the mucus. -N.p.o. -long discussion with family -PT recommends SNF  Chronic systolic CHF -Maintain hemoglobin> 8.0 -Patient's BP to soft at this moment for any BP medication. Hold all home BP meds -weight 86 on admit, currently 87kg + 2.9 L so far  Pulmonary hypertension -See chronic systolic CHF  Initial PAtrial fibrillation now with PVC's -Most likely secondary to sepsis -TSH within normal limit -Less likely is PE, alcohol, or patient's mild anemia -Poor AC candidate which will need to be addressed if frank AFIB noted again on monitor  Dementia -Patient clearly demented and  per RN she spoke with son and son concurred that patient has element of dementia. -sundowns with more activity at night -limit pain meds and encourage sleep wake cycle  Chronic pain syndrome - Unfortunately patient's creatinine clearance is poor, unsure patient is able to swallow safely i.e. Aspirating -Morphine discontinued 8/30  Diabetes type 2 -2/23 hemoglobin A1c= 6.4 -2/23 lipid panel within ADA guidelines except for mildly elevated TG -Continue moderate SSI -sugars 119 to 147  Acute on chronic renal failure -Creatinine clearance= 23 mg/dL -see below  Metabolic acidosis, W[place]=63 -Etiology either Acute renal failure or infectious -obtain lactic acid, hold ABG for right now  -change IVF to Bicarb gtt 75 cc/hr -re-check in am -I have mentioned to family prognosis looks poor if he doesn't turn a corner in 1-2 days on 8/30  Bilateral lower extremity venous stasis ulcers -Continue current wound care regimen -Consult wound nurse -Currently does not look infected  Severe malnutrition -long discussion with gran-daughter Marlana Salvage states she is person who has always made medical decisions] -cannot get through to patient's son [no assigned HCPOA?] -Might need to consider Palliative care and discuss PEG tube risk and benefits     Code Status: FULL Family Communication: no family present at time of exam Disposition Plan: Resolution pneumonia    Consultants: NA  Procedure/Significant Events: 7/15 echocardiogram; LVEF= 50% to 55%. - Tricuspid valve moderate regurgitation. - Pulmonary arteries PA peak pressure: 45 mm Hg (S). 8/27 acute abdominal series; Increased opacity left mid/lower lung zones, suspicious for pneumonia.  8/27 CT abdomen pelvis without contrast;Diffuse patchy airspace disease in the lungs with small pleural effusions suggesting edema, aspiration, or pneumonia. -evidence of hepatic cirrhosis, multiple renal cysts, pancreatic atrophy, postoperative abdominal hernia  repair,     Culture 8/27 urine 9,000 CFU 8/27 blood x2 pending 8/27 respiratory positive moderate gram-positive cocci in pairs in chains and clusters-reculturng 8/28 MRSA by PCR negative 8/28 respiratory virus panel pending   Antibiotics: Zosyn 8/27>>8/30 Vancomycin 8/27>>8/30 Unasyn 8/30   DVT prophylaxis: Heparin subcutaneous  Devices    LINES / TUBES:      Continuous Infusions: . sodium chloride 75 mL/hr at 04/29/14 2336    Objective: VITAL SIGNS: Temp: 97.1 F (36.2 C) (08/30 1129) Temp src: Axillary (08/30 1129) BP: 103/56 mmHg (08/30 1129) Pulse Rate: 72 (08/30 1129) SPO2; 98% on 2 L O2 via [place] FIO2:   Intake/Output Summary (Last 24 hours) at 04/30/14 1145 Last data filed at 04/30/14 1130  Gross per 24 hour  Intake   1625 ml  Output    475 ml  Net   1150 ml     Exam: General: A./O. x1 (does not know where, when, why), No acute respiratory distress Lungs: positive diffuse crackles Cardiovascular: Regular rhythm and rate, without murmur gallop or rub normal S1 and S2 Abdomen: Nontender, nondistended, soft, bowel sounds positive, no rebound, no ascites, no appreciable mass Extremities: No significant cyanosis, clubbing. Bilateral lower extremity venous stasis ulcers [reviewed on RLE today-looked like a burn-irregular] clean negative signs infection.    Data Reviewed: Basic Metabolic Panel:  Recent Labs Lab 04/06/2014 1708 04/25/2014 1730 04/28/14 0520 04/29/14 0246 04/30/14 0245 04/30/14 0821  NA 130* 131* 133* 134* 136* 136*  K 4.5 4.4 3.9 4.5 5.0 4.7  CL 93* 102 101 101 102 101  CO2 16*  --  18* 12* 10* 12*  GLUCOSE 144* 144* 95 140* 180* 184*  BUN 73* 69* 71* 80* 89* 92*  CREATININE 3.04* 3.20* 3.07* 3.36* 4.02* 4.35*  CALCIUM 9.5  --  8.5 8.1* 8.4 8.0*  MG  --   --   --  1.9 2.0  --    Liver Function Tests:  Recent Labs Lab 04/14/2014 1708 04/28/14 0520 04/29/14 0246 04/30/14 0245  AST 223* 229* 308* 224*  ALT 66* 62* 76*  76*  ALKPHOS 144* 116 112 104  BILITOT 0.7 0.5 0.6 0.6  PROT 7.6 6.3 5.9* 6.2  ALBUMIN 2.6* 2.1* 1.9* 1.9*    Recent Labs Lab 04/17/2014 2113  LIPASE 61*   No results found for this basename: AMMONIA,  in the last 168 hours CBC:  Recent Labs Lab 04/05/2014 1708 04/05/2014 1730 04/28/14 0520 04/29/14 0246 04/30/14 0245  WBC 15.4*  --  22.9* 15.8* 15.9*  NEUTROABS 14.4*  --  20.6* 15.4* 15.1*  HGB 12.9* 15.3 11.0* 10.7* 10.7*  HCT 37.9* 45.0 32.3* 30.8* 31.2*  MCV 90.0  --  90.2 87.3 89.4  PLT 234  --  218 202 199   Cardiac Enzymes: No results found for this basename: CKTOTAL, CKMB, CKMBINDEX, TROPONINI,  in the last 168 hours BNP (last 3 results)  Recent Labs  11/27/13 1518 04/13/14 2203 04/24/2014 1708  PROBNP 740.1* 7348.0* 21756.0*   CBG:  Recent Labs Lab 04/29/14 0524 04/29/14 1239 04/29/14 1757 04/29/14 2345 04/30/14 0610  GLUCAP 147* 129* 119* 155* 174*    Recent Results (from the past 240 hour(s))  URINE CULTURE     Status: None   Collection Time    04/03/2014  8:21 PM      Result Value Ref Range Status   Specimen Description URINE, CATHETERIZED   Final   Special Requests NONE   Final   Culture  Setup Time     Final   Value: 04/14/2014 22:56     Performed at [place]     Final   Value: 9,000 COLONIES/ML     Performed at [facility]   Culture     Final   Value: INSIGNIFICANT GROWTH     Performed at [facility]   Report Status 04/29/2014 FINAL   Final  CULTURE, BLOOD (ROUTINE X 2)     Status: None   Collection Time    04/20/2014  9:07 PM      Result Value Ref Range Status   Specimen Description BLOOD ARM RIGHT   Final   Special Requests BOTTLES DRAWN AEROBIC AND ANAEROBIC 5CC   Final   Culture  Setup Time     Final   Value: 04/28/2014 04:34     Performed at [facility]   Culture     Final   Value:        BLOOD CULTURE RECEIVED NO GROWTH TO DATE CULTURE WILL BE HELD FOR 5 DAYS BEFORE ISSUING A  FINAL NEGATIVE REPORT     Performed at [facility]   Report Status PENDING   Incomplete  CULTURE, BLOOD (ROUTINE X 2)     Status: None   Collection Time    04/30/2014  9:13 PM      Result Value Ref Range Status   Specimen Description BLOOD FOREARM RIGHT   Final   Special Requests BOTTLES DRAWN AEROBIC ONLY 5CC   Final   Culture  Setup Time     Final   Value: 04/28/2014 03:27     Performed at [facility]   Culture     Final   Value:        BLOOD CULTURE RECEIVED NO GROWTH TO DATE CULTURE WILL BE HELD FOR 5 DAYS BEFORE ISSUING A FINAL NEGATIVE REPORT     Performed at [facility]   Report Status PENDING   Incomplete  CULTURE, RESPIRATORY (NON-EXPECTORATED)     Status: None   Collection Time    04/28/14 12:57 AM      Result Value Ref Range Status   Specimen Description TRACHEAL ASPIRATE   Final   Special Requests NONE   Final   Gram Stain     Final   Value: MODERATE WBC PRESENT,BOTH PMN AND MONONUCLEAR     FEW SQUAMOUS EPITHELIAL CELLS PRESENT     MODERATE GRAM POSITIVE COCCI     IN PAIRS IN CHAINS IN CLUSTERS RARE GRAM POSITIVE RODS     RARE GRAM NEGATIVE RODS   Culture     Final   Value: Culture reincubated for better growth     Performed at [facility]   Report Status PENDING   Incomplete  MRSA PCR SCREENING     Status: None   Collection Time    04/28/14 12:58 AM      Result Value Ref Range Status   MRSA by PCR NEGATIVE  NEGATIVE Final   Comment:            The GeneXpert MRSA Assay (FDA     approved for NASAL specimens     only), is one component of a     comprehensive MRSA colonization     surveillance program. It is not     intended to diagnose MRSA     infection nor to guide or     monitor treatment for  MRSA infections.     Studies:  Recent x-ray studies have been reviewed in detail by the Attending Physician  Scheduled Meds:  Scheduled Meds: . acetylcysteine  4 mL Nebulization BID  . albuterol  2.5 mg Nebulization BID    . antiseptic oral rinse  7 mL Mouth Rinse q12n4p  . chlorhexidine  15 mL Mouth Rinse BID  . dextromethorphan-guaiFENesin  1 tablet Oral BID  . heparin  5,000 Units Subcutaneous 3 times per day  . insulin aspart  0-15 Units Subcutaneous Q6H  . methylPREDNISolone (SOLU-MEDROL) injection  60 mg Intravenous Q24H  . piperacillin-tazobactam (ZOSYN)  IV  3.375 g Intravenous 3 times per day  . vancomycin  750 mg Intravenous Q24H    Time spent on care of this patient: 3mins  Verneita Griffes, MD [facility] ([phone]   If 7PM-7AM, please contact night-coverage [email or web address] Password [email or web address] 04/30/2014, 11:45 AM   LOS: 3 days

## 2014-04-30 NOTE — Progress Notes (Signed)
80yo male to resume vancomycin for bacteremia w/ GPC in clusters on Gram stain, Cx pending.  Will resume vanc 750mg  IV Q24H, was therapeutic with trough yesterday, last dose 24H ago, will need to monitor closely given unstable renal function.  Wynona Neat, PharmD, BCPS 04/30/2014 10:22 PM

## 2014-05-01 LAB — CBC WITH DIFFERENTIAL/PLATELET
BASOS PCT: 0 % (ref 0–1)
Basophils Absolute: 0 10*3/uL (ref 0.0–0.1)
Eosinophils Absolute: 0 10*3/uL (ref 0.0–0.7)
Eosinophils Relative: 0 % (ref 0–5)
HEMATOCRIT: 31.2 % — AB (ref 39.0–52.0)
HEMOGLOBIN: 11 g/dL — AB (ref 13.0–17.0)
Lymphocytes Relative: 4 % — ABNORMAL LOW (ref 12–46)
Lymphs Abs: 0.5 10*3/uL — ABNORMAL LOW (ref 0.7–4.0)
MCH: 30.9 pg (ref 26.0–34.0)
MCHC: 35.3 g/dL (ref 30.0–36.0)
MCV: 87.6 fL (ref 78.0–100.0)
Monocytes Absolute: 0.4 10*3/uL (ref 0.1–1.0)
Monocytes Relative: 3 % (ref 3–12)
NEUTROS ABS: 11.2 10*3/uL — AB (ref 1.7–7.7)
Neutrophils Relative %: 93 % — ABNORMAL HIGH (ref 43–77)
Platelets: 177 10*3/uL (ref 150–400)
RBC: 3.56 MIL/uL — ABNORMAL LOW (ref 4.22–5.81)
RDW: 16 % — ABNORMAL HIGH (ref 11.5–15.5)
WBC: 12.1 10*3/uL — ABNORMAL HIGH (ref 4.0–10.5)

## 2014-05-01 LAB — RESPIRATORY VIRUS PANEL
Adenovirus: NOT DETECTED
INFLUENZA A H3: NOT DETECTED
Influenza A H1: NOT DETECTED
Influenza A: NOT DETECTED
Influenza B: NOT DETECTED
Metapneumovirus: NOT DETECTED
PARAINFLUENZA 1 A: NOT DETECTED
Parainfluenza 2: NOT DETECTED
Parainfluenza 3: NOT DETECTED
RESPIRATORY SYNCYTIAL VIRUS A: NOT DETECTED
RHINOVIRUS: NOT DETECTED
Respiratory Syncytial Virus B: NOT DETECTED

## 2014-05-01 LAB — COMPREHENSIVE METABOLIC PANEL
ALBUMIN: 2 g/dL — AB (ref 3.5–5.2)
ALK PHOS: 85 U/L (ref 39–117)
ALT: 66 U/L — AB (ref 0–53)
ANION GAP: 21 — AB (ref 5–15)
AST: 123 U/L — ABNORMAL HIGH (ref 0–37)
BUN: 98 mg/dL — ABNORMAL HIGH (ref 6–23)
CO2: 16 mEq/L — ABNORMAL LOW (ref 19–32)
Calcium: 7.8 mg/dL — ABNORMAL LOW (ref 8.4–10.5)
Chloride: 101 mEq/L (ref 96–112)
Creatinine, Ser: 5.06 mg/dL — ABNORMAL HIGH (ref 0.50–1.35)
GFR calc Af Amer: 11 mL/min — ABNORMAL LOW (ref >90)
GFR calc non Af Amer: 10 mL/min — ABNORMAL LOW (ref >90)
Glucose, Bld: 182 mg/dL — ABNORMAL HIGH (ref 70–99)
POTASSIUM: 4.7 meq/L (ref 3.7–5.3)
SODIUM: 138 meq/L (ref 137–147)
TOTAL PROTEIN: 6 g/dL (ref 6.0–8.3)
Total Bilirubin: 0.6 mg/dL (ref 0.3–1.2)

## 2014-05-01 LAB — MAGNESIUM: Magnesium: 2.1 mg/dL (ref 1.5–2.5)

## 2014-05-01 LAB — GLUCOSE, CAPILLARY
GLUCOSE-CAPILLARY: 126 mg/dL — AB (ref 70–99)
Glucose-Capillary: 156 mg/dL — ABNORMAL HIGH (ref 70–99)
Glucose-Capillary: 171 mg/dL — ABNORMAL HIGH (ref 70–99)
Glucose-Capillary: 172 mg/dL — ABNORMAL HIGH (ref 70–99)

## 2014-05-01 LAB — CULTURE, RESPIRATORY

## 2014-05-01 LAB — CULTURE, RESPIRATORY W GRAM STAIN

## 2014-05-01 MED ORDER — LEVOFLOXACIN IN D5W 750 MG/150ML IV SOLN
750.0000 mg | Freq: Once | INTRAVENOUS | Status: AC
  Administered 2014-05-01: 750 mg via INTRAVENOUS
  Filled 2014-05-01: qty 150

## 2014-05-01 MED ORDER — SODIUM CHLORIDE 0.9 % IV BOLUS (SEPSIS)
500.0000 mL | Freq: Once | INTRAVENOUS | Status: AC
  Administered 2014-05-01: 500 mL via INTRAVENOUS

## 2014-05-01 MED ORDER — LEVOFLOXACIN IN D5W 500 MG/100ML IV SOLN
500.0000 mg | INTRAVENOUS | Status: DC
  Filled 2014-05-01: qty 100

## 2014-05-01 NOTE — Clinical Documentation Improvement (Signed)
Presents with Sepsis, Pneumonia, history of aspiration Pneumonia.  Please clarify the type of Pneumonia if known. Respiratory Cx's positive for Staph Aureus   Aspiration Pneumonia  Bacterial Pneumonia Staph/MRSA PNA Other Condition   Thank You, Zoila Shutter ,RN Clinical Documentation Specialist:  [phone]  [place] Information Management

## 2014-05-01 NOTE — Clinical Documentation Improvement (Signed)
Presents with Sepsis, Pneumonia, ARF and CKD.   Black male  Creatinines ranging from 3.04 to 5.06  GFR's ranging from 21 to 11  Please clarify the stage of CKD from the list below and document findings in next progress note and carry to discharge summary.  _______CKD Stage I - GFR > OR = 90 _______CKD Stage II - GFR 60-80 _______CKD Stage III - GFR 30-59 _______CKD Stage IV - GFR 15-29 _______CKD Stage V - GFR < 15 _______ESRD (End Stage Renal Disease) _______Other condition_____________   Angela Adam ,RN Clinical Documentation Specialist:  [phone]  [place] Information Management

## 2014-05-01 NOTE — Progress Notes (Signed)
ANTIBIOTIC CONSULT NOTE - INITIAL  Pharmacy Consult for Levaquin Indication: pneumonia  No Known Allergies  Patient Measurements: Height: 6\' 3"  (190.5 cm) Weight: 195 lb 1.7 oz (88.5 kg) IBW/kg (Calculated) : 84.5  Vital Signs: Temp: 97.6 F (36.4 C) (08/31 1440) Temp src: Axillary (08/31 1440) BP: 119/63 mmHg (08/31 1440) Pulse Rate: 65 (08/31 1440) Intake/Output from previous day: 08/30 0701 - 08/31 0700 In: 2050 [I.V.:1800; IV Piggyback:50] Out: 390 [Urine:390] Intake/Output from this shift: Total I/O In: 150 [I.V.:150] Out: 7 [Urine:7]  Labs:  Recent Labs  04/29/14 0246 04/30/14 0245 04/30/14 0821 05/01/14 0345  WBC 15.8* 15.9*  --  12.1*  HGB 10.7* 10.7*  --  11.0*  PLT 202 199  --  177  CREATININE 3.36* 4.02* 4.35* 5.06*   Estimated Creatinine Clearance: 13.9 ml/min (by C-G formula based on Cr of 5.06).  Recent Labs  04/29/14 2120  [place] 16.9     Microbiology: Recent Results (from the past 720 hour(s))  URINE CULTURE     Status: None   Collection Time    04/26/2014  8:21 PM      Result Value Ref Range Status   Specimen Description URINE, CATHETERIZED   Final   Special Requests NONE   Final   Culture  Setup Time     Final   Value: 04/09/2014 22:56     Performed at [place]     Final   Value: 9,000 COLONIES/ML     Performed at [facility]   Culture     Final   Value: INSIGNIFICANT GROWTH     Performed at [facility]   Report Status 04/29/2014 FINAL   Final  CULTURE, BLOOD (ROUTINE X 2)     Status: None   Collection Time    04/10/2014  9:07 PM      Result Value Ref Range Status   Specimen Description BLOOD ARM RIGHT   Final   Special Requests BOTTLES DRAWN AEROBIC AND ANAEROBIC 5CC   Final   Culture  Setup Time     Final   Value: 04/28/2014 04:34     Performed at [facility]   Culture     Final   Value:        BLOOD CULTURE RECEIVED NO GROWTH TO DATE CULTURE WILL BE HELD FOR 5 DAYS  BEFORE ISSUING A FINAL NEGATIVE REPORT     Performed at [facility]   Report Status PENDING   Incomplete  CULTURE, BLOOD (ROUTINE X 2)     Status: None   Collection Time    04/30/2014  9:13 PM      Result Value Ref Range Status   Specimen Description BLOOD FOREARM RIGHT   Final   Special Requests BOTTLES DRAWN AEROBIC ONLY 5CC   Final   Culture  Setup Time     Final   Value: 04/28/2014 03:27     Performed at [facility]   Culture     Final   Value: GRAM POSITIVE COCCI IN CLUSTERS     Note: Gram Stain Report Called to,Read Back By and Verified With: LEE WORLEY 04/30/14 @ 9:40PM BY RUSCOE A.     Performed at [facility]   Report Status PENDING   Incomplete  CULTURE, RESPIRATORY (NON-EXPECTORATED)     Status: None   Collection Time    04/28/14 12:57 AM      Result Value Ref Range Status  Specimen Description TRACHEAL ASPIRATE   Final   Special Requests NONE   Final   Gram Stain     Final   Value: MODERATE WBC PRESENT,BOTH PMN AND MONONUCLEAR     FEW SQUAMOUS EPITHELIAL CELLS PRESENT     MODERATE GRAM POSITIVE COCCI     IN PAIRS IN CHAINS IN CLUSTERS RARE GRAM POSITIVE RODS     RARE GRAM NEGATIVE RODS   Culture     Final   Value: MODERATE STAPHYLOCOCCUS AUREUS     Note: RIFAMPIN AND GENTAMICIN SHOULD NOT BE USED AS SINGLE DRUGS FOR TREATMENT OF STAPH INFECTIONS. This organism is presumed to be Clindamycin resistant based on detection of inducible Clindamycin resistance.     Performed at [facility]   Report Status 05/01/2014 FINAL   Final   Organism ID, Bacteria STAPHYLOCOCCUS AUREUS   Final  MRSA PCR SCREENING     Status: None   Collection Time    04/28/14 12:58 AM      Result Value Ref Range Status   MRSA by PCR NEGATIVE  NEGATIVE Final   Comment:            The GeneXpert MRSA Assay (FDA     approved for NASAL specimens     only), is one component of a     comprehensive MRSA colonization     surveillance program. It is not     intended to  diagnose MRSA     infection nor to guide or     monitor treatment for     MRSA infections.    Medical History: Past Medical History  Diagnosis Date  . Pneumonia   . Hypertension   . Hypercholesteremia   . Diabetes mellitus   . Gout   . Irregular heart beat   . Kidney stone   . Lymphoma     s/p partial gastrectomy  . CHF (congestive heart failure)     Medications:  Prescriptions prior to admission  Medication Sig Dispense Refill  . albuterol (PROVENTIL HFA;VENTOLIN HFA) 108 (90 BASE) MCG/ACT inhaler Inhale 2 puffs into the lungs every 6 (six) hours as needed for wheezing or shortness of breath.      . allopurinol (ZYLOPRIM) 100 MG tablet Take 100 mg by mouth daily.      Marland Kitchen aspirin EC 81 MG tablet Take 1 tablet (81 mg total) by mouth daily.  30 tablet  3  . carvedilol (COREG) 12.5 MG tablet Take 12.5 mg by mouth 2 (two) times daily with a meal.      . gabapentin (NEURONTIN) 100 MG capsule Take 100 mg by mouth 2 (two) times daily.      . hydrALAZINE (APRESOLINE) 50 MG tablet Take 50 mg by mouth 3 (three) times daily.      Marland Kitchen HYDROcodone-acetaminophen (NORCO/VICODIN) 5-325 MG per tablet Take 1 tablet by mouth every 4 (four) hours as needed for moderate pain.      Marland Kitchen loperamide (IMODIUM) 2 MG capsule Take 2 mg by mouth daily. Take with food      . mirtazapine (REMERON) 15 MG tablet Take 7.5 mg by mouth at bedtime.      . niacin (SLO-NIACIN) 500 MG tablet Take 1,000 mg by mouth at bedtime. Take after a low fat snack      . omeprazole (PRILOSEC) 20 MG capsule Take 20 mg by mouth daily.      . sertraline (ZOLOFT) 100 MG tablet Take 200 mg by mouth at bedtime.       Marland Kitchen  sucralfate (CARAFATE) 1 G tablet Take 1 g by mouth daily.      . vitamin B-12 (CYANOCOBALAMIN) 100 MCG tablet Take 100 mcg by mouth daily.       Assessment: 80 yo male to start Levaquin for bibasilar aspiration pneumonia. Pt is afebrile with improving leukocytosis on day 5 of antibiotics total. sCr continues to worsen from  admit, now 5.06 for CrCl < 20 mL/min. Will renally adjust Levaquin accordingly.  Goal of Therapy:  Clinical improvement  Plan:  - Start levaquin 750mg  today x 1 - Reduce dose to levaquin 500mg  q48 hour to start 05/07/14 - F/u renal function, cultures, and clinical outlook  [name]. Diona Foley, PharmD Clinical Pharmacist Pager [phone] 05/01/2014,3:49 PM

## 2014-05-01 NOTE — Progress Notes (Signed)
[place] TEAM 1 - Stepdown/ICU TEAM Progress Note  KHANI PAINO NFA:[id number] DOB: 1932/07/27 DOA: 04/03/2014 PCP: Leola Brazil, MD  Admit HPI / Brief Narrative: 80 yo w/ Hx hypertension, diabetes, chronic right-sided heart failure, recurrent aspiration pneumonia, possible achalasia, chronic venous insufficiency, and chronic kidney disease who presented with complaints of generalized weakness and congestion.  Reportedly family called EMS as the patient was having progressively worsening weakness.  HPI/Subjective: Mental status remains quite impaired.  Pt is not able to answer any questions or provide any hx.  Son is at bedside.   Assessment/Plan:  Sepsis due to bibasilar Aspiration Pneumonia v/s possible endocarditis  Cont tx for staph aureus - stop Vanc for now in setting of severe AKI, and use Levaquin (proven sensitive on sputum cx) - follow blood cx sensitivities to determine if need to resume Vanc   1/2 positive blood cx w/ gram + cocci in clusters  Worrisome for possible Staph aureus bacteremia, in which case endocarditis would have to be considered - await speciation - see discussion above concerning abx use   Staph aureus in tracheal aspirate - oxacillin sensitive see discussion above concerning abx use   Severe progressive acute on chronic renal failure -creatinine clearance now 10 mg/dL = Stage V CKD - this represents acute rapid decline, w/ baseline crt in July/early Aug ~2.0 - no evidence of hydronephrosis via CT abdom 8/27 - urine outpt greatly diminished - hydrate - flush foley to assure no obstruction - discussed severity of this issue w/ family at length, including fact that he may not be a candidate for HD, which would mean he would die from this if his renal fxn does not soon begin to improve - family would desire attempt at HD/Nephrology consult if crt is not improved by 9/1- stop Vanc - avoid contrast/other nephrotoxins - follow in serial fashion    Chronic systolic CHF -Maintain hemoglobin> 8.0 -Patient's BP too soft at this moment for any BP medication. Hold all home BP meds -Daily weights  Transaminitis - hepatic cirrhosis  Cirrhosis suggested on CT abdom - family states pt was "treated for liver cancer a long time ago" - further hx not available at present - follow trend - LFTs are improving   Pulmonary hypertension -See chronic systolic CHF  Parox Atrial fibrillation / flutter  -noted during July 2015 admit  -TSH within normal limit -HR controlled - not safe for anticoag at present  Dementia -Patient clearly demented and per RN she spoke with son and son concurred that patient has element of dementia - additionally, he is uremic at this time and not competent to make decisions as he is essentially obtunded   Chronic pain syndrome -Morphine prn   Diabetes type 2 -2/23 hemoglobin A1c= 6.4 -2/23 lipid panel within ADA guidelines except for mildly elevated TG -Continue moderate SSI  Bilateral lower extremity venous stasis ulcers -Continue current wound care regimen -Consult wound nurse -Currently does not look infected however current antibiotic regimen should cover most organisms  Severe malnutrition -discussed options for nutrition w/ son/family at bedside - they currently want "everything to be done" - will placed PANDA and begin TF once in place - discussed w/ family that PEG would be next step if he does not improve, but would not be appropriate if he is deemed to not be a HD candidate   Code Status: FULL Family Communication: spoke w/ son and other family at bedside  Disposition Plan: SDU   Consultants: none  Procedure/Significant Events: 7/15 echocardiogram; LVEF= 50% to 55%. - Tricuspid valve moderate regurgitation. - Pulmonary arteries PA peak pressure: 45 mm Hg (S). 8/27 acute abdominal series; Increased opacity left mid/lower lung zones, suspicious for pneumonia.  8/27 CT abdomen pelvis without  contrast;Diffuse patchy airspace disease in the lungs with small pleural effusions suggesting edema, aspiration, or pneumonia. -evidence of hepatic cirrhosis, multiple renal cysts, pancreatic atrophy, postoperative abdominal hernia repair,   Antibiotics: Zosyn 8/27>> 8/29 Vancomycin 8/27>> 8/30 Unasyn 8/30 >> 8/31 Levaquin 8/31 >>  DVT prophylaxis: Heparin subcutaneous  Objective: Blood pressure 119/63, pulse 65, temperature 97.6 F (36.4 C), temperature source Axillary, resp. rate 11, height 6\' 3"  (1.905 m), weight 88.5 kg (195 lb 1.7 oz), SpO2 98.00%.  Intake/Output Summary (Last 24 hours) at 05/01/14 1843 Last data filed at 05/01/14 1801  Gross per 24 hour  Intake 2532.5 ml  Output    397 ml  Net 2135.5 ml   Exam: General: obtunded - no evidence of acute resp distress at present  Lungs: course bibasilar crackles - no wheeze  Cardiovascular: Irregular irregular rhythm, controlled rate, without murmur gallop or rub  Abdomen: Nontender, nondistended, soft, bowel sounds positive, no rebound, no ascites, no appreciable mass GU:  Foley in place w/ scant yellow urine in bag  Extremities: No significant cyanosis, clubbing - bilateral lower extremity venous stasis ulcers covered and clean  Data Reviewed: Basic Metabolic Panel:  Recent Labs Lab 04/28/14 0520 04/29/14 0246 04/30/14 0245 04/30/14 0821 05/01/14 0345  NA 133* 134* 136* 136* 138  K 3.9 4.5 5.0 4.7 4.7  CL 101 101 102 101 101  CO2 18* 12* 10* 12* 16*  GLUCOSE 95 140* 180* 184* 182*  BUN 71* 80* 89* 92* 98*  CREATININE 3.07* 3.36* 4.02* 4.35* 5.06*  CALCIUM 8.5 8.1* 8.4 8.0* 7.8*  MG  --  1.9 2.0  --  2.1   Liver Function Tests:  Recent Labs Lab 04/30/2014 1708 04/28/14 0520 04/29/14 0246 04/30/14 0245 05/01/14 0345  AST 223* 229* 308* 224* 123*  ALT 66* 62* 76* 76* 66*  ALKPHOS 144* 116 112 104 85  BILITOT 0.7 0.5 0.6 0.6 0.6  PROT 7.6 6.3 5.9* 6.2 6.0  ALBUMIN 2.6* 2.1* 1.9* 1.9* 2.0*    Recent  Labs Lab 04/20/2014 2113  LIPASE 61*   CBC:  Recent Labs Lab 04/07/2014 1708 04/07/2014 1730 04/28/14 0520 04/29/14 0246 04/30/14 0245 05/01/14 0345  WBC 15.4*  --  22.9* 15.8* 15.9* 12.1*  NEUTROABS 14.4*  --  20.6* 15.4* 15.1* 11.2*  HGB 12.9* 15.3 11.0* 10.7* 10.7* 11.0*  HCT 37.9* 45.0 32.3* 30.8* 31.2* 31.2*  MCV 90.0  --  90.2 87.3 89.4 87.6  PLT 234  --  218 202 199 177   CBG:  Recent Labs Lab 04/30/14 1132 04/30/14 1731 04/30/14 2334 05/01/14 0557 05/01/14 1136  GLUCAP 143* 142* 156* 171* 172*    Recent Results (from the past 240 hour(s))  URINE CULTURE     Status: None   Collection Time    04/07/2014  8:21 PM      Result Value Ref Range Status   Specimen Description URINE, CATHETERIZED   Final   Special Requests NONE   Final   Culture  Setup Time     Final   Value: 04/26/2014 22:56     Performed at [facility]     Final   Value: 9,000 COLONIES/ML     Performed at [facility]  Culture     Final   Value: INSIGNIFICANT GROWTH     Performed at [facility]   Report Status 04/29/2014 FINAL   Final  CULTURE, BLOOD (ROUTINE X 2)     Status: None   Collection Time    05/01/2014  9:07 PM      Result Value Ref Range Status   Specimen Description BLOOD ARM RIGHT   Final   Special Requests BOTTLES DRAWN AEROBIC AND ANAEROBIC 5CC   Final   Culture  Setup Time     Final   Value: 04/28/2014 04:34     Performed at [facility]   Culture     Final   Value:        BLOOD CULTURE RECEIVED NO GROWTH TO DATE CULTURE WILL BE HELD FOR 5 DAYS BEFORE ISSUING A FINAL NEGATIVE REPORT     Performed at [facility]   Report Status PENDING   Incomplete  CULTURE, BLOOD (ROUTINE X 2)     Status: None   Collection Time    04/30/2014  9:13 PM      Result Value Ref Range Status   Specimen Description BLOOD FOREARM RIGHT   Final   Special Requests BOTTLES DRAWN AEROBIC ONLY 5CC   Final   Culture  Setup Time     Final   Value:  04/28/2014 03:27     Performed at [facility]   Culture     Final   Value: GRAM POSITIVE COCCI IN CLUSTERS     Note: Gram Stain Report Called to,Read Back By and Verified With: LEE WORLEY 04/30/14 @ 9:40PM BY RUSCOE A.     Performed at [facility]   Report Status PENDING   Incomplete  CULTURE, RESPIRATORY (NON-EXPECTORATED)     Status: None   Collection Time    04/28/14 12:57 AM      Result Value Ref Range Status   Specimen Description TRACHEAL ASPIRATE   Final   Special Requests NONE   Final   Gram Stain     Final   Value: MODERATE WBC PRESENT,BOTH PMN AND MONONUCLEAR     FEW SQUAMOUS EPITHELIAL CELLS PRESENT     MODERATE GRAM POSITIVE COCCI     IN PAIRS IN CHAINS IN CLUSTERS RARE GRAM POSITIVE RODS     RARE GRAM NEGATIVE RODS   Culture     Final   Value: MODERATE STAPHYLOCOCCUS AUREUS     Note: RIFAMPIN AND GENTAMICIN SHOULD NOT BE USED AS SINGLE DRUGS FOR TREATMENT OF STAPH INFECTIONS. This organism is presumed to be Clindamycin resistant based on detection of inducible Clindamycin resistance.     Performed at [facility]   Report Status 05/01/2014 FINAL   Final   Organism ID, Bacteria STAPHYLOCOCCUS AUREUS   Final  MRSA PCR SCREENING     Status: None   Collection Time    04/28/14 12:58 AM      Result Value Ref Range Status   MRSA by PCR NEGATIVE  NEGATIVE Final   Comment:            The GeneXpert MRSA Assay (FDA     approved for NASAL specimens     only), is one component of a     comprehensive MRSA colonization     surveillance program. It is not     intended to diagnose MRSA     infection nor to guide or     monitor treatment for  MRSA infections.     Studies:  Recent x-ray studies have been reviewed in detail by the Attending Physician  Scheduled Meds:  Scheduled Meds: . acetylcysteine  4 mL Nebulization BID  . albuterol  2.5 mg Nebulization BID  . antiseptic oral rinse  7 mL Mouth Rinse q12n4p  . chlorhexidine  15 mL Mouth Rinse  BID  . heparin  5,000 Units Subcutaneous 3 times per day  . insulin aspart  0-15 Units Subcutaneous Q6H  . [START ON 2014/05/26] levofloxacin (LEVAQUIN) IV  500 mg Intravenous Q48H  . levofloxacin (LEVAQUIN) IV  750 mg Intravenous Once  . sodium chloride  500 mL Intravenous Once    Time spent on care of this patient: 35 mins  Cherene Altes, MD [facility] For Consults/Admissions - Flow Manager - [phone] Office  [phone] Pager [phone]  On-Call/Text Page:      [email or web address]      password [facility]  05/01/2014, 6:43 PM   LOS: 4 days

## 2014-05-01 NOTE — Progress Notes (Signed)
Bladder scanned pt per order. Scan shows 17mL. Will give bolus as ordered and scan bladder again.

## 2014-05-01 NOTE — Consult Note (Signed)
WOC wound consult note Reason for Consult: LE ulcers, evaluate. Reviewed record, pt is not able to provide me with any history and their is no family in the room at the time of my assessment.  He apparently is followed by [facility] and the [place].   Wound type: Concerned for arterial ulceration in the patient.  I am able to doppler his pulses, however with the lack of edema and the presentation of the necrotic ulcer on the right great toe it may be of some benefit to obtain a ABI.  He does not have any open wounds or otherwise on his LLE.  Pressure Ulcer POA:/No Measurement: right great toe: lateral 1.5cm x 1.0cm x 0.2cm; posterior RLE calf: large partial thickness skin loss extends over 20 cm x 5cm x 0.2cm  Wound bed: The area on the posterior right calf is clean, moist, and does not appear to be infected.  The right great toe is dry and covered with 100% black/brown eschar Drainage (amount, consistency, odor) minimal noted at the time of my assessment.  Periwound: intact Dressing procedure/placement/frequency: Xeroform has been initiated  by the bedside staff for the partial thickness areas on the RLE and the drainage seems to be under better control and less odor. Will continue the xeroform for moist wound healing and the antibacterial affects.  Keep the right great toe dry and stable with betadine. No need for topical wound care for the LLE. Will not reorder the Unna's boots at this time until arterial flow can be established.  [facility] and with [facility] at the time of dc if dc back to home.   Discussed POC  bedside nurse.  Re consult if needed, will not follow at this time. Thanks  [name] [place], [place] [phone])

## 2014-05-01 NOTE — Progress Notes (Signed)
Bladder scanned pt after 500cc bolus. Scan now shows 67ml in bladder. Paged [facility] to clarify order for foley irrigation. Waiting for call back.

## 2014-05-01 NOTE — Progress Notes (Signed)
OT Cancellation Note  Patient Details Name: [name] MRN: [id number] DOB: 28-Sep-1931   Cancelled Treatment:    Reason Eval/Treat Not Completed: OT screened.  Pt  current D/C plan is SNF. No apparent immediate acute care OT needs, therefore will defer OT to SNF. If OT eval is needed please call Acute Rehab Dept. at [phone] or text page OT at [phone].    Benito Mccreedy OTR/L [phone] 05/01/2014, 8:20 AM

## 2014-05-01 NOTE — Progress Notes (Signed)
Spoke with Dr Thereasa Solo about pt's bladder scans and urine output as well as order to irrigate foley. He clarified that foley irrigation order can be D/Cd.

## 2014-05-01 NOTE — Progress Notes (Signed)
Speech Language Pathology Treatment: Dysphagia  Patient Details Name: [name] MRN: [id number] DOB: 1932/07/08 Today's Date: 05/01/2014 Time: 6195-0932 SLP Time Calculation (min): 18 min  Assessment / Plan / Recommendation Clinical Impression  Performance during diagnostic treatment today consistent with previous date. Patient arousable for short periods of time but with limited interaction.  SLP provided oral care, with minimal pt output elicited. With total assist to attend to POs, pt unable to achieve labial seal; no oral manipulation; no signs of cognitive recognition of material; no spontaneous swallow elicited. Ice chips/minimal water suctioned from oral cavity and efforts discontinued. Education complete with grandaughter and family friend regarding current limitations prohibiting ability to safety consume pos at this time. Both are eager for patient to eat and drink. Prognosis for ability to consume pos good with improved mentation however given h/o dysphagia with previous recommendations for instrumental testing, MBS may be beneficial when ready if patient agreeable. Will continue f/u    HPI HPI: 80 y.o. male with PMHx recurrent pneumonia, possible achalasia,  malnutrition presented with c/o generalized weakness and congestion. Dx of sepsis/pneumonia.  Pt has hx of esophageal stricture and food impaction.  He was hospitalized in July of 2015 with pneumonia, dysphagia with odynophasia and foreign body sensation. Gastroenterology was consulted and he underwent EGD on 7/11. He was found to have esophageal dysmotility, perhaps achalasia, which was felt to increase his risk for aspiration. He did not have esophageal foreign body.  Gastroenterology recommended that he work with speech therapy, however he declined. He has speech therapy and other services available through the [place], and he wanted to return there for ongoing management of his symptoms. He was evaluated at bedside for dysphagia  during that admission, and declined instrumental swallow study.     Pertinent Vitals Pain Assessment: Faces Faces Pain Scale: No hurt  SLP Plan  Continue with current plan of care    Recommendations Diet recommendations: NPO              Oral Care Recommendations:  (QID) Follow up Recommendations:  (tba) Plan: Continue with current plan of care    [place], CCC-SLP [phone]   [name] [name] 05/01/2014, 4:09 PM

## 2014-05-02 ENCOUNTER — Inpatient Hospital Stay (HOSPITAL_COMMUNITY): Payer: Non-veteran care

## 2014-05-02 DIAGNOSIS — A419 Sepsis, unspecified organism: Secondary | ICD-10-CM

## 2014-05-02 DIAGNOSIS — E43 Unspecified severe protein-calorie malnutrition: Secondary | ICD-10-CM

## 2014-05-02 DIAGNOSIS — R740 Nonspecific elevation of levels of transaminase and lactic acid dehydrogenase [LDH]: Secondary | ICD-10-CM

## 2014-05-02 DIAGNOSIS — R7401 Elevation of levels of liver transaminase levels: Secondary | ICD-10-CM

## 2014-05-02 LAB — CBC WITH DIFFERENTIAL/PLATELET
BASOS ABS: 0 10*3/uL (ref 0.0–0.1)
Basophils Relative: 0 % (ref 0–1)
EOS ABS: 0 10*3/uL (ref 0.0–0.7)
EOS PCT: 0 % (ref 0–5)
HCT: 30.9 % — ABNORMAL LOW (ref 39.0–52.0)
Hemoglobin: 11.2 g/dL — ABNORMAL LOW (ref 13.0–17.0)
Lymphocytes Relative: 4 % — ABNORMAL LOW (ref 12–46)
Lymphs Abs: 0.7 10*3/uL (ref 0.7–4.0)
MCH: 30.5 pg (ref 26.0–34.0)
MCHC: 36.2 g/dL — AB (ref 30.0–36.0)
MCV: 84.2 fL (ref 78.0–100.0)
MONO ABS: 0.8 10*3/uL (ref 0.1–1.0)
Monocytes Relative: 5 % (ref 3–12)
Neutro Abs: 13.5 10*3/uL — ABNORMAL HIGH (ref 1.7–7.7)
Neutrophils Relative %: 91 % — ABNORMAL HIGH (ref 43–77)
Platelets: 153 10*3/uL (ref 150–400)
RBC: 3.67 MIL/uL — ABNORMAL LOW (ref 4.22–5.81)
RDW: 15.9 % — AB (ref 11.5–15.5)
WBC: 14.9 10*3/uL — ABNORMAL HIGH (ref 4.0–10.5)

## 2014-05-02 LAB — COMPREHENSIVE METABOLIC PANEL
ALK PHOS: 81 U/L (ref 39–117)
ALT: 51 U/L (ref 0–53)
AST: 62 U/L — AB (ref 0–37)
Albumin: 1.9 g/dL — ABNORMAL LOW (ref 3.5–5.2)
Anion gap: 18 — ABNORMAL HIGH (ref 5–15)
BILIRUBIN TOTAL: 0.7 mg/dL (ref 0.3–1.2)
BUN: 102 mg/dL — ABNORMAL HIGH (ref 6–23)
CO2: 22 meq/L (ref 19–32)
CREATININE: 5.34 mg/dL — AB (ref 0.50–1.35)
Calcium: 7.2 mg/dL — ABNORMAL LOW (ref 8.4–10.5)
Chloride: 100 mEq/L (ref 96–112)
GFR calc Af Amer: 11 mL/min — ABNORMAL LOW (ref >90)
GFR, EST NON AFRICAN AMERICAN: 9 mL/min — AB (ref >90)
Glucose, Bld: 132 mg/dL — ABNORMAL HIGH (ref 70–99)
Potassium: 4.1 mEq/L (ref 3.7–5.3)
SODIUM: 140 meq/L (ref 137–147)
Total Protein: 5.8 g/dL — ABNORMAL LOW (ref 6.0–8.3)

## 2014-05-02 LAB — CULTURE, BLOOD (ROUTINE X 2)

## 2014-05-02 LAB — INFLUENZA PANEL BY PCR (TYPE A & B)
H1N1 flu by pcr: NOT DETECTED
INFLBPCR: NEGATIVE
Influenza A By PCR: NEGATIVE

## 2014-05-02 LAB — GLUCOSE, CAPILLARY
GLUCOSE-CAPILLARY: 114 mg/dL — AB (ref 70–99)
GLUCOSE-CAPILLARY: 140 mg/dL — AB (ref 70–99)
GLUCOSE-CAPILLARY: 83 mg/dL (ref 70–99)
Glucose-Capillary: 97 mg/dL (ref 70–99)

## 2014-05-02 LAB — MAGNESIUM: Magnesium: 2.1 mg/dL (ref 1.5–2.5)

## 2014-05-02 NOTE — Care Management Note (Addendum)
  Page 1 of 1   24-May-2014     10:09:00 AM CARE MANAGEMENT NOTE 2014-05-24  Patient:  [name], [name]   Account Number:  [id number]  Date Initiated:  04/28/2014  Documentation initiated by:  Marvetta Gibbons  Subjective/Objective Assessment:   Pt admitted with sepsis secondary to PNA     Action/Plan:   PTA pt lived at home with son and grandson- is active with [facility] for [place] (wound care) and PT- pt will need resumption orders if returns home at discharge   Anticipated DC Date:  05-24-2014   Anticipated DC Plan:  [place]  CM consult      [facility] Choice  Resumption Of Svcs/PTA Provider   Choice offered to / List presented to:             [place]   Status of service:  Completed, signed off Medicare Important Message given?   (If response is "NO", the following Medicare IM given date fields will be blank) Date Medicare IM given:   Medicare IM given by:   Date Additional Medicare IM given:   Additional Medicare IM given by:    Discharge Disposition:  EXPIRED  Per UR Regulation:  Reviewed for med. necessity/level of care/duration of stay  If discussed at [place] of Stay Meetings, dates discussed:   05/02/2014    Comments:   05/02/14- 1030- Marvetta Gibbons RN, BSN [phone] Pt renal function worsening- MD to have renal to consult- PT recommending SNF at discharge- will continue to follow- pt may need PC consult to assist with [place]-  discussed with MD and PA

## 2014-05-02 NOTE — Progress Notes (Signed)
Notified Dr. Sherral Hammers at [phone] that radiology was unsuccessful with NG tube insertion and that per radiology pt would need to be sedated.

## 2014-05-02 NOTE — Progress Notes (Signed)
Spoke with Tylene Fantasia NP about panda tube and the pt inability to follow command to swallow to help with placement of the tube. Ok to hold off in placing tube and will re-evaluate in AM. Will continue to monitor

## 2014-05-02 NOTE — Progress Notes (Signed)
Attempted unsuccessfully to insert NG tube 2 times.

## 2014-05-02 NOTE — Consult Note (Signed)
[name] is a 80 y.o. male with CKD with creatinine of about 2 mg/dl.  He has a  history of hypertension, diabetes, chronic right-sided heart failure, recurrent aspiration pneumonia, possible achalasia, chronic venous insufficiency, chronic kidney disease. His baseline status is limited according to notes living at home with a lot of assistance with ADLs.  The patient presented with complaints of generalized weakness and congestion. He was admitted with PNA and worsening renal function with BUN 73 and creat 3.0 on admission.  He is now 8.6 liters positive and weight up 3.8 Kg. Despite this treatment BUN today is 69 and creat 3.2.  Renal was asked to see.  Renal ultrasound done 7/14 revealed a 15 cm R and 14.2 cm L with bilat cysts and no hydro.  Recent cardiac echo reveled TR and elevated right sided pressures.  Past Medical History  Diagnosis Date  . Pneumonia   . Hypertension   . Hypercholesteremia   . Diabetes mellitus   . Gout   . Irregular heart beat   . Kidney stone   . Lymphoma     s/p partial gastrectomy  . CHF (congestive heart failure)    Past Surgical History  Procedure Laterality Date  . Partial gastrectomy  1994    for lymphoma  . Hernia repair    . Joint replacement    . Back surgery    . Esophagogastroduodenoscopy N/A 10/28/2012    Procedure: ESOPHAGOGASTRODUODENOSCOPY (EGD);  Surgeon: Lear Ng, MD;  Location: [facility];  Service: Endoscopy;  Laterality: N/A;  . Esophagogastroduodenoscopy Left 03/11/2014    Procedure: ESOPHAGOGASTRODUODENOSCOPY (EGD);  Surgeon: Arta Silence, MD;  Location: [facility];  Service: Endoscopy;  Laterality: Left;   Social History:  reports that he has never smoked. He has never used smokeless tobacco. He reports that he does not drink alcohol or use illicit drugs. Allergies: No Known Allergies History reviewed. No pertinent family history.  Medications:  Prior to Admission:  Prescriptions prior to admission   Medication Sig Dispense Refill  . albuterol (PROVENTIL HFA;VENTOLIN HFA) 108 (90 BASE) MCG/ACT inhaler Inhale 2 puffs into the lungs every 6 (six) hours as needed for wheezing or shortness of breath.      . allopurinol (ZYLOPRIM) 100 MG tablet Take 100 mg by mouth daily.      Marland Kitchen aspirin EC 81 MG tablet Take 1 tablet (81 mg total) by mouth daily.  30 tablet  3  . carvedilol (COREG) 12.5 MG tablet Take 12.5 mg by mouth 2 (two) times daily with a meal.      . gabapentin (NEURONTIN) 100 MG capsule Take 100 mg by mouth 2 (two) times daily.      . hydrALAZINE (APRESOLINE) 50 MG tablet Take 50 mg by mouth 3 (three) times daily.      Marland Kitchen HYDROcodone-acetaminophen (NORCO/VICODIN) 5-325 MG per tablet Take 1 tablet by mouth every 4 (four) hours as needed for moderate pain.      Marland Kitchen loperamide (IMODIUM) 2 MG capsule Take 2 mg by mouth daily. Take with food      . mirtazapine (REMERON) 15 MG tablet Take 7.5 mg by mouth at bedtime.      . niacin (SLO-NIACIN) 500 MG tablet Take 1,000 mg by mouth at bedtime. Take after a low fat snack      . omeprazole (PRILOSEC) 20 MG capsule Take 20 mg by mouth daily.      . sertraline (ZOLOFT) 100 MG tablet Take 200 mg by mouth at  bedtime.       . sucralfate (CARAFATE) 1 G tablet Take 1 g by mouth daily.      . vitamin B-12 (CYANOCOBALAMIN) 100 MCG tablet Take 100 mcg by mouth daily.       Scheduled: . albuterol  2.5 mg Nebulization BID  . antiseptic oral rinse  7 mL Mouth Rinse q12n4p  . chlorhexidine  15 mL Mouth Rinse BID  . heparin  5,000 Units Subcutaneous 3 times per day  . insulin aspart  0-15 Units Subcutaneous Q6H  . [START ON 05-12-2014] levofloxacin (LEVAQUIN) IV  500 mg Intravenous Q48H   ROS: not obtainable due to his clinical state Blood pressure 94/50, pulse 87, temperature 98.6 F (37 C), temperature source Axillary, resp. rate 25, height 6' 3"  (1.905 m), weight 90.1 kg (198 lb 10.2 oz), SpO2 96.00%.  General appearance: debilitated and emaciated Head:  Normocephalic, without obvious abnormality, atraumatic temporal and periorbital wa Resp: rhonchorous sounds Chest wall: no tenderness Cardio: regular rate and rhythm, S1, S2 normal, no murmur, click, rub or gallop GI: soft mild tenderness Extremities: RLE wrapped, tr edema Skin: RLE wrapped Neurologic: Moans, not conversant  Results for orders placed during the hospital encounter of 04/15/2014 (from the past 48 hour(s))  GLUCOSE, CAPILLARY     Status: Abnormal   Collection Time    04/30/14  5:31 PM      Result Value Ref Range   Glucose-Capillary 142 (*) 70 - 99 mg/dL   Comment 1 Documented in Chart     Comment 2 Notify RN    GLUCOSE, CAPILLARY     Status: Abnormal   Collection Time    04/30/14 11:34 PM      Result Value Ref Range   Glucose-Capillary 156 (*) 70 - 99 mg/dL   Comment 1 Notify RN    COMPREHENSIVE METABOLIC PANEL     Status: Abnormal   Collection Time    05/01/14  3:45 AM      Result Value Ref Range   Sodium 138  137 - 147 mEq/L   Potassium 4.7  3.7 - 5.3 mEq/L   Chloride 101  96 - 112 mEq/L   CO2 16 (*) 19 - 32 mEq/L   Glucose, Bld 182 (*) 70 - 99 mg/dL   BUN 98 (*) 6 - 23 mg/dL   Creatinine, Ser 5.06 (*) 0.50 - 1.35 mg/dL   Calcium 7.8 (*) 8.4 - 10.5 mg/dL   Total Protein 6.0  6.0 - 8.3 g/dL   Albumin 2.0 (*) 3.5 - 5.2 g/dL   AST 123 (*) 0 - 37 U/L   ALT 66 (*) 0 - 53 U/L   Alkaline Phosphatase 85  39 - 117 U/L   Total Bilirubin 0.6  0.3 - 1.2 mg/dL   GFR calc non Af Amer 10 (*) >90 mL/min   GFR calc Af Amer 11 (*) >90 mL/min   Comment: (NOTE)     The eGFR has been calculated using the CKD EPI equation.     This calculation has not been validated in all clinical situations.     eGFR's persistently <90 mL/min signify possible Chronic Kidney     Disease.   Anion gap 21 (*) 5 - 15   Comment: RESULT CHECKED  CBC WITH DIFFERENTIAL     Status: Abnormal   Collection Time    05/01/14  3:45 AM      Result Value Ref Range   WBC 12.1 (*) 4.0 - 10.5 K/uL  RBC  3.56 (*) 4.22 - 5.81 MIL/uL   Hemoglobin 11.0 (*) 13.0 - 17.0 g/dL   HCT 31.2 (*) 39.0 - 52.0 %   MCV 87.6  78.0 - 100.0 fL   MCH 30.9  26.0 - 34.0 pg   MCHC 35.3  30.0 - 36.0 g/dL   RDW 16.0 (*) 11.5 - 15.5 %   Platelets 177  150 - 400 K/uL   Neutrophils Relative % 93 (*) 43 - 77 %   Neutro Abs 11.2 (*) 1.7 - 7.7 K/uL   Lymphocytes Relative 4 (*) 12 - 46 %   Lymphs Abs 0.5 (*) 0.7 - 4.0 K/uL   Monocytes Relative 3  3 - 12 %   Monocytes Absolute 0.4  0.1 - 1.0 K/uL   Eosinophils Relative 0  0 - 5 %   Eosinophils Absolute 0.0  0.0 - 0.7 K/uL   Basophils Relative 0  0 - 1 %   Basophils Absolute 0.0  0.0 - 0.1 K/uL  MAGNESIUM     Status: None   Collection Time    05/01/14  3:45 AM      Result Value Ref Range   Magnesium 2.1  1.5 - 2.5 mg/dL  GLUCOSE, CAPILLARY     Status: Abnormal   Collection Time    05/01/14  5:57 AM      Result Value Ref Range   Glucose-Capillary 171 (*) 70 - 99 mg/dL   Comment 1 Notify RN    GLUCOSE, CAPILLARY     Status: Abnormal   Collection Time    05/01/14 11:36 AM      Result Value Ref Range   Glucose-Capillary 172 (*) 70 - 99 mg/dL   Comment 1 Notify RN     Comment 2 Documented in Chart    GLUCOSE, CAPILLARY     Status: Abnormal   Collection Time    05/01/14  5:42 PM      Result Value Ref Range   Glucose-Capillary 126 (*) 70 - 99 mg/dL  INFLUENZA PANEL BY PCR (TYPE A & B, H1N1)     Status: None   Collection Time    05/01/14  7:30 PM      Result Value Ref Range   Influenza A By PCR NEGATIVE  NEGATIVE   Influenza B By PCR NEGATIVE  NEGATIVE   H1N1 flu by pcr NOT DETECTED  NOT DETECTED   Comment:            The Xpert Flu assay (FDA approved for     nasal aspirates or washes and     nasopharyngeal swab specimens), is     intended as an aid in the diagnosis of     influenza and should not be used as     a sole basis for treatment.  GLUCOSE, CAPILLARY     Status: Abnormal   Collection Time    05/02/14 12:05 AM      Result Value Ref Range    Glucose-Capillary 140 (*) 70 - 99 mg/dL  COMPREHENSIVE METABOLIC PANEL     Status: Abnormal   Collection Time    05/02/14  2:37 AM      Result Value Ref Range   Sodium 140  137 - 147 mEq/L   Potassium 4.1  3.7 - 5.3 mEq/L   Chloride 100  96 - 112 mEq/L   CO2 22  19 - 32 mEq/L   Glucose, Bld 132 (*) 70 - 99 mg/dL   BUN 102 (*)  6 - 23 mg/dL   Creatinine, Ser 5.34 (*) 0.50 - 1.35 mg/dL   Calcium 7.2 (*) 8.4 - 10.5 mg/dL   Total Protein 5.8 (*) 6.0 - 8.3 g/dL   Albumin 1.9 (*) 3.5 - 5.2 g/dL   AST 62 (*) 0 - 37 U/L   ALT 51  0 - 53 U/L   Alkaline Phosphatase 81  39 - 117 U/L   Total Bilirubin 0.7  0.3 - 1.2 mg/dL   GFR calc non Af Amer 9 (*) >90 mL/min   GFR calc Af Amer 11 (*) >90 mL/min   Comment: (NOTE)     The eGFR has been calculated using the CKD EPI equation.     This calculation has not been validated in all clinical situations.     eGFR's persistently <90 mL/min signify possible Chronic Kidney     Disease.   Anion gap 18 (*) 5 - 15  CBC WITH DIFFERENTIAL     Status: Abnormal   Collection Time    05/02/14  2:37 AM      Result Value Ref Range   WBC 14.9 (*) 4.0 - 10.5 K/uL   RBC 3.67 (*) 4.22 - 5.81 MIL/uL   Hemoglobin 11.2 (*) 13.0 - 17.0 g/dL   HCT 30.9 (*) 39.0 - 52.0 %   MCV 84.2  78.0 - 100.0 fL   MCH 30.5  26.0 - 34.0 pg   MCHC 36.2 (*) 30.0 - 36.0 g/dL   RDW 15.9 (*) 11.5 - 15.5 %   Platelets 153  150 - 400 K/uL   Neutrophils Relative % 91 (*) 43 - 77 %   Neutro Abs 13.5 (*) 1.7 - 7.7 K/uL   Lymphocytes Relative 4 (*) 12 - 46 %   Lymphs Abs 0.7  0.7 - 4.0 K/uL   Monocytes Relative 5  3 - 12 %   Monocytes Absolute 0.8  0.1 - 1.0 K/uL   Eosinophils Relative 0  0 - 5 %   Eosinophils Absolute 0.0  0.0 - 0.7 K/uL   Basophils Relative 0  0 - 1 %   Basophils Absolute 0.0  0.0 - 0.1 K/uL  MAGNESIUM     Status: None   Collection Time    05/02/14  2:37 AM      Result Value Ref Range   Magnesium 2.1  1.5 - 2.5 mg/dL  GLUCOSE, CAPILLARY     Status: Abnormal    Collection Time    05/02/14  6:06 AM      Result Value Ref Range   Glucose-Capillary 114 (*) 70 - 99 mg/dL  GLUCOSE, CAPILLARY     Status: None   Collection Time    05/02/14 11:38 AM      Result Value Ref Range   Glucose-Capillary 97  70 - 99 mg/dL   Comment 1 Documented in Chart     Comment 2 Notify RN     No results found.  Assessment:  1 Oliguric AKI worsening, prob hemodynamically mediated 2  CKD 3 3 Hypotension with hx of hypertension  Plan: 1 Continue supportive therapy with fluids 2 Improve hemodynamics to optimize renal perfusion.  He may need more volume with a degree of pulmonary hypeertension. 3 Patient does not appear to be an appropriate dialysis candidate given his current clinical state:however, I am uncertain of his baseline status.  Jacalynn Buzzell C 05/02/2014, 3:23 PM

## 2014-05-02 NOTE — Progress Notes (Signed)
[place] TEAM 1 - Stepdown/ICU TEAM Progress Note  HO PARISI CBJ:[id number] DOB: [birth year] DOA: 04/21/2014 PCP: Leola Brazil, MD  Admit HPI / Brief Narrative: [name] is a 80 y.o.PM PMHx  hypertension, diabetes, chronic right-sided heart failure, recurrent aspiration pneumonia, possible achalasia, chronic venous insufficiency, chronic kidney disease.  The patient presented with complaints of generalized weakness and congestion. At the time of my evaluation patient continues she repeatedly say "he is feeling cold"other than that he did not have any other complaint. History was obtained from ED documentation. Multiple attempt to reach family was unsuccessful.  Reportedly family called EMS as the patient was having progressively worsening weakness. He was reported to have condition for a while.  Patient also reported to ER physician that he has been hurting all over. Patient denied any abdominal pain to me no nausea no vomiting.  The patient is coming from home. And at his baseline dependent for most of his ADL. 8/28 A./O. x1 (does not know where, when, why), will not cooperate with exam will only screen that he hurts all over and then will fall asleep and then repeats the cycle.  HPI/Subjective: 9/1 A./O. x1 (does not know where, when, why), will not cooperate with exam. Answers any question with I am alright.    Assessment/Plan: Sepsis/Pneumonia aspiration? -Continue current antibiotics -Solu-Medrol 60 mg daily -Flutter valve q 4hr while awake -Mucinex DM BID -NTS BID -Respiratory therapy has started patient on acetylcysteine nebulizers BID to help breakup the mucus. -N.p.o. until patient passes swallow test (unable to pass on 8/29) -PT recommends SNF; OT consult pending  Hypothermia -Place bear hugger maintain body temperature> 16W  Chronic systolic CHF -Maintain hemoglobin> 8.0 -Patient's BP to soft at this moment for any BP medication. Hold all home BP  meds -Daily weights; admission weight =86.3 kg    9/1 bed weight= 90.1 kg  Pulmonary hypertension -See chronic systolic CHF  Atrial fibrillation (new onset)  -Most likely secondary to sepsis -TSH within normal limit -Less likely is PE, alcohol, or patient's mild anemia  Transaminitis - hepatic cirrhosis  -Cirrhosis suggested on CT abdom - family states pt was "treated for liver cancer a long time ago" - further hx not available at present - follow trend - LFTs are improving   Dementia -Patient clearly demented and per RN she spoke with son and son concurred that patient has element of dementia.  Chronic pain syndrome - Unfortunately patient's creatinine clearance is poor, unsure patient is able to swallow safely i.e. Aspirating -Morphine IV 1 mg q 4hr PRN ; closely monitor BP  Diabetes type 2 -2/23 hemoglobin A1c= 6.4 -2/23 lipid panel within ADA guidelines except for mildly elevated TG -Continue moderate SSI  Acute on chronic renal failure -Creatinine clearance= 23 mg/dL -Normal saline at 42m/hr; watch for fluid overload -Consult nephrology as patient's renal failure has not improved; is patient candidate for HD?  Bilateral lower extremity venous stasis ulcers -Continue current wound care regimen -Consult wound nurse -Currently does not look infected however current antibiotic regimen should cover most organisms  Severe malnutrition -Will need to contact family if patient cannot consume PO nutrition by Monday and discuss placement of NG tube initially and possible PEG tube further down the road if no improvement -IR  To place  Panda tube today. Several attempts have been made at placing NG tube unsuccessfully    Code Status: FULL Family Communication: no family present at time of exam Disposition Plan: Resolution pneumonia. Palliative  care?    Consultants: NA  Procedure/Significant Events: 7/15 echocardiogram; LVEF= 50% to 55%. - Tricuspid valve moderate  regurgitation. - Pulmonary arteries PA peak pressure: 45 mm Hg (S). 8/27 acute abdominal series; Increased opacity left mid/lower lung zones, suspicious for pneumonia.  8/27 CT abdomen pelvis without contrast;Diffuse patchy airspace disease in the lungs with small pleural effusions suggesting edema, aspiration, or pneumonia. -evidence of hepatic cirrhosis, multiple renal cysts, pancreatic atrophy, postoperative abdominal hernia repair,     Culture 8/27 urine pending 8/27 blood x2 pending 8/27 respiratory positive moderate gram-positive cocci in pairs in chains and clusters 8/28 MRSA by PCR negative 8/28 respiratory virus panel pending   Antibiotics: Zosyn 8/27>> stopped 8/29 Vancomycin 8/27>> stopped 8/30 Unasyn 8/30 >> 8/31  Levaquin 8/31 >>   DVT prophylaxis: Heparin subcutaneous  Devices    LINES / TUBES:      Continuous Infusions: .  sodium bicarbonate 150 mEq in sterile water 1000 mL infusion 125 mL/hr at 05/02/14 0612    Objective: VITAL SIGNS: Temp: 97.7 F (36.5 C) (09/01 0820) Temp src: Axillary (09/01 0820) BP: 100/51 mmHg (09/01 0820) Pulse Rate: 73 (09/01 0820) SPO2; 98% on 2 L O2 via [place] FIO2:   Intake/Output Summary (Last 24 hours) at 05/02/14 1015 Last data filed at 05/02/14 0700  Gross per 24 hour  Intake 3457.5 ml  Output     87 ml  Net 3370.5 ml     Exam: General: A./O. x1 (does not know where, when, why), No acute respiratory distress Lungs: Diffuse walls, thick yellow copious amounts sputum, positive diffuse crackles Cardiovascular: Irregular irregular rhythm and rate, without murmur gallop or rub normal S1 and S2 Abdomen: Nontender, nondistended, soft, bowel sounds positive, no rebound, no ascites, no appreciable mass Extremities: No significant cyanosis, clubbing. Bilateral lower extremity venous stasis ulcers covered clean negative signs infection.    Data Reviewed: Basic Metabolic Panel:  Recent Labs Lab 04/29/14 0246  04/30/14 0245 04/30/14 0821 05/01/14 0345 05/02/14 0237  NA 134* 136* 136* 138 140  K 4.5 5.0 4.7 4.7 4.1  CL 101 102 101 101 100  CO2 12* 10* 12* 16* 22  GLUCOSE 140* 180* 184* 182* 132*  BUN 80* 89* 92* 98* 102*  CREATININE 3.36* 4.02* 4.35* 5.06* 5.34*  CALCIUM 8.1* 8.4 8.0* 7.8* 7.2*  MG 1.9 2.0  --  2.1 2.1   Liver Function Tests:  Recent Labs Lab 04/28/14 0520 04/29/14 0246 04/30/14 0245 05/01/14 0345 05/02/14 0237  AST 229* 308* 224* 123* 62*  ALT 62* 76* 76* 66* 51  ALKPHOS 116 112 104 85 81  BILITOT 0.5 0.6 0.6 0.6 0.7  PROT 6.3 5.9* 6.2 6.0 5.8*  ALBUMIN 2.1* 1.9* 1.9* 2.0* 1.9*    Recent Labs Lab 04/11/2014 2113  LIPASE 61*   No results found for this basename: AMMONIA,  in the last 168 hours CBC:  Recent Labs Lab 04/28/14 0520 04/29/14 0246 04/30/14 0245 05/01/14 0345 05/02/14 0237  WBC 22.9* 15.8* 15.9* 12.1* 14.9*  NEUTROABS 20.6* 15.4* 15.1* 11.2* 13.5*  HGB 11.0* 10.7* 10.7* 11.0* 11.2*  HCT 32.3* 30.8* 31.2* 31.2* 30.9*  MCV 90.2 87.3 89.4 87.6 84.2  PLT 218 202 199 177 153   Cardiac Enzymes: No results found for this basename: CKTOTAL, CKMB, CKMBINDEX, TROPONINI,  in the last 168 hours BNP (last 3 results)  Recent Labs  11/27/13 1518 04/13/14 2203 04/10/2014 1708  PROBNP 740.1* 7348.0* 21756.0*   CBG:  Recent Labs Lab 05/01/14 0557 05/01/14 1136  05/01/14 1742 05/02/14 0005 05/02/14 0606  GLUCAP 171* 172* 126* 140* 114*    Recent Results (from the past 240 hour(s))  URINE CULTURE     Status: None   Collection Time    04/04/2014  8:21 PM      Result Value Ref Range Status   Specimen Description URINE, CATHETERIZED   Final   Special Requests NONE   Final   Culture  Setup Time     Final   Value: 04/29/2014 22:56     Performed at [facility]     Final   Value: 9,000 COLONIES/ML     Performed at [facility]   Culture     Final   Value: INSIGNIFICANT GROWTH     Performed at [facility]   Report Status 04/29/2014 FINAL   Final  CULTURE, BLOOD (ROUTINE X 2)     Status: None   Collection Time    04/24/2014  9:07 PM      Result Value Ref Range Status   Specimen Description BLOOD ARM RIGHT   Final   Special Requests BOTTLES DRAWN AEROBIC AND ANAEROBIC 5CC   Final   Culture  Setup Time     Final   Value: 04/28/2014 04:34     Performed at [facility]   Culture     Final   Value:        BLOOD CULTURE RECEIVED NO GROWTH TO DATE CULTURE WILL BE HELD FOR 5 DAYS BEFORE ISSUING A FINAL NEGATIVE REPORT     Performed at [facility]   Report Status PENDING   Incomplete  CULTURE, BLOOD (ROUTINE X 2)     Status: None   Collection Time    04/26/2014  9:13 PM      Result Value Ref Range Status   Specimen Description BLOOD FOREARM RIGHT   Final   Special Requests BOTTLES DRAWN AEROBIC ONLY 5CC   Final   Culture  Setup Time     Final   Value: 04/28/2014 03:27     Performed at [facility]   Culture     Final   Value: MICROCOCCUS SPECIES     Note: Standardized susceptibility testing for this organism is not available.     Note: Gram Stain Report Called to,Read Back By and Verified With: LEE WORLEY 04/30/14 @ 9:40PM BY RUSCOE A.     Performed at [facility]   Report Status 05/02/2014 FINAL   Final  CULTURE, RESPIRATORY (NON-EXPECTORATED)     Status: None   Collection Time    04/28/14 12:57 AM      Result Value Ref Range Status   Specimen Description TRACHEAL ASPIRATE   Final   Special Requests NONE   Final   Gram Stain     Final   Value: MODERATE WBC PRESENT,BOTH PMN AND MONONUCLEAR     FEW SQUAMOUS EPITHELIAL CELLS PRESENT     MODERATE GRAM POSITIVE COCCI     IN PAIRS IN CHAINS IN CLUSTERS RARE GRAM POSITIVE RODS     RARE GRAM NEGATIVE RODS   Culture     Final   Value: MODERATE STAPHYLOCOCCUS AUREUS     Note: RIFAMPIN AND GENTAMICIN SHOULD NOT BE USED AS SINGLE DRUGS FOR TREATMENT OF STAPH INFECTIONS. This organism is presumed to be  Clindamycin resistant based on detection of inducible Clindamycin resistance.     Performed at [facility]   Report  Status 05/01/2014 FINAL   Final   Organism ID, Bacteria STAPHYLOCOCCUS AUREUS   Final  MRSA PCR SCREENING     Status: None   Collection Time    04/28/14 12:58 AM      Result Value Ref Range Status   MRSA by PCR NEGATIVE  NEGATIVE Final   Comment:            The GeneXpert MRSA Assay (FDA     approved for NASAL specimens     only), is one component of a     comprehensive MRSA colonization     surveillance program. It is not     intended to diagnose MRSA     infection nor to guide or     monitor treatment for     MRSA infections.  RESPIRATORY VIRUS PANEL     Status: None   Collection Time    04/28/14  6:54 PM      Result Value Ref Range Status   Source - RVPAN NASAL SWAB   Corrected   Comment: CORRECTED ON 08/31 AT 2054: PREVIOUSLY REPORTED AS NASAL SWAB   Respiratory Syncytial Virus A NOT DETECTED   Final   Respiratory Syncytial Virus B NOT DETECTED   Final   Influenza A NOT DETECTED   Final   Influenza B NOT DETECTED   Final   Parainfluenza 1 NOT DETECTED   Final   Parainfluenza 2 NOT DETECTED   Final   Parainfluenza 3 NOT DETECTED   Final   Metapneumovirus NOT DETECTED   Final   Rhinovirus NOT DETECTED   Final   Adenovirus NOT DETECTED   Final   Influenza A H1 NOT DETECTED   Final   Influenza A H3 NOT DETECTED   Final   Comment: (NOTE)           Normal Reference Range for each Analyte: NOT DETECTED     Testing performed using the Luminex xTAG Respiratory Viral Panel test     kit.     The analytical performance characteristics of this assay have been     determined by [facility].  The modifications have not been     cleared or approved by the FDA. This assay has been validated pursuant     to the CLIA regulations and is used for clinical purposes.     Performed at [facility]     Studies:  Recent x-ray studies have been  reviewed in detail by the Attending Physician  Scheduled Meds:  Scheduled Meds: . albuterol  2.5 mg Nebulization BID  . antiseptic oral rinse  7 mL Mouth Rinse q12n4p  . chlorhexidine  15 mL Mouth Rinse BID  . heparin  5,000 Units Subcutaneous 3 times per day  . insulin aspart  0-15 Units Subcutaneous Q6H  . [START ON 05-17-14] levofloxacin (LEVAQUIN) IV  500 mg Intravenous Q48H    Time spent on care of this patient: 40 mins   Allie Bossier , MD   [facility] Office  [phone] Pager - [phone]  On-Call/Text Page:      [email or web address]      password [email or web address]  If 7PM-7AM, please contact night-coverage [email or web address] Password [email or web address] 05/02/2014, 10:15 AM   LOS: 5 days

## 2014-05-02 NOTE — Progress Notes (Signed)
Speech Language Pathology Treatment: Dysphagia  Patient Details Name: [name] MRN: [id number] DOB: [birth year] Today's Date: 05/02/2014 Time: 1200-1210 SLP Time Calculation (min): 10 min  Assessment / Plan / Recommendation Clinical Impression  Pt remains unable to maintain arousal.  Provided oral care with only unintelligible vocalizations elicited from pt.  Pt with open mouth posture; audible secretions at level of larynx.  Limited water swabbed in oral cavity evoked no oral manipulation and no spontaneous swallow response. Secretions suctioned and session discontinued.    Recommend consideration of Palliative Medicine to assist family in decision-making/GOC.   SLP to sign off given lack of progress.     HPI HPI: 80 y.o. male with PMHx recurrent pneumonia, possible achalasia,  malnutrition presented with c/o generalized weakness and congestion. Dx of sepsis/pneumonia.  Pt has hx of esophageal stricture and food impaction.  He was hospitalized in July of 2015 with pneumonia, dysphagia with odynophasia and foreign body sensation. Gastroenterology was consulted and he underwent EGD on 7/11. He was found to have esophageal dysmotility, perhaps achalasia, which was felt to increase his risk for aspiration. He did not have esophageal foreign body.  Gastroenterology recommended that he work with speech therapy, however he declined. He has speech therapy and other services available through the [place], and he wanted to return there for ongoing management of his symptoms. He was evaluated at bedside for dysphagia during that admission, and declined instrumental swallow study.     Pertinent Vitals Pain Assessment: Faces Faces Pain Scale: No hurt  SLP Plan  Discharge SLP treatment due to (comment) (no progress)    Recommendations Diet recommendations: NPO              Oral Care Recommendations:  (QID) Plan: Discharge SLP treatment due to (comment) (no progress)        Juan Quam  Laurice 05/02/2014, 2:02 PM

## 2014-05-02 NOTE — Progress Notes (Signed)
Utilization review completed.  

## 2014-05-02 DEATH — deceased

## 2014-05-03 LAB — CBC WITH DIFFERENTIAL/PLATELET
BASOS ABS: 0 10*3/uL (ref 0.0–0.1)
Basophils Relative: 0 % (ref 0–1)
Eosinophils Absolute: 0 10*3/uL (ref 0.0–0.7)
Eosinophils Relative: 0 % (ref 0–5)
HCT: 29 % — ABNORMAL LOW (ref 39.0–52.0)
Hemoglobin: 10.2 g/dL — ABNORMAL LOW (ref 13.0–17.0)
LYMPHS ABS: 1.1 10*3/uL (ref 0.7–4.0)
LYMPHS PCT: 6 % — AB (ref 12–46)
MCH: 30.3 pg (ref 26.0–34.0)
MCHC: 35.2 g/dL (ref 30.0–36.0)
MCV: 86.1 fL (ref 78.0–100.0)
Monocytes Absolute: 1.1 10*3/uL — ABNORMAL HIGH (ref 0.1–1.0)
Monocytes Relative: 5 % (ref 3–12)
NEUTROS ABS: 17.9 10*3/uL — AB (ref 1.7–7.7)
NEUTROS PCT: 89 % — AB (ref 43–77)
PLATELETS: 123 10*3/uL — AB (ref 150–400)
RBC: 3.37 MIL/uL — AB (ref 4.22–5.81)
RDW: 16.1 % — AB (ref 11.5–15.5)
WBC: 20.1 10*3/uL — ABNORMAL HIGH (ref 4.0–10.5)

## 2014-05-03 LAB — GLUCOSE, CAPILLARY
GLUCOSE-CAPILLARY: 104 mg/dL — AB (ref 70–99)
Glucose-Capillary: 92 mg/dL (ref 70–99)

## 2014-05-03 LAB — COMPREHENSIVE METABOLIC PANEL
ALT: 61 U/L — ABNORMAL HIGH (ref 0–53)
ANION GAP: 21 — AB (ref 5–15)
AST: 179 U/L — AB (ref 0–37)
Albumin: 1.8 g/dL — ABNORMAL LOW (ref 3.5–5.2)
Alkaline Phosphatase: 79 U/L (ref 39–117)
BILIRUBIN TOTAL: 0.9 mg/dL (ref 0.3–1.2)
BUN: 101 mg/dL — AB (ref 6–23)
CHLORIDE: 89 meq/L — AB (ref 96–112)
CO2: 29 meq/L (ref 19–32)
Calcium: 6.5 mg/dL — ABNORMAL LOW (ref 8.4–10.5)
Creatinine, Ser: 5.64 mg/dL — ABNORMAL HIGH (ref 0.50–1.35)
GFR calc Af Amer: 10 mL/min — ABNORMAL LOW (ref >90)
GFR, EST NON AFRICAN AMERICAN: 9 mL/min — AB (ref >90)
Glucose, Bld: 91 mg/dL (ref 70–99)
Potassium: 4.1 mEq/L (ref 3.7–5.3)
Sodium: 139 mEq/L (ref 137–147)
Total Protein: 5.2 g/dL — ABNORMAL LOW (ref 6.0–8.3)

## 2014-05-03 LAB — MAGNESIUM: MAGNESIUM: 1.8 mg/dL (ref 1.5–2.5)

## 2014-05-03 MED ORDER — SODIUM CHLORIDE 0.9 % IV SOLN
1.0000 mg/h | INTRAVENOUS | Status: DC
  Filled 2014-05-03: qty 10

## 2014-05-03 MED ORDER — SODIUM CHLORIDE 0.9 % IV BOLUS (SEPSIS)
500.0000 mL | Freq: Once | INTRAVENOUS | Status: AC
  Administered 2014-05-03: 500 mL via INTRAVENOUS

## 2014-05-03 MED ORDER — LORAZEPAM 2 MG/ML IJ SOLN
0.5000 mg | Freq: Once | INTRAMUSCULAR | Status: AC
  Administered 2014-05-03: 0.5 mg via INTRAVENOUS

## 2014-05-03 MED ORDER — LORAZEPAM 2 MG/ML IJ SOLN: 1.0000 mg | INTRAMUSCULAR | Status: DC | PRN

## 2014-05-03 MED ORDER — LORAZEPAM 2 MG/ML IJ SOLN
INTRAMUSCULAR | Status: AC
  Filled 2014-05-03: qty 1

## 2014-05-03 MED ORDER — PHENYLEPHRINE HCL 10 MG/ML IJ SOLN
30.0000 ug/min | INTRAVENOUS | Status: DC
  Filled 2014-05-03: qty 1

## 2014-05-03 MED ORDER — ALBUTEROL SULFATE (2.5 MG/3ML) 0.083% IN NEBU: 2.5000 mg | INHALATION_SOLUTION | RESPIRATORY_TRACT | Status: DC | PRN

## 2014-05-03 MED ORDER — MORPHINE SULFATE 2 MG/ML IJ SOLN: 1.0000 mg | INTRAMUSCULAR | Status: DC | PRN

## 2014-05-04 LAB — CULTURE, BLOOD (ROUTINE X 2): Culture: NO GROWTH

## 2014-05-09 ENCOUNTER — Encounter (HOSPITAL_COMMUNITY): Payer: Medicare Other

## 2014-06-01 NOTE — Progress Notes (Signed)
Attempted again to reach the patient's son. No answer. - Soyla Dryer, RN

## 2014-06-01 NOTE — Progress Notes (Signed)
Pt's son arrived to the bedside. Pt's son updated on patient's condition prior to entering room. Pt's son has requested time for family members to come to the hospital before [facility] picks up the body for cremation. Discussed with charge nurse. Pt's son states family members should arrive in an hour.  Will continue to monitor. Roselyn Reef Jeffrie Lofstrom,RN

## 2014-06-01 NOTE — Discharge Summary (Addendum)
Death Summary  [name] INO:[id number] DOB: 01/21/1932 DOA: 2014/05/08  PCP: Leola Brazil, MD  Admit date: 05-08-2014 Date of Death: 05-14-2014  Final Diagnoses:    Community acquired Staph aureus pneumonia - likely due to aspiration   Sepsis due to pneumonia    Acute kidney failure on chronic kidney disease    Transaminitis - chronic hepatic cirrhosis - shock liver    Parox Atrial fibrillation / flutter    HTN    Diabetes mellitus 2   Dysphagia, unspecified   Right heart failure   Acute on chronic congestive heart failure with right ventricular diastolic dysfunction   Probable achalasia   Protein-calorie malnutrition, severe   Pulmonary hypertension   Dementia   Hyponatremia  History of present illness:  80 yo w/ Hx hypertension, diabetes, chronic right-sided heart failure, recurrent aspiration pneumonia, possible achalasia, chronic venous insufficiency, and chronic kidney disease who presented with complaints of generalized weakness and congestion. Reportedly family called EMS as the patient was having progressively worsening weakness.  Hospital Course:  The pt was admitted to the acute units, where it was discovered that he was suffering with a staph aureus pneumonia, felt to most likely be related to aspiration.  Aggressive volume resuscitation was carried out, and the pt was administered broad spectrum antibiotic therapy.  Despite this, his failed to make a significant recovery.  His renal function steadily declined.  Nephrology was consulted, but he was not felt to be an appropriate candidate for HD.  On the 6th day of his hospitalization, the pt had developed a pericardial friction rub, which was believed to be uremic in nature.  His BP began to drop, likely due to a uremic pericardial effusion. The POA was contacted, and was informed that the pt was extremely ill and appeared to be dying.  The POA stated that the pt would not wish to undergo intubation, mechanical  ventilation, CPR, and other aggressive type measures in this situation.  The MD fully agreed that such measures would be futile and inhumane.  The decision was made to transition to comfort focused care.  The pt declined rapidly, and died peacefully approx 10 minutes after this decision was made.     Time of Death:  09:44  SignedJoette Catching T  [facility] 05-14-14, 6:24 PM

## 2014-06-01 NOTE — Progress Notes (Signed)
Pts. Urine output decrease since 43mn pt. Complaining of pain and moans all the time about going to void. Catheter was flushed with no resistance, noted some blood tinged with some clots and sediments in the urine. Bladder was scanned and shows only 30-75ml of urine. Rema Jasmine was notified and made aware of pts. urineoutput this am. Will continue to monitor pt.

## 2014-06-01 NOTE — Progress Notes (Signed)
Patient's heart and respirations ceased at 0944. Two RNs pronounced death. Hiram Gash, RN and [employer]). Pt's son called, voice mail message left with request to return call as soon as possible. Dr. Thereasa Solo on the floor and aware. Charge nurse at bedside and aware. CCMD and E-link notified. Will proceed with proper paperwork, charting, and protocol regarding end of life.  Roselyn Reef Odean Mcelwain,RN

## 2014-06-01 NOTE — Progress Notes (Signed)
Per Dr. Thereasa Solo, give patient 2 mg of morphine IV. Dr. Thereasa Solo aware of 70/38 blood pressure. Will continue to monitor. Roselyn Reef Kimley Apsey,RN

## 2014-06-01 DEATH — deceased

## 2014-07-25 IMAGING — CR DG CHEST 2V
1 series · 1 of 1 positions shown · non-contrast
Comparison: DG CHEST 2 VIEW dated 10/23/2013; DG CHEST 1V PORT dated
04/23/2013; DG CHEST 1V PORT dated 11/12/2012

CLINICAL DATA: Leg swelling.  Chest pain.

EXAM:
CHEST  2 VIEW

[w chest lat]
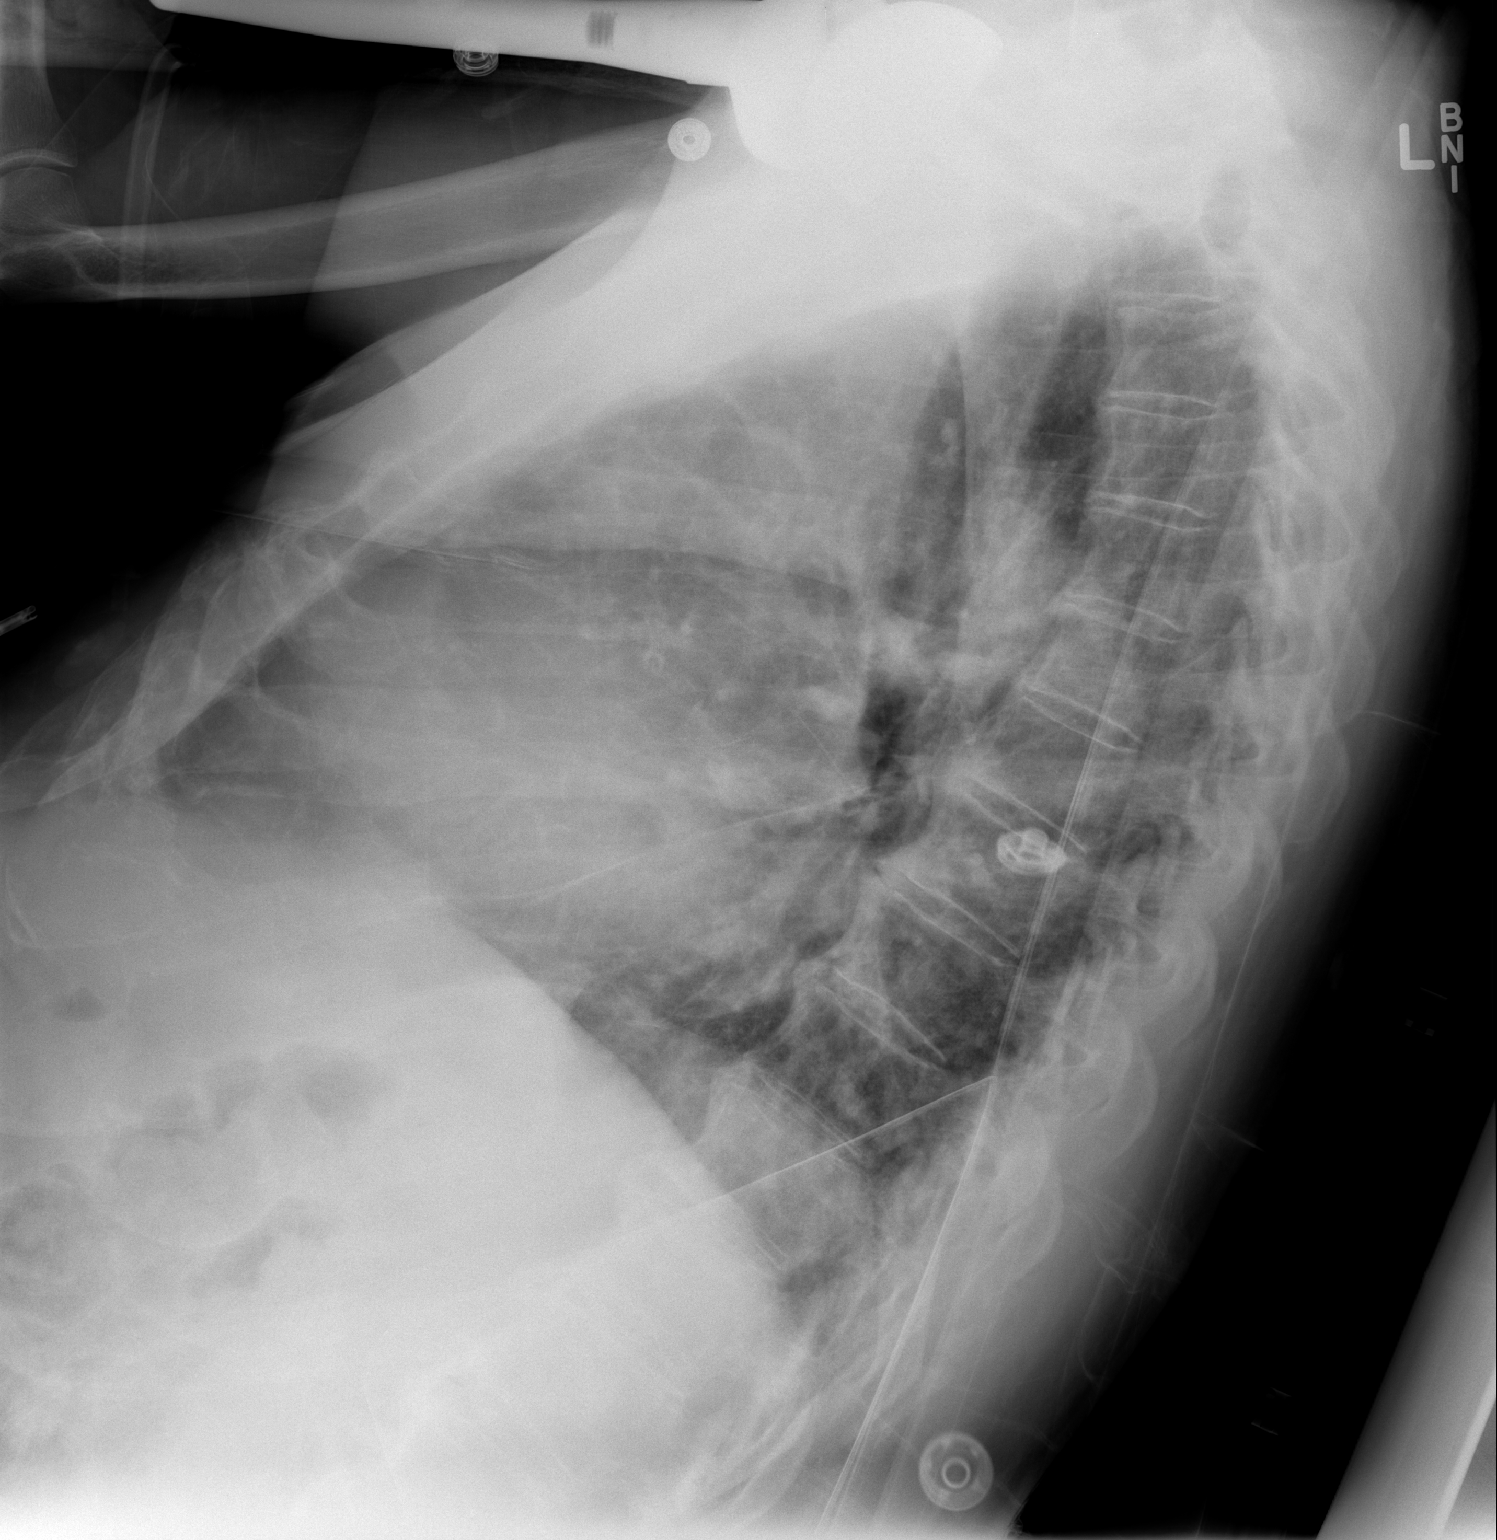

[1 of 1 positions shown; findings below may reference images not displayed]

FINDINGS: Partially visualized reverse right shoulder hemiarthroplasty. The
cardiopericardial silhouette is within normal limits for projection.
There is no airspace disease or pleural effusion. Lateral view is
underpenetrated.
IMPRESSION: No acute cardiopulmonary disease.

## 2014-08-10 ENCOUNTER — Encounter (HOSPITAL_COMMUNITY): Payer: Self-pay | Admitting: Cardiology

## 2018-05-27 ENCOUNTER — Other Ambulatory Visit (HOSPITAL_COMMUNITY): Payer: Self-pay | Admitting: Internal Medicine
